# Patient Record
Sex: Female | Born: 1960
Health system: Southern US, Community
[De-identification: ages and names within clinical notes are randomized; demographics above are authoritative.]

## PROBLEM LIST (undated history)

## (undated) DIAGNOSIS — K7460 Unspecified cirrhosis of liver: Secondary | ICD-10-CM

## (undated) DIAGNOSIS — IMO0002 Reserved for concepts with insufficient information to code with codable children: Secondary | ICD-10-CM

## (undated) DIAGNOSIS — I639 Cerebral infarction, unspecified: Secondary | ICD-10-CM

## (undated) DIAGNOSIS — G8929 Other chronic pain: Secondary | ICD-10-CM

## (undated) DIAGNOSIS — R52 Pain, unspecified: Secondary | ICD-10-CM

## (undated) DIAGNOSIS — G4730 Sleep apnea, unspecified: Secondary | ICD-10-CM

## (undated) DIAGNOSIS — R197 Diarrhea, unspecified: Secondary | ICD-10-CM

## (undated) DIAGNOSIS — J449 Chronic obstructive pulmonary disease, unspecified: Secondary | ICD-10-CM

## (undated) DIAGNOSIS — I10 Essential (primary) hypertension: Secondary | ICD-10-CM

## (undated) DIAGNOSIS — S32000A Wedge compression fracture of unspecified lumbar vertebra, initial encounter for closed fracture: Secondary | ICD-10-CM

## (undated) DIAGNOSIS — F32A Depression, unspecified: Secondary | ICD-10-CM

## (undated) DIAGNOSIS — E782 Mixed hyperlipidemia: Secondary | ICD-10-CM

## (undated) DIAGNOSIS — R112 Nausea with vomiting, unspecified: Secondary | ICD-10-CM

## (undated) DIAGNOSIS — E039 Hypothyroidism, unspecified: Secondary | ICD-10-CM

## (undated) DIAGNOSIS — F419 Anxiety disorder, unspecified: Secondary | ICD-10-CM

## (undated) DIAGNOSIS — R109 Unspecified abdominal pain: Secondary | ICD-10-CM

## (undated) DIAGNOSIS — I700 Atherosclerosis of aorta: Secondary | ICD-10-CM

## (undated) DIAGNOSIS — E261 Secondary hyperaldosteronism: Secondary | ICD-10-CM

## (undated) DIAGNOSIS — G43709 Chronic migraine without aura, not intractable, without status migrainosus: Secondary | ICD-10-CM

## (undated) HISTORY — PX: BREAST SURGERY: SHX581

## (undated) HISTORY — DX: Secondary hyperaldosteronism: E26.1

## (undated) HISTORY — DX: Chronic obstructive pulmonary disease, unspecified: J44.9

## (undated) HISTORY — DX: Anxiety disorder, unspecified: F41.9

## (undated) HISTORY — DX: Unspecified cirrhosis of liver: K74.60

## (undated) HISTORY — DX: Mixed hyperlipidemia: E78.2

## (undated) HISTORY — DX: Essential (primary) hypertension: I10

## (undated) HISTORY — DX: Depression, unspecified: F32.A

## (undated) HISTORY — PX: CHOLECYSTECTOMY: SHX55

## (undated) HISTORY — PX: ABDOMINAL HYSTERECTOMY: SHX81

## (undated) HISTORY — DX: Atherosclerosis of aorta: I70.0

## (undated) HISTORY — DX: Wedge compression fracture of unspecified lumbar vertebra, initial encounter for closed fracture: S32.000A

## (undated) HISTORY — PX: ABDOMINAL SURGERY: SHX537

---

## 1998-04-09 ENCOUNTER — Encounter: Admission: RE | Admit: 1998-04-09 | Discharge: 1998-07-08 | Payer: Self-pay

## 1999-03-18 ENCOUNTER — Other Ambulatory Visit: Admission: RE | Admit: 1999-03-18 | Discharge: 1999-03-18 | Payer: Self-pay | Admitting: Obstetrics and Gynecology

## 1999-04-03 ENCOUNTER — Encounter: Payer: Self-pay | Admitting: Obstetrics and Gynecology

## 1999-04-08 ENCOUNTER — Inpatient Hospital Stay (HOSPITAL_COMMUNITY): Admission: RE | Admit: 1999-04-08 | Discharge: 1999-04-10 | Payer: Self-pay | Admitting: Obstetrics and Gynecology

## 1999-09-11 ENCOUNTER — Encounter: Admission: RE | Admit: 1999-09-11 | Discharge: 1999-10-13 | Payer: Self-pay | Admitting: Unknown Physician Specialty

## 2000-12-02 ENCOUNTER — Encounter: Payer: Self-pay | Admitting: Family Medicine

## 2000-12-02 ENCOUNTER — Encounter: Admission: RE | Admit: 2000-12-02 | Discharge: 2000-12-02 | Payer: Self-pay | Admitting: Family Medicine

## 2002-01-16 ENCOUNTER — Inpatient Hospital Stay (HOSPITAL_COMMUNITY): Admission: AD | Admit: 2002-01-16 | Discharge: 2002-01-18 | Payer: Self-pay | Admitting: Internal Medicine

## 2002-01-17 ENCOUNTER — Encounter: Payer: Self-pay | Admitting: Internal Medicine

## 2003-09-22 ENCOUNTER — Encounter: Payer: Self-pay | Admitting: *Deleted

## 2003-09-22 ENCOUNTER — Emergency Department (HOSPITAL_COMMUNITY): Admission: EM | Admit: 2003-09-22 | Discharge: 2003-09-22 | Payer: Self-pay | Admitting: Emergency Medicine

## 2006-07-13 ENCOUNTER — Ambulatory Visit (HOSPITAL_BASED_OUTPATIENT_CLINIC_OR_DEPARTMENT_OTHER): Admission: RE | Admit: 2006-07-13 | Discharge: 2006-07-13 | Payer: Self-pay | Admitting: General Surgery

## 2006-07-13 ENCOUNTER — Encounter (INDEPENDENT_AMBULATORY_CARE_PROVIDER_SITE_OTHER): Payer: Self-pay | Admitting: *Deleted

## 2007-01-23 ENCOUNTER — Ambulatory Visit: Payer: Self-pay | Admitting: Cardiovascular Disease

## 2007-02-07 ENCOUNTER — Ambulatory Visit: Payer: Self-pay

## 2007-02-07 ENCOUNTER — Encounter: Payer: Self-pay | Admitting: Cardiology

## 2009-07-03 ENCOUNTER — Emergency Department (HOSPITAL_COMMUNITY): Admission: EM | Admit: 2009-07-03 | Discharge: 2009-07-03 | Payer: Self-pay | Admitting: Emergency Medicine

## 2011-05-07 NOTE — Discharge Summary (Signed)
[place]. [facility]  Patient:    Deborah Russo, Deborah Russo Visit Number: [id number] MRN: [id number]          Service Type: MED Location: [phone] Attending Physician:  Vincent Peyer Dictated by:   Vena Rua, P.A. Admit Date:  01/16/2002 Discharge Date: 01/18/2002   CC:         Dr. Vicie Mutters, [place], [place], [place]. Olevia Perches, M.D. [facility]   Discharge Summary  ADMISSION DIAGNOSES: 1. Acute abdominal pain with diarrhea, nausea and vomiting and CT findings    about one week prior to admission concerning for ischemic bowel.  Rule out    infectious enteritis, rule out vasculitis.  Rule out irritable bowel    syndrome flare. 2. Chronic abdominal pain with narcotic dependence.  Because of nausea and    vomiting, having difficulty maintaining her oral narcotic regimen and    may be suffering from withdrawal symptoms. 3. Dehydration secondary to acute gastrointestinal illness. 4. Status post cesarean section, cholecystectomy, hysterectomy, and    exploratory laparotomy. 5. Nicotine, smoking tobacco dependence. 6. Hypothyroidism.  DISCHARGE DIAGNOSES: 1. Acute abdominal pain, resolved with pain level at baseline upon discharge. 2. Acute gastrointestinal illness with diarrhea, nausea and vomiting.    Nausea and vomiting resolved.  Diarrhea continues albeit at a reduced    level. 3. Leukocytosis, resolved.  The patient treated with IV Cipro during    hospitalization and to continue on a brief course of this upon discharge. 4. Chronic, narcotic requiring abdominal pain.  Followed at the pain clinic    at [facility]. 5. Toxin screen positive for [facility] along with narcotics and benzodiazepines.    The latter two are explained by her normal medical regimen.  PROCEDURES:  None.  CONSULTATIONS:  None.  BRIEF HISTORY:  Deborah Russo is a 50 year old white female who is followed by Dr. Vicie Mutters in  [place], [employer] [employer].  She was referred on January 16, 2002, for evaluation by Lowella Bandy. Olevia Perches, M.D., because of about a 10 to 12-day history of increasing abdominal pain with nausea, vomiting and nonbloody diarrheal stools. She had chills but no fever.  Over the course of the illness, her ability to keep p.o.s in her stomach had progressively decreased to the point where every time she ate, she was getting vomiting and thus she became afraid to eat.  She became progressively weaker.  Left-sided abdominal discomfort is present and she was having general malaise. Diarrheal stools were about two to three times a day.  She had become unable to work because of her symptoms.  On January 09, 2002, she was evaluated at [facility].  She had a CAT scan which showed some subcapsular fluid around the liver and edema in the proximal small-bowel with thickening of the mesentery in the mid abdomen of unclear etiology.  A pelvic CT scan had not been performed and there was no mention as to appearance of the colon in this report. She was referred for further evaluation by Dr. Delfin Edis.  Although she had had extensive evaluation by Jeneen Rinks L. Edwards, M.D., about 10 to 11 years ago for work-up of her chronic abdominal pain. At that time, she had ultimately been referred to Dr. Kristie Cowman at [facility], [facility], where she underwent further GI studies and was then referred to the pain clinic where she was followed by Dr. Winfred Burn.  In any event, Dr. Olevia Perches evaluated her and  was concerned that something new was going on, on top of her chronic GI complaints.  Therefore, she admitted her to the hospital for hydration as the prolonged nausea and vomiting had lead to dehydration. She was also set up for repeat CT scan of the abdomen with pelvic imaging as well.  Of note, her white count in [place], [place], on January 15, 2002, was 20.9 in the office and 30.9 at the hospital.  Some of this  may have been owing to some dehydration because her hemoglobin measured 19.7 that same day.  In any event, the patient was admitted for repeat CT scan as well as IV antibiotics and IV antiemetics and analgesics.  Some of the worsening of her nausea and vomiting as well as her chills may have been secondary to the fact that she had not been able to keep down her usual narcotic regimen of twice daily morphine. She may have been suffering withdrawal symptoms as a result of this.  As noted above, the patients urine tox screen was positive for opiates, benzodiazepines, and THC.  The first two are explained by her chronic medical regimen.  However, she tested positive for marijuana.  This was brought to her attention and she did admit to having smoked marijuana a few days prior to admission.  She explains that a friend who smokes pot offered it to her in attempt to relieve her nausea.  Of note, she did say that it was helpful in alleviating the nausea for a short period of time.  She denies chronic use of marijuana, however.  LABORATORY:  Urinalysis was negative for leukocyte esterase, urine nitrites, protein, and ketones; specific gravity was within normal limits.  Erythrocyte sedimentation rate was 9 which is normal.  ANA qualitative was negative. Fecal occult blood testing was negative.  White blood cell count was initially 12.1 and the following day it went to 10.6; hemoglobin was 13.1, hematocrit 37.1, MCV 89.9, platelets 229.  Differential showed there was slight increase in absolute lymphocyte count at 3.6 with normal range of 0.7 to 3.3000.  PT was 13.8, INR 1.1 and PTT 28.  Sodium 136, potassium 2.9 and after correction it measured 3.5.  Chloride 102, CO2 28. Glucose 94.  BUN 5, creatinine 0.6. Albumin was 2.7.  Total bilirubin 0.5, alkaline phosphatase 72, AST 17, and ALT 25.  Toxicology screening was positive for benzodiazepines and opiates. It was also positive for THC or  cannabinoids.  The cannabinoid testing is  positive for use of marijuana in the last five days.  CT SCAN:  Abdominal film showed a small amount of free fluid with nonspecific appearance. The sigmoid colon and rectum were normal in appearance.  No free fluid or mesenteric inflammatory change noted and urinary bladder was unremarkable.  HOSPITAL COURSE:  The patient was admitted to [facility]. [place] were for her to go to [facility] but there were no beds available. She was started on IV fluids of D5 half normal saline.  She received a bolus of 1000 cc prior to the CAT scan in order to assure that she was well hydrated and to reduce risk of acute renal problems owing to the IV dye.  She was able to keep the oral contrast down.  CT scan was performed and was unrevealing as to any specific gastrointestinal problems.  Labs were obtained and the white blood cell count was not nearly as elevated as it had been the prior week in  [place], [place].  She was started on IV Cipro. The following day, her white blood cell count went from 12.1 down to 10.6. Complete chemistries, urinalysis, and CBC were unrevealing.  There was negative ANA, which ruled out the possibility of vasculitis.  The patients morphine was continued at a slightly larger dose of 30 mg twice daily.  At home she takes 25 mg twice daily.  Her Valium was also continued. With the hydration, the patients nausea eased and resolved.  She was able to keep down both clear liquids, oral contrast as well as her medications.  Over the course of the next couple of days, the left-sided abdominal pain subsided to the point where she was nontender on exam.  She was no longer having any nausea and diet was advanced to low fat and low residue type.  She was also given Protonix.  Over the course of her hospitalizations, all of her symptoms resolved except for the diarrhea.  This improved significantly in  terms of its volume.  However, her stools were still loose at the time of discharge.  Stool screening for fecal occult blood was negative.  Stool screening for ova and parasites as well as fecal leukocytes was still pending at the time of this dictation.  The patient was Laurie Panda for return home on hospital day #3.  She was advised that she could return to work the following Monday which would give her another three to four days off from work. She was to continue a course of oral Cipro for five more days.  As to medical follow-up, she had an appointment arranged to see Dr. Olevia Perches on February 13, 2002, in the afternoon.  She already had an appointment to be seen for her bimonthly visits to the pain clinic at [facility] scheduled for March. Besides the addition of Cipro, her incoming medications were unchanged.  They are as follows:  MEDICATIONS AT DISCHARGE: 1. Percocet one to two tablets every six hours for a total of three to four    tablets daily p.r.n. 2. Morphine 25 mg p.o. b.i.d. 3. Unithroid 15 mcg one p.o. q.d. 4. Valium 5 mg p.o. t.i.d. 5. Lomotil as needed. 6. Phenergan 25 mg p.o. q.6h. p.r.n. 7. Imitrex orally as needed for migraines. 8. Cipro 500 mg p.o. b.i.d. for the next five days.  DIET AT DISCHARGE:  Low fiber and bland diet until diarrhea had improved.  DISCHARGE INSTRUCTIONS:  Return to work was [employer] for January 22, 2002.  If she felt she was still unable to return to work, then she could get further work excuses from her primary care physician, Dr. Vicie Mutters. Dictated by: Vena Rua, P.A. Attending Physician:  Vincent Peyer DD:  01/18/02 TD:  01/18/02 Job: [id number]

## 2011-05-07 NOTE — H&P (Signed)
[place]  Patient:    Deborah Russo, Deborah Russo Visit Number: [id number] MRN: [id number]          Service Type: MED Location: [phone] Attending Physician:  Vincent Peyer Dictated by:   Alfredia Ferguson, P.A. Admit Date:  01/16/2002   CC:         Vicie Mutters, M.D., [place], [place]                         History and Physical  DATE OF BIRTH:  1961/11/26  CHIEF COMPLAINT:  Abdominal pain, intractable nausea, vomiting and diarrhea.  HISTORY:  Tayva is a pleasant 50 year old white female who is referred today for initial evaluation to GI by Dr. Vicie Mutters in [place].  The patient has a history of C-section approximately 12 years ago and has had difficulty with chronic abdominal pain ever since that time.  She underwent cholecystectomy 11 years ago and then an exploratory laparotomy with clipping of her cystic duct 10 years ago due to complaints of persistent pain.  She has also had hysterectomy two years ago and is being followed for migraines and hypothyroidism.  Patient relates that she had undergone extensive GI evaluation with Dr. Jeneen Rinks L. Edwards here in [place] 10 or 11 years ago and was eventually referred to Dr. Yvonne Kendall at [facility] for further GI studies.  No definite etiology of her pain was found and she was then referred to the pain clinic at [facility] and has been followed by Dr. Winfred Burn, phone number [phone], over the past several years for a chronic epigastric pain with radiation into her back.  This had initially been managed with Percocet and is now being managed with Percocet and morphine over the past six months.  Patient presents now with an acute illness which started about 11 days ago with diarrhea followed by rather diffuse abdominal discomfort, nausea and vomiting.  Patient had associated chills but no fever and had several nonbloody diarrheal stools.  She says she felt sick for three  or four days and was unable to eat and then her symptoms gradually improved to the point where she was able to eat very light.  The following weekend, which was four days ago, the patient developed recurrence of her symptoms with recurrent abdominal pain which is now more localized to the left abdomen, intractable nausea and vomiting and loose bowel movements.  She has been keeping down very small amounts of liquids and says she is afraid to eat because she has had so much vomiting.  She continues to complain of left-sided abdominal discomfort and in general feels poorly.  She is currently having two to three loose bowel movements per day.  She has been unable to work since onset of her symptoms. She had been seen and evaluated in the emergency room at [facility] and had actually undergone CT scan of the abdomen on January 21st.  We do have a copy of this report and shows a subcapsular fluid collection around the liver, etiology of which was unclear.  There was also edema of the proximal small bowel and some thickening of the mesentery in the mid-abdomen of unclear etiology.  There is no mention on this report of her colon and CT of the pelvis was not done.  Patient has had a couple of different sets of labs done, most recently on January 27th, had CBC done with white count  of 20.9, hemoglobin 16.9, hematocrit of 48.8, platelets of 371,000 and electrolytes yesterday, January 15, 2002, showed a glucose of 123, BUN 8, creatinine 0.9, albumin 4.1, liver function studies normal, amylase of 31.  Patient had a CBC done through the [facility] on January 27th that also showed a WBC of 30.9, hemoglobin of 19.7 and hematocrit of 58.8.  She is seen and evaluated in our office today, felt to be acutely ill and dehydrated and admitted to the hospital for rehydration and further diagnostic evaluation.  The patient has not been on any recent new medications, no recent antibiotics, no travel, no  family members have been ill.  She does report having a similar but less severe episode of abdominal pain, nausea and vomiting about six months ago which resolved on its own.  CURRENT MEDICATIONS: 1. Percocet three to four tablets p.o. q.d. 2. Morphine 25 mg p.o. b.i.d. over the past six months, managed by the pain    clinic at [facility]. 3. Unithroid 15 mcg q.d. 4. Valium 5 mg t.i.d. 5. Lomotil q.4h. p.r.n. over the past week. 6. Phenergan 25 mg q.6h. p.r.n. 7. Imitrex p.r.n. migraine.  ALLERGIES:  PENICILLIN and SULFA which cause rash and hives.  PAST MEDICAL HISTORY:  As outlined above.  SOCIAL HISTORY:  The patient is divorced, has one child, age 14.  She works full-time in a [employer], materials.  She is a smoker of one pack per day.  Denies any ETOH.  FAMILY HISTORY:  Family history is negative for colon cancer, polyps or inflammatory bowel disease.  Father is deceased from a cancer, primary was unclear.  Mother deceased secondary to complications of asthma and diabetes. There is family history of alcohol abuse.  REVIEW OF SYSTEMS:  CARDIOVASCULAR:  Denies any chest pain or anginal symptoms.  PULMONARY:  Negative for cough, shortness of breath or sputum production.  GENITOURINARY:  Negative for dysuria, urgency or frequency.  GI: As outlined above.  PHYSICAL EXAMINATION:  GENERAL:  Patient is a well-developed white female in no acute distress.  She is ill-appearing.  Height is 5 foot 2 inches.  Weight is 198 pounds.  She is alert and oriented x3.  VITAL SIGNS:  Blood pressure 110/70.  Pulse is 96.  HEENT:  Nontraumatic, normocephalic.  EOMI.  PERLA.  Sclerae anicteric.  NECK:  Supple.  CARDIOVASCULAR:  Slightly tachycardic, regular rhythm with S1 and S2.  No murmur, rub or gallop.  PULMONARY:  Clear to P&A.  ABDOMEN:  Soft.  Bowel sounds are hypoactive.  She is very tender, primarily in the left upper quadrant, left mid-quadrant and left lower quadrant.  There is  no guarding or rebound.  No mass or hepatosplenomegaly.  RECTAL:  Heme-negative.   EXTREMITIES:  Without clubbing, cyanosis, or edema.  LABORATORY AND ACCESSORY DATA:  Labs were reviewed.  IMPRESSION: 1. 39 white female with acute abdominal pain, diarrhea, nausea    and vomiting, with CT scan findings one week ago concerning for ischemic    bowel, rule out infectious etiology, rule out vasculitis. 2. Narcotic dependence secondary to chronic abdominal pain syndrome with    recent difficulty maintaining medication regimen. 3. Dehydration secondary to above. 4. Status post cesarean section, cholecystectomy, hysterectomy and exploratory    laparotomy. 5. Smoker.  PLAN:  Patient is admitted to the service of Dr. Lowella Bandy. [place] for IV fluid hydration.  We will continue her usual pain medication regimen with Percocet and morphine.  She will be placed on a  liquid diet.  We will schedule CT scan of the abdomen and pelvis.  She will be covered with IV Cipro, IV Protonix and will probably need further GI workup endoscopically, depending on CT findings. Dictated by:   Alfredia Ferguson, P.A. Attending Physician:  Vincent Peyer DD:  01/16/02 TD:  01/16/02 Job: [id number]

## 2011-05-07 NOTE — Op Note (Signed)
NAMEMECHEL, HAGGARD NO.:  [id number]   MEDICAL RECORD NO.:  [id number]          PATIENT TYPE:  AMB   LOCATION:  [facility]                         FACILITY:  [facility]   PHYSICIAN:  Orson Ape. Weatherly, M.D.DATE OF BIRTH:  Mar 03, 1961   DATE OF PROCEDURE:  07/13/2006  DATE OF DISCHARGE:                                 OPERATIVE REPORT   PREOPERATIVE DIAGNOSIS:  Abscess and probably chronic hidradenitis, left  axilla.   OPERATION:  Drainage of abscess and excision of fat necrosis and lymph  nodes, left axilla.   ANESTHESIA:  General anesthesia.   SURGEON:  Orson Ape. Rise Patience, M.D.   HISTORY:  Deborah Russo is a 50 year old female who is disabled because  of chronic abdominal pain who was seen in the office yesterday after being  referred urgently from Dr. Zachery Dauer office in [facility] where the  P.A. had attempted to drain an abscess in the left axilla. They had  anesthetized the area with about 10 cc of Xylocaine and made a stab wound,  had significant problems with bleeding, no frank pus, and were unable to do  anything else. I saw her in the office probably about 2 hours later. There  was a little 1/4-inch Penrose drain in the area. The area was tender. I  could feel several firm areas that seemed to me more like lymphadenopathy  and not a frank abscess. I talked to Dr. Truitt Leep who was on call, and she  was extremely busy and could not get to her that evening, and since we did  not have any significant active bleeding at that time, we placed on her  doxycycline, pain medication and added her to the OR scheduled for today.  She comes in this morning and says she did not get the doxycycline filled,  and her past history is that she has had a previous drainage of the area  that was described as a cyst or hidradenitis in the left axilla several  years earlier. I gave her a gram of Kefzol preoperatively and then took her  to the operative suite where  under general anesthesia we will excise this  area.   With the patient anesthetized, you could feel a fluctuant area that was  superior to where they had made the little incision. You could see a little  area that obviously had been operated on previously at about the same area  that this was lanced, and I opened the incision probably about a 1-1/2  inches and then tunneled up, opening into a frank abscess that was probably  about 3 cm in size that was kind of foul smelling, and this was cultured  aerobically and anaerobically. There were also numerous areas of fat  necrosis and lymph nodes in the area that was chronically firm and inflamed,  and I elected to go ahead and just surgically debride all of those. Several  little areas required suturing of 4-0 Vicryl for hemostasis, and all of the  inflammatory firm lymph nodes, etc., were all removed. This was all fairly  superficial. It is not deep in the axilla, and after  the area had been  debrided, this will be sent for permanent pathology exam. Thought about  putting maybe a Jackson-Pratt drain, because of the frank purulence, I think  it would be better to put a Penrose and pack the area with Betadine-soaked  packing gauze. I partially closed the incision, put a 3/4-inch Penrose drain  laterally, stitched that to the skin up in the deep area, and then a couple  of stitches on the superior side to kind of bring the skin edges together  and then use the Iodoform gauze, probably about a 20-inch segment packed up  in the actual cavity itself. This was also sutured in the skin so it will  not retract, and I will see her in the office in 48 hours. This will need  warm soaks and kind of gradually allowing it to heal from the inside. We  will what the cultures are growing, but I think the doxycycline will be the  best choice at this time. I think this is kind of a chronic hidradenitis-  type infection that has been smoldering for some time, but  we will await  the final pathology report. The patient tolerated the procedure nicely and  was extubated and sent to the recovery room in stable postoperative  condition. She is chronic Valium and Percocet for chronic abdominal pain,  and we will discontinue those medications in addition to the doxycycline.           ______________________________  Orson Ape. Rise Patience, M.D.     WJW/[id number]  D:  07/13/2006  T:  07/13/2006  Job:  [id number]   cc:   Chipper Herb, M.D.  Fax: [phone]

## 2011-05-07 NOTE — Assessment & Plan Note (Signed)
[place]                            CARDIOLOGY OFFICE NOTE   ATHENIA, RYS                    MRN:          [id number]  DATE:01/23/2007                            DOB:          1961/04/27    DATE OF VISIT:  January 23, 2007.   REASON FOR VISIT:  Deborah Russo is a delightful 50 year old patient  referred for chest pain and shortness of breath.   HISTORY OF PRESENT ILLNESS:  Her chest pain is atypical, it is right  sided, it is not necessarily exertional, it does radiate to her jaw.   She has had this over the last few months. It has not changed in  intensity.  She has multiple coronary risk factors including  hyperlipidemia, hypertension, positive family history.   The patient has not been treated for hyperlipidemia yet.  Her LDL  cholesterol was elevated at 189.   In talking to her she does have somewhat of a poor diet.  She does not  watch her salt intake.  She has been compliant with her medications.  She does get occasional exertional dyspnea as well.  She is a long-time  smoker. She has never had formal PFT's.  She has not had any significant  weight loss, cough or sputum production.   REVIEW OF SYSTEMS:  Is remarkable for some chronic fatigue.  She has  thyroid disease that is monitored regularly and she is on replacement.  She is not on any medicine for her blood sugar, but appears to be a  borderline diabetic who may need more diet therapy.   ALLERGIES:  She is allergic to SULFA and PENICILLIN>   FAMILY HISTORY:  Remarkable for asthma in her mother and cancer in her  Dad.   SOCIAL HISTORY:  The patient is divorced. She has one 24 year old  daughter who is going to school at [employer].   PAST SURGICAL HISTORY:  She has had multiple previous surgeries  including a Cesarean section which was followed up by multiple abdominal  exploration's and cholecystectomy.  She has chronic pain syndrome from  this and has seen people up at  [facility]   She is on disability for migraines and her chronic pain in her abdomen.   MEDICATIONS:  1. Benicar 25 daily.  2. Synthroid 175 mcg daily.  3. Zegrid 40 mg daily.  4. Valium.  5. OxyContin.  6. Percocet.  7. Imitrex 100 mg daily.   PHYSICAL EXAMINATION:  GENERAL APPEARANCE:  She is overweight.  She has  nicotine on the breath.  VITAL SIGNS:  Blood pressure is 140/80, pulse is 80 and regular.  HEENT: Normal.  NECK: Carotids have no bruits.  LUNGS:  Clear.  HEART:  S1, S2, normal heart sounds.  ABDOMEN:  Benign.  She has a large midline scar.  EXTREMITIES:  Distal pulses are intact with no edema.   CLINICAL DATA:  Electrocardiogram is normal, performed at [facility].   IMPRESSION:  Atypical chest pain and exertional dyspnea, likely related  to deconditioning and smoking.   PLAN:  1. Given her multiple risk factors, she  should have a stress Myoview      study.  We will arrange this in the next week or so.  She will also      have a 2-dimensional echocardiogram to assess LV and RV function      given her dyspnea.  2. The patient will continue her current medications for blood      pressure.  It seems well controlled.  3. I gave her a prescription for Lipitor 40 mg a day.  She will      followup with Dr. Elana Alm in regards to this.  She has had some chronic      abdominal problems and takes a lot of Percocet.  I noted that her      alkaline phosphatase was mildly elevated at 158 as was her GGT at      93.  She will need close followup of her liver function tests up in      [place], after starting her Statin drug.  I would check her LFT's      again in six to eight weeks and then in three months and then six      months.  4. We will see her on a prn basis, unless her stress test or echo are      abnormal.     Deborah Salina C. Johnsie Cancel, MD, [facility]  Electronically Signed    PCN/MedQ  DD: 01/23/2007  DT: 01/23/2007  Job #: [id number]   cc:   Genevie Ann, MD

## 2012-02-11 ENCOUNTER — Encounter: Payer: Self-pay | Admitting: Internal Medicine

## 2012-04-04 DIAGNOSIS — E039 Hypothyroidism, unspecified: Secondary | ICD-10-CM | POA: Diagnosis not present

## 2012-09-23 ENCOUNTER — Emergency Department (HOSPITAL_COMMUNITY)
Admission: EM | Admit: 2012-09-23 | Discharge: 2012-09-23 | Disposition: A | Discharge status: Home / Self Care | Payer: Medicare Other | Attending: Emergency Medicine | Admitting: Emergency Medicine

## 2012-09-23 ENCOUNTER — Encounter (HOSPITAL_COMMUNITY): Payer: Self-pay

## 2012-09-23 DIAGNOSIS — Z9089 Acquired absence of other organs: Secondary | ICD-10-CM | POA: Diagnosis not present

## 2012-09-23 DIAGNOSIS — F17200 Nicotine dependence, unspecified, uncomplicated: Secondary | ICD-10-CM | POA: Insufficient documentation

## 2012-09-23 DIAGNOSIS — R21 Rash and other nonspecific skin eruption: Secondary | ICD-10-CM | POA: Diagnosis not present

## 2012-09-23 DIAGNOSIS — G8929 Other chronic pain: Secondary | ICD-10-CM | POA: Insufficient documentation

## 2012-09-23 DIAGNOSIS — Z90710 Acquired absence of both cervix and uterus: Secondary | ICD-10-CM | POA: Diagnosis not present

## 2012-09-23 DIAGNOSIS — L259 Unspecified contact dermatitis, unspecified cause: Secondary | ICD-10-CM | POA: Diagnosis not present

## 2012-09-23 DIAGNOSIS — Z882 Allergy status to sulfonamides status: Secondary | ICD-10-CM | POA: Diagnosis not present

## 2012-09-23 DIAGNOSIS — Z880 Allergy status to penicillin: Secondary | ICD-10-CM | POA: Insufficient documentation

## 2012-09-23 DIAGNOSIS — L309 Dermatitis, unspecified: Secondary | ICD-10-CM

## 2012-09-23 HISTORY — DX: Chronic migraine without aura, not intractable, without status migrainosus: G43.709

## 2012-09-23 HISTORY — DX: Reserved for concepts with insufficient information to code with codable children: IMO0002

## 2012-09-23 HISTORY — DX: Other chronic pain: G89.29

## 2012-09-23 MED ORDER — HYDROXYZINE HCL 25 MG PO TABS
ORAL_TABLET | ORAL | Status: AC
  Filled 2012-09-23: qty 1

## 2012-09-23 MED ORDER — HYDROXYZINE HCL 50 MG/ML IM SOLN: 50.0000 mg | Freq: Once | INTRAMUSCULAR | Status: DC

## 2012-09-23 MED ORDER — HYDROXYZINE PAMOATE 25 MG PO CAPS: ORAL_CAPSULE | ORAL | Status: DC

## 2012-09-23 MED ORDER — TRIAMCINOLONE ACETONIDE 0.5 % EX OINT: TOPICAL_OINTMENT | Freq: Two times a day (BID) | CUTANEOUS | Status: DC

## 2012-09-23 MED ORDER — PREDNISONE 10 MG PO TABS: ORAL_TABLET | ORAL | Status: DC

## 2012-09-23 MED ORDER — HYDROXYZINE HCL 25 MG PO TABS
25.0000 mg | ORAL_TABLET | Freq: Once | ORAL | Status: AC
  Administered 2012-09-23: 25 mg via ORAL

## 2012-09-23 MED ORDER — METHYLPREDNISOLONE SODIUM SUCC 125 MG IJ SOLR
125.0000 mg | Freq: Once | INTRAMUSCULAR | Status: AC
  Administered 2012-09-23: 125 mg via INTRAMUSCULAR
  Filled 2012-09-23: qty 2

## 2012-09-23 NOTE — ED Provider Notes (Signed)
History     CSN: [id number]  Arrival date & time 09/23/12  1311   First MD Initiated Contact with Patient 09/23/12 1427      Chief Complaint  Patient presents with  . Rash    (Consider location/radiation/quality/duration/timing/severity/associated sxs/prior treatment) HPI Comments: Pt of an itching rash that comes and goes. This episode started after a cold.  The rash is spread to the lower ext. , the back of the knees, and around the neck.  Patient is a 51 y.o. female presenting with rash. The history is provided by the patient.  Rash  This is a recurrent problem. The current episode started more than 1 week ago. The problem has been gradually worsening. Associated with: recent URI. There has been no fever. The rash is present on the right lower leg and left lower leg. The pain is moderate. The pain has been fluctuating since onset. Associated symptoms include itching. She has tried antihistamines for the symptoms. The treatment provided no relief.    Past Medical History  Diagnosis Date  . Chronic pain   . Chronic migraine     Past Surgical History  Procedure Date  . Cesarean section   . Cholecystectomy   . Abdominal surgery   . Abdominal hysterectomy     No family history on file.  History  Substance Use Topics  . Smoking status: Current Every Day Smoker    Types: Cigarettes  . Smokeless tobacco: Not on file  . Alcohol Use: No    OB History    Grav Para Term Preterm Abortions TAB SAB Ect Mult Living                  Review of Systems  Constitutional: Negative for activity change.       All ROS Neg except as noted in HPI  HENT: Positive for congestion. Negative for nosebleeds and neck pain.   Eyes: Negative for photophobia and discharge.  Respiratory: Negative for cough, shortness of breath and wheezing.   Cardiovascular: Negative for chest pain and palpitations.  Gastrointestinal: Negative for abdominal pain and blood in stool.  Genitourinary: Negative  for dysuria, frequency and hematuria.  Musculoskeletal: Negative for back pain and arthralgias.  Skin: Positive for itching and rash.  Neurological: Negative for dizziness, seizures and speech difficulty.  Psychiatric/Behavioral: Negative for hallucinations and confusion.    Allergies  Penicillins and Sulfa antibiotics  Home Medications   Current Outpatient Rx  Name Route Sig Dispense Refill  . DIAZEPAM 5 MG PO TABS Oral Take 5 mg by mouth 3 (three) times daily.    Marland Kitchen DIPHENHYDRAMINE HCL 25 MG PO CAPS Oral Take 25 mg by mouth every 6 (six) hours as needed. For itching    . LEVOTHYROXINE SODIUM 175 MCG PO TABS Oral Take 175 mcg by mouth daily.    . OXYCODONE HCL ER 80 MG PO TB12 Oral Take 80 mg by mouth every 12 (twelve) hours.      BP 186/85  Pulse 104  Temp 98.2 F (36.8 C) (Oral)  Resp 20  Ht 5' 2.5" (1.588 m)  Wt 195 lb (88.451 kg)  BMI 35.10 kg/m2  SpO2 100%  Physical Exam  Nursing note and vitals reviewed. Constitutional: She is oriented to person, place, and time. She appears well-developed and well-nourished.  Non-toxic appearance.  HENT:  Head: Normocephalic.  Right Ear: Tympanic membrane and external ear normal.  Left Ear: Tympanic membrane and external ear normal.  Eyes: EOM and lids are normal.  Pupils are equal, round, and reactive to light.  Neck: Normal range of motion. Neck supple. Carotid bruit is not present.       Red streaking scaling  rash of the neck.  Cardiovascular: Normal rate, regular rhythm, normal heart sounds, intact distal pulses and normal pulses.   Pulmonary/Chest: Breath sounds normal. No respiratory distress.  Abdominal: Soft. Bowel sounds are normal. There is no tenderness. There is no guarding.  Musculoskeletal: Normal range of motion.       Dry scaling rash behind the knee and left anticubital.  Lymphadenopathy:       Head (right side): No submandibular adenopathy present.       Head (left side): No submandibular adenopathy present.     She has no cervical adenopathy.  Neurological: She is alert and oriented to person, place, and time. She has normal strength. No cranial nerve deficit or sensory deficit.  Skin: Skin is warm and dry.  Psychiatric: She has a normal mood and affect. Her speech is normal.    ED Course  Procedures (including critical care time)  Labs Reviewed - No data to display No results found.  Pulse ox 100% on room air. WNL by my interpretation. No diagnosis found.    MDM  I have reviewed nursing notes, vital signs, and all appropriate lab and imaging results for this patient. Pt has a rash that comes and goes. This episode followed an uri. Itching increasing. No rash or itching between the web spaces, around the elastic areas of underwear. No fever. Suspect eczema. Rx for vistaril, deltasone, and triamcinolone given to the patient.       Lenox Ahr, [place] 10/01/12 [phone]

## 2012-09-23 NOTE — ED Notes (Signed)
Rash that started about 1 month ago to right lower leg and now has spread. Itching at times.

## 2012-09-23 NOTE — ED Notes (Signed)
Pt presents with small fine body rash, x 1 week. Pt states head and body itches. Pt educated on the cleaning house secondary to body lice. Pt also c/o sore throat. NAD noted.

## 2012-10-02 NOTE — ED Provider Notes (Signed)
Medical screening examination/treatment/procedure(s) were performed by non-physician practitioner and as supervising physician I was immediately available for consultation/collaboration.   Maudry Diego, MD 10/02/12 [phone]

## 2012-10-10 ENCOUNTER — Encounter: Payer: Self-pay | Admitting: Internal Medicine

## 2013-03-28 ENCOUNTER — Encounter (INDEPENDENT_AMBULATORY_CARE_PROVIDER_SITE_OTHER): Payer: Self-pay | Admitting: General Surgery

## 2013-03-28 ENCOUNTER — Ambulatory Visit (INDEPENDENT_AMBULATORY_CARE_PROVIDER_SITE_OTHER): Payer: Medicare Other | Admitting: General Surgery

## 2013-03-28 VITALS — BP 140/88 | HR 102 | Temp 98.7°F | Resp 18 | Ht 62.0 in | Wt 198.0 lb

## 2013-03-28 DIAGNOSIS — IMO0002 Reserved for concepts with insufficient information to code with codable children: Secondary | ICD-10-CM | POA: Diagnosis not present

## 2013-03-28 DIAGNOSIS — L02412 Cutaneous abscess of left axilla: Secondary | ICD-10-CM | POA: Insufficient documentation

## 2013-03-28 NOTE — Patient Instructions (Signed)
We opened up the left axillary abscess in the office today. It will drain some for about a week.  Change the bandage 3 or 4 times a day to collect the drainage.Do not to put any ointment or cream on the open wound.  You may take a shower once or twice a day.  Take the antibiotics until they are all gone.  Use the pain medicine, if necessary  Return to see Dr. Dalbert Batman in 2-3 weeks, sooner if you get worse.

## 2013-03-28 NOTE — Progress Notes (Signed)
Patient ID: Deborah Russo, female   DOB: 11/10/1961, 52 y.o.   MRN: [id number]  Chief Complaint  Patient presents with  . Follow-up    infected Lt br axilla    HPI Deborah Russo is a 52 y.o. female.  She is self-referred for a recurrent left axillary abscess.  This patient is disabled because of migraine headaches. She is followed at [facility] for chronic pain in her sternum and rib cage. She does not have a primary care physician. She states that Dr. Rise Patience performed incision and drainage of an abscess in her left axilla 5 years ago. She has not seen [place] in 5 years. She says that this will intermittently swell, draine, and then heal.. It does not seem to be healing this time and has been bothering her for 5 days. She continues to smoke.  HPI  Past Medical History  Diagnosis Date  . Chronic pain   . Chronic migraine     Past Surgical History  Procedure Laterality Date  . Cesarean section    . Cholecystectomy    . Abdominal surgery    . Abdominal hysterectomy      Family History  Problem Relation Age of Onset  . Asthma Mother   . Cancer Father     Social History History  Substance Use Topics  . Smoking status: Current Every Day Smoker -- 1.00 packs/day    Types: Cigarettes  . Smokeless tobacco: Never Used  . Alcohol Use: No    Allergies  Allergen Reactions  . Penicillins Hives  . Sulfa Antibiotics Hives    Current Outpatient Prescriptions  Medication Sig Dispense Refill  . diazepam (VALIUM) 5 MG tablet Take 5 mg by mouth 3 (three) times daily.      . diphenhydrAMINE (BENADRYL) 25 mg capsule Take 25 mg by mouth every 6 (six) hours as needed. For itching      . levothyroxine (SYNTHROID, LEVOTHROID) 175 MCG tablet Take 175 mcg by mouth daily.      Marland Kitchen oxyCODONE (OXYCONTIN) 80 MG 12 hr tablet Take 80 mg by mouth every 12 (twelve) hours.      . SUMAtriptan (IMITREX) 100 MG tablet Take 100 mg by mouth every 2 (two) hours as needed for migraine.        No current facility-administered medications for this visit.    Review of Systems Review of Systems  Constitutional: Negative for fever, chills and unexpected weight change.  HENT: Negative for hearing loss, congestion, sore throat, trouble swallowing and voice change.   Eyes: Negative for visual disturbance.  Respiratory: Negative for cough and wheezing.   Cardiovascular: Positive for chest pain. Negative for palpitations and leg swelling.  Gastrointestinal: Negative for nausea, vomiting, abdominal pain, diarrhea, constipation, blood in stool, abdominal distention and anal bleeding.  Genitourinary: Negative for hematuria, vaginal bleeding and difficulty urinating.  Musculoskeletal: Negative for arthralgias.  Skin: Positive for wound. Negative for rash.  Neurological: Positive for headaches. Negative for seizures and syncope.  Hematological: Negative for adenopathy. Does not bruise/bleed easily.  Psychiatric/Behavioral: Negative for confusion.    Blood pressure 140/88, pulse 102, temperature 98.7 F (37.1 C), temperature source Temporal, resp. rate 18, height 5' 2"  (1.575 m), weight 198 lb (89.812 kg).  Physical Exam Physical Exam  Constitutional: She is oriented to person, place, and time. She appears well-developed and well-nourished. No distress.  HENT:  Head: Normocephalic and atraumatic.  Neck: Neck supple.  Cardiovascular: Normal rate, regular rhythm, normal heart sounds and intact  distal pulses.   No murmur heard. Pulmonary/Chest: Effort normal and breath sounds normal. No respiratory distress. She has no wheezes. She has no rales. She exhibits no tenderness.  Musculoskeletal: She exhibits no edema and no tenderness.  Neurological: She is alert and oriented to person, place, and time. She exhibits normal muscle tone. Coordination normal.  Skin: Skin is warm. No rash noted. She is not diaphoretic. No erythema. No pallor.  There is a tender draining wound in the left  axilla. Not much of a mass. Hyperpigmentation. Old scar present. This was anesthetized with 1% Xylocaine, the wound was opened up and incised with a knife. There was a little bit of purulence.  I opened this up completely. Dry gauze bandage placed.  Psychiatric: She has a normal mood and affect. Her behavior is normal. Judgment and thought content normal.    Data Reviewed None available. Self-referred. Sees no other physicians.  Assessment    Recurrent left axillary abscess. Not sure whether this is a hydradenitis or just chronic soft tissue infection.     Plan    Incision and drainage performed today.  Clindamycin 600 mg 4 times a day for 8 days  Vicodin, 30 tablets given  Wound care instructions detailed and written  Return to see me in 2-3 weeks.       Edsel Petrin. Dalbert Batman, M.D., [facility] General and Minimally invasive Surgery Breast and Colorectal Surgery Office:   [phone] Pager:   [phone]  03/28/2013, 5:00 PM

## 2013-03-29 ENCOUNTER — Telehealth (INDEPENDENT_AMBULATORY_CARE_PROVIDER_SITE_OTHER): Payer: Self-pay

## 2013-03-29 NOTE — Telephone Encounter (Signed)
Kathy at [facility] called to verify dosage on antibiotic written yesterday. Clindamycin doseage in dictated not from Dr Dalbert Batman verified with pharmacist.

## 2013-04-16 ENCOUNTER — Encounter (INDEPENDENT_AMBULATORY_CARE_PROVIDER_SITE_OTHER): Payer: Medicare Other | Admitting: General Surgery

## 2013-05-03 ENCOUNTER — Ambulatory Visit (INDEPENDENT_AMBULATORY_CARE_PROVIDER_SITE_OTHER): Payer: Medicare Other | Admitting: General Surgery

## 2013-05-03 ENCOUNTER — Encounter (INDEPENDENT_AMBULATORY_CARE_PROVIDER_SITE_OTHER): Payer: Self-pay | Admitting: General Surgery

## 2013-05-03 VITALS — BP 118/78 | HR 78 | Temp 99.0°F | Resp 20 | Ht 63.0 in | Wt 207.0 lb

## 2013-05-03 DIAGNOSIS — IMO0002 Reserved for concepts with insufficient information to code with codable children: Secondary | ICD-10-CM

## 2013-05-03 DIAGNOSIS — L02412 Cutaneous abscess of left axilla: Secondary | ICD-10-CM

## 2013-05-03 NOTE — Progress Notes (Signed)
Patient ID: Deborah Russo, female   DOB: [birth year], 52 y.o.   MRN: [id number] History: This patient underwent incision and drainage of a left axillary abscess on 03/28/2013. She was placed on clindamycin. The wound healed in quickly and then opened up a little bit and drained some more pus and now has almost completely healed. No pain.  Exam: Patient is in no distress. Strong odor  of tobacco. Theincision has essentially healed. There is 1 pinhole opening area but there is no purulence, no cellulitis, no tenderness  Assessment: Recurrent axillary abscess, recovering uneventfully following incision and drainage  Plan: Return to see [place] if further problems arise. Wound care discussed.   Edsel Petrin. Dalbert Batman, M.D., [facility] General and Minimally invasive Surgery Breast and Colorectal Surgery Office:   [phone] Pager:   [phone]

## 2013-05-03 NOTE — Patient Instructions (Signed)
The wound in your left axilla has almost completely healed. There is no infection or drainage today. There is no indication for further surgery.  Keep the wound clean. You may shower normally.  You are  strongly encouraged to stop smoking.  Return to see [place] if further problems arise.

## 2013-08-29 DIAGNOSIS — E039 Hypothyroidism, unspecified: Secondary | ICD-10-CM | POA: Diagnosis not present

## 2013-08-29 DIAGNOSIS — J328 Other chronic sinusitis: Secondary | ICD-10-CM | POA: Diagnosis not present

## 2013-09-18 ENCOUNTER — Encounter (HOSPITAL_COMMUNITY): Payer: Self-pay | Admitting: *Deleted

## 2013-09-18 ENCOUNTER — Emergency Department (HOSPITAL_COMMUNITY)
Admission: EM | Admit: 2013-09-18 | Discharge: 2013-09-18 | Disposition: A | Discharge status: Home / Self Care | Payer: Medicare Other | Attending: Emergency Medicine | Admitting: Emergency Medicine

## 2013-09-18 DIAGNOSIS — K2970 Gastritis, unspecified, without bleeding: Secondary | ICD-10-CM

## 2013-09-18 DIAGNOSIS — R1013 Epigastric pain: Secondary | ICD-10-CM | POA: Diagnosis not present

## 2013-09-18 DIAGNOSIS — G43709 Chronic migraine without aura, not intractable, without status migrainosus: Secondary | ICD-10-CM | POA: Diagnosis not present

## 2013-09-18 DIAGNOSIS — K279 Peptic ulcer, site unspecified, unspecified as acute or chronic, without hemorrhage or perforation: Secondary | ICD-10-CM

## 2013-09-18 DIAGNOSIS — Z79899 Other long term (current) drug therapy: Secondary | ICD-10-CM | POA: Insufficient documentation

## 2013-09-18 DIAGNOSIS — Z880 Allergy status to penicillin: Secondary | ICD-10-CM | POA: Diagnosis not present

## 2013-09-18 DIAGNOSIS — K7689 Other specified diseases of liver: Secondary | ICD-10-CM | POA: Diagnosis not present

## 2013-09-18 DIAGNOSIS — F17200 Nicotine dependence, unspecified, uncomplicated: Secondary | ICD-10-CM | POA: Diagnosis not present

## 2013-09-18 DIAGNOSIS — G8929 Other chronic pain: Secondary | ICD-10-CM | POA: Diagnosis not present

## 2013-09-18 DIAGNOSIS — R110 Nausea: Secondary | ICD-10-CM | POA: Diagnosis not present

## 2013-09-18 DIAGNOSIS — K2900 Acute gastritis without bleeding: Secondary | ICD-10-CM | POA: Diagnosis not present

## 2013-09-18 LAB — CBC WITH DIFFERENTIAL/PLATELET
Basophils Absolute: 0 10*3/uL (ref 0.0–0.1)
Eosinophils Absolute: 0.1 10*3/uL (ref 0.0–0.7)
HCT: 36.9 % (ref 36.0–46.0)
Hemoglobin: 12.8 g/dL (ref 12.0–15.0)
Lymphocytes Relative: 35 % (ref 12–46)
MCHC: 34.7 g/dL (ref 30.0–36.0)
Monocytes Relative: 5 % (ref 3–12)
Neutro Abs: 5.3 10*3/uL (ref 1.7–7.7)
Neutrophils Relative %: 59 % (ref 43–77)
RDW: 15.5 % (ref 11.5–15.5)
WBC: 8.9 10*3/uL (ref 4.0–10.5)

## 2013-09-18 LAB — COMPREHENSIVE METABOLIC PANEL
ALT: 17 U/L (ref 0–35)
AST: 17 U/L (ref 0–37)
Albumin: 4.7 g/dL (ref 3.5–5.2)
Alkaline Phosphatase: 94 U/L (ref 39–117)
BUN: 11 mg/dL (ref 6–23)
Chloride: 99 mEq/L (ref 96–112)
Potassium: 3.7 mEq/L (ref 3.5–5.1)
Sodium: 138 mEq/L (ref 135–145)
Total Bilirubin: 0.5 mg/dL (ref 0.3–1.2)
Total Protein: 8.7 g/dL — ABNORMAL HIGH (ref 6.0–8.3)

## 2013-09-18 LAB — LIPASE, BLOOD: Lipase: 24 U/L (ref 11–59)

## 2013-09-18 MED ORDER — MORPHINE SULFATE 4 MG/ML IJ SOLN
4.0000 mg | INTRAMUSCULAR | Status: DC | PRN
  Administered 2013-09-18: 4 mg via INTRAVENOUS
  Filled 2013-09-18: qty 1

## 2013-09-18 MED ORDER — ONDANSETRON HCL 4 MG/2ML IJ SOLN
4.0000 mg | Freq: Once | INTRAMUSCULAR | Status: AC
  Administered 2013-09-18: 4 mg via INTRAVENOUS
  Filled 2013-09-18: qty 2

## 2013-09-18 MED ORDER — OMEPRAZOLE 20 MG PO CPDR: 20.0000 mg | DELAYED_RELEASE_CAPSULE | Freq: Two times a day (BID) | ORAL | Status: DC

## 2013-09-18 MED ORDER — SUCRALFATE 1 G PO TABS: 1.0000 g | ORAL_TABLET | Freq: Four times a day (QID) | ORAL | Status: DC

## 2013-09-18 MED ORDER — HYDROCODONE-ACETAMINOPHEN 5-325 MG PO TABS: 1.0000 | ORAL_TABLET | ORAL | Status: DC | PRN

## 2013-09-18 MED ORDER — OXYCODONE HCL ER 20 MG PO T12A: 20.0000 mg | EXTENDED_RELEASE_TABLET | Freq: Two times a day (BID) | ORAL | Status: DC

## 2013-09-18 MED ORDER — GI COCKTAIL ~~LOC~~
30.0000 mL | Freq: Once | ORAL | Status: AC
  Administered 2013-09-18: 30 mL via ORAL
  Filled 2013-09-18: qty 30

## 2013-09-18 MED ORDER — ONDANSETRON HCL 4 MG PO TABS: 4.0000 mg | ORAL_TABLET | Freq: Four times a day (QID) | ORAL | Status: DC

## 2013-09-18 MED ORDER — PANTOPRAZOLE SODIUM 40 MG IV SOLR
40.0000 mg | Freq: Once | INTRAVENOUS | Status: AC
  Administered 2013-09-18: 40 mg via INTRAVENOUS
  Filled 2013-09-18: qty 40

## 2013-09-18 NOTE — ED Provider Notes (Signed)
CSN: [id number]     Arrival date & time 09/18/13  1525 History  This chart was scribed for No att. providers found by Maree Erie, ED Scribe. The patient was seen in room APA05/APA05. Patient's care was started at 11:21 PM.    Chief Complaint  Patient presents with  . Abdominal Pain    Patient is a 52 y.o. female presenting with abdominal pain. The history is provided by the patient. No language interpreter was used.  Abdominal Pain Associated symptoms: diarrhea, nausea and vomiting   Associated symptoms: no chest pain, no chills, no cough, no dysuria, no fatigue, no fever, no hematuria, no shortness of breath and no sore throat     HPI Comments: Deborah Russo is a 52 y.o. female who presents to the Emergency Department complaining of constant epigastric abdominal pain that worsened two weeks ago. She has chronic epigastric pain and has been seeing a pain manager for 24 years, but states this episode of pain is different. She is prescribed Oxycontin for the pain that she takes everyday. She describes the pain as sharp and stabbing. The pain radiates straight through to her back. It is worsened with eating. She reports taking antiacids for the pain with mild relief. She complains of nausea, emesis, diarrhea and lack of appetite. She denies melena, hematemesis, headache, fever, chest pain, shortness of breath, dysuria, hematuria, frequency. She is a current smoker. She denies taking any anti-inflammatory medications on a regular basis. She drinks coffee occasionally. She has a past history of an ulcer.  Past Medical History  Diagnosis Date  . Chronic pain   . Chronic migraine    Past Surgical History  Procedure Laterality Date  . Cesarean section    . Cholecystectomy    . Abdominal hysterectomy    . Abdominal surgery     Family History  Problem Relation Age of Onset  . Asthma Mother   . Cancer Father    History  Substance Use Topics  . Smoking status: Current Every Day  Smoker -- 1.00 packs/day    Types: Cigarettes  . Smokeless tobacco: Never Used  . Alcohol Use: No   OB History   Grav Para Term Preterm Abortions TAB SAB Ect Mult Living                 Review of Systems  Constitutional: Positive for appetite change. Negative for fever, chills, diaphoresis and fatigue.  HENT: Negative for sore throat, mouth sores and trouble swallowing.   Eyes: Negative for visual disturbance.  Respiratory: Negative for cough, chest tightness, shortness of breath and wheezing.   Cardiovascular: Negative for chest pain.  Gastrointestinal: Positive for nausea, vomiting, abdominal pain and diarrhea. Negative for abdominal distention.  Endocrine: Negative for polydipsia, polyphagia and polyuria.  Genitourinary: Negative for dysuria, frequency and hematuria.  Musculoskeletal: Negative for gait problem.  Skin: Negative for color change, pallor and rash.  Neurological: Negative for dizziness, syncope, light-headedness and headaches.  Hematological: Does not bruise/bleed easily.  Psychiatric/Behavioral: Negative for behavioral problems and confusion.    Allergies  Penicillins and Sulfa antibiotics  Home Medications   Current Outpatient Rx  Name  Route  Sig  Dispense  Refill  . diazepam (VALIUM) 5 MG tablet   Oral   Take 5 mg by mouth 3 (three) times daily.         . diphenhydrAMINE (BENADRYL) 25 mg capsule   Oral   Take 25 mg by mouth every 6 (six) hours as needed. For  itching         . levothyroxine (SYNTHROID, LEVOTHROID) 125 MCG tablet   Oral   Take 125 mcg by mouth daily before breakfast.         . oxyCODONE (OXYCONTIN) 80 MG 12 hr tablet   Oral   Take 80 mg by mouth every 12 (twelve) hours.         . SUMAtriptan (IMITREX) 100 MG tablet   Oral   Take 100 mg by mouth every 2 (two) hours as needed for migraine.         Marland Kitchen omeprazole (PRILOSEC) 20 MG capsule   Oral   Take 1 capsule (20 mg total) by mouth 2 (two) times daily.   60 capsule    0   . ondansetron (ZOFRAN) 4 MG tablet   Oral   Take 1 tablet (4 mg total) by mouth every 6 (six) hours.   12 tablet   0   . OxyCODONE (OXYCONTIN) 20 mg T12A 12 hr tablet   Oral   Take 1 tablet (20 mg total) by mouth every 12 (twelve) hours.   4 tablet   0   . OxyCODONE (OXYCONTIN) 20 mg T12A 12 hr tablet   Oral   Take 1 tablet (20 mg total) by mouth every 12 (twelve) hours.   4 tablet   0   . sucralfate (CARAFATE) 1 G tablet   Oral   Take 1 tablet (1 g total) by mouth 4 (four) times daily.   60 tablet   0    Triage Vitals: BP 166/86  Pulse 94  Temp(Src) 98.2 F (36.8 C) (Oral)  Resp 20  Ht 5' 3"  (1.6 m)  Wt 200 lb (90.719 kg)  BMI 35.44 kg/m2  SpO2 97%  Physical Exam  Constitutional: She is oriented to person, place, and time. She appears well-developed and well-nourished. No distress.  HENT:  Head: Normocephalic.  Eyes: Conjunctivae are normal. Pupils are equal, round, and reactive to light. No scleral icterus.  Small cholesterol deposit present near left eye.  Neck: Normal range of motion. Neck supple. No thyromegaly present.  Cardiovascular: Normal rate, regular rhythm and intact distal pulses.  Exam reveals no gallop and no friction rub.   No murmur heard. Pulmonary/Chest: Effort normal and breath sounds normal. No respiratory distress. She has no wheezes. She has no rales.  Abdominal: Soft. Bowel sounds are normal. She exhibits no distension. There is tenderness. There is no rebound.  Tenderness to palpation in epigastric region, slightly less in right upper quadrant.  Musculoskeletal: Normal range of motion.  Neurological: She is alert and oriented to person, place, and time.  Skin: Skin is warm and dry. No rash noted. Nails show clubbing.  Nailbeds not pale.  Psychiatric: She has a normal mood and affect. Her behavior is normal.    ED Course  Procedures (including critical care time)  DIAGNOSTIC STUDIES: Oxygen Saturation is 97% on room air,  adequate by my interpretation.    COORDINATION OF CARE: 4:19 PM -Clinical suspicion of ulcer. Will order Protonix, morphine and Zofran injections, GI cocktail, and saline lock IV. Patient verbalizes understanding and agrees with treatment plan.  EKG: Indication epigastric pain. Interpretation sinus rhythm no acute or ischemic changes no injury or ectopy.    Labs Review Labs Reviewed  CBC WITH DIFFERENTIAL - Abnormal; Notable for the following:    RBC 3.12 (*)    MCV 118.3 (*)    MCH 41.0 (*)    All other  components within normal limits  COMPREHENSIVE METABOLIC PANEL - Abnormal; Notable for the following:    Glucose, Bld 142 (*)    Total Protein 8.7 (*)    GFR calc non Af Amer 72 (*)    GFR calc Af Amer 84 (*)    All other components within normal limits  LIPASE, BLOOD   Imaging Review No results found.  MDM   1. Gastritis   2. Peptic ulcer      I personally performed the services described in this documentation, which was scribed in my presence. The recorded information has been reviewed and is accurate.     Lebron Quam, MD 09/18/13 [phone]

## 2013-09-18 NOTE — ED Notes (Addendum)
Abd pain Has appt [facility] tomorrow.  Pain is worse than usual pain.  Vomiting,diarrhea, no fever.  Has been treated for chronic abd pain for 24 years.

## 2013-09-19 ENCOUNTER — Emergency Department (HOSPITAL_COMMUNITY): Payer: Medicare Other

## 2013-09-19 ENCOUNTER — Encounter (HOSPITAL_COMMUNITY): Payer: Self-pay

## 2013-09-19 ENCOUNTER — Emergency Department (HOSPITAL_COMMUNITY)
Admission: EM | Admit: 2013-09-19 | Discharge: 2013-09-19 | Disposition: A | Discharge status: Home / Self Care | Payer: Medicare Other | Attending: Emergency Medicine | Admitting: Emergency Medicine

## 2013-09-19 DIAGNOSIS — R110 Nausea: Secondary | ICD-10-CM | POA: Diagnosis not present

## 2013-09-19 DIAGNOSIS — G43909 Migraine, unspecified, not intractable, without status migrainosus: Secondary | ICD-10-CM | POA: Diagnosis not present

## 2013-09-19 DIAGNOSIS — Z79899 Other long term (current) drug therapy: Secondary | ICD-10-CM | POA: Diagnosis not present

## 2013-09-19 DIAGNOSIS — F17200 Nicotine dependence, unspecified, uncomplicated: Secondary | ICD-10-CM | POA: Diagnosis not present

## 2013-09-19 DIAGNOSIS — G8929 Other chronic pain: Secondary | ICD-10-CM | POA: Insufficient documentation

## 2013-09-19 DIAGNOSIS — Z9889 Other specified postprocedural states: Secondary | ICD-10-CM | POA: Diagnosis not present

## 2013-09-19 DIAGNOSIS — R109 Unspecified abdominal pain: Secondary | ICD-10-CM

## 2013-09-19 DIAGNOSIS — Z90710 Acquired absence of both cervix and uterus: Secondary | ICD-10-CM | POA: Diagnosis not present

## 2013-09-19 DIAGNOSIS — R1013 Epigastric pain: Secondary | ICD-10-CM | POA: Diagnosis not present

## 2013-09-19 DIAGNOSIS — Z880 Allergy status to penicillin: Secondary | ICD-10-CM | POA: Insufficient documentation

## 2013-09-19 DIAGNOSIS — K7689 Other specified diseases of liver: Secondary | ICD-10-CM | POA: Diagnosis not present

## 2013-09-19 LAB — CBC WITH DIFFERENTIAL/PLATELET
Basophils Absolute: 0 10*3/uL (ref 0.0–0.1)
Basophils Relative: 0 % (ref 0–1)
Eosinophils Relative: 1 % (ref 0–5)
HCT: 38.6 % (ref 36.0–46.0)
Hemoglobin: 13.5 g/dL (ref 12.0–15.0)
MCH: 41.9 pg — ABNORMAL HIGH (ref 26.0–34.0)
MCHC: 35 g/dL (ref 30.0–36.0)
MCV: 119.9 fL — ABNORMAL HIGH (ref 78.0–100.0)
Monocytes Absolute: 0.7 10*3/uL (ref 0.1–1.0)
Monocytes Relative: 5 % (ref 3–12)
Neutro Abs: 12.9 10*3/uL — ABNORMAL HIGH (ref 1.7–7.7)
RDW: 15.6 % — ABNORMAL HIGH (ref 11.5–15.5)

## 2013-09-19 LAB — HEPATIC FUNCTION PANEL
ALT: 15 U/L (ref 0–35)
AST: 16 U/L (ref 0–37)
Bilirubin, Direct: 0.1 mg/dL (ref 0.0–0.3)
Total Bilirubin: 0.5 mg/dL (ref 0.3–1.2)
Total Protein: 8.3 g/dL (ref 6.0–8.3)

## 2013-09-19 LAB — BASIC METABOLIC PANEL
BUN: 15 mg/dL (ref 6–23)
Calcium: 9.7 mg/dL (ref 8.4–10.5)
Chloride: 101 mEq/L (ref 96–112)
Creatinine, Ser: 0.91 mg/dL (ref 0.50–1.10)
GFR calc Af Amer: 81 mL/min — ABNORMAL LOW (ref >90)

## 2013-09-19 LAB — LIPASE, BLOOD: Lipase: 30 U/L (ref 11–59)

## 2013-09-19 MED ORDER — OMEPRAZOLE 20 MG PO CPDR: 20.0000 mg | DELAYED_RELEASE_CAPSULE | Freq: Every day | ORAL | Status: DC

## 2013-09-19 MED ORDER — LORAZEPAM 2 MG/ML IJ SOLN
1.0000 mg | Freq: Once | INTRAMUSCULAR | Status: AC
  Administered 2013-09-19: 1 mg via INTRAVENOUS
  Filled 2013-09-19: qty 1

## 2013-09-19 MED ORDER — ONDANSETRON HCL 4 MG/2ML IJ SOLN
4.0000 mg | Freq: Once | INTRAMUSCULAR | Status: AC
  Administered 2013-09-19: 4 mg via INTRAVENOUS
  Filled 2013-09-19: qty 2

## 2013-09-19 MED ORDER — SODIUM CHLORIDE 0.9 % IJ SOLN
INTRAMUSCULAR | Status: AC
  Filled 2013-09-19: qty 250

## 2013-09-19 MED ORDER — IOHEXOL 300 MG/ML  SOLN
100.0000 mL | Freq: Once | INTRAMUSCULAR | Status: AC | PRN
  Administered 2013-09-19: 100 mL via INTRAVENOUS

## 2013-09-19 MED ORDER — PANTOPRAZOLE SODIUM 40 MG IV SOLR
40.0000 mg | Freq: Once | INTRAVENOUS | Status: AC
  Administered 2013-09-19: 40 mg via INTRAVENOUS
  Filled 2013-09-19: qty 40

## 2013-09-19 MED ORDER — IOHEXOL 300 MG/ML  SOLN
50.0000 mL | Freq: Once | INTRAMUSCULAR | Status: AC | PRN
  Administered 2013-09-19: 50 mL via INTRAVENOUS

## 2013-09-19 MED ORDER — MORPHINE SULFATE 4 MG/ML IJ SOLN
4.0000 mg | INTRAMUSCULAR | Status: DC | PRN
  Administered 2013-09-19: 4 mg via INTRAVENOUS
  Filled 2013-09-19: qty 1

## 2013-09-19 MED ORDER — METOCLOPRAMIDE HCL 5 MG/ML IJ SOLN
10.0000 mg | Freq: Once | INTRAMUSCULAR | Status: AC
  Administered 2013-09-19: 10 mg via INTRAVENOUS
  Filled 2013-09-19: qty 2

## 2013-09-19 MED ORDER — HYDROMORPHONE HCL PF 1 MG/ML IJ SOLN
1.0000 mg | Freq: Once | INTRAMUSCULAR | Status: AC
  Administered 2013-09-19: 1 mg via INTRAVENOUS
  Filled 2013-09-19: qty 1

## 2013-09-19 NOTE — ED Notes (Signed)
No new orders to medicate pt. Pt aware and is not upset. Pt to waiting area.

## 2013-09-19 NOTE — ED Notes (Signed)
Pt seen here yesterday afternoon, told she had an ulcer and gastritis.  Pt states she received prescriptions but has not been able to get them filled yet due to nighttime.  Pt awoke after midnight and states pain has worsened

## 2013-09-19 NOTE — ED Notes (Signed)
Pt requesting additional pain medication prior to going to waiting room to wait for ride home. Primary nurse aware and will speak with physician. Pt is currently sitting up in bed drinking soda. NAD.

## 2013-09-19 NOTE — ED Provider Notes (Signed)
CSN: [id number]     Arrival date & time 09/19/13  0445 History   First MD Initiated Contact with Patient 09/19/13 0450     Chief Complaint  Patient presents with  . Abdominal Pain  . Nausea   (Consider location/radiation/quality/duration/timing/severity/associated sxs/prior Treatment) Patient is a 52 y.o. female presenting with abdominal pain. The history is provided by the patient (the pt states she was seen today and dx with gastritis.  she has not got her meds filled and awoke tonight with pain).  Abdominal Pain Pain location:  Epigastric Pain quality: aching and burning   Pain radiates to:  Back Pain severity:  Severe Onset quality:  Sudden Timing:  Constant Progression:  Unchanged Chronicity:  New Context: awakening from sleep   Associated symptoms: no chest pain, no cough, no diarrhea, no fatigue and no hematuria     Past Medical History  Diagnosis Date  . Chronic pain   . Chronic migraine    Past Surgical History  Procedure Laterality Date  . Cesarean section    . Cholecystectomy    . Abdominal hysterectomy    . Abdominal surgery     Family History  Problem Relation Age of Onset  . Asthma Mother   . Cancer Father    History  Substance Use Topics  . Smoking status: Current Every Day Smoker -- 1.00 packs/day    Types: Cigarettes  . Smokeless tobacco: Never Used  . Alcohol Use: No   OB History   Grav Para Term Preterm Abortions TAB SAB Ect Mult Living                 Review of Systems  Constitutional: Negative for appetite change and fatigue.  HENT: Negative for congestion, sinus pressure and ear discharge.   Eyes: Negative for discharge.  Respiratory: Negative for cough.   Cardiovascular: Negative for chest pain.  Gastrointestinal: Positive for abdominal pain. Negative for diarrhea.  Genitourinary: Negative for frequency and hematuria.  Musculoskeletal: Negative for back pain.  Skin: Negative for rash.  Neurological: Negative for seizures and  headaches.  Psychiatric/Behavioral: Negative for hallucinations.    Allergies  Penicillins and Sulfa antibiotics  Home Medications   Current Outpatient Rx  Name  Route  Sig  Dispense  Refill  . diazepam (VALIUM) 5 MG tablet   Oral   Take 5 mg by mouth 3 (three) times daily.         . diphenhydrAMINE (BENADRYL) 25 mg capsule   Oral   Take 25 mg by mouth every 6 (six) hours as needed. For itching         . levothyroxine (SYNTHROID, LEVOTHROID) 125 MCG tablet   Oral   Take 125 mcg by mouth daily before breakfast.         . omeprazole (PRILOSEC) 20 MG capsule   Oral   Take 1 capsule (20 mg total) by mouth 2 (two) times daily.   60 capsule   0   . ondansetron (ZOFRAN) 4 MG tablet   Oral   Take 1 tablet (4 mg total) by mouth every 6 (six) hours.   12 tablet   0   . OxyCODONE (OXYCONTIN) 20 mg T12A 12 hr tablet   Oral   Take 1 tablet (20 mg total) by mouth every 12 (twelve) hours.   4 tablet   0   . OxyCODONE (OXYCONTIN) 20 mg T12A 12 hr tablet   Oral   Take 1 tablet (20 mg total) by mouth every 12 (  twelve) hours.   4 tablet   0   . sucralfate (CARAFATE) 1 G tablet   Oral   Take 1 tablet (1 g total) by mouth 4 (four) times daily.   60 tablet   0   . SUMAtriptan (IMITREX) 100 MG tablet   Oral   Take 100 mg by mouth every 2 (two) hours as needed for migraine.         Marland Kitchen oxyCODONE (OXYCONTIN) 80 MG 12 hr tablet   Oral   Take 80 mg by mouth every 12 (twelve) hours.          BP 92/64  Pulse 89  Temp(Src) 97.9 F (36.6 C) (Oral)  Resp 20  Ht 5' 3"  (1.6 m)  Wt 200 lb (90.719 kg)  BMI 35.44 kg/m2  SpO2 97% Physical Exam  Constitutional: She is oriented to person, place, and time. She appears well-developed.  HENT:  Head: Normocephalic.  Eyes: Conjunctivae and EOM are normal. No scleral icterus.  Neck: Neck supple. No thyromegaly present.  Cardiovascular: Normal rate and regular rhythm.  Exam reveals no gallop and no friction rub.   No murmur  heard. Pulmonary/Chest: No stridor. She has no wheezes. She has no rales. She exhibits no tenderness.  Abdominal: She exhibits no distension. There is tenderness. There is no rebound.  Moderate epigastric tenderness  Musculoskeletal: Normal range of motion. She exhibits no edema.  Lymphadenopathy:    She has no cervical adenopathy.  Neurological: She is oriented to person, place, and time. She exhibits normal muscle tone. Coordination normal.  Skin: No rash noted. No erythema.  Psychiatric: She has a normal mood and affect. Her behavior is normal.    ED Course  Procedures (including critical care time) Labs Review Labs Reviewed  CBC WITH DIFFERENTIAL - Abnormal; Notable for the following:    WBC 16.1 (*)    RBC 3.22 (*)    MCV 119.9 (*)    MCH 41.9 (*)    RDW 15.6 (*)    Neutrophils Relative % 80 (*)    Neutro Abs 12.9 (*)    All other components within normal limits  BASIC METABOLIC PANEL - Abnormal; Notable for the following:    Glucose, Bld 169 (*)    GFR calc non Af Amer 70 (*)    GFR calc Af Amer 81 (*)    All other components within normal limits   Imaging Review Dg Abd Acute W/chest  09/19/2013   CLINICAL DATA:  Severe epigastric pain, nausea.  EXAM: ACUTE ABDOMEN SERIES (ABDOMEN 2 VIEW & CHEST 1 VIEW)  COMPARISON:  07/13/2006  FINDINGS: Prior cholecystectomy. Mild gaseous distention of the colon. No small bowel distention. No free air organomegaly.  Heart and mediastinal contours are within normal limits. No focal opacities or effusions. No acute bony abnormality.  IMPRESSION: Mild gaseous distention of the colon.  Prior cholecystectomy.  No active cardiopulmonary disease.   Electronically Signed   By: Rolm Baptise M.D.   On: 09/19/2013 06:06    MDM  No diagnosis found.     Maudry Diego, MD 09/19/13 [phone]

## 2013-09-19 NOTE — ED Provider Notes (Signed)
9:28 AM Ct Abdomen Pelvis W Contrast  09/19/2013   CLINICAL DATA:  Epigastric abdominal pain  EXAM: CT ABDOMEN AND PELVIS WITH CONTRAST  TECHNIQUE: Multidetector CT imaging of the abdomen and pelvis was performed using the standard protocol following bolus administration of intravenous contrast.  CONTRAST:  72m OMNIPAQUE IOHEXOL 300 MG/ML SOLN, 1052mOMNIPAQUE IOHEXOL 300 MG/ML SOLN  COMPARISON:  Report of remote non digitized CT abdomen/ pelvis 01/17/2002 is reviewed.  FINDINGS: Lung bases are clear. Diffuse hepatic hypodensity compatible steatosis noted. No focal hepatic abnormality. Cholecystectomy clips are noted. Adrenal glands, kidneys, spleen/splenule, and pancreas are unremarkable. There is mild diffuse common duct prominence measuring 1.1 cm image 20, with a few foci of gas within the ducts. Apparent contrast reflux into the pancreatic portion of the distal common duct and findings described above are most compatible with prior sphincterotomy or ampullary patency. No pancreatic ductal dilatation. No evidence for choledocholithiasis by CT.  Moderate atheromatous aortic calcification without aneurysm. No bowel wall thickening or focal segmental dilatation is identified. The appendix is not identified but there is no secondary evidence for acute appendicitis. Ovaries are normal. Uterus surgically absent. Trace fluid is noted at the region of the vaginal cuff. Bladder is unremarkable. No radiopaque renal or ureteral calculus. No free air, lymphadenopathy, or abdominal ascites.  No acute osseous finding.  IMPRESSION: No acute intra-abdominal or pelvic pathology identified. Probable reflux of trace gas and contrast within the distal common duct likely reflecting ampullary patency.  Hepatic steatosis.   Electronically Signed   By: GrConchita Paris.D.   On: 09/19/2013 09:06   Dg Abd Acute W/chest  09/19/2013   CLINICAL DATA:  Severe epigastric pain, nausea.  EXAM: ACUTE ABDOMEN SERIES (ABDOMEN 2 VIEW & CHEST  1 VIEW)  COMPARISON:  07/13/2006  FINDINGS: Prior cholecystectomy. Mild gaseous distention of the colon. No small bowel distention. No free air organomegaly.  Heart and mediastinal contours are within normal limits. No focal opacities or effusions. No acute bony abnormality.  IMPRESSION: Mild gaseous distention of the colon.  Prior cholecystectomy.  No active cardiopulmonary disease.   Electronically Signed   By: KeRolm Baptise.D.   On: 09/19/2013 06:06  I personally reviewed the imaging tests through PACS system I reviewed available ER/hospitalization records through the EMR  Patient feels better at this time.  Discharge home in good condition.  CT scan labs without significant abnormality.  The patient will need to followup with gastroenterology.  She may benefit from endoscopy.  Home on Prilosec.  KeHoy MornMD 09/19/13 [phone]

## 2014-04-08 ENCOUNTER — Encounter (HOSPITAL_COMMUNITY): Payer: Self-pay | Admitting: Emergency Medicine

## 2014-04-08 ENCOUNTER — Emergency Department (HOSPITAL_COMMUNITY)
Admission: EM | Admit: 2014-04-08 | Discharge: 2014-04-08 | Disposition: A | Discharge status: Home / Self Care | Payer: Medicare Other | Attending: Emergency Medicine | Admitting: Emergency Medicine

## 2014-04-08 ENCOUNTER — Emergency Department (HOSPITAL_COMMUNITY): Payer: Medicare Other

## 2014-04-08 DIAGNOSIS — I10 Essential (primary) hypertension: Secondary | ICD-10-CM | POA: Diagnosis not present

## 2014-04-08 DIAGNOSIS — J329 Chronic sinusitis, unspecified: Secondary | ICD-10-CM | POA: Diagnosis not present

## 2014-04-08 DIAGNOSIS — G43909 Migraine, unspecified, not intractable, without status migrainosus: Secondary | ICD-10-CM | POA: Insufficient documentation

## 2014-04-08 DIAGNOSIS — G8929 Other chronic pain: Secondary | ICD-10-CM | POA: Insufficient documentation

## 2014-04-08 DIAGNOSIS — F17200 Nicotine dependence, unspecified, uncomplicated: Secondary | ICD-10-CM | POA: Insufficient documentation

## 2014-04-08 DIAGNOSIS — J019 Acute sinusitis, unspecified: Secondary | ICD-10-CM | POA: Diagnosis not present

## 2014-04-08 DIAGNOSIS — R0602 Shortness of breath: Secondary | ICD-10-CM | POA: Insufficient documentation

## 2014-04-08 DIAGNOSIS — R209 Unspecified disturbances of skin sensation: Secondary | ICD-10-CM | POA: Diagnosis not present

## 2014-04-08 DIAGNOSIS — E039 Hypothyroidism, unspecified: Secondary | ICD-10-CM | POA: Diagnosis not present

## 2014-04-08 DIAGNOSIS — Z79899 Other long term (current) drug therapy: Secondary | ICD-10-CM | POA: Diagnosis not present

## 2014-04-08 DIAGNOSIS — Z880 Allergy status to penicillin: Secondary | ICD-10-CM | POA: Diagnosis not present

## 2014-04-08 DIAGNOSIS — J9819 Other pulmonary collapse: Secondary | ICD-10-CM | POA: Diagnosis not present

## 2014-04-08 LAB — COMPREHENSIVE METABOLIC PANEL
ALT: 11 U/L (ref 0–35)
AST: 24 U/L (ref 0–37)
Albumin: 4.5 g/dL (ref 3.5–5.2)
Alkaline Phosphatase: 73 U/L (ref 39–117)
BUN: 12 mg/dL (ref 6–23)
CALCIUM: 9.6 mg/dL (ref 8.4–10.5)
CO2: 29 meq/L (ref 19–32)
Chloride: 93 mEq/L — ABNORMAL LOW (ref 96–112)
Creatinine, Ser: 1.33 mg/dL — ABNORMAL HIGH (ref 0.50–1.10)
GFR calc Af Amer: 51 mL/min — ABNORMAL LOW (ref >90)
GFR, EST NON AFRICAN AMERICAN: 44 mL/min — AB (ref >90)
Glucose, Bld: 147 mg/dL — ABNORMAL HIGH (ref 70–99)
Potassium: 3.8 mEq/L (ref 3.7–5.3)
SODIUM: 136 meq/L — AB (ref 137–147)
TOTAL PROTEIN: 8.7 g/dL — AB (ref 6.0–8.3)
Total Bilirubin: 0.5 mg/dL (ref 0.3–1.2)

## 2014-04-08 LAB — CBC WITH DIFFERENTIAL/PLATELET
BASOS ABS: 0 10*3/uL (ref 0.0–0.1)
Basophils Relative: 1 % (ref 0–1)
EOS PCT: 1 % (ref 0–5)
Eosinophils Absolute: 0 10*3/uL (ref 0.0–0.7)
HCT: 35.2 % — ABNORMAL LOW (ref 36.0–46.0)
Hemoglobin: 12.7 g/dL (ref 12.0–15.0)
LYMPHS PCT: 39 % (ref 12–46)
Lymphs Abs: 2.5 10*3/uL (ref 0.7–4.0)
MCH: 42.9 pg — ABNORMAL HIGH (ref 26.0–34.0)
MCHC: 36.1 g/dL — ABNORMAL HIGH (ref 30.0–36.0)
MCV: 118.9 fL — AB (ref 78.0–100.0)
Monocytes Absolute: 0.2 10*3/uL (ref 0.1–1.0)
Monocytes Relative: 3 % (ref 3–12)
Neutro Abs: 3.6 10*3/uL (ref 1.7–7.7)
Neutrophils Relative %: 57 % (ref 43–77)
Platelets: 89 10*3/uL — ABNORMAL LOW (ref 150–400)
RBC: 2.96 MIL/uL — ABNORMAL LOW (ref 3.87–5.11)
RDW: 16 % — AB (ref 11.5–15.5)
WBC: 6.4 10*3/uL (ref 4.0–10.5)

## 2014-04-08 LAB — T4: T4, Total: 1 ug/dL — ABNORMAL LOW (ref 5.0–12.5)

## 2014-04-08 LAB — PRO B NATRIURETIC PEPTIDE: PRO B NATRI PEPTIDE: 106.1 pg/mL (ref 0–125)

## 2014-04-08 LAB — TSH: TSH: >100 u[IU]/mL — ABNORMAL HIGH (ref 0.350–4.500)

## 2014-04-08 MED ORDER — OXYCODONE-ACETAMINOPHEN 5-325 MG PO TABS
1.0000 | ORAL_TABLET | Freq: Once | ORAL | Status: AC
  Administered 2014-04-08: 1 via ORAL
  Filled 2014-04-08: qty 1

## 2014-04-08 MED ORDER — LEVOTHYROXINE SODIUM 175 MCG PO TABS: 175.0000 ug | ORAL_TABLET | Freq: Every day | ORAL | Status: DC

## 2014-04-08 MED ORDER — LEVOFLOXACIN 500 MG PO TABS: 500.0000 mg | ORAL_TABLET | Freq: Every day | ORAL | Status: DC

## 2014-04-08 MED ORDER — OXYCODONE-ACETAMINOPHEN 5-325 MG PO TABS
1.0000 | ORAL_TABLET | Freq: Four times a day (QID) | ORAL | Status: DC | PRN
Start: 2014-04-08 — End: 2014-05-01

## 2014-04-08 NOTE — ED Notes (Signed)
Multiple complaints. Fatigue x 1 week. Pt states pain numbness to hands x 2 mo. Also states she feels a little sob x 2 days. PT states pain all over, mostly to joints. Also states sinus pressure and drainage. Pt states she has not had thyroid medication x 3 mo.

## 2014-04-08 NOTE — Care Management Note (Signed)
ED/CM noted patient did not have health insurance and/or PCP listed in the computer.  Patient was given the [facility] with information on the clinics, food pantries, and the handout for new health insurance sign-up.  Patient expressed appreciation for information received.

## 2014-04-08 NOTE — ED Provider Notes (Signed)
CSN: [id number]     Arrival date & time 04/08/14  1540 History  This chart was scribed for Maudry Diego, MD by Marcha Dutton, ED Scribe. This patient was seen in room APA11/APA11 and the patient's care was started at 9:00 AM.    Chief Complaint  Patient presents with  . Fatigue      Patient is a 53 y.o. female presenting with extremity weakness. The history is provided by the patient. No language interpreter was used.  Extremity Weakness This is a new problem. The current episode started more than 2 days ago. The problem occurs constantly. The problem has been gradually worsening. Associated symptoms include shortness of breath (for 2 days). Pertinent negatives include no chest pain, no abdominal pain and no headaches. Associated symptoms comments: Pt reports associated sinus pressure, drainage, and congestion.. The symptoms are aggravated by bending and exertion. Nothing relieves the symptoms. She has tried rest for the symptoms. The treatment provided no relief.  Pt denies regular pcp. She states her right hand began to go numb about two weeks ago. She states it began radiate from her fingers to her forearms. She states she hasn't been taking her thyroid medication 3 months ago. She states she hasn't been able to see a doctor because of insurance difficulties, affordability, and difficulties getting in to see a doctor accepting patients.  Past Medical History  Diagnosis Date  . Chronic pain   . Chronic migraine    Past Surgical History  Procedure Laterality Date  . Cesarean section    . Cholecystectomy    . Abdominal hysterectomy    . Abdominal surgery     Family History  Problem Relation Age of Onset  . Asthma Mother   . Cancer Father    History  Substance Use Topics  . Smoking status: Current Every Day Smoker -- 1.00 packs/day    Types: Cigarettes  . Smokeless tobacco: Never Used  . Alcohol Use: No   OB History   Grav Para Term Preterm Abortions TAB SAB Ect Mult  Living                 Review of Systems  Constitutional: Negative for appetite change and fatigue.  HENT: Negative for congestion, ear discharge and sinus pressure.   Eyes: Negative for discharge.  Respiratory: Positive for shortness of breath (for 2 days). Negative for cough.   Cardiovascular: Negative for chest pain.  Gastrointestinal: Negative for abdominal pain and diarrhea.  Genitourinary: Negative for frequency and hematuria.  Musculoskeletal: Positive for arthralgias (generalized and hands), extremity weakness and myalgias (generalized). Negative for back pain.  Skin: Negative for rash.  Neurological: Positive for numbness (in both hands). Negative for seizures and headaches.  Psychiatric/Behavioral: Negative for hallucinations.      Allergies  Penicillins and Sulfa antibiotics  Home Medications   Prior to Admission medications   Medication Sig Start Date End Date Taking? Authorizing Provider  diazepam (VALIUM) 5 MG tablet Take 5 mg by mouth 3 (three) times daily.    Historical Provider, MD  diphenhydrAMINE (BENADRYL) 25 mg capsule Take 25 mg by mouth every 6 (six) hours as needed. For itching    Historical Provider, MD  levothyroxine (SYNTHROID, LEVOTHROID) 125 MCG tablet Take 125 mcg by mouth daily before breakfast.    Historical Provider, MD  omeprazole (PRILOSEC) 20 MG capsule Take 1 capsule (20 mg total) by mouth daily. 09/19/13   Hoy Morn, MD  OxyCODONE (OXYCONTIN) 20 mg T12A 12 hr  tablet Take 20 mg by mouth every 12 (twelve) hours. 09/18/13   Tanna Furry, MD  SUMAtriptan (IMITREX) 100 MG tablet Take 100 mg by mouth every 2 (two) hours as needed for migraine.    Historical Provider, MD   Triage Vitals: BP 151/100  Pulse 91  Temp(Src) 97.7 F (36.5 C) (Oral)  Resp 18  Ht 5' 2.5" (1.588 m)  Wt 200 lb (90.719 kg)  BMI 35.97 kg/m2  SpO2 99%  Physical Exam  Nursing note and vitals reviewed. Constitutional: She is oriented to person, place, and time. She  appears well-developed.  HENT:  Head: Normocephalic.  Mouth/Throat: Mucous membranes are dry.  Swelling to the face  Eyes: Conjunctivae and EOM are normal. No scleral icterus.  Neck: Neck supple. No thyromegaly present.  Cardiovascular: Normal rate and regular rhythm.  Exam reveals no gallop and no friction rub.   No murmur heard. Pulmonary/Chest: No stridor. She has no wheezes. She has no rales. She exhibits no tenderness.  Abdominal: She exhibits no distension. There is no tenderness. There is no rebound.  Musculoskeletal: Normal range of motion. She exhibits no edema.  Lymphadenopathy:    She has no cervical adenopathy.  Neurological: She is oriented to person, place, and time. She exhibits normal muscle tone. Coordination normal.  Skin: Skin is warm and dry. No rash noted. No erythema.  Dry scaly skin  Psychiatric: She has a normal mood and affect. Her behavior is normal.    ED Course  Procedures (including critical care time)  DIAGNOSTIC STUDIES: Oxygen Saturation is 99% on RA, normal by my interpretation.    COORDINATION OF CARE: 9:03 AM- Pt advised of plan for treatment and pt agrees.    Labs Review Labs Reviewed  CBC WITH DIFFERENTIAL - Abnormal; Notable for the following:    RBC 2.96 (*)    HCT 35.2 (*)    MCV 118.9 (*)    MCH 42.9 (*)    MCHC 36.1 (*)    RDW 16.0 (*)    Platelets 89 (*)    All other components within normal limits  COMPREHENSIVE METABOLIC PANEL - Abnormal; Notable for the following:    Sodium 136 (*)    Chloride 93 (*)    Glucose, Bld 147 (*)    Creatinine, Ser 1.33 (*)    Total Protein 8.7 (*)    GFR calc non Af Amer 44 (*)    GFR calc Af Amer 51 (*)    All other components within normal limits  PRO B NATRIURETIC PEPTIDE  T4  TSH    Imaging Review Dg Chest 2 View  04/08/2014   CLINICAL DATA:  Fatigue, weakness, smoker, hypertension, sleep apnea  EXAM: CHEST  2 VIEW  COMPARISON:  09/19/2013  FINDINGS: Enlargement of cardiac  silhouette.  Mediastinal contours and pulmonary vascularity normal.  Minimal atelectasis at right costophrenic angle.  Lungs otherwise clear.  No pleural effusion or pneumothorax.  No acute osseous findings.  IMPRESSION: Minimal right basilar atelectasis.  Enlargement of cardiac silhouette.   Electronically Signed   By: Lavonia Dana M.D.   On: 04/08/2014 09:41     EKG Interpretation None      MDM   Final diagnoses:  None  The chart was scribed for me under my direct supervision.  I personally performed the history, physical, and medical decision making and all procedures in the evaluation of this patient.Maudry Diego, MD 04/08/14 [phone]

## 2014-04-08 NOTE — Discharge Instructions (Signed)
Follow up with a family md in 1 week

## 2014-05-01 ENCOUNTER — Encounter (HOSPITAL_COMMUNITY): Payer: Self-pay | Admitting: Emergency Medicine

## 2014-05-01 ENCOUNTER — Emergency Department (HOSPITAL_COMMUNITY)
Admission: EM | Admit: 2014-05-01 | Discharge: 2014-05-01 | Disposition: A | Discharge status: Home / Self Care | Payer: Medicare Other | Attending: Emergency Medicine | Admitting: Emergency Medicine

## 2014-05-01 DIAGNOSIS — R109 Unspecified abdominal pain: Secondary | ICD-10-CM | POA: Diagnosis not present

## 2014-05-01 DIAGNOSIS — Z90710 Acquired absence of both cervix and uterus: Secondary | ICD-10-CM | POA: Diagnosis not present

## 2014-05-01 DIAGNOSIS — R197 Diarrhea, unspecified: Secondary | ICD-10-CM | POA: Diagnosis not present

## 2014-05-01 DIAGNOSIS — F17200 Nicotine dependence, unspecified, uncomplicated: Secondary | ICD-10-CM | POA: Diagnosis not present

## 2014-05-01 DIAGNOSIS — G43709 Chronic migraine without aura, not intractable, without status migrainosus: Secondary | ICD-10-CM | POA: Diagnosis not present

## 2014-05-01 DIAGNOSIS — R112 Nausea with vomiting, unspecified: Secondary | ICD-10-CM | POA: Insufficient documentation

## 2014-05-01 DIAGNOSIS — Z880 Allergy status to penicillin: Secondary | ICD-10-CM | POA: Diagnosis not present

## 2014-05-01 DIAGNOSIS — G8929 Other chronic pain: Secondary | ICD-10-CM | POA: Insufficient documentation

## 2014-05-01 DIAGNOSIS — Z9889 Other specified postprocedural states: Secondary | ICD-10-CM | POA: Diagnosis not present

## 2014-05-01 DIAGNOSIS — Z3202 Encounter for pregnancy test, result negative: Secondary | ICD-10-CM | POA: Insufficient documentation

## 2014-05-01 DIAGNOSIS — Z79899 Other long term (current) drug therapy: Secondary | ICD-10-CM | POA: Insufficient documentation

## 2014-05-01 DIAGNOSIS — Z792 Long term (current) use of antibiotics: Secondary | ICD-10-CM | POA: Diagnosis not present

## 2014-05-01 HISTORY — DX: Unspecified abdominal pain: R10.9

## 2014-05-01 HISTORY — DX: Other chronic pain: G89.29

## 2014-05-01 LAB — COMPREHENSIVE METABOLIC PANEL
ALK PHOS: 120 U/L — AB (ref 39–117)
ALT: 11 U/L (ref 0–35)
AST: 14 U/L (ref 0–37)
Albumin: 4.1 g/dL (ref 3.5–5.2)
BUN: 10 mg/dL (ref 6–23)
CO2: 30 mEq/L (ref 19–32)
Calcium: 9.8 mg/dL (ref 8.4–10.5)
Chloride: 97 mEq/L (ref 96–112)
Creatinine, Ser: 0.87 mg/dL (ref 0.50–1.10)
GFR calc Af Amer: 86 mL/min — ABNORMAL LOW (ref >90)
GFR calc non Af Amer: 74 mL/min — ABNORMAL LOW (ref >90)
Glucose, Bld: 190 mg/dL — ABNORMAL HIGH (ref 70–99)
Potassium: 3.6 mEq/L — ABNORMAL LOW (ref 3.7–5.3)
Sodium: 139 mEq/L (ref 137–147)
Total Bilirubin: 0.9 mg/dL (ref 0.3–1.2)
Total Protein: 8 g/dL (ref 6.0–8.3)

## 2014-05-01 LAB — URINE MICROSCOPIC-ADD ON

## 2014-05-01 LAB — URINALYSIS, ROUTINE W REFLEX MICROSCOPIC
BILIRUBIN URINE: NEGATIVE
Glucose, UA: NEGATIVE mg/dL
Ketones, ur: NEGATIVE mg/dL
Leukocytes, UA: NEGATIVE
Nitrite: NEGATIVE
PH: 6.5 (ref 5.0–8.0)
Protein, ur: NEGATIVE mg/dL
Specific Gravity, Urine: 1.01 (ref 1.005–1.030)
Urobilinogen, UA: 1 mg/dL (ref 0.0–1.0)

## 2014-05-01 LAB — CBC WITH DIFFERENTIAL/PLATELET
BASOS PCT: 0 % (ref 0–1)
Basophils Absolute: 0 10*3/uL (ref 0.0–0.1)
EOS PCT: 1 % (ref 0–5)
Eosinophils Absolute: 0.1 10*3/uL (ref 0.0–0.7)
HCT: 29.8 % — ABNORMAL LOW (ref 36.0–46.0)
HEMOGLOBIN: 10.6 g/dL — AB (ref 12.0–15.0)
LYMPHS PCT: 32 % (ref 12–46)
Lymphs Abs: 1.7 10*3/uL (ref 0.7–4.0)
MCH: 41.7 pg — ABNORMAL HIGH (ref 26.0–34.0)
MCHC: 35.6 g/dL (ref 30.0–36.0)
MCV: 117.3 fL — ABNORMAL HIGH (ref 78.0–100.0)
Monocytes Absolute: 0.2 10*3/uL (ref 0.1–1.0)
Monocytes Relative: 4 % (ref 3–12)
NEUTROS ABS: 3.4 10*3/uL (ref 1.7–7.7)
Neutrophils Relative %: 63 % (ref 43–77)
Platelets: 134 10*3/uL — ABNORMAL LOW (ref 150–400)
RBC: 2.54 MIL/uL — ABNORMAL LOW (ref 3.87–5.11)
RDW: 13.6 % (ref 11.5–15.5)
WBC: 5.4 10*3/uL (ref 4.0–10.5)

## 2014-05-01 LAB — RAPID URINE DRUG SCREEN, HOSP PERFORMED
AMPHETAMINES: NOT DETECTED
BARBITURATES: NOT DETECTED
Benzodiazepines: POSITIVE — AB
COCAINE: NOT DETECTED
Opiates: NOT DETECTED
TETRAHYDROCANNABINOL: POSITIVE — AB

## 2014-05-01 LAB — LIPASE, BLOOD: LIPASE: 29 U/L (ref 11–59)

## 2014-05-01 LAB — PREGNANCY, URINE: Preg Test, Ur: NEGATIVE

## 2014-05-01 MED ORDER — METOCLOPRAMIDE HCL 5 MG/ML IJ SOLN
10.0000 mg | Freq: Once | INTRAMUSCULAR | Status: AC
  Administered 2014-05-01: 10 mg via INTRAMUSCULAR
  Filled 2014-05-01: qty 2

## 2014-05-01 MED ORDER — DIPHENHYDRAMINE HCL 50 MG/ML IJ SOLN: 50.0000 mg | Freq: Once | INTRAMUSCULAR | Status: DC

## 2014-05-01 MED ORDER — DIPHENHYDRAMINE HCL 50 MG/ML IJ SOLN
50.0000 mg | Freq: Once | INTRAMUSCULAR | Status: AC
  Administered 2014-05-01: 50 mg via INTRAMUSCULAR
  Filled 2014-05-01: qty 1

## 2014-05-01 MED ORDER — GI COCKTAIL ~~LOC~~
30.0000 mL | Freq: Once | ORAL | Status: AC
  Administered 2014-05-01: 30 mL via ORAL
  Filled 2014-05-01: qty 30

## 2014-05-01 MED ORDER — PROMETHAZINE HCL 25 MG PO TABS: 25.0000 mg | ORAL_TABLET | Freq: Three times a day (TID) | ORAL | Status: DC | PRN

## 2014-05-01 NOTE — ED Notes (Signed)
Pt states abdominal and leg pain x 3-4 months. States she has been seen for the same here four times.

## 2014-05-01 NOTE — ED Provider Notes (Signed)
CSN: [id number]     Arrival date & time 05/01/14  1426 History  This chart was scribed for No att. providers found by Ladene Artist, ED Scribe. The patient was seen in room APOTF/OTF. Patient's care was started at 12:47 AM.    Chief Complaint  Patient presents with  . Abdominal Pain     The history is provided by the patient. No language interpreter was used.   HPI Comments: Deborah Russo is a 53 y.o. female who presents to the Emergency Department complaining of abdominal pain. Patient reports she has had severe abdominal pain that has been constant for the past 4-5 months. She initially told me eating certain types of foods when it first started made it worse however she cannot give me one example of a food that made it worse when it started. She states nothing she does makes it feel better. She states she has nausea. She vomits about once a week. She has diarrhea one time a day every other day. She denies constipation. She denies fever, or dysuria. She states she has frequency and states she urinates once at night and 2-3 times during the day. She had been on thyroid medication however she ran out for 3 months. She's been back on it for the past month. She reports when she was off the thyroid medicine she had a lot of swelling in her hands and feet. However that swelling is now gone and she now has extreme dryness of her skin. She states her skin itches and hurts but it doesn't drain. She has had chronic upper abdominal pain for 25 years. She states she ran out of her chronic pain medicine yesterday. She's unable to get her new pain prescription for another 4 days. She states her pain management doctor will not give her prescriptions early.  PCP none Pain Management Dr Beatris Si in [facility]  Past Medical History  Diagnosis Date  . Chronic pain   . Chronic migraine   . Chronic abdominal pain     since approximately 1991   Past Surgical History  Procedure Laterality Date  . Cesarean  section    . Cholecystectomy    . Abdominal hysterectomy    . Abdominal surgery     Family History  Problem Relation Age of Onset  . Asthma Mother   . Cancer Father    History  Substance Use Topics  . Smoking status: Current Every Day Smoker -- 1.00 packs/day    Types: Cigarettes  . Smokeless tobacco: Never Used  . Alcohol Use: No   On disability for chronic abdominal pain for 25 years (epigastric/xyphoid)  OB History   Grav Para Term Preterm Abortions TAB SAB Ect Mult Living                 Review of Systems  All other systems reviewed and are negative.   Allergies  Penicillins and Sulfa antibiotics  Home Medications   Prior to Admission medications   Medication Sig Start Date End Date Taking? Authorizing Provider  diazepam (VALIUM) 5 MG tablet Take 5 mg by mouth 3 (three) times daily.    Historical Provider, MD  levofloxacin (LEVAQUIN) 500 MG tablet Take 1 tablet (500 mg total) by mouth daily. 04/08/14   Maudry Diego, MD  levothyroxine (SYNTHROID, LEVOTHROID) 175 MCG tablet Take 1 tablet (175 mcg total) by mouth daily before breakfast. 04/08/14   Maudry Diego, MD  OxyCODONE (OXYCONTIN) 20 mg T12A 12 hr tablet Take 20  mg by mouth every 12 (twelve) hours. 09/18/13   Tanna Furry, MD  oxyCODONE-acetaminophen (PERCOCET/ROXICET) 5-325 MG per tablet Take 1 tablet by mouth every 6 (six) hours as needed for severe pain. 04/08/14   Maudry Diego, MD  SUMAtriptan (IMITREX) 100 MG tablet Take 100 mg by mouth every 2 (two) hours as needed for migraine.    Historical Provider, MD   Triage Vitals: BP 152/91  Pulse 96  Temp(Src) 98.4 F (36.9 C) (Oral)  Resp 16  Ht 5' 2"  (1.575 m)  Wt 200 lb (90.719 kg)  BMI 36.57 kg/m2  SpO2 97%  Vital signs normal   Physical Exam  Nursing note and vitals reviewed. Constitutional: She is oriented to person, place, and time. She appears well-developed and well-nourished.  Non-toxic appearance. She does not appear ill. No distress.  HENT:   Head: Normocephalic and atraumatic.  Right Ear: External ear normal.  Left Ear: External ear normal.  Nose: Nose normal. No mucosal edema or rhinorrhea.  Mouth/Throat: Oropharynx is clear and moist and mucous membranes are normal. No dental abscesses or uvula swelling.  Eyes: Conjunctivae and EOM are normal. Pupils are equal, round, and reactive to light.  Neck: Normal range of motion and full passive range of motion without pain. Neck supple.  Cardiovascular: Normal rate, regular rhythm and normal heart sounds.  Exam reveals no gallop and no friction rub.   No murmur heard. Pulmonary/Chest: Effort normal and breath sounds normal. No respiratory distress. She has no wheezes. She has no rhonchi. She has no rales. She exhibits no tenderness and no crepitus.  Abdominal: Soft. Normal appearance and bowel sounds are normal. She exhibits no distension. There is no tenderness. There is no rebound and no guarding.    Musculoskeletal: Normal range of motion. She exhibits no edema and no tenderness.  Moves all extremities well.   Neurological: She is alert and oriented to person, place, and time. She has normal strength. No cranial nerve deficit.  Skin: Skin is warm, dry and intact. No rash noted. No erythema. No pallor.  Very dry peeling skin especially on extremities, nose  Psychiatric: She has a normal mood and affect. Her speech is normal and behavior is normal. Her mood appears not anxious.    ED Course  Procedures (including critical care time)  Medications  metoCLOPramide (REGLAN) injection 10 mg (10 mg Intramuscular Given 05/01/14 1609)  gi cocktail (Maalox,Lidocaine,Donnatal) (30 mLs Oral Given 05/01/14 1609)  diphenhydrAMINE (BENADRYL) injection 50 mg (50 mg Intramuscular Given 05/01/14 1608)    DIAGNOSTIC STUDIES: Oxygen Saturation is 97% on RA, normal by my interpretation.    COORDINATION OF CARE: 12:47 AM Discussed treatment plan with pt at bedside and pt agreed to plan. Pt chose  to get IM medications instead of IV. I have explained to her that I cannot write for more narcotic pain medication because she has a pain contract.   Review of the [place] shows patient gets #60 OxyContin 80 mg tablets monthly, the last was April 24 which was a month supply prescribed from Dr. Humphrey Rolls and Dr. Winfred Burn at [facility] in [facility]. She also gets #90 diazepam 5 mg tablets about the same time of the month from the same positions.  Pt most recent AP CT scan was in Oct 2014 and was normal.   Labs Review Results for orders placed during the hospital encounter of 05/01/14  CBC WITH DIFFERENTIAL      Result Value Ref Range  WBC 5.4  4.0 - 10.5 K/uL   RBC 2.54 (*) 3.87 - 5.11 MIL/uL   Hemoglobin 10.6 (*) 12.0 - 15.0 g/dL   HCT 29.8 (*) 36.0 - 46.0 %   MCV 117.3 (*) 78.0 - 100.0 fL   MCH 41.7 (*) 26.0 - 34.0 pg   MCHC 35.6  30.0 - 36.0 g/dL   RDW 13.6  11.5 - 15.5 %   Platelets 134 (*) 150 - 400 K/uL   Neutrophils Relative % 63  43 - 77 %   Lymphocytes Relative 32  12 - 46 %   Monocytes Relative 4  3 - 12 %   Eosinophils Relative 1  0 - 5 %   Basophils Relative 0  0 - 1 %   Neutro Abs 3.4  1.7 - 7.7 K/uL   Lymphs Abs 1.7  0.7 - 4.0 K/uL   Monocytes Absolute 0.2  0.1 - 1.0 K/uL   Eosinophils Absolute 0.1  0.0 - 0.7 K/uL   Basophils Absolute 0.0  0.0 - 0.1 K/uL   WBC Morphology ATYPICAL LYMPHOCYTES    COMPREHENSIVE METABOLIC PANEL      Result Value Ref Range   Sodium 139  137 - 147 mEq/L   Potassium 3.6 (*) 3.7 - 5.3 mEq/L   Chloride 97  96 - 112 mEq/L   CO2 30  19 - 32 mEq/L   Glucose, Bld 190 (*) 70 - 99 mg/dL   BUN 10  6 - 23 mg/dL   Creatinine, Ser 0.87  0.50 - 1.10 mg/dL   Calcium 9.8  8.4 - 10.5 mg/dL   Total Protein 8.0  6.0 - 8.3 g/dL   Albumin 4.1  3.5 - 5.2 g/dL   AST 14  0 - 37 U/L   ALT 11  0 - 35 U/L   Alkaline Phosphatase 120 (*) 39 - 117 U/L   Total Bilirubin 0.9  0.3 - 1.2 mg/dL   GFR calc non Af Amer 74 (*) >90  mL/min   GFR calc Af Amer 86 (*) >90 mL/min  LIPASE, BLOOD      Result Value Ref Range   Lipase 29  11 - 59 U/L  URINALYSIS, ROUTINE W REFLEX MICROSCOPIC      Result Value Ref Range   Color, Urine YELLOW  YELLOW   APPearance CLEAR  CLEAR   Specific Gravity, Urine 1.010  1.005 - 1.030   pH 6.5  5.0 - 8.0   Glucose, UA NEGATIVE  NEGATIVE mg/dL   Hgb urine dipstick TRACE (*) NEGATIVE   Bilirubin Urine NEGATIVE  NEGATIVE   Ketones, ur NEGATIVE  NEGATIVE mg/dL   Protein, ur NEGATIVE  NEGATIVE mg/dL   Urobilinogen, UA 1.0  0.0 - 1.0 mg/dL   Nitrite NEGATIVE  NEGATIVE   Leukocytes, UA NEGATIVE  NEGATIVE  PREGNANCY, URINE      Result Value Ref Range   Preg Test, Ur NEGATIVE  NEGATIVE  URINE RAPID DRUG SCREEN (HOSP PERFORMED)      Result Value Ref Range   Opiates NONE DETECTED  NONE DETECTED   Cocaine NONE DETECTED  NONE DETECTED   Benzodiazepines POSITIVE (*) NONE DETECTED   Amphetamines NONE DETECTED  NONE DETECTED   Tetrahydrocannabinol POSITIVE (*) NONE DETECTED   Barbiturates NONE DETECTED  NONE DETECTED  URINE MICROSCOPIC-ADD ON      Result Value Ref Range   Squamous Epithelial / LPF RARE  RARE   WBC, UA 0-2  <3 WBC/hpf   RBC / HPF  0-2  <3 RBC/hpf   Bacteria, UA RARE  RARE   Laboratory interpretation all normal except stable anemia, mild hypokalemia, positive UDS      Imaging Review No results found.   EKG Interpretation None      MDM   Final diagnoses:  Chronic abdominal pain    Discharge Medication List as of 05/01/2014  6:28 PM    START taking these medications   Details  promethazine (PHENERGAN) 25 MG tablet Take 1 tablet (25 mg total) by mouth every 8 (eight) hours as needed for nausea or vomiting., Starting 05/01/2014, Until Discontinued, Print        Plan discharge  Rolland Porter, MD, Alanson Aly, MD 05/02/14 [id number]

## 2014-05-01 NOTE — ED Notes (Signed)
Per lab, patient's urine specimen has to be re-collected due to a "spill". Will re-order.

## 2014-05-01 NOTE — Discharge Instructions (Signed)
Use the phenergan for nausea or vomiting. You will need to contact your pain management doctor, Dr Beatris Si to get more pain medications.    Chronic Pain Discharge Instructions  Emergency care providers appreciate that many patients coming to [place] are in severe pain and we wish to address their pain in the safest, most responsible manner.  It is important to recognize however, that the proper treatment of chronic pain differs from that of the pain of injuries and acute illnesses.  Our goal is to provide quality, safe, personalized care and we thank you for giving [place] the opportunity to serve you. The use of narcotics and related agents for chronic pain syndromes may lead to additional physical and psychological problems.  Nearly as many people die from prescription narcotics each year as die from car crashes.  Additionally, this risk is increased if such prescriptions are obtained from a variety of sources.  Therefore, only your primary care physician or a pain management specialist is able to safely treat such syndromes with narcotic medications long-term.    Documentation revealing such prescriptions have been sought from multiple sources may prohibit [place] from providing a refill or different narcotic medication.  Your name may be checked first through the [place].  This database is a record of controlled substance medication prescriptions that the patient has received.  This has been established by [facility] in an effort to eliminate the dangerous, and often life threatening, practice of obtaining multiple prescriptions from different medical providers.   If you have a chronic pain syndrome (i.e. chronic headaches, recurrent back or neck pain, dental pain, abdominal or pelvis pain without a specific diagnosis, or neuropathic pain such as fibromyalgia) or recurrent visits for the same condition without an acute diagnosis, you may be treated with non-narcotics  and other non-addictive medicines.  Allergic reactions or negative side effects that may be reported by a patient to such medications will not typically lead to the use of a narcotic analgesic or other controlled substance as an alternative.   Patients managing chronic pain with a personal physician should have provisions in place for breakthrough pain.  If you are in crisis, you should call your physician.  If your physician directs you to the emergency department, please have the doctor call and speak to our attending physician concerning your care.   When patients come to the Emergency Department (ED) with acute medical conditions in which the Emergency Department physician feels appropriate to prescribe narcotic or sedating pain medication, the physician will prescribe these in very limited quantities.  The amount of these medications will last only until you can see your primary care physician in his/her office.  Any patient who returns to the ED seeking refills should expect only non-narcotic pain medications.   In the event of an acute medical condition exists and the emergency physician feels it is necessary that the patient be given a narcotic or sedating medication -  a responsible adult driver should be present in the room prior to the medication being given by the nurse.   Prescriptions for narcotic or sedating medications that have been lost, stolen or expired will not be refilled in the Emergency Department.    Patients who have chronic pain may receive non-narcotic prescriptions until seen by their primary care physician.  It is every patients personal responsibility to maintain active prescriptions with his or her primary care physician or specialist.

## 2014-05-01 NOTE — ED Notes (Signed)
Pt not in room for discharge paperwork to be given. Unable to locate pt and gown is on the bed.

## 2014-06-19 ENCOUNTER — Encounter (HOSPITAL_COMMUNITY): Payer: Self-pay | Admitting: Emergency Medicine

## 2014-06-19 ENCOUNTER — Emergency Department (HOSPITAL_COMMUNITY): Payer: Medicare Other

## 2014-06-19 ENCOUNTER — Inpatient Hospital Stay (HOSPITAL_COMMUNITY)
Admission: EM | Admit: 2014-06-19 | Discharge: 2014-06-22 | DRG: 373 | Disposition: A | Discharge status: Home / Self Care | Payer: Medicare Other | Attending: Internal Medicine | Admitting: Internal Medicine

## 2014-06-19 DIAGNOSIS — G8929 Other chronic pain: Secondary | ICD-10-CM

## 2014-06-19 DIAGNOSIS — Z825 Family history of asthma and other chronic lower respiratory diseases: Secondary | ICD-10-CM

## 2014-06-19 DIAGNOSIS — R1013 Epigastric pain: Secondary | ICD-10-CM | POA: Diagnosis not present

## 2014-06-19 DIAGNOSIS — G894 Chronic pain syndrome: Secondary | ICD-10-CM | POA: Diagnosis present

## 2014-06-19 DIAGNOSIS — D696 Thrombocytopenia, unspecified: Secondary | ICD-10-CM | POA: Diagnosis not present

## 2014-06-19 DIAGNOSIS — A0472 Enterocolitis due to Clostridium difficile, not specified as recurrent: Principal | ICD-10-CM | POA: Diagnosis present

## 2014-06-19 DIAGNOSIS — F17200 Nicotine dependence, unspecified, uncomplicated: Secondary | ICD-10-CM | POA: Diagnosis present

## 2014-06-19 DIAGNOSIS — E876 Hypokalemia: Secondary | ICD-10-CM | POA: Diagnosis not present

## 2014-06-19 DIAGNOSIS — R6889 Other general symptoms and signs: Secondary | ICD-10-CM | POA: Diagnosis not present

## 2014-06-19 DIAGNOSIS — R197 Diarrhea, unspecified: Secondary | ICD-10-CM

## 2014-06-19 DIAGNOSIS — R7309 Other abnormal glucose: Secondary | ICD-10-CM | POA: Diagnosis present

## 2014-06-19 DIAGNOSIS — I951 Orthostatic hypotension: Secondary | ICD-10-CM | POA: Diagnosis not present

## 2014-06-19 DIAGNOSIS — D649 Anemia, unspecified: Secondary | ICD-10-CM

## 2014-06-19 DIAGNOSIS — E039 Hypothyroidism, unspecified: Secondary | ICD-10-CM

## 2014-06-19 DIAGNOSIS — R109 Unspecified abdominal pain: Secondary | ICD-10-CM

## 2014-06-19 DIAGNOSIS — L02412 Cutaneous abscess of left axilla: Secondary | ICD-10-CM

## 2014-06-19 DIAGNOSIS — R112 Nausea with vomiting, unspecified: Secondary | ICD-10-CM

## 2014-06-19 DIAGNOSIS — K7689 Other specified diseases of liver: Secondary | ICD-10-CM | POA: Diagnosis present

## 2014-06-19 DIAGNOSIS — R5381 Other malaise: Secondary | ICD-10-CM | POA: Diagnosis not present

## 2014-06-19 DIAGNOSIS — D539 Nutritional anemia, unspecified: Secondary | ICD-10-CM | POA: Diagnosis present

## 2014-06-19 DIAGNOSIS — R5383 Other fatigue: Secondary | ICD-10-CM | POA: Diagnosis not present

## 2014-06-19 DIAGNOSIS — R110 Nausea: Secondary | ICD-10-CM | POA: Diagnosis not present

## 2014-06-19 DIAGNOSIS — D518 Other vitamin B12 deficiency anemias: Secondary | ICD-10-CM | POA: Diagnosis not present

## 2014-06-19 HISTORY — DX: Pain, unspecified: R52

## 2014-06-19 HISTORY — DX: Nausea with vomiting, unspecified: R11.2

## 2014-06-19 HISTORY — DX: Diarrhea, unspecified: R19.7

## 2014-06-19 LAB — COMPREHENSIVE METABOLIC PANEL
ALBUMIN: 3 g/dL — AB (ref 3.5–5.2)
ALK PHOS: 159 U/L — AB (ref 39–117)
ALT: 9 U/L (ref 0–35)
AST: 13 U/L (ref 0–37)
Anion gap: 13 (ref 5–15)
BUN: 15 mg/dL (ref 6–23)
CO2: 30 mEq/L (ref 19–32)
Calcium: 8.5 mg/dL (ref 8.4–10.5)
Chloride: 91 mEq/L — ABNORMAL LOW (ref 96–112)
Creatinine, Ser: 0.66 mg/dL (ref 0.50–1.10)
GFR calc Af Amer: >90 mL/min (ref >90)
GFR calc non Af Amer: >90 mL/min (ref >90)
Glucose, Bld: 182 mg/dL — ABNORMAL HIGH (ref 70–99)
POTASSIUM: 3.4 meq/L — AB (ref 3.7–5.3)
SODIUM: 134 meq/L — AB (ref 137–147)
Total Bilirubin: 1.3 mg/dL — ABNORMAL HIGH (ref 0.3–1.2)
Total Protein: 6.7 g/dL (ref 6.0–8.3)

## 2014-06-19 LAB — CBC WITH DIFFERENTIAL/PLATELET
Basophils Absolute: 0 10*3/uL (ref 0.0–0.1)
Basophils Relative: 0 % (ref 0–1)
EOS PCT: 0 % (ref 0–5)
Eosinophils Absolute: 0 10*3/uL (ref 0.0–0.7)
HEMATOCRIT: 21.8 % — AB (ref 36.0–46.0)
Hemoglobin: 7.6 g/dL — ABNORMAL LOW (ref 12.0–15.0)
Lymphocytes Relative: 27 % (ref 12–46)
Lymphs Abs: 1.9 10*3/uL (ref 0.7–4.0)
MCH: 39.4 pg — ABNORMAL HIGH (ref 26.0–34.0)
MCHC: 34.9 g/dL (ref 30.0–36.0)
MCV: 113 fL — ABNORMAL HIGH (ref 78.0–100.0)
MONOS PCT: 3 % (ref 3–12)
Monocytes Absolute: 0.2 10*3/uL (ref 0.1–1.0)
NEUTROS ABS: 4.9 10*3/uL (ref 1.7–7.7)
Neutrophils Relative %: 70 % (ref 43–77)
PLATELETS: 105 10*3/uL — AB (ref 150–400)
RBC: 1.93 MIL/uL — AB (ref 3.87–5.11)
RDW: 14 % (ref 11.5–15.5)
WBC: 7 10*3/uL (ref 4.0–10.5)

## 2014-06-19 LAB — URINE MICROSCOPIC-ADD ON

## 2014-06-19 LAB — URINALYSIS, ROUTINE W REFLEX MICROSCOPIC
Glucose, UA: NEGATIVE mg/dL
Ketones, ur: NEGATIVE mg/dL
Leukocytes, UA: NEGATIVE
Nitrite: NEGATIVE
SPECIFIC GRAVITY, URINE: 1.01 (ref 1.005–1.030)
Urobilinogen, UA: >8 mg/dL — ABNORMAL HIGH (ref 0.0–1.0)
pH: 6.5 (ref 5.0–8.0)

## 2014-06-19 LAB — LIPASE, BLOOD: Lipase: 50 U/L (ref 11–59)

## 2014-06-19 LAB — TROPONIN I: Troponin I: <0.3 ng/mL (ref <0.30)

## 2014-06-19 MED ORDER — VITAMIN B-1 100 MG PO TABS
100.0000 mg | ORAL_TABLET | Freq: Every day | ORAL | Status: DC
  Administered 2014-06-19 – 2014-06-22 (×4): 100 mg via ORAL
  Filled 2014-06-19 (×4): qty 1

## 2014-06-19 MED ORDER — HEPARIN SODIUM (PORCINE) 5000 UNIT/ML IJ SOLN
5000.0000 [IU] | Freq: Three times a day (TID) | INTRAMUSCULAR | Status: DC
  Administered 2014-06-20 (×2): 5000 [IU] via SUBCUTANEOUS
  Filled 2014-06-19 (×3): qty 1

## 2014-06-19 MED ORDER — ALUM & MAG HYDROXIDE-SIMETH 200-200-20 MG/5ML PO SUSP: 30.0000 mL | Freq: Four times a day (QID) | ORAL | Status: DC | PRN

## 2014-06-19 MED ORDER — IOHEXOL 300 MG/ML  SOLN
100.0000 mL | Freq: Once | INTRAMUSCULAR | Status: AC | PRN
  Administered 2014-06-19: 100 mL via INTRAVENOUS

## 2014-06-19 MED ORDER — OXYCODONE HCL ER 80 MG PO T12A
80.0000 mg | EXTENDED_RELEASE_TABLET | Freq: Two times a day (BID) | ORAL | Status: DC
  Administered 2014-06-20 – 2014-06-22 (×6): 80 mg via ORAL
  Filled 2014-06-19 (×6): qty 1

## 2014-06-19 MED ORDER — POTASSIUM CHLORIDE 20 MEQ PO PACK
PACK | ORAL | Status: AC
  Filled 2014-06-19: qty 2

## 2014-06-19 MED ORDER — DIPHENHYDRAMINE HCL 25 MG PO CAPS: 25.0000 mg | ORAL_CAPSULE | Freq: Every evening | ORAL | Status: DC | PRN

## 2014-06-19 MED ORDER — ZOLPIDEM TARTRATE 5 MG PO TABS: 5.0000 mg | ORAL_TABLET | Freq: Every evening | ORAL | Status: DC | PRN

## 2014-06-19 MED ORDER — PROMETHAZINE HCL 25 MG/ML IJ SOLN
12.5000 mg | Freq: Once | INTRAMUSCULAR | Status: AC
  Administered 2014-06-19: 12.5 mg via INTRAVENOUS
  Filled 2014-06-19: qty 1

## 2014-06-19 MED ORDER — SENNOSIDES-DOCUSATE SODIUM 8.6-50 MG PO TABS
1.0000 | ORAL_TABLET | Freq: Every evening | ORAL | Status: DC | PRN
  Administered 2014-06-20: 1 via ORAL
  Filled 2014-06-19: qty 1

## 2014-06-19 MED ORDER — ONDANSETRON HCL 4 MG PO TABS
4.0000 mg | ORAL_TABLET | Freq: Four times a day (QID) | ORAL | Status: DC | PRN
  Administered 2014-06-20 (×2): 4 mg via ORAL
  Filled 2014-06-19 (×2): qty 1

## 2014-06-19 MED ORDER — SODIUM CHLORIDE 0.9 % IV SOLN
INTRAVENOUS | Status: DC
  Administered 2014-06-19 – 2014-06-22 (×4): via INTRAVENOUS

## 2014-06-19 MED ORDER — SORBITOL 70 % SOLN: 30.0000 mL | Freq: Every day | Status: DC | PRN

## 2014-06-19 MED ORDER — SODIUM CHLORIDE 0.9 % IV BOLUS (SEPSIS): 500.0000 mL | Freq: Once | INTRAVENOUS | Status: DC

## 2014-06-19 MED ORDER — ONDANSETRON HCL 4 MG/2ML IJ SOLN
4.0000 mg | Freq: Once | INTRAMUSCULAR | Status: AC
  Administered 2014-06-19: 4 mg via INTRAVENOUS
  Filled 2014-06-19: qty 2

## 2014-06-19 MED ORDER — POTASSIUM CHLORIDE 20 MEQ PO PACK
40.0000 meq | PACK | Freq: Once | ORAL | Status: AC
  Administered 2014-06-20: 40 meq via ORAL
  Filled 2014-06-19: qty 2

## 2014-06-19 MED ORDER — ADULT MULTIVITAMIN W/MINERALS CH
1.0000 | ORAL_TABLET | Freq: Every day | ORAL | Status: DC
  Administered 2014-06-19 – 2014-06-22 (×4): 1 via ORAL
  Filled 2014-06-19 (×4): qty 1

## 2014-06-19 MED ORDER — MORPHINE SULFATE 2 MG/ML IJ SOLN
1.0000 mg | INTRAMUSCULAR | Status: DC | PRN
  Administered 2014-06-19 – 2014-06-22 (×10): 1 mg via INTRAVENOUS
  Filled 2014-06-19 (×10): qty 1

## 2014-06-19 MED ORDER — ONDANSETRON HCL 4 MG/2ML IJ SOLN
4.0000 mg | Freq: Four times a day (QID) | INTRAMUSCULAR | Status: DC | PRN
  Filled 2014-06-19: qty 2

## 2014-06-19 MED ORDER — SODIUM CHLORIDE 0.9 % IV BOLUS (SEPSIS)
500.0000 mL | Freq: Once | INTRAVENOUS | Status: AC
  Administered 2014-06-19: 500 mL via INTRAVENOUS

## 2014-06-19 MED ORDER — BIOTENE DRY MOUTH MT LIQD
15.0000 mL | Freq: Two times a day (BID) | OROMUCOSAL | Status: DC
  Administered 2014-06-19 – 2014-06-22 (×6): 15 mL via OROMUCOSAL

## 2014-06-19 MED ORDER — IOHEXOL 300 MG/ML  SOLN
50.0000 mL | Freq: Once | INTRAMUSCULAR | Status: AC | PRN
  Administered 2014-06-19: 50 mL via ORAL

## 2014-06-19 MED ORDER — GI COCKTAIL ~~LOC~~
30.0000 mL | Freq: Once | ORAL | Status: AC
  Administered 2014-06-19: 30 mL via ORAL
  Filled 2014-06-19: qty 30

## 2014-06-19 MED ORDER — FENTANYL CITRATE 0.05 MG/ML IJ SOLN
50.0000 ug | INTRAMUSCULAR | Status: DC | PRN
  Administered 2014-06-19: 50 ug via INTRAVENOUS
  Filled 2014-06-19: qty 2

## 2014-06-19 MED ORDER — ACETAMINOPHEN 325 MG PO TABS: 650.0000 mg | ORAL_TABLET | Freq: Four times a day (QID) | ORAL | Status: DC | PRN

## 2014-06-19 MED ORDER — DOCUSATE SODIUM 100 MG PO CAPS
100.0000 mg | ORAL_CAPSULE | Freq: Two times a day (BID) | ORAL | Status: DC
Start: 2014-06-19 — End: 2014-06-22
  Administered 2014-06-20 – 2014-06-21 (×4): 100 mg via ORAL
  Filled 2014-06-19 (×4): qty 1

## 2014-06-19 MED ORDER — MAGNESIUM CITRATE PO SOLN: 1.0000 | Freq: Once | ORAL | Status: AC | PRN

## 2014-06-19 MED ORDER — ACETAMINOPHEN 650 MG RE SUPP: 650.0000 mg | Freq: Four times a day (QID) | RECTAL | Status: DC | PRN

## 2014-06-19 MED ORDER — FOLIC ACID 1 MG PO TABS
1.0000 mg | ORAL_TABLET | Freq: Every day | ORAL | Status: DC
  Administered 2014-06-20 – 2014-06-22 (×4): 1 mg via ORAL
  Filled 2014-06-19 (×4): qty 1

## 2014-06-19 MED ORDER — SODIUM CHLORIDE 0.9 % IV SOLN
INTRAVENOUS | Status: DC
  Administered 2014-06-19: 16:00:00 via INTRAVENOUS

## 2014-06-19 MED ORDER — CIPROFLOXACIN HCL 250 MG PO TABS
500.0000 mg | ORAL_TABLET | Freq: Two times a day (BID) | ORAL | Status: DC
  Administered 2014-06-20 – 2014-06-21 (×5): 500 mg via ORAL
  Filled 2014-06-19 (×5): qty 2

## 2014-06-19 MED ORDER — LEVOTHYROXINE SODIUM 75 MCG PO TABS
175.0000 ug | ORAL_TABLET | Freq: Every day | ORAL | Status: DC
  Administered 2014-06-20 – 2014-06-22 (×3): 175 ug via ORAL
  Filled 2014-06-19 (×6): qty 1

## 2014-06-19 MED ORDER — SUMATRIPTAN SUCCINATE 50 MG PO TABS
100.0000 mg | ORAL_TABLET | ORAL | Status: DC | PRN
  Administered 2014-06-21: 100 mg via ORAL
  Filled 2014-06-19: qty 1

## 2014-06-19 MED ORDER — DIAZEPAM 5 MG PO TABS
5.0000 mg | ORAL_TABLET | Freq: Three times a day (TID) | ORAL | Status: DC
  Administered 2014-06-20 – 2014-06-22 (×8): 5 mg via ORAL
  Filled 2014-06-19 (×8): qty 1

## 2014-06-19 MED ORDER — FAMOTIDINE IN NACL 20-0.9 MG/50ML-% IV SOLN
20.0000 mg | Freq: Once | INTRAVENOUS | Status: AC
Start: 2014-06-19 — End: 2014-06-19
  Administered 2014-06-19: 20 mg via INTRAVENOUS
  Filled 2014-06-19: qty 50

## 2014-06-19 NOTE — Progress Notes (Signed)
Spoke with MD K.Schorr imaging not indicated at this time, Pt neurologically intact with no pain or visible signs of trauma. Will continue to monitor.

## 2014-06-19 NOTE — ED Provider Notes (Signed)
CSN: [id number]     Arrival date & time 06/19/14  1350 History   First MD Initiated Contact with Patient 06/19/14 1422     Chief Complaint  Patient presents with  . Weakness  . Abdominal Pain  . Nausea      HPI Pt was seen at 1425.  Per pt, c/o gradual onset and persistence of constant acute flair of her chronic upper abd "pain" for the past 7+ months, worse over the past 4 to 5 days.  Has been associated with multiple intermittent episodes of N/V/D. States she has been unable to take PO due to N/V. Has been associated with generalized weakness/fatigue. Describes the abd pain as "aching" and "burning," and similar to her usual chronic pain pattern. Pt states she has been in Pain Management "for the past 25 years for this" and "no one can tell me what it is."   Denies fevers, no back pain, no rash, no CP/SOB, no black or blood in stools or emesis. The symptoms have been associated with no other complaints. The patient has a significant history of similar symptoms previously, recently being evaluated for this complaint and multiple prior evals for same.       Past Medical History  Diagnosis Date  . Chronic pain   . Chronic migraine   . Chronic abdominal pain     since approximately 1991  . Nausea, vomiting, and diarrhea     recurrent, chronic  . Pain management    Past Surgical History  Procedure Laterality Date  . Cesarean section    . Cholecystectomy    . Abdominal hysterectomy    . Abdominal surgery     Family History  Problem Relation Age of Onset  . Asthma Mother   . Cancer Father    History  Substance Use Topics  . Smoking status: Current Every Day Smoker -- 1.00 packs/day    Types: Cigarettes  . Smokeless tobacco: Never Used  . Alcohol Use: No    Review of Systems ROS: Statement: All systems negative except as marked or noted in the HPI; Constitutional: Negative for fever and chills. +generalized weakness/fatigue, lightheadedness.; ; Eyes: Negative for eye pain,  redness and discharge. ; ; ENMT: Negative for ear pain, hoarseness, nasal congestion, sinus pressure and sore throat. ; ; Cardiovascular: Negative for chest pain, palpitations, diaphoresis, dyspnea and peripheral edema. ; ; Respiratory: Negative for cough, wheezing and stridor. ; ; Gastrointestinal: +N/V/D, abd pain. Negative for blood in stool, hematemesis, jaundice and rectal bleeding. . ; ; Genitourinary: Negative for dysuria, flank pain and hematuria. ; ; Musculoskeletal: Negative for back pain and neck pain. Negative for swelling and trauma.; ; Skin: Negative for pruritus, rash, abrasions, blisters, bruising and skin lesion.; ; Neuro: Negative for headache and neck stiffness. Negative for altered level of consciousness , altered mental status, extremity weakness, paresthesias, involuntary movement, seizure and syncope.     Allergies  Penicillins and Sulfa antibiotics  Home Medications   Prior to Admission medications   Medication Sig Start Date End Date Taking? Authorizing Provider  diazepam (VALIUM) 5 MG tablet Take 5 mg by mouth 3 (three) times daily.   Yes Historical Provider, MD  levothyroxine (SYNTHROID, LEVOTHROID) 175 MCG tablet Take 1 tablet (175 mcg total) by mouth daily before breakfast. 04/08/14  Yes Maudry Diego, MD  OxyCODONE (OXYCONTIN) 80 mg T12A 12 hr tablet Take 80 mg by mouth every 12 (twelve) hours.   Yes Historical Provider, MD  SUMAtriptan (IMITREX) 100 MG  tablet Take 100 mg by mouth every 2 (two) hours as needed for migraine.   Yes Historical Provider, MD  diphenhydrAMINE (BENADRYL) 25 mg capsule Take 25 mg by mouth at bedtime as needed for allergies.    Historical Provider, MD   BP 122/57  Pulse 92  Temp(Src) 98.1 F (36.7 C) (Oral)  Resp 18  SpO2 100% Physical Exam 1430: Physical examination:  Nursing notes reviewed; Vital signs and O2 SAT reviewed;  Constitutional: Well developed, Well nourished, Well hydrated, In no acute distress; Head:  Normocephalic,  atraumatic; Eyes: EOMI, PERRL, No scleral icterus; ENMT: Mouth and pharynx normal, Mucous membranes moist; Neck: Supple, Full range of motion, No lymphadenopathy; Cardiovascular: Regular rate and rhythm, No murmur, rub, or gallop; Respiratory: Breath sounds clear & equal bilaterally, No rales, rhonchi, wheezes.  Speaking full sentences with ease, Normal respiratory effort/excursion; Chest: Nontender, Movement normal; Abdomen: Soft, +mid-epigastric tenderness to palp. No rebound or guarding. Nondistended, Normal bowel sounds. Rectal exam performed w/permission of pt and ED RN chaperone present.  Anal tone normal.  Non-tender, soft brown stool in rectal vault, heme neg.  No fissures, no external hemorrhoids, no palp masses.; Genitourinary: No CVA tenderness; Extremities: Pulses normal, No tenderness, No edema, No calf edema or asymmetry.; Neuro: AA&Ox3, Major CN grossly intact.  Speech clear. No gross focal motor or sensory deficits in extremities.; Skin: Color normal, Warm, Dry.   ED Course  Procedures     EKG Interpretation   Date/Time:  Wednesday June 19 2014 13:56:44 EDT Ventricular Rate:  92 PR Interval:  158 QRS Duration: 96 QT Interval:  415 QTC Calculation: 513 R Axis:   70 Text Interpretation:  Sinus rhythm Abnormal R-wave progression, early  transition Borderline repolarization abnormality Prolonged QT interval  Baseline wander When compared with ECG of 09/18/2013 QT has lengthened  Confirmed by [facility]  MD, Nunzio Cory [phone]) on 06/19/2014 3:05:08 PM      MDM  MDM Reviewed: previous chart, nursing note and vitals Reviewed previous: labs and ECG Interpretation: labs, ECG, x-ray and CT scan Total time providing critical care: 30-74 minutes. This excludes time spent performing separately reportable procedures and services. Consults: admitting MD   CRITICAL CARE Performed by: Alfonzo Feller Total critical care time: 35 Critical care time was exclusive of separately billable  procedures and treating other patients. Critical care was necessary to treat or prevent imminent or life-threatening deterioration. Critical care was time spent personally by me on the following activities: development of treatment plan with patient and/or surrogate as well as nursing, discussions with consultants, evaluation of patient's response to treatment, examination of patient, obtaining history from patient or surrogate, ordering and performing treatments and interventions, ordering and review of laboratory studies, ordering and review of radiographic studies, pulse oximetry and re-evaluation of patient's condition.    Results for orders placed during the hospital encounter of 06/19/14  URINALYSIS, ROUTINE W REFLEX MICROSCOPIC      Result Value Ref Range   Color, Urine AMBER (*) YELLOW   APPearance CLEAR  CLEAR   Specific Gravity, Urine 1.010  1.005 - 1.030   pH 6.5  5.0 - 8.0   Glucose, UA NEGATIVE  NEGATIVE mg/dL   Hgb urine dipstick TRACE (*) NEGATIVE   Bilirubin Urine MODERATE (*) NEGATIVE   Ketones, ur NEGATIVE  NEGATIVE mg/dL   Protein, ur TRACE (*) NEGATIVE mg/dL   Urobilinogen, UA >8.0 (*) 0.0 - 1.0 mg/dL   Nitrite NEGATIVE  NEGATIVE   Leukocytes, UA NEGATIVE  NEGATIVE  CBC  WITH DIFFERENTIAL      Result Value Ref Range   WBC 7.0  4.0 - 10.5 K/uL   RBC 1.93 (*) 3.87 - 5.11 MIL/uL   Hemoglobin 7.6 (*) 12.0 - 15.0 g/dL   HCT 21.8 (*) 36.0 - 46.0 %   MCV 113.0 (*) 78.0 - 100.0 fL   MCH 39.4 (*) 26.0 - 34.0 pg   MCHC 34.9  30.0 - 36.0 g/dL   RDW 14.0  11.5 - 15.5 %   Platelets 105 (*) 150 - 400 K/uL   Neutrophils Relative % 70  43 - 77 %   Lymphocytes Relative 27  12 - 46 %   Monocytes Relative 3  3 - 12 %   Eosinophils Relative 0  0 - 5 %   Basophils Relative 0  0 - 1 %   Neutro Abs 4.9  1.7 - 7.7 K/uL   Lymphs Abs 1.9  0.7 - 4.0 K/uL   Monocytes Absolute 0.2  0.1 - 1.0 K/uL   Eosinophils Absolute 0.0  0.0 - 0.7 K/uL   Basophils Absolute 0.0  0.0 - 0.1 K/uL   RBC  Morphology RARE NRBCs     Smear Review PLATELET COUNT CONFIRMED BY SMEAR    COMPREHENSIVE METABOLIC PANEL      Result Value Ref Range   Sodium 134 (*) 137 - 147 mEq/L   Potassium 3.4 (*) 3.7 - 5.3 mEq/L   Chloride 91 (*) 96 - 112 mEq/L   CO2 30  19 - 32 mEq/L   Glucose, Bld 182 (*) 70 - 99 mg/dL   BUN 15  6 - 23 mg/dL   Creatinine, Ser 0.66  0.50 - 1.10 mg/dL   Calcium 8.5  8.4 - 10.5 mg/dL   Total Protein 6.7  6.0 - 8.3 g/dL   Albumin 3.0 (*) 3.5 - 5.2 g/dL   AST 13  0 - 37 U/L   ALT 9  0 - 35 U/L   Alkaline Phosphatase 159 (*) 39 - 117 U/L   Total Bilirubin 1.3 (*) 0.3 - 1.2 mg/dL   GFR calc non Af Amer >90  >90 mL/min   GFR calc Af Amer >90  >90 mL/min   Anion gap 13  5 - 15  LIPASE, BLOOD      Result Value Ref Range   Lipase 50  11 - 59 U/L  TROPONIN I      Result Value Ref Range   Troponin I <0.30  <0.30 ng/mL  URINE MICROSCOPIC-ADD ON      Result Value Ref Range   Squamous Epithelial / LPF FEW (*) RARE   WBC, UA 0-2  <3 WBC/hpf   RBC / HPF 0-2  <3 RBC/hpf   Bacteria, UA RARE  RARE   Dg Chest 2 View 06/19/2014   CLINICAL DATA:  Weakness with nausea and vomiting.  EXAM: CHEST  2 VIEW  COMPARISON:  04/08/2014.  FINDINGS: Lungs are hyperexpanded. No edema or focal airspace consolidation. The cardio pericardial silhouette is enlarged. Telemetry leads overlie the chest. Imaged bony structures of the thorax are intact.  IMPRESSION: Stable.  No acute cardiopulmonary findings.   Electronically Signed   By: Misty Stanley M.D.   On: 06/19/2014 17:14   Ct Abdomen Pelvis W Contrast 06/19/2014   CLINICAL DATA:  Weakness, abdominal pain, vomiting and diarrhea for 7 months, status post hysterectomy and cholecystectomy.  EXAM: CT ABDOMEN AND PELVIS WITH CONTRAST  TECHNIQUE: Multidetector CT imaging of the abdomen and pelvis  was performed using the standard protocol following bolus administration of intravenous contrast.  CONTRAST:  150m OMNIPAQUE IOHEXOL 300 MG/ML SOLN, 557mOMNIPAQUE IOHEXOL  300 MG/ML SOLN  COMPARISON:  09/19/2013  FINDINGS: Sagittal images of the spine are unremarkable. Atherosclerotic calcification and plaques are noted abdominal aorta and iliac arteries. No aortic aneurysm. There is hepatic fatty infiltration. The patient is status postcholecystectomy. Small pneumobilia probable postcholecystectomy in left hepatic lobe. The pancreas, spleen and adrenal glands are unremarkable. Kidneys are symmetrical in size and enhancement. No hydronephrosis or hydroureter. There is nonobstructive calcified calculus in midpole of the right kidney measures 3 mm. No calcified ureteral calculi are noted.  There is no pericecal inflammation. There is nonspecific mild thickening of the right colonic wall with fatty appearance. This is best visualized in coronal image 44. Nonspecific mild colitis or inflammatory bowel disease cannot be excluded. Clinical correlation is necessary.  No small bowel or colonic obstruction  No distal colonic obstruction. The patient is status post hysterectomy. No pelvic ascites or adenopathy. No destructive bony lesions are noted within pelvis.  Delayed renal images shows bilateral renal symmetrical excretion.  IMPRESSION: 1. Fatty infiltration of the liver. Status post cholecystectomy. Mild pneumobilia in left hepatic lobe. 2. No hydronephrosis or hydroureter. 3. Nonspecific mild fatty thickening of right colonic wall. This is best visualized in coronal image 44. Nonspecific mild colitis cannot be excluded. Clinical correlation is necessary. 4. No pericecal inflammation. No small bowel or colonic obstruction. 5. Status post hysterectomy.   Electronically Signed   By: LiLahoma Crocker.D.   On: 06/19/2014 17:08   Results for COLAGENA, STRANDMRN [id number] of 06/19/2014 17:37  Ref. Range 09/18/2013 16:42 09/19/2013 05:13 04/08/2014 09:55 05/01/2014 15:42 06/19/2014 15:24  Hemoglobin Latest Range: 12.0-15.0 g/dL 12.8 13.5 12.7 10.6 (L) 7.6 (L)  HCT Latest Range: 36.0-46.0 %  36.9 38.6 35.2 (L) 29.8 (L) 21.8 (L)  Platelets Latest Range: 150-400 K/uL 152 173 89 (L) 134 (L) 105 (L)    1730:  Initial BP lower than pt's usual baseline; IVF bolus given with improvement. H/H lower than previous and orthostatic on VS; will type/screen/transfuse PRBC's. Stool is heme negative. Dx and testing d/w pt and family.  Questions answered.  Verb understanding, agreeable to admit.  T/C to [facility] Dr. KhHumphrey Rollscase discussed, including:  HPI, pertinent PM/SHx, VS/PE, dx testing, ED course and treatment:  Agreeable to admit, requests he will come to the ED for evaluation.     KaAlfonzo FellerDO 06/20/14 [phone]

## 2014-06-19 NOTE — ED Notes (Signed)
Nurse made aware of low BP and Pt felt dizzy.  Dr. Thurnell Garbe informed.  Pressure rechecked by Bethena Roys and it was 61/51.  After bolus  Started  It went up to 66/55.

## 2014-06-19 NOTE — ED Notes (Signed)
Stool hemo occult negative

## 2014-06-19 NOTE — ED Notes (Signed)
Pt comes from home via EMS with c/o generalized weakness and decreased appetite x4-5 days. Pt states "I've barely eaten anything in 4 or 5 days". Pt also reports intermittent generalized abdominal pain as well as intermittent n/v/d.

## 2014-06-19 NOTE — H&P (Addendum)
[facility] History and Physical  Deborah Russo ZOX:[id number] DOB: 09-May-1961 DOA: 06/19/2014  Referring physician: Francine Graven, DO PCP: No PCP Per Patient   Chief Complaint: Abdominal Pain  HPI: Deborah Russo is a 53 y.o. female with history of chronic pain syndrome presents to the ED with abdominal pain. She states that pain is a little worse than it has been. She states it is rather located diffusely. She has not had any diarrhea but she admits to having vomiting and nausea. Patient states that she has noted the pain to be more in the upper belly and is a constant discomfort. She denies having any chest pain noted. In the ED she was noted also to have a low hemoglobin when compared to her results from May. On questioning she denies taking any NSAIDs for her pain. In addition a CT scan shows presence of nonspecific colitis and a fatty liver., She does smoke and denies having any ETOH use.   Review of Systems:  Constitutional:  No weight loss, night sweats, Fevers, chills,++ fatigue.  HEENT:  No headaches, ear ache, nasal congestion, post nasal drip,  Cardio-vascular:  No chest pain, Orthopnea, PND, swelling in lower extremities  GI:  ++abdominal pain, ++nausea, ++vomiting, no diarrhea ++loss of appetite  Resp:  No shortness of breath with exertion or at rest. No wheezing  Skin:  no rash or lesions.  GU:  no dysuria, change in color of urine.  Musculoskeletal:  No joint pain or swelling. No back pain.  Psych:  ++ depression or anxiety. No memory loss.   Past Medical History  Diagnosis Date  . Chronic pain   . Chronic migraine   . Chronic abdominal pain     since approximately 1991  . Nausea, vomiting, and diarrhea     recurrent, chronic  . Pain management    Past Surgical History  Procedure Laterality Date  . Cesarean section    . Cholecystectomy    . Abdominal hysterectomy    . Abdominal surgery     Social History:  reports that she has been  smoking Cigarettes.  She has been smoking about 1.00 pack per day. She has never used smokeless tobacco. She reports that she does not drink alcohol or use illicit drugs.  Allergies  Allergen Reactions  . Penicillins Hives  . Sulfa Antibiotics Hives    Family History  Problem Relation Age of Onset  . Asthma Mother   . Cancer Father      Prior to Admission medications   Medication Sig Start Date End Date Taking? Authorizing Provider  diazepam (VALIUM) 5 MG tablet Take 5 mg by mouth 3 (three) times daily.   Yes Historical Provider, MD  levothyroxine (SYNTHROID, LEVOTHROID) 175 MCG tablet Take 1 tablet (175 mcg total) by mouth daily before breakfast. 04/08/14  Yes Maudry Diego, MD  OxyCODONE (OXYCONTIN) 80 mg T12A 12 hr tablet Take 80 mg by mouth every 12 (twelve) hours.   Yes Historical Provider, MD  SUMAtriptan (IMITREX) 100 MG tablet Take 100 mg by mouth every 2 (two) hours as needed for migraine.   Yes Historical Provider, MD  diphenhydrAMINE (BENADRYL) 25 mg capsule Take 25 mg by mouth at bedtime as needed for allergies.    Historical Provider, MD   Physical Exam: Filed Vitals:   06/19/14 1555  BP: 122/57  Pulse: 92  Temp:   Resp: 18    BP 122/57  Pulse 92  Temp(Src) 98.1 F (36.7 C) (Oral)  Resp 18  SpO2 100%  General:  Appears calm Eyes: PERRL, normal lids, irises & conjunctiva ENT: grossly normal hearing, lips & tongue Neck: no LAD, masses or thyromegaly Cardiovascular: RRR, no m/r/g. No LE edema. Telemetry: SR, no arrhythmias  Respiratory: CTA bilaterally, no w/r/r. Normal respiratory effort. Abdomen: soft, c/o tenderness in the epigastric area Skin: no rash or induration seen on limited exam Musculoskeletal: grossly normal tone BUE/BLE Psychiatric: flat affect, speech fluent Neurologic: grossly non-focal.          Labs on Admission:  Basic Metabolic Panel:  Recent Labs Lab 06/19/14 1524  NA 134*  K 3.4*  CL 91*  CO2 30  GLUCOSE 182*  BUN 15    CREATININE 0.66  CALCIUM 8.5   Liver Function Tests:  Recent Labs Lab 06/19/14 1524  AST 13  ALT 9  ALKPHOS 159*  BILITOT 1.3*  PROT 6.7  ALBUMIN 3.0*    Recent Labs Lab 06/19/14 1524  LIPASE 50   No results found for this basename: AMMONIA,  in the last 168 hours CBC:  Recent Labs Lab 06/19/14 1524  WBC 7.0  NEUTROABS 4.9  HGB 7.6*  HCT 21.8*  MCV 113.0*  PLT 105*   Cardiac Enzymes:  Recent Labs Lab 06/19/14 1524  TROPONINI <0.30    BNP (last 3 results)  Recent Labs  04/08/14 0955  PROBNP 106.1   CBG: No results found for this basename: GLUCAP,  in the last 168 hours  Radiological Exams on Admission: Dg Chest 2 View  06/19/2014   CLINICAL DATA:  Weakness with nausea and vomiting.  EXAM: CHEST  2 VIEW  COMPARISON:  04/08/2014.  FINDINGS: Lungs are hyperexpanded. No edema or focal airspace consolidation. The cardio pericardial silhouette is enlarged. Telemetry leads overlie the chest. Imaged bony structures of the thorax are intact.  IMPRESSION: Stable.  No acute cardiopulmonary findings.   Electronically Signed   By: Misty Stanley M.D.   On: 06/19/2014 17:14   Ct Abdomen Pelvis W Contrast  06/19/2014   CLINICAL DATA:  Weakness, abdominal pain, vomiting and diarrhea for 7 months, status post hysterectomy and cholecystectomy.  EXAM: CT ABDOMEN AND PELVIS WITH CONTRAST  TECHNIQUE: Multidetector CT imaging of the abdomen and pelvis was performed using the standard protocol following bolus administration of intravenous contrast.  CONTRAST:  177m OMNIPAQUE IOHEXOL 300 MG/ML SOLN, 535mOMNIPAQUE IOHEXOL 300 MG/ML SOLN  COMPARISON:  09/19/2013  FINDINGS: Sagittal images of the spine are unremarkable. Atherosclerotic calcification and plaques are noted abdominal aorta and iliac arteries. No aortic aneurysm. There is hepatic fatty infiltration. The patient is status postcholecystectomy. Small pneumobilia probable postcholecystectomy in left hepatic lobe. The pancreas,  spleen and adrenal glands are unremarkable. Kidneys are symmetrical in size and enhancement. No hydronephrosis or hydroureter. There is nonobstructive calcified calculus in midpole of the right kidney measures 3 mm. No calcified ureteral calculi are noted.  There is no pericecal inflammation. There is nonspecific mild thickening of the right colonic wall with fatty appearance. This is best visualized in coronal image 44. Nonspecific mild colitis or inflammatory bowel disease cannot be excluded. Clinical correlation is necessary.  No small bowel or colonic obstruction  No distal colonic obstruction. The patient is status post hysterectomy. No pelvic ascites or adenopathy. No destructive bony lesions are noted within pelvis.  Delayed renal images shows bilateral renal symmetrical excretion.  IMPRESSION: 1. Fatty infiltration of the liver. Status post cholecystectomy. Mild pneumobilia in left hepatic lobe. 2. No hydronephrosis or hydroureter. 3.  Nonspecific mild fatty thickening of right colonic wall. This is best visualized in coronal image 44. Nonspecific mild colitis cannot be excluded. Clinical correlation is necessary. 4. No pericecal inflammation. No small bowel or colonic obstruction. 5. Status post hysterectomy.   Electronically Signed   By: Lahoma Crocker M.D.   On: 06/19/2014 17:08      Assessment/Plan Active Problems:   Abdominal pain   Hypothyroidism   Hypokalemia   Chronic pain   Anemia   1. Chronic pain -patient relates the pain dates back to childbirth.  -She has been chronically on oxycontin -will continue with pain medications  2. Abdominal Pain -associated with anemia -will check stool occult blood -location is more in the epigastric area -CT scan shows some nonspecific colitis -will get a GI consult  3. Anemia -she has a significant drop in her hemoglobin since May -will check iron studies -will get a GI consult  4. Hypothyroidism -continue with home medications -will  check TSH level  5. Hyperglycemia -will monitor CBG -will check A1C  6. Elevated Bilirubin -related to fatty liver -GI evaluation  7. Hypokalemia -will replete with one time dose now   Code Status: Full Code (must indicate code status--if unknown or must be presumed, indicate so) Family Communication: Sister in room (indicate person spoken with, if applicable, with phone number if by telephone) Disposition Plan: Home (indicate anticipated LOS)  Time spent: 70mn  KHAN,SAADAT A [facility] Pager [phone] **Disclaimer: This note may have been dictated with voice recognition software. Similar sounding words can inadvertently be transcribed and this note may contain transcription errors which may not have been corrected upon publication of note.**

## 2014-06-19 NOTE — Progress Notes (Signed)
Pt reports fall to secretary via call bell. Upon assessment, pt noted to be sitting on side of bed. Pt states "I was using bedside commode and I slipped getting up and hit my head on computer." Pt neuro status intact, denies dizziness or lightheadedness, vitals obtained, BP 115/76, HR 80, O2 sats 98% on room air. Continuous abdominal pain reported, and no head-related pain, no visible bumps or bruises noted upon skin assessment. MD K. Schorr text-paged to make aware. Will implement high-fall risk interventions in addition to previously enforced moderate risk interventions.  Will continue to monitor.

## 2014-06-20 ENCOUNTER — Encounter: Payer: Self-pay | Admitting: Internal Medicine

## 2014-06-20 DIAGNOSIS — D649 Anemia, unspecified: Secondary | ICD-10-CM | POA: Diagnosis not present

## 2014-06-20 DIAGNOSIS — R1013 Epigastric pain: Secondary | ICD-10-CM | POA: Diagnosis not present

## 2014-06-20 DIAGNOSIS — I951 Orthostatic hypotension: Secondary | ICD-10-CM | POA: Diagnosis not present

## 2014-06-20 DIAGNOSIS — R109 Unspecified abdominal pain: Secondary | ICD-10-CM | POA: Diagnosis not present

## 2014-06-20 DIAGNOSIS — R112 Nausea with vomiting, unspecified: Secondary | ICD-10-CM | POA: Diagnosis not present

## 2014-06-20 DIAGNOSIS — R197 Diarrhea, unspecified: Secondary | ICD-10-CM

## 2014-06-20 DIAGNOSIS — G8929 Other chronic pain: Secondary | ICD-10-CM | POA: Diagnosis not present

## 2014-06-20 LAB — CBC
HEMATOCRIT: 18.5 % — AB (ref 36.0–46.0)
HEMOGLOBIN: 6.2 g/dL — AB (ref 12.0–15.0)
MCH: 38 pg — AB (ref 26.0–34.0)
MCHC: 33.5 g/dL (ref 30.0–36.0)
MCV: 113.5 fL — AB (ref 78.0–100.0)
Platelets: 81 10*3/uL — ABNORMAL LOW (ref 150–400)
RBC: 1.63 MIL/uL — AB (ref 3.87–5.11)
RDW: 14.1 % (ref 11.5–15.5)
WBC: 5.3 10*3/uL (ref 4.0–10.5)

## 2014-06-20 LAB — HEMOGLOBIN A1C
HEMOGLOBIN A1C: 5.9 % — AB (ref <5.7)
Mean Plasma Glucose: 123 mg/dL — ABNORMAL HIGH (ref <117)

## 2014-06-20 LAB — PREPARE RBC (CROSSMATCH)

## 2014-06-20 LAB — COMPREHENSIVE METABOLIC PANEL
ALT: 7 U/L (ref 0–35)
ANION GAP: 9 (ref 5–15)
AST: 9 U/L (ref 0–37)
Albumin: 2.7 g/dL — ABNORMAL LOW (ref 3.5–5.2)
Alkaline Phosphatase: 128 U/L — ABNORMAL HIGH (ref 39–117)
BUN: 13 mg/dL (ref 6–23)
CALCIUM: 8 mg/dL — AB (ref 8.4–10.5)
CO2: 29 meq/L (ref 19–32)
CREATININE: 0.67 mg/dL (ref 0.50–1.10)
Chloride: 100 mEq/L (ref 96–112)
GLUCOSE: 137 mg/dL — AB (ref 70–99)
Potassium: 3.4 mEq/L — ABNORMAL LOW (ref 3.7–5.3)
SODIUM: 138 meq/L (ref 137–147)
TOTAL PROTEIN: 5.9 g/dL — AB (ref 6.0–8.3)
Total Bilirubin: 1.1 mg/dL (ref 0.3–1.2)

## 2014-06-20 LAB — URINE CULTURE: Colony Count: 50000

## 2014-06-20 LAB — IRON AND TIBC
IRON: 247 ug/dL — AB (ref 42–135)
UIBC: <15 ug/dL — ABNORMAL LOW (ref 125–400)

## 2014-06-20 LAB — VITAMIN B12: Vitamin B-12: 147 pg/mL — ABNORMAL LOW (ref 211–911)

## 2014-06-20 LAB — ABO/RH: ABO/RH(D): B POS

## 2014-06-20 LAB — TSH: TSH: 0.608 u[IU]/mL (ref 0.350–4.500)

## 2014-06-20 MED ORDER — CYANOCOBALAMIN 1000 MCG/ML IJ SOLN
1000.0000 ug | Freq: Once | INTRAMUSCULAR | Status: AC
  Administered 2014-06-20: 1000 ug via INTRAMUSCULAR
  Filled 2014-06-20: qty 1

## 2014-06-20 MED ORDER — NICOTINE 21 MG/24HR TD PT24
21.0000 mg | MEDICATED_PATCH | Freq: Every day | TRANSDERMAL | Status: DC
  Administered 2014-06-20 – 2014-06-22 (×3): 21 mg via TRANSDERMAL
  Filled 2014-06-20 (×3): qty 1

## 2014-06-20 NOTE — Progress Notes (Signed)
UR chart review completed.  

## 2014-06-20 NOTE — Progress Notes (Signed)
CRITICAL VALUE ALERT  Critical value received:  Hgb 6.2  Date of notification:  06/20/2014    Time of notification:  0623  Critical value read back:Yes.    Nurse who received alert:  Madhav Mohon  MD notified (1st page):  Georgiann Mohs  Time of first page:  [phone]  Responding MD:  Georgiann Mohs  Time MD responded:  [id number]  Telephone ordered received to transfuse 2 units PRBS, orders verified with readback.

## 2014-06-20 NOTE — Care Management Note (Signed)
    Page 1 of 1   06/20/2014     3:21:36 PM CARE MANAGEMENT NOTE 06/20/2014  Patient:  Deborah Russo, Deborah Russo   Account Number:  [id number]  Date Initiated:  06/20/2014  Documentation initiated by:  Theophilus Kinds  Subjective/Objective Assessment:   Pt admitted from home with anemia and abd pain. Pt lives with her sister and will return home at discharge. Pt has been fairly independent with ADL's until recently. Pt has no PCP.     Action/Plan:   CM arranged PCP with Dr. Legrand Rams and appt documented on AVS. Will continue to follow for discharge planning needs.   Anticipated DC Date:  06/22/2014   Anticipated DC Plan:  [place]  CM consult      Choice offered to / List presented to:             Status of service:  Completed, signed off Medicare Important Message given?   (If response is "NO", the following Medicare IM given date fields will be blank) Date Medicare IM given:   Medicare IM given by:   Date Additional Medicare IM given:   Additional Medicare IM given by:    Discharge Disposition:  HOME/SELF CARE  Per UR Regulation:    If discussed at Long Length of Stay Meetings, dates discussed:    Comments:  06/20/14 [place], RN BSN CM

## 2014-06-20 NOTE — Consult Note (Addendum)
Reason for Consult:Abdominal pain Referring Physician: Hospitalist services  Deborah Russo is an 54 y.o. female.  HPI: Admitted thru the ED with epigastric pain. She tells me she has had the pain for 6 months to a year. The pain has progressively worsened. She tells me the pain is not constant. There has been no fever associated with her symptoms. She says she has not been able to eat. She has nausea on a daily basis. She cannot tell me if she has lost any weight.  She has had frequent vomiting.  She denies taking Aleve, or BC powders, or ASA. She has been going to a Pain Clinic for chronic abdominal pain. She has had chronic abdominal pain since 1991 after birth of her child. She says there was some type of nerve damage. She has been evaluated at [facility] for this pain.  Noted in the ED to have a Hemoglobin 7.1. This am her Hemoglobin 6.5. Hempglobin 2 months ago was normal. She denies seeing any blood in her stool. Stools are brown in color. No melena. She usually has a BM once a day. Stools have been more like diarrhea for the past few months.   Past Medical History  Diagnosis Date  . Chronic pain   . Chronic migraine   . Chronic abdominal pain     since approximately 1991  . Nausea, vomiting, and diarrhea     recurrent, chronic  . Pain management     Past Surgical History  Procedure Laterality Date  . Cesarean section    . Cholecystectomy    . Abdominal hysterectomy    . Abdominal surgery      Family History  Problem Relation Age of Onset  . Asthma Mother   . Cancer Father     Social History:  reports that she has been smoking Cigarettes.  She has a 7.5 pack-year smoking history. She has never used smokeless tobacco. She reports that she does not drink alcohol or use illicit drugs.  Allergies:  Allergies  Allergen Reactions  . Penicillins Hives  . Sulfa Antibiotics Hives    Medications: I have reviewed the patient's current medications.  Results for orders  placed during the hospital encounter of 06/19/14 (from the past 48 hour(s))  URINALYSIS, ROUTINE W REFLEX MICROSCOPIC     Status: Abnormal   Collection Time    06/19/14  3:10 PM      Result Value Ref Range   Color, Urine AMBER (*) YELLOW   Comment: BIOCHEMICALS MAY BE AFFECTED BY COLOR   APPearance CLEAR  CLEAR   Specific Gravity, Urine 1.010  1.005 - 1.030   pH 6.5  5.0 - 8.0   Glucose, UA NEGATIVE  NEGATIVE mg/dL   Hgb urine dipstick TRACE (*) NEGATIVE   Bilirubin Urine MODERATE (*) NEGATIVE   Ketones, ur NEGATIVE  NEGATIVE mg/dL   Protein, ur TRACE (*) NEGATIVE mg/dL   Urobilinogen, UA >8.0 (*) 0.0 - 1.0 mg/dL   Nitrite NEGATIVE  NEGATIVE   Leukocytes, UA NEGATIVE  NEGATIVE  URINE MICROSCOPIC-ADD ON     Status: Abnormal   Collection Time    06/19/14  3:10 PM      Result Value Ref Range   Squamous Epithelial / LPF FEW (*) RARE   WBC, UA 0-2  <3 WBC/hpf   RBC / HPF 0-2  <3 RBC/hpf   Bacteria, UA RARE  RARE  CBC WITH DIFFERENTIAL     Status: Abnormal   Collection Time  06/19/14  3:24 PM      Result Value Ref Range   WBC 7.0  4.0 - 10.5 K/uL   RBC 1.93 (*) 3.87 - 5.11 MIL/uL   Hemoglobin 7.6 (*) 12.0 - 15.0 g/dL   HCT 21.8 (*) 36.0 - 46.0 %   MCV 113.0 (*) 78.0 - 100.0 fL   MCH 39.4 (*) 26.0 - 34.0 pg   MCHC 34.9  30.0 - 36.0 g/dL   RDW 14.0  11.5 - 15.5 %   Platelets 105 (*) 150 - 400 K/uL   Neutrophils Relative % 70  43 - 77 %   Lymphocytes Relative 27  12 - 46 %   Monocytes Relative 3  3 - 12 %   Eosinophils Relative 0  0 - 5 %   Basophils Relative 0  0 - 1 %   Neutro Abs 4.9  1.7 - 7.7 K/uL   Lymphs Abs 1.9  0.7 - 4.0 K/uL   Monocytes Absolute 0.2  0.1 - 1.0 K/uL   Eosinophils Absolute 0.0  0.0 - 0.7 K/uL   Basophils Absolute 0.0  0.0 - 0.1 K/uL   RBC Morphology RARE NRBCs     Comment: POLYCHROMASIA PRESENT   Smear Review PLATELET COUNT CONFIRMED BY SMEAR     Comment: PLATELETS APPEAR DECREASED     LARGE PLATELETS PRESENT     GIANT PLATELETS SEEN   COMPREHENSIVE METABOLIC PANEL     Status: Abnormal   Collection Time    06/19/14  3:24 PM      Result Value Ref Range   Sodium 134 (*) 137 - 147 mEq/L   Potassium 3.4 (*) 3.7 - 5.3 mEq/L   Chloride 91 (*) 96 - 112 mEq/L   CO2 30  19 - 32 mEq/L   Glucose, Bld 182 (*) 70 - 99 mg/dL   BUN 15  6 - 23 mg/dL   Creatinine, Ser 0.66  0.50 - 1.10 mg/dL   Calcium 8.5  8.4 - 10.5 mg/dL   Total Protein 6.7  6.0 - 8.3 g/dL   Albumin 3.0 (*) 3.5 - 5.2 g/dL   AST 13  0 - 37 U/L   ALT 9  0 - 35 U/L   Alkaline Phosphatase 159 (*) 39 - 117 U/L   Total Bilirubin 1.3 (*) 0.3 - 1.2 mg/dL   GFR calc non Af Amer >90  >90 mL/min   GFR calc Af Amer >90  >90 mL/min   Comment: (NOTE)     The eGFR has been calculated using the CKD EPI equation.     This calculation has not been validated in all clinical situations.     eGFR's persistently <90 mL/min signify possible Chronic Kidney     Disease.   Anion gap 13  5 - 15  LIPASE, BLOOD     Status: None   Collection Time    06/19/14  3:24 PM      Result Value Ref Range   Lipase 50  11 - 59 U/L  TROPONIN I     Status: None   Collection Time    06/19/14  3:24 PM      Result Value Ref Range   Troponin I <0.30  <0.30 ng/mL   Comment:            Due to the release kinetics of cTnI,     a negative result within the first hours     of the onset of symptoms does not rule out  myocardial infarction with certainty.     If myocardial infarction is still suspected,     repeat the test at appropriate intervals.  TYPE AND SCREEN     Status: None   Collection Time    06/19/14  6:00 PM      Result Value Ref Range   ABO/RH(D) B POS     Antibody Screen NEG     Sample Expiration 06/22/2014     Unit Number [id number]     Blood Component Type RED CELLS,LR     Unit division 00     Status of Unit ALLOCATED     Transfusion Status OK TO TRANSFUSE     Crossmatch Result Compatible     Unit Number [id number]     Blood Component Type RBC LR PHER2     Unit  division 00     Status of Unit ALLOCATED     Transfusion Status OK TO TRANSFUSE     Crossmatch Result Compatible    ABO/RH     Status: None   Collection Time    06/19/14  6:00 PM      Result Value Ref Range   ABO/RH(D) B POS    CBC     Status: Abnormal   Collection Time    06/20/14  5:27 AM      Result Value Ref Range   WBC 5.3  4.0 - 10.5 K/uL   RBC 1.63 (*) 3.87 - 5.11 MIL/uL   Hemoglobin 6.2 (*) 12.0 - 15.0 g/dL   Comment: RESULT REPEATED AND VERIFIED     CRITICAL RESULT CALLED TO, READ BACK BY AND VERIFIED WITH:     DARLENE BRAGGS RN ON [id number] AT 0625 BY RESSEGGER R   HCT 18.5 (*) 36.0 - 46.0 %   MCV 113.5 (*) 78.0 - 100.0 fL   MCH 38.0 (*) 26.0 - 34.0 pg   MCHC 33.5  30.0 - 36.0 g/dL   RDW 14.1  11.5 - 15.5 %   Platelets 81 (*) 150 - 400 K/uL  COMPREHENSIVE METABOLIC PANEL     Status: Abnormal   Collection Time    06/20/14  5:27 AM      Result Value Ref Range   Sodium 138  137 - 147 mEq/L   Potassium 3.4 (*) 3.7 - 5.3 mEq/L   Chloride 100  96 - 112 mEq/L   Comment: DELTA CHECK NOTED   CO2 29  19 - 32 mEq/L   Glucose, Bld 137 (*) 70 - 99 mg/dL   BUN 13  6 - 23 mg/dL   Creatinine, Ser 0.67  0.50 - 1.10 mg/dL   Calcium 8.0 (*) 8.4 - 10.5 mg/dL   Total Protein 5.9 (*) 6.0 - 8.3 g/dL   Albumin 2.7 (*) 3.5 - 5.2 g/dL   AST 9  0 - 37 U/L   ALT 7  0 - 35 U/L   Alkaline Phosphatase 128 (*) 39 - 117 U/L   Total Bilirubin 1.1  0.3 - 1.2 mg/dL   GFR calc non Af Amer >90  >90 mL/min   GFR calc Af Amer >90  >90 mL/min   Comment: (NOTE)     The eGFR has been calculated using the CKD EPI equation.     This calculation has not been validated in all clinical situations.     eGFR's persistently <90 mL/min signify possible Chronic Kidney     Disease.   Anion gap 9  5 - 15  PREPARE RBC (CROSSMATCH)  Status: None   Collection Time    06/20/14  7:00 AM      Result Value Ref Range   Order Confirmation ORDER PROCESSED BY BLOOD BANK      Dg Chest 2 View  06/19/2014   CLINICAL  DATA:  Weakness with nausea and vomiting.  EXAM: CHEST  2 VIEW  COMPARISON:  04/08/2014.  FINDINGS: Lungs are hyperexpanded. No edema or focal airspace consolidation. The cardio pericardial silhouette is enlarged. Telemetry leads overlie the chest. Imaged bony structures of the thorax are intact.  IMPRESSION: Stable.  No acute cardiopulmonary findings.   Electronically Signed   By: Misty Stanley M.D.   On: 06/19/2014 17:14   Ct Abdomen Pelvis W Contrast  06/19/2014   CLINICAL DATA:  Weakness, abdominal pain, vomiting and diarrhea for 7 months, status post hysterectomy and cholecystectomy.  EXAM: CT ABDOMEN AND PELVIS WITH CONTRAST  TECHNIQUE: Multidetector CT imaging of the abdomen and pelvis was performed using the standard protocol following bolus administration of intravenous contrast.  CONTRAST:  175m OMNIPAQUE IOHEXOL 300 MG/ML SOLN, 566mOMNIPAQUE IOHEXOL 300 MG/ML SOLN  COMPARISON:  09/19/2013  FINDINGS: Sagittal images of the spine are unremarkable. Atherosclerotic calcification and plaques are noted abdominal aorta and iliac arteries. No aortic aneurysm. There is hepatic fatty infiltration. The patient is status postcholecystectomy. Small pneumobilia probable postcholecystectomy in left hepatic lobe. The pancreas, spleen and adrenal glands are unremarkable. Kidneys are symmetrical in size and enhancement. No hydronephrosis or hydroureter. There is nonobstructive calcified calculus in midpole of the right kidney measures 3 mm. No calcified ureteral calculi are noted.  There is no pericecal inflammation. There is nonspecific mild thickening of the right colonic wall with fatty appearance. This is best visualized in coronal image 44. Nonspecific mild colitis or inflammatory bowel disease cannot be excluded. Clinical correlation is necessary.  No small bowel or colonic obstruction  No distal colonic obstruction. The patient is status post hysterectomy. No pelvic ascites or adenopathy. No destructive bony  lesions are noted within pelvis.  Delayed renal images shows bilateral renal symmetrical excretion.  IMPRESSION: 1. Fatty infiltration of the liver. Status post cholecystectomy. Mild pneumobilia in left hepatic lobe. 2. No hydronephrosis or hydroureter. 3. Nonspecific mild fatty thickening of right colonic wall. This is best visualized in coronal image 44. Nonspecific mild colitis cannot be excluded. Clinical correlation is necessary. 4. No pericecal inflammation. No small bowel or colonic obstruction. 5. Status post hysterectomy.   Electronically Signed   By: LiLahoma Crocker.D.   On: 06/19/2014 17:08    ROS Blood pressure 110/68, pulse 91, temperature 98.2 F (36.8 C), temperature source Oral, resp. rate 20, height 5' 3"  (1.6 m), weight 192 lb 11.2 oz (87.408 kg), SpO2 91.00%. Physical Exam Alert. Skin is dry. She has noticeable skin peeling. Skin is very dry. This is new x 6 month. HR regular. Lungs clear. Abdomen soft, obese. BS+. Tenderness epigastric region.  Stool brown, loose and guaiac negative.  Assessment/Plan:Anemia ?Etiology.  Chronic abdominal pain. Last colonoscopy ?2003. Will get records and discuss with Dr. ReRosalin Hawking/01/2014, 8:20 AM     GI attending note; Patient interviewed and examined. Lab studies and abdominopelvic CT films reviewed. Patient denies melena rectal bleeding hematuria vaginal bleeding or hematemesis. While she is at chronic epigastric pain of 25 years anorexia nausea and vomiting have been going on for 6 months. Serum B12 level is low; iron studies and folate levels are pending. Family history is negative for  celiac disease. Abdominal exam reveals full abdomen with normal bowel sounds and no bruit. Abdomen is soft mild midepigastric tenderness but no organomegaly or masses.  Assessment; #1. Chronic epigastric pain requiring high dose narcotic therapy for control. She has had this pain for 25 years and has undergone extensive evaluation both  in [place] and at [facility]. Presently she is going to pain clinic at [facility]. If pain control is not satisfactory should consider celiac ganglion block. #2. Macrocytic anemia. Serum B12 level is low. Other studies are pending. Studies do not suggest hemolysis. #3. Nausea and vomiting. She possibly has gastroparesis but need to rule out peptic ulcer disease.  Recommendations; B12 therapy per Dr. Ree Kida. Will consider EGD at some point.

## 2014-06-20 NOTE — Progress Notes (Signed)
Nutrition Brief Note  Patient identified on the Malnutrition Screening Tool (MST) Report  Wt Readings from Last 15 Encounters:  06/19/14 192 lb 11.2 oz (87.408 kg)  05/01/14 200 lb (90.719 kg)  04/08/14 200 lb (90.719 kg)  09/19/13 200 lb (90.719 kg)  09/18/13 200 lb (90.719 kg)  05/03/13 207 lb (93.895 kg)  03/28/13 198 lb (89.812 kg)  09/23/12 195 lb (88.451 kg)    Body mass index is 34.14 kg/(m^2). Patient meets criteria for obesity class I based on current BMI. Weight loss of 7#, 3.5% in about 45 days is not significant.  Current diet order is clear liqids, patient is consuming approximately n/a% of meals at this time. Labs and medications reviewed.   No nutrition interventions warranted at this time. If nutrition issues arise, please consult RD.    Colman Cater MS,RD,CSG,LDN Office: [phone] Pager: [phone]

## 2014-06-20 NOTE — Progress Notes (Addendum)
[facility]                                                                              Patient Demographics  Deborah Russo, is a 53 y.o. female, DOB - 1961-11-21, LJQ:[id number]  Admit date - 06/19/2014   Admitting Physician Allyne Gee, MD  Outpatient Primary MD for the patient is No PCP Per Patient  LOS - 1   Chief Complaint  Patient presents with  . Weakness  . Abdominal Pain  . Nausea      HPI: Deborah Russo is a 53 y.o. female with history of chronic pain syndrome presented to the ED with abdominal pain. She stated that pain is a little worse than it has been. She states it is rather located diffusely. She has not had any diarrhea but she admited to having vomiting and nausea. Patient states that she has noted the pain to be more in the upper belly and was a constant discomfort. She denied having any chest pain noted. In the ED she was noted also to have a low hemoglobin when compared to her results from May. On questioning, she denied taking any NSAIDs for her pain. In addition, a CT scan showed presence of nonspecific colitis and a fatty liver., She does smoke and denies having any ETOH use.   Assessment & Plan   Abdominal pain with elevated bilirubin  -CT scan of the abdomen showed nonspecific colitis and fatty liver -Patient currently afebrile no leukocytosis -Continue clear liquid diet and advance as tolerated -Will continue ciprofloxacin -GI Consulted and appreciated -Elevated bilirubin likely related to fatty liver   Macrocytic Anemia -Hemoglobin 6.2 -Pending anemia panel and a FOBT -Patient denies any blood per rectum, any recent NSAID use -2 units packed red blood cells ordered to be transfused  Chronic pain syndrome -Continue oxycodone, home dose  Hypothyroidism -Continue Synthroid  Hyperglycemia  -no known history of diabetes -Pending HbA1c  Code Status: Full  Family Communication: None at bedside  Disposition Plan: Admitted.    Time Spent in minutes   30 minutes  Procedures  None  Consults   Gastroenterology  DVT Prophylaxis  Heparin  Lab Results  Component Value Date   PLT 81* 06/20/2014    Medications  Scheduled Meds: . antiseptic oral rinse  15 mL Mouth Rinse BID  . ciprofloxacin  500 mg Oral BID  . diazepam  5 mg Oral TID  . docusate sodium  100 mg Oral BID  . folic acid  1 mg Oral Daily  . heparin  5,000 Units Subcutaneous 3 times per day  . levothyroxine  175 mcg Oral QAC breakfast  . multivitamin with minerals  1 tablet Oral Daily  . OxyCODONE  80 mg Oral Q12H  . thiamine  100 mg Oral Daily   Continuous Infusions: . sodium chloride 75 mL/hr at 06/19/14 2102   PRN Meds:.acetaminophen, acetaminophen, alum & mag hydroxide-simeth, diphenhydrAMINE, morphine injection, ondansetron (ZOFRAN) IV, ondansetron, senna-docusate, sorbitol, SUMAtriptan, zolpidem  Antibiotics    Anti-infectives   Start     Dose/Rate Route Frequency Ordered Stop   06/19/14 2100  ciprofloxacin (CIPRO) tablet 500 mg     500 mg Oral  2 times daily 06/19/14 2007          Subjective:   [name] seen and examined today.  Patient continues to complain of diffuse abdominal pain.  She complains of nausea on a daily basis and is unable to tolerate food.  She cannot recall if she has lost any weight.  She denies dark or bloody stools.  She does state that she had a colonoscopy in 2003.  She denies chest pain or shortness of breath.   Objective:   Filed Vitals:   06/20/14 0915 06/20/14 0945 06/20/14 1000 06/20/14 1215  BP: 122/73 108/66 114/72 111/70  Pulse: 91 91 90 85  Temp: 97.9 F (36.6 C) 98.1 F (36.7 C) 98.5 F (36.9 C) 98.1 F (36.7 C)  TempSrc: Oral Oral Oral Oral  Resp: 18 18 20 18   Height:      Weight:      SpO2: 98%   96%    Wt Readings from Last 3 Encounters:  06/19/14 87.408 kg (192 lb 11.2 oz)  05/01/14 90.719 kg (200 lb)  04/08/14 90.719 kg (200 lb)     Intake/Output Summary  (Last 24 hours) at 06/20/14 1241 Last data filed at 06/20/14 1000  Gross per 24 hour  Intake 1347.5 ml  Output      0 ml  Net 1347.5 ml    Exam  General: Well developed, well nourished, NAD, appears stated age  HEENT: NCAT, PERRLA, EOMI, Anicteic Sclera, mucous membranes moist.   Neck: Supple, no JVD, no masses  Cardiovascular: S1 S2 auscultated, no rubs, murmurs or gallops. Regular rate and rhythm.  Respiratory: Clear to auscultation bilaterally with equal chest rise  Abdomen: Soft, diffusely tender, nondistended, + bowel sounds  Extremities: warm dry without cyanosis clubbing or edema  Neuro: AAOx3, cranial nerves grossly intact. No focal deficits  Skin: Rash on lower extremities.   Psych: Depressed mood and affect.   Data Review   Micro Results No results found for this or any previous visit (from the past 240 hour(s)).  Radiology Reports Dg Chest 2 View  06/19/2014   CLINICAL DATA:  Weakness with nausea and vomiting.  EXAM: CHEST  2 VIEW  COMPARISON:  04/08/2014.  FINDINGS: Lungs are hyperexpanded. No edema or focal airspace consolidation. The cardio pericardial silhouette is enlarged. Telemetry leads overlie the chest. Imaged bony structures of the thorax are intact.  IMPRESSION: Stable.  No acute cardiopulmonary findings.   Electronically Signed   By: Misty Stanley M.D.   On: 06/19/2014 17:14   Ct Abdomen Pelvis W Contrast  06/19/2014   CLINICAL DATA:  Weakness, abdominal pain, vomiting and diarrhea for 7 months, status post hysterectomy and cholecystectomy.  EXAM: CT ABDOMEN AND PELVIS WITH CONTRAST  TECHNIQUE: Multidetector CT imaging of the abdomen and pelvis was performed using the standard protocol following bolus administration of intravenous contrast.  CONTRAST:  184m OMNIPAQUE IOHEXOL 300 MG/ML SOLN, 529mOMNIPAQUE IOHEXOL 300 MG/ML SOLN  COMPARISON:  09/19/2013  FINDINGS: Sagittal images of the spine are unremarkable. Atherosclerotic calcification and plaques are  noted abdominal aorta and iliac arteries. No aortic aneurysm. There is hepatic fatty infiltration. The patient is status postcholecystectomy. Small pneumobilia probable postcholecystectomy in left hepatic lobe. The pancreas, spleen and adrenal glands are unremarkable. Kidneys are symmetrical in size and enhancement. No hydronephrosis or hydroureter. There is nonobstructive calcified calculus in midpole of the right kidney measures 3 mm. No calcified ureteral calculi are noted.  There is no pericecal inflammation. There is nonspecific  mild thickening of the right colonic wall with fatty appearance. This is best visualized in coronal image 44. Nonspecific mild colitis or inflammatory bowel disease cannot be excluded. Clinical correlation is necessary.  No small bowel or colonic obstruction  No distal colonic obstruction. The patient is status post hysterectomy. No pelvic ascites or adenopathy. No destructive bony lesions are noted within pelvis.  Delayed renal images shows bilateral renal symmetrical excretion.  IMPRESSION: 1. Fatty infiltration of the liver. Status post cholecystectomy. Mild pneumobilia in left hepatic lobe. 2. No hydronephrosis or hydroureter. 3. Nonspecific mild fatty thickening of right colonic wall. This is best visualized in coronal image 44. Nonspecific mild colitis cannot be excluded. Clinical correlation is necessary. 4. No pericecal inflammation. No small bowel or colonic obstruction. 5. Status post hysterectomy.   Electronically Signed   By: Lahoma Crocker M.D.   On: 06/19/2014 17:08    CBC  Recent Labs Lab 06/19/14 1524 06/20/14 0527  WBC 7.0 5.3  HGB 7.6* 6.2*  HCT 21.8* 18.5*  PLT 105* 81*  MCV 113.0* 113.5*  MCH 39.4* 38.0*  MCHC 34.9 33.5  RDW 14.0 14.1  LYMPHSABS 1.9  --   MONOABS 0.2  --   EOSABS 0.0  --   BASOSABS 0.0  --     Chemistries   Recent Labs Lab 06/19/14 1524 06/20/14 0527  NA 134* 138  K 3.4* 3.4*  CL 91* 100  CO2 30 29  GLUCOSE 182* 137*   BUN 15 13  CREATININE 0.66 0.67  CALCIUM 8.5 8.0*  AST 13 9  ALT 9 7  ALKPHOS 159* 128*  BILITOT 1.3* 1.1   ------------------------------------------------------------------------------------------------------------------ estimated creatinine clearance is 84.3 ml/min (by C-G formula based on Cr of 0.67). ------------------------------------------------------------------------------------------------------------------ No results found for this basename: HGBA1C,  in the last 72 hours ------------------------------------------------------------------------------------------------------------------ No results found for this basename: CHOL, HDL, LDLCALC, TRIG, CHOLHDL, LDLDIRECT,  in the last 72 hours ------------------------------------------------------------------------------------------------------------------ No results found for this basename: TSH, T4TOTAL, FREET3, T3FREE, THYROIDAB,  in the last 72 hours ------------------------------------------------------------------------------------------------------------------ No results found for this basename: VITAMINB12, FOLATE, FERRITIN, TIBC, IRON, RETICCTPCT,  in the last 72 hours  Coagulation profile No results found for this basename: INR, PROTIME,  in the last 168 hours  No results found for this basename: DDIMER,  in the last 72 hours  Cardiac Enzymes  Recent Labs Lab 06/19/14 1524  TROPONINI <0.30   ------------------------------------------------------------------------------------------------------------------ No components found with this basename: POCBNP,     Quaneisha Hanisch D.O. on 06/20/2014 at 12:41 PM  Between 7am to 7pm - Pager - [phone]  After 7pm go to [email or web address] - password [email or web address]  And look for the night coverage person covering for me after hours  [facility] Office  [phone]

## 2014-06-21 DIAGNOSIS — I951 Orthostatic hypotension: Secondary | ICD-10-CM | POA: Diagnosis not present

## 2014-06-21 DIAGNOSIS — D518 Other vitamin B12 deficiency anemias: Secondary | ICD-10-CM

## 2014-06-21 DIAGNOSIS — R112 Nausea with vomiting, unspecified: Secondary | ICD-10-CM | POA: Diagnosis not present

## 2014-06-21 DIAGNOSIS — R1013 Epigastric pain: Secondary | ICD-10-CM | POA: Diagnosis not present

## 2014-06-21 DIAGNOSIS — D649 Anemia, unspecified: Secondary | ICD-10-CM | POA: Diagnosis not present

## 2014-06-21 DIAGNOSIS — G8929 Other chronic pain: Secondary | ICD-10-CM | POA: Diagnosis not present

## 2014-06-21 DIAGNOSIS — R109 Unspecified abdominal pain: Secondary | ICD-10-CM | POA: Diagnosis not present

## 2014-06-21 LAB — BASIC METABOLIC PANEL
Anion gap: 8 (ref 5–15)
BUN: 11 mg/dL (ref 6–23)
CHLORIDE: 102 meq/L (ref 96–112)
CO2: 27 mEq/L (ref 19–32)
CREATININE: 0.7 mg/dL (ref 0.50–1.10)
Calcium: 7.9 mg/dL — ABNORMAL LOW (ref 8.4–10.5)
GFR calc Af Amer: >90 mL/min (ref >90)
GFR calc non Af Amer: >90 mL/min (ref >90)
GLUCOSE: 139 mg/dL — AB (ref 70–99)
Potassium: 3.4 mEq/L — ABNORMAL LOW (ref 3.7–5.3)
Sodium: 137 mEq/L (ref 137–147)

## 2014-06-21 LAB — TYPE AND SCREEN
ABO/RH(D): B POS
ANTIBODY SCREEN: NEGATIVE
UNIT DIVISION: 0
Unit division: 0

## 2014-06-21 LAB — CLOSTRIDIUM DIFFICILE BY PCR: Toxigenic C. Difficile by PCR: POSITIVE — AB

## 2014-06-21 LAB — CBC
HEMATOCRIT: 22.2 % — AB (ref 36.0–46.0)
HEMOGLOBIN: 7.7 g/dL — AB (ref 12.0–15.0)
MCH: 35.8 pg — ABNORMAL HIGH (ref 26.0–34.0)
MCHC: 34.7 g/dL (ref 30.0–36.0)
MCV: 103.3 fL — AB (ref 78.0–100.0)
Platelets: 58 10*3/uL — ABNORMAL LOW (ref 150–400)
RBC: 2.15 MIL/uL — AB (ref 3.87–5.11)
RDW: 21.5 % — ABNORMAL HIGH (ref 11.5–15.5)
WBC: 5.1 10*3/uL (ref 4.0–10.5)

## 2014-06-21 LAB — GLUCOSE, CAPILLARY: Glucose-Capillary: 107 mg/dL — ABNORMAL HIGH (ref 70–99)

## 2014-06-21 LAB — FOLATE RBC: RBC Folate: 758 ng/mL — ABNORMAL HIGH (ref >280)

## 2014-06-21 MED ORDER — METRONIDAZOLE 500 MG PO TABS
500.0000 mg | ORAL_TABLET | Freq: Three times a day (TID) | ORAL | Status: DC
  Administered 2014-06-21 – 2014-06-22 (×2): 500 mg via ORAL
  Filled 2014-06-21 (×2): qty 1

## 2014-06-21 MED ORDER — LOPERAMIDE HCL 2 MG PO CAPS
2.0000 mg | ORAL_CAPSULE | ORAL | Status: DC | PRN
  Administered 2014-06-21 (×2): 2 mg via ORAL
  Filled 2014-06-21 (×3): qty 1

## 2014-06-21 MED ORDER — POTASSIUM CHLORIDE CRYS ER 20 MEQ PO TBCR
40.0000 meq | EXTENDED_RELEASE_TABLET | Freq: Once | ORAL | Status: AC
  Administered 2014-06-21: 40 meq via ORAL
  Filled 2014-06-21: qty 2

## 2014-06-21 NOTE — Progress Notes (Signed)
06/21/14 1203 Patient's daughter and sisters arrived this morning, anxious regarding plan of care. Daughter requested update from MD. Pt stated okay for daughter to receive update. Text-paged Dr. Ree Kida to notify. Patient and daughter stated they spoke with MD and concerns regarding plan of care were addressed. Donavan Foil, RN

## 2014-06-21 NOTE — Progress Notes (Signed)
Subjective; Patient feels better. She states she woke up thinking about eating eggs this morning. She had no difficulty with full liquids. Diet has been advanced to soft diet by Dr. Ree Kida. She continues to complain of pain in epigastric region. She has not had a bowel movement in 2 days. She believes bowels will move after she eats real food. There is no history of diarrhea prior to this admission. Patient recalls that she had procedure on her bile ducts several years ago at [facility] by Dr. Kristie Cowman. There is no history of elevated transaminases or family history of liver disease.  Objective; BP 118/53  Pulse 85  Temp(Src) 97.4 F (36.3 C) (Oral)  Resp 20  Ht 5' 3"  (1.6 m)  Wt 192 lb 11.2 oz (87.408 kg)  BMI 34.14 kg/m2  SpO2 99% Patient is alert and in no acute distress. She appears pale. Abdomen is protuberant. Bowel sounds are normal. On palpation it is soft with mild to moderate midepigastric tenderness. No organomegaly or masses. No LE edema.  Lab data; WBC 5.1, H&H 7.7 and 22.2 and MCV 103.3. Platelet count 58K. Serum sodium 137, potassium 3.4, right 102, CO2 27, BUN 11, creatinine 0.70 Serum calcium 7.9. Serum iron 247 and UIBC less than 15. Red cell folate is pending.    Assessment; #1. Macrocytic anemia. Serum B12 level is low and she received first dose of B12 injection last evening. She has received 2 units of PRBCs and her hemoglobin is up from 6.2 to 7.7. #2. Epigastric pain. She has chronic pain and is maintained on pain medication for years. She goes to pain clinic at [facility]. However she's been having more pain and she also has nausea and vomiting. Therefore upper GI tract will be evaluated by endoscopy. #3. Pneumobilia. It appears she had ERCP sphincterotomy several years ago at [facility]. There is nothing to suggest that she has cholangitis. Transaminases on admission were normal. #4. Thrombocytopenia. Patient's platelet count was 152K on  09/18/2013. Thrombocytopenia may be secondary to B12 deficiency but also raises possibility of chronic liver disease. She also has low serum albumin which could be nutritional or clue to underlying liver disease. However she does not have obvious changes of cirrhosis on CT. #5. Focal abnormality involving ascending colon on CT raising possibility of colitis. Other than pain patient does not have any symptoms suggest colitis. She is on empiric therapy with Cipro. Patient's last colonoscopy was several years ago. She desires not to have colonoscopy at the time of EGD.  Recommendations; Will also check serum ferritin on admission blood. INR with a.m. Lab. Reticulocyte count in a.m. Will ask the pathologist to look at smear from prior to transfusion. EGD under propofol next week. My office will contact patient if she is discharged over the weekend.

## 2014-06-21 NOTE — Progress Notes (Addendum)
[facility]                                                                              Patient Demographics  Deborah Russo, is a 53 y.o. female, DOB - March 16, 1961, [id number]  Admit date - 06/19/2014   Admitting Physician Allyne Gee, MD  Outpatient Primary MD for the patient is No PCP Per Patient  LOS - 2   Chief Complaint  Patient presents with  . Weakness  . Abdominal Pain  . Nausea      HPI: Deborah Russo is a 53 y.o. female with history of chronic pain syndrome presented to the ED with abdominal pain. She stated that pain is a little worse than it has been. She states it is rather located diffusely. She has not had any diarrhea but she admited to having vomiting and nausea. Patient states that she has noted the pain to be more in the upper belly and was a constant discomfort. She denied having any chest pain noted. In the ED she was noted also to have a low hemoglobin when compared to her results from May. On questioning, she denied taking any NSAIDs for her pain. In addition, a CT scan showed presence of nonspecific colitis and a fatty liver., She does smoke and denies having any ETOH use.   Assessment & Plan   Abdominal pain with elevated bilirubin  -CT scan of the abdomen showed nonspecific colitis and fatty liver -Patient currently afebrile no leukocytosis -Continue clear liquid diet and advance as tolerated -Will continue ciprofloxacin -GI Consulted and appreciated -Elevated bilirubin likely related to fatty liver   Macrocytic Anemia -on 7/2, Hemoglobin 6.2 and patient was given 2uPRBCs -Hb today 7.7 -Anemia panel: iron 247, Vit B12 147 -Ferritin and folate pending -Will replace B12 -GI consulted and appreciated, and recommended outpatient EGD  Chronic pain syndrome -Continue oxycodone, home dose  Hypothyroidism -Continue Synthroid  Hyperglycemia  -no known history of diabetes -HbA1c 5.9  Hypokalemia -K 3.4, will replace and continue  to monitor -likely secondary to GI losses  Thrombocytopenia -Appears to be chronic, however worsening -will discontinue heparin and place patient on SCDs  Code Status: Full  Family Communication: None at bedside  Disposition Plan: Admitted.   Time Spent in minutes   25 minutes  Procedures  None  Consults   Gastroenterology  DVT Prophylaxis  SCDs  Lab Results  Component Value Date   PLT 58* 06/21/2014    Medications  Scheduled Meds: . antiseptic oral rinse  15 mL Mouth Rinse BID  . ciprofloxacin  500 mg Oral BID  . diazepam  5 mg Oral TID  . docusate sodium  100 mg Oral BID  . folic acid  1 mg Oral Daily  . heparin  5,000 Units Subcutaneous 3 times per day  . levothyroxine  175 mcg Oral QAC breakfast  . multivitamin with minerals  1 tablet Oral Daily  . nicotine  21 mg Transdermal Daily  . OxyCODONE  80 mg Oral Q12H  . thiamine  100 mg Oral Daily   Continuous Infusions: . sodium chloride 75 mL/hr at 06/20/14 1950   PRN Meds:.acetaminophen, acetaminophen, alum & mag hydroxide-simeth, diphenhydrAMINE,  morphine injection, ondansetron (ZOFRAN) IV, ondansetron, senna-docusate, sorbitol, SUMAtriptan, zolpidem  Antibiotics    Anti-infectives   Start     Dose/Rate Route Frequency Ordered Stop   06/19/14 2100  ciprofloxacin (CIPRO) tablet 500 mg     500 mg Oral 2 times daily 06/19/14 2007          Subjective:   [name] seen and examined today.  Patient states she feels better, but still weak.  She wishes to eat food because she is hungry and has not felt that way in weeks. Denies chest pain or shortness of breath.   Objective:   Filed Vitals:   06/20/14 1245 06/20/14 1458 06/20/14 2010 06/21/14 0454  BP: 125/76 128/80 129/82 118/53  Pulse: 87 80 85 85  Temp: 98 F (36.7 C) 97.8 F (36.6 C) 98.2 F (36.8 C) 97.4 F (36.3 C)  TempSrc: Oral Oral Oral Oral  Resp: 18 18 20 20   Height:      Weight:      SpO2: 97% 99% 99% 99%    Wt Readings from  Last 3 Encounters:  06/19/14 87.408 kg (192 lb 11.2 oz)  05/01/14 90.719 kg (200 lb)  04/08/14 90.719 kg (200 lb)     Intake/Output Summary (Last 24 hours) at 06/21/14 0850 Last data filed at 06/21/14 0737  Gross per 24 hour  Intake   3270 ml  Output   1750 ml  Net   1520 ml    Exam  General: Well developed, well nourished, NAD, appears stated age  HEENT: NCAT, mucous membranes moist.   Neck: Supple, no JVD, no masses  Cardiovascular: S1 S2 auscultated, no rubs, murmurs or gallops. Regular rate and rhythm.  Respiratory: Clear to auscultation bilaterally with equal chest rise  Abdomen: Soft, diffusely tender, nondistended, + bowel sounds  Extremities: warm dry without cyanosis clubbing or edema  Neuro: AAOx3, No focal deficits  Skin: Rash on lower extremities.   Psych: Depressed mood and affect.   Data Review   Micro Results Recent Results (from the past 240 hour(s))  URINE CULTURE     Status: None   Collection Time    06/19/14  3:10 PM      Result Value Ref Range Status   Specimen Description URINE, CLEAN CATCH   Final   Special Requests NONE   Final   Culture  Setup Time     Final   Value: 06/20/2014 00:37     Performed at [place]     Final   Value: 50,000 COLONIES/ML     Performed at [facility]   Culture     Final   Value: Multiple bacterial morphotypes present, none predominant. Suggest appropriate recollection if clinically indicated.     Performed at [facility]   Report Status 06/20/2014 FINAL   Final    Radiology Reports Dg Chest 2 View  06/19/2014   CLINICAL DATA:  Weakness with nausea and vomiting.  EXAM: CHEST  2 VIEW  COMPARISON:  04/08/2014.  FINDINGS: Lungs are hyperexpanded. No edema or focal airspace consolidation. The cardio pericardial silhouette is enlarged. Telemetry leads overlie the chest. Imaged bony structures of the thorax are intact.  IMPRESSION: Stable.  No acute cardiopulmonary  findings.   Electronically Signed   By: Misty Stanley M.D.   On: 06/19/2014 17:14   Ct Abdomen Pelvis W Contrast  06/19/2014   CLINICAL DATA:  Weakness, abdominal pain, vomiting and diarrhea for 7 months,  status post hysterectomy and cholecystectomy.  EXAM: CT ABDOMEN AND PELVIS WITH CONTRAST  TECHNIQUE: Multidetector CT imaging of the abdomen and pelvis was performed using the standard protocol following bolus administration of intravenous contrast.  CONTRAST:  124m OMNIPAQUE IOHEXOL 300 MG/ML SOLN, 583mOMNIPAQUE IOHEXOL 300 MG/ML SOLN  COMPARISON:  09/19/2013  FINDINGS: Sagittal images of the spine are unremarkable. Atherosclerotic calcification and plaques are noted abdominal aorta and iliac arteries. No aortic aneurysm. There is hepatic fatty infiltration. The patient is status postcholecystectomy. Small pneumobilia probable postcholecystectomy in left hepatic lobe. The pancreas, spleen and adrenal glands are unremarkable. Kidneys are symmetrical in size and enhancement. No hydronephrosis or hydroureter. There is nonobstructive calcified calculus in midpole of the right kidney measures 3 mm. No calcified ureteral calculi are noted.  There is no pericecal inflammation. There is nonspecific mild thickening of the right colonic wall with fatty appearance. This is best visualized in coronal image 44. Nonspecific mild colitis or inflammatory bowel disease cannot be excluded. Clinical correlation is necessary.  No small bowel or colonic obstruction  No distal colonic obstruction. The patient is status post hysterectomy. No pelvic ascites or adenopathy. No destructive bony lesions are noted within pelvis.  Delayed renal images shows bilateral renal symmetrical excretion.  IMPRESSION: 1. Fatty infiltration of the liver. Status post cholecystectomy. Mild pneumobilia in left hepatic lobe. 2. No hydronephrosis or hydroureter. 3. Nonspecific mild fatty thickening of right colonic wall. This is best visualized in coronal  image 44. Nonspecific mild colitis cannot be excluded. Clinical correlation is necessary. 4. No pericecal inflammation. No small bowel or colonic obstruction. 5. Status post hysterectomy.   Electronically Signed   By: LiLahoma Crocker.D.   On: 06/19/2014 17:08    CBC  Recent Labs Lab 06/19/14 1524 06/20/14 0527 06/21/14 0739  WBC 7.0 5.3 5.1  HGB 7.6* 6.2* 7.7*  HCT 21.8* 18.5* 22.2*  PLT 105* 81* 58*  MCV 113.0* 113.5* 103.3*  MCH 39.4* 38.0* 35.8*  MCHC 34.9 33.5 34.7  RDW 14.0 14.1 21.5*  LYMPHSABS 1.9  --   --   MONOABS 0.2  --   --   EOSABS 0.0  --   --   BASOSABS 0.0  --   --     Chemistries   Recent Labs Lab 06/19/14 1524 06/20/14 0527 06/21/14 0739  NA 134* 138 137  K 3.4* 3.4* 3.4*  CL 91* 100 102  CO2 30 29 27   GLUCOSE 182* 137* 139*  BUN 15 13 11   CREATININE 0.66 0.67 0.70  CALCIUM 8.5 8.0* 7.9*  AST 13 9  --   ALT 9 7  --   ALKPHOS 159* 128*  --   BILITOT 1.3* 1.1  --    ------------------------------------------------------------------------------------------------------------------ estimated creatinine clearance is 84.3 ml/min (by C-G formula based on Cr of 0.7). ------------------------------------------------------------------------------------------------------------------  Recent Labs  06/19/14 2007  HGBA1C 5.9*   ------------------------------------------------------------------------------------------------------------------ No results found for this basename: CHOL, HDL, LDLCALC, TRIG, CHOLHDL, LDLDIRECT,  in the last 72 hours ------------------------------------------------------------------------------------------------------------------  Recent Labs  06/20/14 0527  TSH 0.608   ------------------------------------------------------------------------------------------------------------------  Recent Labs  06/19/14 2007 06/20/14 0904  VITAMINB12  --  147*  TIBC NOT CALC  --   IRON 247*  --     Coagulation profile No results  found for this basename: INR, PROTIME,  in the last 168 hours  No results found for this basename: DDIMER,  in the last 72 hours  Cardiac Enzymes  Recent Labs Lab 06/19/14 1524  TROPONINI <0.30   ------------------------------------------------------------------------------------------------------------------ No components found with this basename: POCBNP,     Moo Gravley D.O. on 06/21/2014 at 8:50 AM  Between 7am to 7pm - Pager - [phone]  After 7pm go to [email or web address] - password [email or web address]  And look for the night coverage person covering for me after hours  [facility] Office  [phone]

## 2014-06-21 NOTE — Progress Notes (Signed)
06/21/14 1644 Fall prevention/safety plan discussed with patient frequently throughout day. bedalarm on for safety. Call light and phone kept within reach. Instructed to call for nursing staff assistance and wait for staff arrival, not attempt getting up on her own. Verbalized understanding. Stated "sometimes I just have to get up quick". Instructed to call for safety. Yellow nonslip socks in place. Donavan Foil, RN

## 2014-06-21 NOTE — Progress Notes (Signed)
06/21/14 1930 Patient c/o "diarrhea, need my Immodium" this evening. Received dose of Immodium this afternoon at 1547, see MAR, for one loose stool. Pt had two more small, loose stools this afternoon per nurse tech report. c-diff PCR obtained per nursing protocol. Enteric precautions ordered. Pt requested immodium this evening, stated "I have to take it to eat. my pain is the same whether I eat or not". Discussed to eat soft food slowly as tolerated, offered soup if more agreeable. Stated "no, I want food". immodium given at 1845 as ordered PRN loose stool. c-diff PCR positive per lab report this evening at 1915. Text-paged Dr. Darrick Meigs to notify.

## 2014-06-21 NOTE — Progress Notes (Signed)
06/21/14 1640 Patient reported having "diarrhea" after lunch. Stated "I knew this wound happen, haven't eaten much for two weeks". Stated one loose stool. States pain "about the same as before I ate". Pt reported no further stools this afternoon, but requested "Immodium for my diarrhea". Notified Dr. Ree Kida. Order placed for Immodium PRN loose stools. Given as ordered. Instructed patient to notify nursing staff of any further stools. Pt stated understood. Nursing to monitor. Donavan Foil, RN

## 2014-06-22 DIAGNOSIS — A0472 Enterocolitis due to Clostridium difficile, not specified as recurrent: Principal | ICD-10-CM

## 2014-06-22 DIAGNOSIS — D649 Anemia, unspecified: Secondary | ICD-10-CM | POA: Diagnosis not present

## 2014-06-22 DIAGNOSIS — D696 Thrombocytopenia, unspecified: Secondary | ICD-10-CM

## 2014-06-22 DIAGNOSIS — R109 Unspecified abdominal pain: Secondary | ICD-10-CM | POA: Diagnosis not present

## 2014-06-22 DIAGNOSIS — I951 Orthostatic hypotension: Secondary | ICD-10-CM | POA: Diagnosis not present

## 2014-06-22 DIAGNOSIS — G8929 Other chronic pain: Secondary | ICD-10-CM | POA: Diagnosis not present

## 2014-06-22 LAB — FERRITIN: FERRITIN: 127 ng/mL (ref 10–291)

## 2014-06-22 LAB — GLUCOSE, CAPILLARY: Glucose-Capillary: 108 mg/dL — ABNORMAL HIGH (ref 70–99)

## 2014-06-22 LAB — BASIC METABOLIC PANEL
ANION GAP: 10 (ref 5–15)
BUN: 8 mg/dL (ref 6–23)
CALCIUM: 8.4 mg/dL (ref 8.4–10.5)
CHLORIDE: 106 meq/L (ref 96–112)
CO2: 24 mEq/L (ref 19–32)
Creatinine, Ser: 0.68 mg/dL (ref 0.50–1.10)
GFR calc non Af Amer: >90 mL/min (ref >90)
Glucose, Bld: 118 mg/dL — ABNORMAL HIGH (ref 70–99)
Potassium: 3.3 mEq/L — ABNORMAL LOW (ref 3.7–5.3)
Sodium: 140 mEq/L (ref 137–147)

## 2014-06-22 LAB — PROTIME-INR
INR: 1.18 (ref 0.00–1.49)
Prothrombin Time: 15 seconds (ref 11.6–15.2)

## 2014-06-22 LAB — RETICULOCYTES
RBC.: 2.22 MIL/uL — ABNORMAL LOW (ref 3.87–5.11)
Retic Count, Absolute: 84.4 10*3/uL (ref 19.0–186.0)
Retic Ct Pct: 3.8 % — ABNORMAL HIGH (ref 0.4–3.1)

## 2014-06-22 LAB — CBC
HCT: 23.2 % — ABNORMAL LOW (ref 36.0–46.0)
Hemoglobin: 7.9 g/dL — ABNORMAL LOW (ref 12.0–15.0)
MCH: 35.6 pg — AB (ref 26.0–34.0)
MCHC: 34.1 g/dL (ref 30.0–36.0)
MCV: 104.5 fL — ABNORMAL HIGH (ref 78.0–100.0)
PLATELETS: 56 10*3/uL — AB (ref 150–400)
RBC: 2.22 MIL/uL — AB (ref 3.87–5.11)
RDW: 20.7 % — AB (ref 11.5–15.5)
WBC: 4.8 10*3/uL (ref 4.0–10.5)

## 2014-06-22 MED ORDER — ALUM & MAG HYDROXIDE-SIMETH 200-200-20 MG/5ML PO SUSP: 30.0000 mL | Freq: Four times a day (QID) | ORAL | Status: DC | PRN

## 2014-06-22 MED ORDER — TRAMADOL HCL 50 MG PO TABS: 50.0000 mg | ORAL_TABLET | Freq: Four times a day (QID) | ORAL | Status: DC | PRN

## 2014-06-22 MED ORDER — NICOTINE 21 MG/24HR TD PT24: 21.0000 mg | MEDICATED_PATCH | Freq: Every day | TRANSDERMAL | Status: DC

## 2014-06-22 MED ORDER — CYANOCOBALAMIN 500 MCG PO TABS: 500.0000 ug | ORAL_TABLET | Freq: Every day | ORAL | Status: DC

## 2014-06-22 MED ORDER — POTASSIUM CHLORIDE CRYS ER 20 MEQ PO TBCR
40.0000 meq | EXTENDED_RELEASE_TABLET | Freq: Once | ORAL | Status: AC
  Administered 2014-06-22: 40 meq via ORAL
  Filled 2014-06-22: qty 2

## 2014-06-22 MED ORDER — METRONIDAZOLE 500 MG PO TABS: 500.0000 mg | ORAL_TABLET | Freq: Three times a day (TID) | ORAL | Status: DC

## 2014-06-22 MED ORDER — SACCHAROMYCES BOULARDII 250 MG PO CAPS: 250.0000 mg | ORAL_CAPSULE | Freq: Two times a day (BID) | ORAL | Status: DC

## 2014-06-22 MED ORDER — ONDANSETRON HCL 4 MG PO TABS: 4.0000 mg | ORAL_TABLET | Freq: Four times a day (QID) | ORAL | Status: DC | PRN

## 2014-06-22 NOTE — Discharge Instructions (Signed)
Clostridium Difficile Infection Clostridium difficile (C. difficile) is a bacteria found in the intestinal tract or colon. Under certain conditions, it causes diarrhea and sometimes severe disease. The severe form of the disease is known as pseudomembranous colitis (often called C. difficile colitis). This disease can damage the lining of the colon or cause the colon to become enlarged (toxic megacolon).  CAUSES  Your colon normally contains many different bacteria, including C. difficile. The balance of bacteria in your colon can change during illness. This is especially true when you take antibiotic medicine. Taking antibiotics may allow the C. difficile to grow, multiply excessively, and make a toxin that then causes illness. The elderly and people with certain medical conditions have a greater risk of getting C. difficile infections. SYMPTOMS   Watery diarrhea.  Fever.  Fatigue.  Loss of appetite.  Nausea.  Abdominal swelling, pain, or tenderness.  Dehydration. DIAGNOSIS  Your symptoms may make your caregiver suspicious of a C. difficile infection, especially if you have used antibiotics in the preceding weeks. However, there are only 2 ways to know for certain whether you have a C. difficile infection:  A lab test that finds the toxin in your stool.  The specific appearance of an abnormality (pseudomembrane) in your colon. This can only be seen by doing a sigmoidoscopy or colonoscopy. These procedures involve passing an instrument through your rectum to look at the inside of your colon. Your caregiver will help determine if these tests are necessary. TREATMENT   Most people are successfully treated with one of two specific antibiotics, usually given by mouth. Other antibiotics you are receiving are stopped if possible.  Intravenous (IV) fluids and correction of electrolyte imbalance may be necessary.  Rarely, surgery may be needed to remove the infected part of the  intestines.  Careful hand washing by you and your caregivers is important to prevent the spread of infection. In the hospital, your caregivers may also put on gowns and gloves to prevent the spread of the C. difficile bacteria. Your room is also cleaned regularly with a solution containing bleach or a product that is known to kill C. difficile. HOME CARE INSTRUCTIONS  Drink enough fluids to keep your urine clear or pale yellow. Avoid milk, caffeine, and alcohol.  Ask your caregiver for specific rehydration instructions.  Try eating small, frequent meals rather than large meals.  Take your antibiotics as directed. Finish them even if you start to feel better.  Do not use medicines to slow diarrhea. This could delay healing or cause complications.  Wash your hands thoroughly after using the bathroom and before preparing food.  Make sure people who live with you wash their hands often, too.  Carefully disinfect all surfaces with a product that contains chlorine bleach. SEEK MEDICAL CARE IF:  Diarrhea persists longer than expected or recurs after completing your course of antibiotic treatment for the C. difficile infection.  You have trouble staying hydrated. SEEK IMMEDIATE MEDICAL CARE IF:  You develop a new fever.  You have increasing abdominal pain or tenderness.  There is blood in your stools, or your stools are dark black and tarry.  You cannot hold down food or liquids. MAKE SURE YOU:   Understand these instructions.  Will watch your condition.  Will get help right away if you are not doing well or get worse. Document Released: 09/15/2005 Document Revised: 04/02/2013 Document Reviewed: 05/14/2011 [facility] Patient Information 2015 [place], [place]. This information is not intended to replace advice given to you by  your health care provider. Make sure you discuss any questions you have with your health care provider.

## 2014-06-22 NOTE — Progress Notes (Signed)
Patient discharged with instructions, prescription, and care notes.  Verbalized understanding via teach back.  IV was removed and the site was WNL. Patient voiced no further complaints or concerns at the time of discharge.  Appointments scheduled per instructions.  Patient left the floor via w/c with staff and family in stable condition.Patient discharged with instructions, prescription, and care notes.  Verbalized understanding via teach back.  IV was removed and the site was WNL. Patient voiced no further complaints or concerns at the time of discharge.  Appointments scheduled per instructions.  Patient left the floor via w/c with staff and family in stable condition.

## 2014-06-22 NOTE — Discharge Summary (Addendum)
Physician Discharge Summary  Deborah Russo HGD:[id number] DOB: May 19, 1961 DOA: 06/19/2014  PCP: No PCP Per Patient  Admit date: 06/19/2014 Discharge date: 06/22/2014  Time spent: 45 minutes  Recommendations for Outpatient Follow-up:  Patient will be discharged home. She is to continue her medications as prescribed. Patient will need followup with Dr. Legrand Rams at the scheduled time. Patient will be contacted by Dr. Olevia Perches office to schedule an EGD. Patient to continue a soft bland diet. This was discussed with the patient and she understands.  Discharge Diagnoses:  Abdominal pain secondary to C. difficile colitis Macrocytic anemia Chronic pain syndrome Hypothyroidism Hyperglycemia Hypokalemia Thrombocytopenia  Discharge Condition: Stable  Diet recommendation: Soft/bland diet  Filed Weights   06/19/14 2011  Weight: 87.408 kg (192 lb 11.2 oz)    History of present illness:  Deborah Russo is a 53 y.o. female with history of chronic pain syndrome presented to the ED with abdominal pain. She stated that pain is a little worse than it has been. She states it is rather located diffusely. She has not had any diarrhea but she admited to having vomiting and nausea. Patient states that she has noted the pain to be more in the upper belly and was a constant discomfort. She denied having any chest pain noted. In the ED she was noted also to have a low hemoglobin when compared to her results from May. On questioning, she denied taking any NSAIDs for her pain. In addition, a CT scan showed presence of nonspecific colitis and a fatty liver., She does smoke and denies having any ETOH use.  Hospital Course:  Abdominal pain secondary to Cdiff colitis -CT scan of the abdomen showed nonspecific colitis and fatty liver  -Patient currently afebrile no leukocytosis  -Continue clear liquid diet and advance as tolerated  -GI Consulted and appreciated and patient will be scheduled for outpatient EGD  next week -Elevated bilirubin likely related to fatty liver  -Cdiff PCR positive -Patient started on flagyl, and cipro was discontinued  Macrocytic Anemia  -on 7/2, Hemoglobin 6.2 and patient was given 2uPRBCs  -Hb today 7.9 -Anemia panel: iron 247, Vit B12 147, Ferritin 127, folate 758  -B12 replaced -GI consulted and appreciated, and recommended outpatient EGD   Chronic pain syndrome  -Continue oxycodone, home dose   Hypothyroidism  -Continue Synthroid   Hyperglycemia  -no known history of diabetes  -HbA1c 5.9   Hypokalemia  -Replaced  -likely secondary to GI losses   Thrombocytopenia  -Appears to be chronic  Procedures: None  Consultations: Gastroenterology, [name]  Discharge Exam: Filed Vitals:   06/22/14 0624  BP: 126/83  Pulse: 72  Temp: 98 F (36.7 C)  Resp: 20   Exam  General: Well developed, well nourished, NAD, appears stated age  HEENT: NCAT, mucous membranes moist.  Neck: Supple, no JVD, no masses  Cardiovascular: S1 S2 auscultated, no rubs, murmurs or gallops. Regular rate and rhythm.  Respiratory: Clear to auscultation bilaterally with equal chest rise  Abdomen: Soft, diffusely tender, nondistended, + bowel sounds  Extremities: warm dry without cyanosis clubbing or edema  Neuro: AAOx3, No focal deficits  Skin: Rash on lower extremities.  Psych: Depressed mood and affect, intact judgement  Discharge Instructions      Discharge Instructions   Discharge instructions    Complete by:  As directed   Patient will be discharged home. She is to continue her medications as prescribed. Patient will need followup with Dr. Legrand Rams at the scheduled time. Patient will  be contacted by Dr. Olevia Perches office to schedule an EGD. Patient to continue a soft bland diet. This was discussed with the patient and she understands.            Medication List         alum & mag hydroxide-simeth 200-200-20 MG/5ML suspension  Commonly known as:  MAALOX/MYLANTA    Take 30 mLs by mouth every 6 (six) hours as needed for indigestion or heartburn (dyspepsia).     cyanocobalamin 500 MCG tablet  Take 1 tablet (500 mcg total) by mouth daily.     diazepam 5 MG tablet  Commonly known as:  VALIUM  Take 5 mg by mouth 3 (three) times daily.     diphenhydrAMINE 25 mg capsule  Commonly known as:  BENADRYL  Take 25 mg by mouth at bedtime as needed for allergies.     levothyroxine 175 MCG tablet  Commonly known as:  SYNTHROID, LEVOTHROID  Take 1 tablet (175 mcg total) by mouth daily before breakfast.     metroNIDAZOLE 500 MG tablet  Commonly known as:  FLAGYL  Take 1 tablet (500 mg total) by mouth every 8 (eight) hours.     nicotine 21 mg/24hr patch  Commonly known as:  NICODERM CQ - dosed in mg/24 hours  Place 1 patch (21 mg total) onto the skin daily.     ondansetron 4 MG tablet  Commonly known as:  ZOFRAN  Take 1 tablet (4 mg total) by mouth every 6 (six) hours as needed for nausea.     OxyCODONE 80 mg T12a 12 hr tablet  Commonly known as:  OXYCONTIN  Take 80 mg by mouth every 12 (twelve) hours.     saccharomyces boulardii 250 MG capsule  Commonly known as:  FLORASTOR  Take 1 capsule (250 mg total) by mouth 2 (two) times daily.     SUMAtriptan 100 MG tablet  Commonly known as:  IMITREX  Take 100 mg by mouth every 2 (two) hours as needed for migraine.     traMADol 50 MG tablet  Commonly known as:  ULTRAM  Take 1 tablet (50 mg total) by mouth every 6 (six) hours as needed.       Allergies  Allergen Reactions  . Penicillins Hives  . Sulfa Antibiotics Hives   Follow-up Information   Follow up with [facility], MD On 07/19/2014. (at 9:30)    Specialty:  Internal Medicine   Contact information:   [address] [place] [address] [phone]       Follow up with No PCP Per Patient.   Specialty:  General Practice      Follow up with Rogene Houston, MD. (Office will contact you for EGD)    Specialty:   Gastroenterology   Contact information:   [address] [place] [address] [phone]        The results of significant diagnostics from this hospitalization (including imaging, microbiology, ancillary and laboratory) are listed below for reference.    Significant Diagnostic Studies: Dg Chest 2 View  06/19/2014   CLINICAL DATA:  Weakness with nausea and vomiting.  EXAM: CHEST  2 VIEW  COMPARISON:  04/08/2014.  FINDINGS: Lungs are hyperexpanded. No edema or focal airspace consolidation. The cardio pericardial silhouette is enlarged. Telemetry leads overlie the chest. Imaged bony structures of the thorax are intact.  IMPRESSION: Stable.  No acute cardiopulmonary findings.   Electronically Signed   By: Misty Stanley M.D.   On: 06/19/2014  17:14   Ct Abdomen Pelvis W Contrast  06/19/2014   CLINICAL DATA:  Weakness, abdominal pain, vomiting and diarrhea for 7 months, status post hysterectomy and cholecystectomy.  EXAM: CT ABDOMEN AND PELVIS WITH CONTRAST  TECHNIQUE: Multidetector CT imaging of the abdomen and pelvis was performed using the standard protocol following bolus administration of intravenous contrast.  CONTRAST:  128m OMNIPAQUE IOHEXOL 300 MG/ML SOLN, 568mOMNIPAQUE IOHEXOL 300 MG/ML SOLN  COMPARISON:  09/19/2013  FINDINGS: Sagittal images of the spine are unremarkable. Atherosclerotic calcification and plaques are noted abdominal aorta and iliac arteries. No aortic aneurysm. There is hepatic fatty infiltration. The patient is status postcholecystectomy. Small pneumobilia probable postcholecystectomy in left hepatic lobe. The pancreas, spleen and adrenal glands are unremarkable. Kidneys are symmetrical in size and enhancement. No hydronephrosis or hydroureter. There is nonobstructive calcified calculus in midpole of the right kidney measures 3 mm. No calcified ureteral calculi are noted.  There is no pericecal inflammation. There is nonspecific mild thickening of the right colonic  wall with fatty appearance. This is best visualized in coronal image 44. Nonspecific mild colitis or inflammatory bowel disease cannot be excluded. Clinical correlation is necessary.  No small bowel or colonic obstruction  No distal colonic obstruction. The patient is status post hysterectomy. No pelvic ascites or adenopathy. No destructive bony lesions are noted within pelvis.  Delayed renal images shows bilateral renal symmetrical excretion.  IMPRESSION: 1. Fatty infiltration of the liver. Status post cholecystectomy. Mild pneumobilia in left hepatic lobe. 2. No hydronephrosis or hydroureter. 3. Nonspecific mild fatty thickening of right colonic wall. This is best visualized in coronal image 44. Nonspecific mild colitis cannot be excluded. Clinical correlation is necessary. 4. No pericecal inflammation. No small bowel or colonic obstruction. 5. Status post hysterectomy.   Electronically Signed   By: LiLahoma Crocker.D.   On: 06/19/2014 17:08    Microbiology: Recent Results (from the past 240 hour(s))  URINE CULTURE     Status: None   Collection Time    06/19/14  3:10 PM      Result Value Ref Range Status   Specimen Description URINE, CLEAN CATCH   Final   Special Requests NONE   Final   Culture  Setup Time     Final   Value: 06/20/2014 00:37     Performed at [facility]   Final   Value: 50,000 COLONIES/ML     Performed at [facility] Culture     Final   Value: Multiple bacterial morphotypes present, none predominant. Suggest appropriate recollection if clinically indicated.     Performed at [facility] Report Status 06/20/2014 FINAL   Final  CLOSTRIDIUM DIFFICILE BY PCR     Status: Abnormal   Collection Time    06/21/14  5:16 PM      Result Value Ref Range Status   C difficile by pcr POSITIVE (*) NEGATIVE Final   Comment: CRITICAL RESULT CALLED TO, READ BACK BY AND VERIFIED WITH:     A.ROGERS AT 1913 ON 06/21/14 BY S.VANHOORNE     Labs: Basic  Metabolic Panel:  Recent Labs Lab 06/19/14 1524 06/20/14 0527 06/21/14 0739 06/22/14 0635  NA 134* 138 137 140  K 3.4* 3.4* 3.4* 3.3*  CL 91* 100 102 106  CO2 30 29 27 24   GLUCOSE 182* 137* 139* 118*  BUN 15 13 11 8   CREATININE 0.66 0.67 0.70 0.68  CALCIUM 8.5 8.0*  7.9* 8.4   Liver Function Tests:  Recent Labs Lab 06/19/14 1524 06/20/14 0527  AST 13 9  ALT 9 7  ALKPHOS 159* 128*  BILITOT 1.3* 1.1  PROT 6.7 5.9*  ALBUMIN 3.0* 2.7*    Recent Labs Lab 06/19/14 1524  LIPASE 50   No results found for this basename: AMMONIA,  in the last 168 hours CBC:  Recent Labs Lab 06/19/14 1524 06/20/14 0527 06/21/14 0739 06/22/14 0635  WBC 7.0 5.3 5.1 4.8  NEUTROABS 4.9  --   --   --   HGB 7.6* 6.2* 7.7* 7.9*  HCT 21.8* 18.5* 22.2* 23.2*  MCV 113.0* 113.5* 103.3* 104.5*  PLT 105* 81* 58* 56*   Cardiac Enzymes:  Recent Labs Lab 06/19/14 1524  TROPONINI <0.30   BNP: BNP (last 3 results)  Recent Labs  04/08/14 0955  PROBNP 106.1   CBG:  Recent Labs Lab 06/21/14 0740 06/22/14 0738  GLUCAP 107* 108*       Signed:  Rhilee Currin  [facility] 06/22/2014, 12:01 PM

## 2014-06-25 ENCOUNTER — Other Ambulatory Visit (INDEPENDENT_AMBULATORY_CARE_PROVIDER_SITE_OTHER): Payer: Self-pay | Admitting: *Deleted

## 2014-06-25 DIAGNOSIS — R112 Nausea with vomiting, unspecified: Secondary | ICD-10-CM

## 2014-06-25 DIAGNOSIS — R1013 Epigastric pain: Secondary | ICD-10-CM

## 2014-06-25 LAB — PATHOLOGIST SMEAR REVIEW

## 2014-06-26 ENCOUNTER — Encounter (HOSPITAL_COMMUNITY): Payer: Self-pay | Admitting: Pharmacy Technician

## 2014-06-26 NOTE — Patient Instructions (Signed)
Deborah Russo  06/26/2014   Your procedure is scheduled on:  07/01/2014  Report to [facility] at  77  AM.  Call this number if you have problems the morning of surgery: [phone]   Remember:   Do not eat food or drink liquids after midnight.   Take these medicines the morning of surgery with A SIP OF WATER:  Valium, levothyroxine, zofran, oxycodone, ultram   Do not wear jewelry, make-up or nail polish.  Do not wear lotions, powders, or perfumes.   Do not shave 48 hours prior to surgery. Men may shave face and neck.  Do not bring valuables to the hospital.  [facility] is not responsible for any belongings or valuables.               Contacts, dentures or bridgework may not be worn into surgery.  Leave suitcase in the car. After surgery it may be brought to your room.  For patients admitted to the hospital, discharge time is determined by your treatment team.               Patients discharged the day of surgery will not be allowed to drive home.  Name and phone number of your driver: family  Special Instructions: N/A   Please read over the following fact sheets that you were given: Pain Booklet, Coughing and Deep Breathing, Surgical Site Infection Prevention, Anesthesia Post-op Instructions and Care and Recovery After Surgery Esophagogastroduodenoscopy Esophagogastroduodenoscopy (EGD) is a procedure to examine the lining of the esophagus, stomach, and first part of the small intestine (duodenum). A long, flexible, lighted tube with a camera attached (endoscope) is inserted down the throat to view these organs. This procedure is done to detect problems or abnormalities, such as inflammation, bleeding, ulcers, or growths, in order to treat them. The procedure lasts about 5-20 minutes. It is usually an outpatient procedure, but it may need to be performed in emergency cases in the hospital. LET YOUR CAREGIVER KNOW ABOUT:   Allergies to food or medicine.  All medicines you  are taking, including vitamins, herbs, eyedrops, and over-the-counter medicines and creams.  Use of steroids (by mouth or creams).  Previous problems you or members of your family have had with the use of anesthetics.  Any blood disorders you have.  Previous surgeries you have had.  Other health problems you have.  Possibility of pregnancy, if this applies. RISKS AND COMPLICATIONS  Generally, EGD is a safe procedure. However, as with any procedure, complications can occur. Possible complications include:  Infection.  Bleeding.  Tearing (perforation) of the esophagus, stomach, or duodenum.  Difficulty breathing or not being able to breath.  Excessive sweating.  Spasms of the larynx.  Slowed heartbeat.  Low blood pressure. BEFORE THE PROCEDURE  Do not eat or drink anything for 6-8 hours before the procedure or as directed by your caregiver.  Ask your caregiver about changing or stopping your regular medicines.  If you wear dentures, be prepared to remove them before the procedure.  Arrange for someone to drive you home after the procedure. PROCEDURE   A vein will be accessed to give medicines and fluids. A medicine to relax you (sedative) and a pain reliever will be given through that access into the vein.  A numbing medicine (local anesthetic) may be sprayed on your throat for comfort and to stop you from gagging or coughing.  A mouth guard may be placed in your mouth to protect your  teeth and to keep you from biting on the endoscope.  You will be asked to lie on your left side.  The endoscope is inserted down your throat and into the esophagus, stomach, and duodenum.  Air is put through the endoscope to allow your caregiver to view the lining of your esophagus clearly.  The esophagus, stomach, and duodenum is then examined. During the exam, your caregiver may:  Remove tissue to be examined under a microscope (biopsy) for inflammation, infection, or other  medical problems.  Remove growths.  Remove objects (foreign bodies) that are stuck.  Treat any bleeding with medicines or other devices that stop tissues from bleeding (hot cauters, clipping devices).  Widen (dilate) or stretch narrowed areas of the esophagus and stomach.  The endoscope will then be withdrawn. AFTER THE PROCEDURE  You will be taken to a recovery area to be monitored. You will be able to go home once you are stable and alert.  Do not eat or drink anything until the local anesthetic and numbing medicines have worn off. You may choke.  It is normal to feel bloated, have pain with swallowing, or have a sore throat for a short time. This will wear off.  Your caregiver should be able to discuss his or her findings with you. It will take longer to discuss the test results if any biopsies were taken. Document Released: 04/08/2005 Document Revised: 11/22/2012 Document Reviewed: 11/08/2012 [facility] Patient Information 2015 [place], [place]. This information is not intended to replace advice given to you by your health care provider. Make sure you discuss any questions you have with your health care provider. PATIENT INSTRUCTIONS POST-ANESTHESIA  IMMEDIATELY FOLLOWING SURGERY:  Do not drive or operate machinery for the first twenty four hours after surgery.  Do not make any important decisions for twenty four hours after surgery or while taking narcotic pain medications or sedatives.  If you develop intractable nausea and vomiting or a severe headache please notify your doctor immediately.  FOLLOW-UP:  Please make an appointment with your surgeon as instructed. You do not need to follow up with anesthesia unless specifically instructed to do so.  WOUND CARE INSTRUCTIONS (if applicable):  Keep a dry clean dressing on the anesthesia/puncture wound site if there is drainage.  Once the wound has quit draining you may leave it open to air.  Generally you should leave the bandage intact  for twenty four hours unless there is drainage.  If the epidural site drains for more than 36-48 hours please call the anesthesia department.  QUESTIONS?:  Please feel free to call your physician or the hospital operator if you have any questions, and they will be happy to assist you.

## 2014-06-27 ENCOUNTER — Encounter (HOSPITAL_COMMUNITY)
Admission: RE | Admit: 2014-06-27 | Discharge: 2014-06-27 | Disposition: A | Discharge status: Home / Self Care | Payer: Medicare Other | Source: Ambulatory Visit | Attending: Internal Medicine | Admitting: Internal Medicine

## 2014-06-27 ENCOUNTER — Encounter (HOSPITAL_COMMUNITY): Payer: Self-pay

## 2014-06-27 VITALS — BP 125/73 | HR 75 | Temp 97.2°F | Resp 18 | Ht 63.0 in | Wt 193.0 lb

## 2014-06-27 DIAGNOSIS — R112 Nausea with vomiting, unspecified: Secondary | ICD-10-CM

## 2014-06-27 DIAGNOSIS — R1013 Epigastric pain: Secondary | ICD-10-CM

## 2014-06-27 DIAGNOSIS — Z01812 Encounter for preprocedural laboratory examination: Secondary | ICD-10-CM | POA: Insufficient documentation

## 2014-06-27 HISTORY — DX: Hypothyroidism, unspecified: E03.9

## 2014-06-27 HISTORY — DX: Sleep apnea, unspecified: G47.30

## 2014-06-27 LAB — BASIC METABOLIC PANEL
Anion gap: 12 (ref 5–15)
BUN: 4 mg/dL — AB (ref 6–23)
CO2: 27 mEq/L (ref 19–32)
CREATININE: 0.65 mg/dL (ref 0.50–1.10)
Calcium: 8.4 mg/dL (ref 8.4–10.5)
Chloride: 99 mEq/L (ref 96–112)
GFR calc non Af Amer: >90 mL/min (ref >90)
Glucose, Bld: 131 mg/dL — ABNORMAL HIGH (ref 70–99)
POTASSIUM: 3.2 meq/L — AB (ref 3.7–5.3)
Sodium: 138 mEq/L (ref 137–147)

## 2014-06-27 LAB — HEMOGLOBIN AND HEMATOCRIT, BLOOD
HCT: 28.6 % — ABNORMAL LOW (ref 36.0–46.0)
HEMOGLOBIN: 9.1 g/dL — AB (ref 12.0–15.0)

## 2014-06-28 NOTE — Progress Notes (Signed)
Pt's pre-op lab work for EGD with propofol with [name] on 07/01/14 showed hemoglobin level of 9.1, hematocrit level of 28.6, potassium level of 3.2. Dr. Patsey Berthold notified. No orders given. Ok to proceed with procedure per Dr. Patsey Berthold.

## 2014-07-01 ENCOUNTER — Emergency Department (HOSPITAL_COMMUNITY): Payer: Medicare Other

## 2014-07-01 ENCOUNTER — Encounter (HOSPITAL_COMMUNITY): Payer: Self-pay | Admitting: Emergency Medicine

## 2014-07-01 ENCOUNTER — Ambulatory Visit (HOSPITAL_COMMUNITY)
Admission: RE | Admit: 2014-07-01 | Discharge: 2014-07-01 | Disposition: A | Discharge status: Other Acute Inpatient Hospital | Payer: Medicare Other | Source: Ambulatory Visit | Attending: Internal Medicine | Admitting: Internal Medicine

## 2014-07-01 ENCOUNTER — Emergency Department (HOSPITAL_COMMUNITY)
Admission: EM | Admit: 2014-07-01 | Discharge: 2014-07-01 | Disposition: A | Discharge status: Home / Self Care | Payer: Medicare Other | Attending: Emergency Medicine | Admitting: Emergency Medicine

## 2014-07-01 ENCOUNTER — Encounter (HOSPITAL_COMMUNITY): Payer: Medicare Other | Admitting: Anesthesiology

## 2014-07-01 ENCOUNTER — Encounter (HOSPITAL_COMMUNITY): Admission: RE | Payer: Self-pay | Source: Ambulatory Visit | Attending: Internal Medicine

## 2014-07-01 ENCOUNTER — Ambulatory Visit (HOSPITAL_COMMUNITY): Payer: Medicare Other | Admitting: Anesthesiology

## 2014-07-01 DIAGNOSIS — R112 Nausea with vomiting, unspecified: Secondary | ICD-10-CM | POA: Insufficient documentation

## 2014-07-01 DIAGNOSIS — R7309 Other abnormal glucose: Secondary | ICD-10-CM | POA: Insufficient documentation

## 2014-07-01 DIAGNOSIS — R197 Diarrhea, unspecified: Secondary | ICD-10-CM | POA: Insufficient documentation

## 2014-07-01 DIAGNOSIS — E039 Hypothyroidism, unspecified: Secondary | ICD-10-CM | POA: Diagnosis not present

## 2014-07-01 DIAGNOSIS — Z9981 Dependence on supplemental oxygen: Secondary | ICD-10-CM | POA: Diagnosis not present

## 2014-07-01 DIAGNOSIS — R1013 Epigastric pain: Secondary | ICD-10-CM | POA: Insufficient documentation

## 2014-07-01 DIAGNOSIS — F17200 Nicotine dependence, unspecified, uncomplicated: Secondary | ICD-10-CM | POA: Diagnosis not present

## 2014-07-01 DIAGNOSIS — G8929 Other chronic pain: Secondary | ICD-10-CM | POA: Insufficient documentation

## 2014-07-01 DIAGNOSIS — Z01812 Encounter for preprocedural laboratory examination: Secondary | ICD-10-CM | POA: Diagnosis not present

## 2014-07-01 DIAGNOSIS — R609 Edema, unspecified: Secondary | ICD-10-CM | POA: Diagnosis not present

## 2014-07-01 DIAGNOSIS — G43909 Migraine, unspecified, not intractable, without status migrainosus: Secondary | ICD-10-CM | POA: Diagnosis not present

## 2014-07-01 DIAGNOSIS — G4730 Sleep apnea, unspecified: Secondary | ICD-10-CM | POA: Insufficient documentation

## 2014-07-01 DIAGNOSIS — Z792 Long term (current) use of antibiotics: Secondary | ICD-10-CM | POA: Diagnosis not present

## 2014-07-01 DIAGNOSIS — Z880 Allergy status to penicillin: Secondary | ICD-10-CM | POA: Diagnosis not present

## 2014-07-01 DIAGNOSIS — Z5309 Procedure and treatment not carried out because of other contraindication: Secondary | ICD-10-CM | POA: Diagnosis not present

## 2014-07-01 DIAGNOSIS — M79609 Pain in unspecified limb: Secondary | ICD-10-CM | POA: Insufficient documentation

## 2014-07-01 DIAGNOSIS — Z79899 Other long term (current) drug therapy: Secondary | ICD-10-CM | POA: Diagnosis not present

## 2014-07-01 DIAGNOSIS — L0233 Carbuncle of buttock: Secondary | ICD-10-CM | POA: Diagnosis not present

## 2014-07-01 DIAGNOSIS — M7989 Other specified soft tissue disorders: Secondary | ICD-10-CM

## 2014-07-01 DIAGNOSIS — M79604 Pain in right leg: Secondary | ICD-10-CM

## 2014-07-01 LAB — CBC WITH DIFFERENTIAL/PLATELET
BASOS PCT: 0 % (ref 0–1)
Basophils Absolute: 0 10*3/uL (ref 0.0–0.1)
EOS ABS: 0.1 10*3/uL (ref 0.0–0.7)
EOS PCT: 2 % (ref 0–5)
HEMATOCRIT: 28 % — AB (ref 36.0–46.0)
HEMOGLOBIN: 8.8 g/dL — AB (ref 12.0–15.0)
LYMPHS ABS: 2.1 10*3/uL (ref 0.7–4.0)
Lymphocytes Relative: 28 % (ref 12–46)
MCH: 32.2 pg (ref 26.0–34.0)
MCHC: 31.4 g/dL (ref 30.0–36.0)
MCV: 102.6 fL — AB (ref 78.0–100.0)
MONO ABS: 0.5 10*3/uL (ref 0.1–1.0)
MONOS PCT: 7 % (ref 3–12)
Neutro Abs: 4.8 10*3/uL (ref 1.7–7.7)
Neutrophils Relative %: 63 % (ref 43–77)
Platelets: 327 10*3/uL (ref 150–400)
RBC: 2.73 MIL/uL — AB (ref 3.87–5.11)
RDW: 19.3 % — ABNORMAL HIGH (ref 11.5–15.5)
WBC: 7.6 10*3/uL (ref 4.0–10.5)

## 2014-07-01 LAB — COMPREHENSIVE METABOLIC PANEL
ALT: 18 U/L (ref 0–35)
ANION GAP: 11 (ref 5–15)
AST: 75 U/L — ABNORMAL HIGH (ref 0–37)
Albumin: 2.9 g/dL — ABNORMAL LOW (ref 3.5–5.2)
Alkaline Phosphatase: 158 U/L — ABNORMAL HIGH (ref 39–117)
BUN: 4 mg/dL — AB (ref 6–23)
CO2: 29 mEq/L (ref 19–32)
CREATININE: 0.63 mg/dL (ref 0.50–1.10)
Calcium: 9 mg/dL (ref 8.4–10.5)
Chloride: 97 mEq/L (ref 96–112)
GFR calc Af Amer: >90 mL/min (ref >90)
GFR calc non Af Amer: >90 mL/min (ref >90)
Glucose, Bld: 149 mg/dL — ABNORMAL HIGH (ref 70–99)
Potassium: 3.3 mEq/L — ABNORMAL LOW (ref 3.7–5.3)
Sodium: 137 mEq/L (ref 137–147)
TOTAL PROTEIN: 7 g/dL (ref 6.0–8.3)
Total Bilirubin: 0.3 mg/dL (ref 0.3–1.2)

## 2014-07-01 LAB — URINALYSIS, ROUTINE W REFLEX MICROSCOPIC
Bilirubin Urine: NEGATIVE
Glucose, UA: NEGATIVE mg/dL
KETONES UR: NEGATIVE mg/dL
Leukocytes, UA: NEGATIVE
NITRITE: NEGATIVE
Protein, ur: NEGATIVE mg/dL
UROBILINOGEN UA: 0.2 mg/dL (ref 0.0–1.0)
pH: 5.5 (ref 5.0–8.0)

## 2014-07-01 LAB — POCT I-STAT 4, (NA,K, GLUC, HGB,HCT)
Glucose, Bld: 164 mg/dL — ABNORMAL HIGH (ref 70–99)
HCT: 28 % — ABNORMAL LOW (ref 36.0–46.0)
Hemoglobin: 9.5 g/dL — ABNORMAL LOW (ref 12.0–15.0)
POTASSIUM: 3.3 meq/L — AB (ref 3.7–5.3)
Sodium: 135 mEq/L — ABNORMAL LOW (ref 137–147)

## 2014-07-01 LAB — PROTIME-INR
INR: 1.36 (ref 0.00–1.49)
PROTHROMBIN TIME: 16.8 s — AB (ref 11.6–15.2)

## 2014-07-01 LAB — URINE MICROSCOPIC-ADD ON

## 2014-07-01 SURGERY — CANCELLED PROCEDURE
Anesthesia: Monitor Anesthesia Care

## 2014-07-01 MED ORDER — SODIUM CHLORIDE 0.9 % IV SOLN
INTRAVENOUS | Status: DC
  Administered 2014-07-01: 08:00:00 via INTRAVENOUS

## 2014-07-01 MED ORDER — LIDOCAINE HCL (PF) 1 % IJ SOLN
INTRAMUSCULAR | Status: AC
  Filled 2014-07-01: qty 5

## 2014-07-01 MED ORDER — DOXYCYCLINE HYCLATE 100 MG PO CAPS: 100.0000 mg | ORAL_CAPSULE | Freq: Two times a day (BID) | ORAL | Status: DC

## 2014-07-01 MED ORDER — PROPOFOL 10 MG/ML IV BOLUS
INTRAVENOUS | Status: AC
  Filled 2014-07-01: qty 20

## 2014-07-01 MED ORDER — LIDOCAINE HCL (PF) 2 % IJ SOLN
INTRAMUSCULAR | Status: AC
  Filled 2014-07-01: qty 10

## 2014-07-01 SURGICAL SUPPLY — 21 items
BLOCK BITE 60FR ADLT L/F BLUE (MISCELLANEOUS) IMPLANT
ELECT REM PT RETURN 9FT ADLT (ELECTROSURGICAL)
ELECTRODE REM PT RTRN 9FT ADLT (ELECTROSURGICAL) IMPLANT
FLOOR PAD 36X40 (MISCELLANEOUS)
FORCEP COLD BIOPSY (CUTTING FORCEPS) IMPLANT
FORCEPS BIOP RAD 4 LRG CAP 4 (CUTTING FORCEPS) IMPLANT
FORMALIN 10 PREFIL 20ML (MISCELLANEOUS) IMPLANT
KIT CLEAN ENDO COMPLIANCE (KITS) IMPLANT
MANIFOLD NEPTUNE II (INSTRUMENTS) ×4 IMPLANT
NEEDLE SCLEROTHERAPY 25GX240 (NEEDLE) IMPLANT
PAD FLOOR 36X40 (MISCELLANEOUS) IMPLANT
PROBE APC STR FIRE (PROBE) IMPLANT
PROBE INJECTION GOLD (MISCELLANEOUS)
PROBE INJECTION GOLD 7FR (MISCELLANEOUS) IMPLANT
SNARE ROTATE MED OVAL 20MM (MISCELLANEOUS) IMPLANT
SNARE SHORT THROW 13M SML OVAL (MISCELLANEOUS) ×3 IMPLANT
SYR 50ML LL SCALE MARK (SYRINGE) IMPLANT
SYR INFLATION 60ML (SYRINGE) IMPLANT
TUBING INSUFFLATOR CO2MPACT (TUBING) ×4 IMPLANT
TUBING IRRIGATION ENDOGATOR (MISCELLANEOUS) IMPLANT
WATER STERILE IRR 1000ML POUR (IV SOLUTION) IMPLANT

## 2014-07-01 NOTE — Discharge Instructions (Signed)
Elevate your feet above your heart, as much as possible. This will help decrease the swelling. Use a warm compress, or soak in a tub, 3 times a day to help heal the sore on your left buttock. Followup with a primary care doctor if not better in 3 or 4 days.    Edema Edema is an abnormal buildup of fluids. It is more common in your legs and thighs. Painless swelling of the feet and ankles is more likely as a person ages. It also is common in looser skin, like around your eyes. HOME CARE   Keep the affected body part above the level of the heart while lying down.  Do not sit still or stand for a long time.  Do not put anything right under your knees when you lie down.  Do not wear tight clothes on your upper legs.  Exercise your legs to help the puffiness (swelling) go down.  Wear elastic bandages or support stockings as told by your doctor.  A low-salt diet may help lessen the puffiness.  Only take medicine as told by your doctor. GET HELP IF:  Treatment is not working.  You have heart, liver, or kidney disease and notice that your skin looks puffy or shiny.  You have puffiness in your legs that does not get better when you raise your legs.  You have sudden weight gain for no reason. GET HELP RIGHT AWAY IF:   You have shortness of breath or chest pain.  You cannot breathe when you lie down.  You have pain, redness, or warmth in the areas that are puffy.  You have heart, liver, or kidney disease and get edema all of a sudden.  You have a fever and your symptoms get worse all of a sudden. MAKE SURE YOU:   Understand these instructions.  Will watch your condition.  Will get help right away if you are not doing well or get worse. Document Released: 05/24/2008 Document Revised: 12/11/2013 Document Reviewed: 09/28/2013 [facility] Patient Information 2015 [place], [place]. This information is not intended to replace advice given to you by your health care provider. Make  sure you discuss any questions you have with your health care provider.  Musculoskeletal Pain Musculoskeletal pain is muscle and boney aches and pains. These pains can occur in any part of the body. Your caregiver may treat you without knowing the cause of the pain. They may treat you if blood or urine tests, X-rays, and other tests were normal.  CAUSES There is often not a definite cause or reason for these pains. These pains may be caused by a type of germ (virus). The discomfort may also come from overuse. Overuse includes working out too hard when your body is not fit. Boney aches also come from weather changes. Bone is sensitive to atmospheric pressure changes. HOME CARE INSTRUCTIONS   Ask when your test results will be ready. Make sure you get your test results.  Only take over-the-counter or prescription medicines for pain, discomfort, or fever as directed by your caregiver. If you were given medications for your condition, do not drive, operate machinery or power tools, or sign legal documents for 24 hours. Do not drink alcohol. Do not take sleeping pills or other medications that may interfere with treatment.  Continue all activities unless the activities cause more pain. When the pain lessens, slowly resume normal activities. Gradually increase the intensity and duration of the activities or exercise.  During periods of severe pain, bed rest  may be helpful. Lay or sit in any position that is comfortable.  Putting ice on the injured area.  Put ice in a bag.  Place a towel between your skin and the bag.  Leave the ice on for 15 to 20 minutes, 3 to 4 times a day.  Follow up with your caregiver for continued problems and no reason can be found for the pain. If the pain becomes worse or does not go away, it may be necessary to repeat tests or do additional testing. Your caregiver may need to look further for a possible cause. SEEK IMMEDIATE MEDICAL CARE IF:  You have pain that is  getting worse and is not relieved by medications.  You develop chest pain that is associated with shortness or breath, sweating, feeling sick to your stomach (nauseous), or throw up (vomit).  Your pain becomes localized to the abdomen.  You develop any new symptoms that seem different or that concern you. MAKE SURE YOU:   Understand these instructions.  Will watch your condition.  Will get help right away if you are not doing well or get worse. Document Released: 12/06/2005 Document Revised: 02/28/2012 Document Reviewed: 08/10/2013 [facility] Patient Information 2015 [place], [place]. This information is not intended to replace advice given to you by your health care provider. Make sure you discuss any questions you have with your health care provider.

## 2014-07-01 NOTE — ED Notes (Signed)
Pt was to have procedure done today. Pt's procedure was cancelled and pt was brought to ED for evaluation of bilateral leg edema and being SOB.

## 2014-07-01 NOTE — Anesthesia Preprocedure Evaluation (Addendum)
Anesthesia Evaluation  Patient identified by MRN, date of birth, ID band Patient awake    Reviewed: Allergy & Precautions, H&P , NPO status , Patient's Chart, lab work & pertinent test results  Airway       Dental   Pulmonary sleep apnea , Current Smoker,          Cardiovascular negative cardio ROS      Neuro/Psych  Headaches, Chronic pain     GI/Hepatic negative GI ROS,   Endo/Other  Hypothyroidism   Renal/GU      Musculoskeletal   Abdominal   Peds  Hematology   Anesthesia Other Findings   Reproductive/Obstetrics                          Anesthesia Physical Anesthesia Plan  ASA: III  Anesthesia Plan: MAC   Post-op Pain Management:    Induction: Intravenous  Airway Management Planned: Simple Face Mask  Additional Equipment:   Intra-op Plan:   Post-operative Plan:   Informed Consent: I have reviewed the patients History and Physical, chart, labs and discussed the procedure including the risks, benefits and alternatives for the proposed anesthesia with the patient or authorized representative who has indicated his/her understanding and acceptance.     Plan Discussed with:   Anesthesia Plan Comments:         Anesthesia Quick Evaluation

## 2014-07-01 NOTE — ED Notes (Signed)
Pt refused to have abscess lanced at this time. States she will have it done later. EDP aware

## 2014-07-01 NOTE — ED Provider Notes (Signed)
CSN: [id number]     Arrival date & time 07/01/14  2458 History  This chart was scribed for Richarda Blade, MD by Elby Beck, ED Scribe. This patient was seen in room APA02/APA02 and the patient's care was started at 8:02 AM.   Chief Complaint  Patient presents with  . Leg Swelling    The history is provided by the patient. No language interpreter was used.    HPI Comments: Deborah Russo is a 53 y.o. female who presents to the Emergency Department complaining of bilateral lower leg swelling (right worse than left) over the past 3 days. She reports that she has had difficulty standing and decreased sensation in her legs over the past 2 days. She states that she went to have an EGD procedure done today, and that she was sent to the ED to be evaluated due to the provider noticing the leg swelling. She states that she was recently hospitalized for abdominal pain and dehydration. She was positive for C. Diff on 06/21/14 and notes that she is still having some episodes of diarrhea. She denies any cardiac history. She denies any associated chest pain or SOB. Relative states that pt has been on pain medications for chronic abdominal pain for the past 24 years, and that the cause of the pain is undetermined.    Past Medical History  Diagnosis Date  . Chronic pain   . Chronic migraine   . Chronic abdominal pain     since approximately 1991  . Nausea, vomiting, and diarrhea     recurrent, chronic  . Pain management   . Hypothyroidism   . Sleep apnea     has CPAP, but doesn't wear it bc she says "i can't sleep with it on"   Past Surgical History  Procedure Laterality Date  . Cesarean section    . Cholecystectomy    . Abdominal hysterectomy    . Abdominal surgery     Family History  Problem Relation Age of Onset  . Asthma Mother   . Cancer Father    History  Substance Use Topics  . Smoking status: Current Every Day Smoker -- 0.50 packs/day for 15 years    Types: Cigarettes  .  Smokeless tobacco: Never Used  . Alcohol Use: No   OB History   Grav Para Term Preterm Abortions TAB SAB Ect Mult Living                 Review of Systems  Respiratory: Negative for shortness of breath.   Cardiovascular: Positive for leg swelling. Negative for chest pain.  Gastrointestinal: Positive for diarrhea.  Musculoskeletal: Positive for gait problem.  All other systems reviewed and are negative.   Allergies  Penicillins and Sulfa antibiotics  Home Medications   Prior to Admission medications   Medication Sig Start Date End Date Taking? Authorizing Provider  alum & mag hydroxide-simeth (MAALOX/MYLANTA) 200-200-20 MG/5ML suspension Take 30 mLs by mouth every 6 (six) hours as needed for indigestion or heartburn (dyspepsia). 06/22/14  Yes Maryann Mikhail, DO  cyanocobalamin 500 MCG tablet Take 1 tablet (500 mcg total) by mouth daily. 06/22/14  Yes Maryann Mikhail, DO  diazepam (VALIUM) 5 MG tablet Take 5 mg by mouth 3 (three) times daily.   Yes Historical Provider, MD  diphenhydrAMINE (BENADRYL) 25 mg capsule Take 25 mg by mouth at bedtime as needed for allergies.   Yes Historical Provider, MD  levothyroxine (SYNTHROID, LEVOTHROID) 175 MCG tablet Take 1 tablet (175 mcg total)  by mouth daily before breakfast. 04/08/14  Yes Maudry Diego, MD  metroNIDAZOLE (FLAGYL) 500 MG tablet Take 1 tablet (500 mg total) by mouth every 8 (eight) hours. 06/22/14  Yes Maryann Mikhail, DO  nicotine (NICODERM CQ - DOSED IN MG/24 HOURS) 21 mg/24hr patch Place 1 patch (21 mg total) onto the skin daily. 06/22/14  Yes Maryann Mikhail, DO  ondansetron (ZOFRAN) 4 MG tablet Take 1 tablet (4 mg total) by mouth every 6 (six) hours as needed for nausea. 06/22/14  Yes Maryann Mikhail, DO  OxyCODONE (OXYCONTIN) 80 mg T12A 12 hr tablet Take 80 mg by mouth every 12 (twelve) hours.   Yes Historical Provider, MD  saccharomyces boulardii (FLORASTOR) 250 MG capsule Take 1 capsule (250 mg total) by mouth 2 (two) times daily.  06/22/14  Yes Maryann Mikhail, DO  SUMAtriptan (IMITREX) 100 MG tablet Take 100 mg by mouth every 2 (two) hours as needed for migraine.   Yes Historical Provider, MD  traMADol (ULTRAM) 50 MG tablet Take 1 tablet (50 mg total) by mouth every 6 (six) hours as needed. 06/22/14  Yes Maryann Mikhail, DO  doxycycline (VIBRAMYCIN) 100 MG capsule Take 1 capsule (100 mg total) by mouth 2 (two) times daily. 07/01/14   Richarda Blade, MD   Triage Vitals: BP 127/66  Pulse 96  Temp(Src) 98.8 F (37.1 C) (Oral)  Resp 18  Ht 5' 3"  (1.6 m)  Wt 190 lb (86.183 kg)  BMI 33.67 kg/m2  SpO2 97%  Physical Exam  Nursing note and vitals reviewed. Constitutional: She is oriented to person, place, and time. She appears well-developed and well-nourished.  HENT:  Head: Normocephalic and atraumatic.  Eyes: Conjunctivae and EOM are normal. Pupils are equal, round, and reactive to light.  Neck: Normal range of motion and phonation normal. Neck supple.  Cardiovascular: Normal rate, regular rhythm and intact distal pulses.   Pulmonary/Chest: Effort normal and breath sounds normal. She exhibits no tenderness.  Abdominal: Soft. She exhibits no distension. There is tenderness (diffuse, mild). There is no guarding.  Genitourinary:  3.5 cm indurated and fluctuant area of the left mid buttocks. Chaperone (Female Therapist, sports) present.   Musculoskeletal: Normal range of motion.  Calf tenderness on the right.  Neurological: She is alert and oriented to person, place, and time. She exhibits normal muscle tone.  Skin: Skin is warm and dry.  Psychiatric: She has a normal mood and affect. Her behavior is normal. Judgment and thought content normal.    ED Course  Procedures (including critical care time)  DIAGNOSTIC STUDIES: Oxygen Saturation is 97% on RA, normal by my interpretation.    COORDINATION OF CARE:  Medications  0.9 %  sodium chloride infusion ( Intravenous Stopped 07/01/14 1205)   Patient Vitals for the past 24 hrs:  BP  Temp Temp src Pulse Resp SpO2 Height Weight  07/01/14 1200 100/63 mmHg - - 88 - 91 % - -  07/01/14 1117 - 97.8 F (36.6 C) Oral - - - - -  07/01/14 1100 - - - 89 - 99 % - -  07/01/14 0745 127/66 mmHg 98.8 F (37.1 C) Oral 96 18 97 % 5' 3"  (1.6 m) 190 lb (86.183 kg)  07/01/14 0744 - - - - - - 5' 3"  (1.6 m) 190 lb (86.183 kg)   8:06 AM- Will order an [place] of pt's right lower leg to observe for a DVT. Will also order IV fluids and diagnostic lab work. Pt advised of plan for treatment and  pt agrees.  12:35 PM- Recheck and discussed lab and radiology findings with pt. Pt states that she is feeling a little better now. She now reports that she has an abscess-like area on her buttocks that she would like to have evaluated. Observed the area with a female chaperone present and discussed plan for incision and drainage.   1:01 PM- Recheck and pt now advises that she does not want to have I&D performed in the ED. Discussed plan for treatment at home with antibiotics and pt will also soak the affected area.   Labs Review Labs Reviewed  CBC WITH DIFFERENTIAL - Abnormal; Notable for the following:    RBC 2.73 (*)    Hemoglobin 8.8 (*)    HCT 28.0 (*)    MCV 102.6 (*)    RDW 19.3 (*)    All other components within normal limits  COMPREHENSIVE METABOLIC PANEL - Abnormal; Notable for the following:    Potassium 3.3 (*)    Glucose, Bld 149 (*)    BUN 4 (*)    Albumin 2.9 (*)    AST 75 (*)    Alkaline Phosphatase 158 (*)    All other components within normal limits  URINALYSIS, ROUTINE W REFLEX MICROSCOPIC - Abnormal; Notable for the following:    Specific Gravity, Urine <1.005 (*)    Hgb urine dipstick TRACE (*)    All other components within normal limits  PROTIME-INR - Abnormal; Notable for the following:    Prothrombin Time 16.8 (*)    All other components within normal limits  URINE MICROSCOPIC-ADD ON - Abnormal; Notable for the following:    Squamous Epithelial / LPF MANY (*)    All other  components within normal limits  URINE CULTURE    Imaging Review US Venous Img Lower Unilateral Right  07/01/2014   CLINICAL DATA:  Right lower extremity pain and edema.  Recent fall.  EXAM: RIGHT LOWER EXTREMITY VENOUS DOPPLER ULTRASOUND  TECHNIQUE: Gray-scale sonography with graded compression, as well as color Doppler and duplex ultrasound were performed to evaluate the lower extremity deep venous systems from the level of the common femoral vein and including the common femoral, femoral, profunda femoral, popliteal and calf veins including the posterior tibial, peroneal and gastrocnemius veins when visible. The superficial great saphenous vein was also interrogated. Spectral Doppler was utilized to evaluate flow at rest and with distal augmentation maneuvers in the common femoral, femoral and popliteal veins.  COMPARISON:  None.  FINDINGS: Common Femoral Vein: No evidence of thrombus. Normal compressibility, respiratory phasicity and response to augmentation.  Saphenofemoral Junction: No evidence of thrombus. Normal compressibility and flow on color Doppler imaging.  Profunda Femoral Vein: No evidence of thrombus. Normal compressibility and flow on color Doppler imaging.  Femoral Vein: No evidence of thrombus. Normal compressibility, respiratory phasicity and response to augmentation.  Popliteal Vein: No evidence of thrombus. Normal compressibility, respiratory phasicity and response to augmentation.  Calf Veins: No evidence of thrombus. Normal compressibility and flow on color Doppler imaging.  Superficial Great Saphenous Vein: No evidence of thrombus. Normal compressibility and flow on color Doppler imaging.  Venous Reflux:  None.  Other Findings: No evidence of superficial thrombophlebitis or abnormal fluid collection.  IMPRESSION: No evidence of right lower extremity deep venous thrombosis.   Electronically Signed   By: Aletta Edouard M.D.   On: 07/01/2014 09:20     EKG  Interpretation   Date/Time:  Monday July 01 2014 07:50:18 EDT Ventricular Rate:  96 PR  Interval:  182 QRS Duration: 89 QT Interval:  417 QTC Calculation: 527 R Axis:   73 Text Interpretation:  Sinus rhythm Probable left atrial enlargement  Abnormal R-wave progression, early transition Borderline T wave  abnormalities Prolonged QT interval since last tracing no significant  change Confirmed by Eulis Foster  MD, Vira Agar ([id number]) on 07/01/2014 8:11:34 AM      MDM   Final diagnoses:  Peripheral edema  Right leg swelling  Left leg swelling  Pain of right lower extremity  Carbuncle, buttock    Nonspecific edema without evidence for congestive heart failure, shortness of breath, ACS, or serious bacterial infection  Nursing Notes Reviewed/ Care Coordinated Applicable Imaging Reviewed Interpretation of Laboratory Data incorporated into ED treatment  The patient appears reasonably screened and/or stabilized for discharge and I doubt any other medical condition or other [facility] requiring further screening, evaluation, or treatment in the ED at this time prior to discharge.  Plan: Home Medications- doxycycline, for carbuncle; Home Treatments- elevate feet above heart, as much as possible, increase protein intake, warm soaks; return here if the recommended treatment, does not improve the symptoms; Recommended follow up- PCP followup, as soon as possible    I personally performed the services described in this documentation, which was scribed in my presence. The recorded information has been reviewed and is accurate.       Richarda Blade, MD 07/01/14 [phone]

## 2014-07-01 NOTE — OR Nursing (Signed)
Patient states she has been weak in her legs since starting potassium on Friday.  Patient also states feet and " legs got big and swollen and are red and painful to touch",  legs are tender to touch .  Legs are not warm today but still swollen. Patient also, states she has a small amount of shortness of breath when standing and attempting to walk.  Spoke with charge nurse about symptoms [name] to be notified.

## 2014-07-01 NOTE — Progress Notes (Signed)
Patient is 53 year old Caucasian female with multiple medical problems was seen in GI consultation week before last while hospitalized for nausea vomiting epigastric pain and anemia. She was found to have B12 deficiency and was begun on parenteral B12. She was also given transfusion. Outpatient EGD was planned. She presents today for a elective EGD with monitored anesthesia care. On exam she is noted to have edema to her right leg and thigh. She continues to have intermittent nausea vomiting and epigastric pain. She also has fallen. Serum potassium last week was 3.2. She is advised to take KCl 20 mg daily for 3 days. She took one dose Friday and not anymore because she thought her swelling was coming from taking potassium chloride. Patient will be referred to emergency room for evaluation and make sure she does not have right DVT. Dr. Sabra Heck was contacted and given brief summary of Is illness.

## 2014-07-01 NOTE — ED Notes (Signed)
Pt was placed on enteric precautions for C-diff last week in hospital, pt states still has diarrhea but not as bad. Pt states pain in both legs and swelling. Lt leg appears slightly more red and swollen on inspection.

## 2014-07-02 LAB — URINE CULTURE
COLONY COUNT: NO GROWTH
Culture: NO GROWTH

## 2014-07-13 ENCOUNTER — Encounter (HOSPITAL_COMMUNITY): Payer: Self-pay | Admitting: Emergency Medicine

## 2014-07-13 ENCOUNTER — Inpatient Hospital Stay (HOSPITAL_COMMUNITY)
Admission: EM | Admit: 2014-07-13 | Discharge: 2014-07-17 | DRG: 373 | Disposition: A | Discharge status: Home / Self Care | Payer: Medicare Other | Attending: Family Medicine | Admitting: Family Medicine

## 2014-07-13 ENCOUNTER — Emergency Department (HOSPITAL_COMMUNITY): Payer: Medicare Other

## 2014-07-13 DIAGNOSIS — A0472 Enterocolitis due to Clostridium difficile, not specified as recurrent: Secondary | ICD-10-CM | POA: Diagnosis present

## 2014-07-13 DIAGNOSIS — K2960 Other gastritis without bleeding: Secondary | ICD-10-CM | POA: Diagnosis present

## 2014-07-13 DIAGNOSIS — R197 Diarrhea, unspecified: Secondary | ICD-10-CM | POA: Diagnosis not present

## 2014-07-13 DIAGNOSIS — Z79899 Other long term (current) drug therapy: Secondary | ICD-10-CM | POA: Diagnosis not present

## 2014-07-13 DIAGNOSIS — E876 Hypokalemia: Secondary | ICD-10-CM | POA: Diagnosis present

## 2014-07-13 DIAGNOSIS — E538 Deficiency of other specified B group vitamins: Secondary | ICD-10-CM | POA: Diagnosis present

## 2014-07-13 DIAGNOSIS — Z9089 Acquired absence of other organs: Secondary | ICD-10-CM | POA: Diagnosis not present

## 2014-07-13 DIAGNOSIS — Z825 Family history of asthma and other chronic lower respiratory diseases: Secondary | ICD-10-CM | POA: Diagnosis not present

## 2014-07-13 DIAGNOSIS — R627 Adult failure to thrive: Secondary | ICD-10-CM | POA: Diagnosis present

## 2014-07-13 DIAGNOSIS — Z8619 Personal history of other infectious and parasitic diseases: Secondary | ICD-10-CM | POA: Diagnosis not present

## 2014-07-13 DIAGNOSIS — E039 Hypothyroidism, unspecified: Secondary | ICD-10-CM | POA: Diagnosis present

## 2014-07-13 DIAGNOSIS — R1013 Epigastric pain: Secondary | ICD-10-CM | POA: Diagnosis not present

## 2014-07-13 DIAGNOSIS — R109 Unspecified abdominal pain: Secondary | ICD-10-CM | POA: Diagnosis not present

## 2014-07-13 DIAGNOSIS — K2970 Gastritis, unspecified, without bleeding: Secondary | ICD-10-CM | POA: Diagnosis not present

## 2014-07-13 DIAGNOSIS — G4733 Obstructive sleep apnea (adult) (pediatric): Secondary | ICD-10-CM | POA: Diagnosis present

## 2014-07-13 DIAGNOSIS — F17200 Nicotine dependence, unspecified, uncomplicated: Secondary | ICD-10-CM | POA: Diagnosis present

## 2014-07-13 DIAGNOSIS — K922 Gastrointestinal hemorrhage, unspecified: Secondary | ICD-10-CM | POA: Diagnosis not present

## 2014-07-13 DIAGNOSIS — K2990 Gastroduodenitis, unspecified, without bleeding: Secondary | ICD-10-CM | POA: Diagnosis not present

## 2014-07-13 DIAGNOSIS — K319 Disease of stomach and duodenum, unspecified: Secondary | ICD-10-CM | POA: Diagnosis present

## 2014-07-13 DIAGNOSIS — K219 Gastro-esophageal reflux disease without esophagitis: Secondary | ICD-10-CM | POA: Diagnosis present

## 2014-07-13 DIAGNOSIS — D539 Nutritional anemia, unspecified: Secondary | ICD-10-CM | POA: Diagnosis present

## 2014-07-13 DIAGNOSIS — D538 Other specified nutritional anemias: Secondary | ICD-10-CM

## 2014-07-13 DIAGNOSIS — K766 Portal hypertension: Secondary | ICD-10-CM | POA: Diagnosis not present

## 2014-07-13 DIAGNOSIS — L02412 Cutaneous abscess of left axilla: Secondary | ICD-10-CM

## 2014-07-13 DIAGNOSIS — G8929 Other chronic pain: Secondary | ICD-10-CM | POA: Diagnosis present

## 2014-07-13 LAB — COMPREHENSIVE METABOLIC PANEL
ALBUMIN: 2.8 g/dL — AB (ref 3.5–5.2)
ALK PHOS: 129 U/L — AB (ref 39–117)
ALT: 5 U/L (ref 0–35)
AST: 14 U/L (ref 0–37)
Anion gap: 10 (ref 5–15)
BUN: 4 mg/dL — ABNORMAL LOW (ref 6–23)
CO2: 30 mEq/L (ref 19–32)
Calcium: 9 mg/dL (ref 8.4–10.5)
Chloride: 98 mEq/L (ref 96–112)
Creatinine, Ser: 0.59 mg/dL (ref 0.50–1.10)
GFR calc Af Amer: >90 mL/min (ref >90)
GFR calc non Af Amer: >90 mL/min (ref >90)
Glucose, Bld: 140 mg/dL — ABNORMAL HIGH (ref 70–99)
POTASSIUM: 3.2 meq/L — AB (ref 3.7–5.3)
Sodium: 138 mEq/L (ref 137–147)
Total Bilirubin: 0.3 mg/dL (ref 0.3–1.2)
Total Protein: 7.1 g/dL (ref 6.0–8.3)

## 2014-07-13 LAB — CBC WITH DIFFERENTIAL/PLATELET
BASOS ABS: 0 10*3/uL (ref 0.0–0.1)
BASOS PCT: 0 % (ref 0–1)
EOS ABS: 0.1 10*3/uL (ref 0.0–0.7)
Eosinophils Relative: 1 % (ref 0–5)
HCT: 38.5 % (ref 36.0–46.0)
Hemoglobin: 12.1 g/dL (ref 12.0–15.0)
Lymphocytes Relative: 24 % (ref 12–46)
Lymphs Abs: 2 10*3/uL (ref 0.7–4.0)
MCH: 31.7 pg (ref 26.0–34.0)
MCHC: 31.4 g/dL (ref 30.0–36.0)
MCV: 100.8 fL — ABNORMAL HIGH (ref 78.0–100.0)
Monocytes Absolute: 0.4 10*3/uL (ref 0.1–1.0)
Monocytes Relative: 5 % (ref 3–12)
Neutro Abs: 6 10*3/uL (ref 1.7–7.7)
Neutrophils Relative %: 70 % (ref 43–77)
Platelets: 281 10*3/uL (ref 150–400)
RBC: 3.82 MIL/uL — ABNORMAL LOW (ref 3.87–5.11)
RDW: 18.5 % — ABNORMAL HIGH (ref 11.5–15.5)
WBC: 8.5 10*3/uL (ref 4.0–10.5)

## 2014-07-13 LAB — URINALYSIS, ROUTINE W REFLEX MICROSCOPIC
Bilirubin Urine: NEGATIVE
Glucose, UA: NEGATIVE mg/dL
HGB URINE DIPSTICK: NEGATIVE
Ketones, ur: NEGATIVE mg/dL
Leukocytes, UA: NEGATIVE
Nitrite: NEGATIVE
Protein, ur: NEGATIVE mg/dL
SPECIFIC GRAVITY, URINE: 1.015 (ref 1.005–1.030)
Urobilinogen, UA: 0.2 mg/dL (ref 0.0–1.0)
pH: 8 (ref 5.0–8.0)

## 2014-07-13 LAB — TYPE AND SCREEN
ABO/RH(D): B POS
ANTIBODY SCREEN: NEGATIVE

## 2014-07-13 LAB — RAPID URINE DRUG SCREEN, HOSP PERFORMED
Amphetamines: NOT DETECTED
BARBITURATES: NOT DETECTED
Benzodiazepines: POSITIVE — AB
COCAINE: NOT DETECTED
Opiates: POSITIVE — AB
TETRAHYDROCANNABINOL: POSITIVE — AB

## 2014-07-13 LAB — LIPASE, BLOOD: Lipase: 22 U/L (ref 11–59)

## 2014-07-13 LAB — LACTIC ACID, PLASMA: Lactic Acid, Venous: 1 mmol/L (ref 0.5–2.2)

## 2014-07-13 MED ORDER — PNEUMOCOCCAL VAC POLYVALENT 25 MCG/0.5ML IJ INJ
0.5000 mL | INJECTION | INTRAMUSCULAR | Status: DC
  Filled 2014-07-13: qty 0.5

## 2014-07-13 MED ORDER — VANCOMYCIN 50 MG/ML ORAL SOLUTION
125.0000 mg | Freq: Four times a day (QID) | ORAL | Status: DC
  Administered 2014-07-13 – 2014-07-17 (×15): 125 mg via ORAL
  Filled 2014-07-13 (×24): qty 2.5

## 2014-07-13 MED ORDER — VANCOMYCIN 50 MG/ML ORAL SOLUTION
ORAL | Status: AC
  Filled 2014-07-13: qty 10

## 2014-07-13 MED ORDER — SACCHAROMYCES BOULARDII 250 MG PO CAPS
250.0000 mg | ORAL_CAPSULE | Freq: Two times a day (BID) | ORAL | Status: DC
  Administered 2014-07-13 – 2014-07-17 (×8): 250 mg via ORAL
  Filled 2014-07-13 (×7): qty 1

## 2014-07-13 MED ORDER — DIAZEPAM 5 MG PO TABS
5.0000 mg | ORAL_TABLET | Freq: Three times a day (TID) | ORAL | Status: DC
  Administered 2014-07-13 – 2014-07-17 (×12): 5 mg via ORAL
  Filled 2014-07-13 (×12): qty 1

## 2014-07-13 MED ORDER — ONDANSETRON HCL 4 MG/2ML IJ SOLN
4.0000 mg | Freq: Once | INTRAMUSCULAR | Status: AC
  Administered 2014-07-13: 4 mg via INTRAVENOUS
  Filled 2014-07-13: qty 2

## 2014-07-13 MED ORDER — MORPHINE SULFATE 4 MG/ML IJ SOLN
4.0000 mg | Freq: Once | INTRAMUSCULAR | Status: AC
  Administered 2014-07-13: 4 mg via INTRAVENOUS
  Filled 2014-07-13: qty 1

## 2014-07-13 MED ORDER — OXYCODONE HCL ER 80 MG PO T12A
80.0000 mg | EXTENDED_RELEASE_TABLET | Freq: Two times a day (BID) | ORAL | Status: DC
  Administered 2014-07-13 – 2014-07-17 (×8): 80 mg via ORAL
  Filled 2014-07-13 (×8): qty 1

## 2014-07-13 MED ORDER — SODIUM CHLORIDE 0.9 % IV BOLUS (SEPSIS)
500.0000 mL | Freq: Once | INTRAVENOUS | Status: AC
  Administered 2014-07-13: 500 mL via INTRAVENOUS

## 2014-07-13 MED ORDER — LEVOTHYROXINE SODIUM 100 MCG PO TABS
175.0000 ug | ORAL_TABLET | Freq: Every day | ORAL | Status: DC
  Administered 2014-07-14 – 2014-07-17 (×4): 175 ug via ORAL
  Filled 2014-07-13 (×8): qty 1

## 2014-07-13 MED ORDER — POTASSIUM CHLORIDE IN NACL 20-0.9 MEQ/L-% IV SOLN
INTRAVENOUS | Status: AC
  Administered 2014-07-13: 22:00:00 via INTRAVENOUS

## 2014-07-13 MED ORDER — DIPHENHYDRAMINE HCL 25 MG PO CAPS
25.0000 mg | ORAL_CAPSULE | Freq: Every evening | ORAL | Status: DC | PRN
  Administered 2014-07-16: 25 mg via ORAL
  Filled 2014-07-13: qty 1

## 2014-07-13 MED ORDER — SODIUM CHLORIDE 0.9 % IV SOLN
INTRAVENOUS | Status: AC
  Administered 2014-07-13: 21:00:00 via INTRAVENOUS

## 2014-07-13 MED ORDER — ONDANSETRON HCL 4 MG PO TABS: 4.0000 mg | ORAL_TABLET | Freq: Four times a day (QID) | ORAL | Status: DC | PRN

## 2014-07-13 MED ORDER — ONDANSETRON HCL 4 MG/2ML IJ SOLN: 4.0000 mg | Freq: Four times a day (QID) | INTRAMUSCULAR | Status: DC | PRN

## 2014-07-13 MED ORDER — DIPHENOXYLATE-ATROPINE 2.5-0.025 MG PO TABS
2.0000 | ORAL_TABLET | Freq: Once | ORAL | Status: AC
  Administered 2014-07-13: 2 via ORAL
  Filled 2014-07-13: qty 2

## 2014-07-13 NOTE — ED Provider Notes (Signed)
CSN: [id number]     Arrival date & time 07/13/14  1445 History   This chart was scribed for Nat Christen, MD by Martinique Peace, ED Scribe. The patient was seen in [place]. The patient's care was started at 3:14 PM.    Chief Complaint  Patient presents with  . Abdominal Pain      Patient is a 53 y.o. female presenting with abdominal pain. The history is provided by the patient. No language interpreter was used.  Abdominal Pain Associated symptoms: diarrhea and vomiting   HPI Comments: Deborah Russo is a 53 y.o. female who presents to the Emergency Department complaining of upper abdominal pain onset 1 week ago that has gotten progressively worse. Pt states Dr. Chancy Milroy admitted her previously this month for Colitis. She reports she was suppose to have some an Endoscopy procedure performed, but it was cancelled due to severe bilateral swelling in her legs. Pt's relative states that this has been going on for months. Pt further reports she has not really much of an appetite and has been having diarrhea every time she goes to the bathroom that she describes as dark in appearance. Pt states she feels generally weak and reports that recently she has been restricted to very limited movement around the house. She denies history of DM, hypertension, or oxygen use at home. Pt is a current smoker (0.5 packs a day).  Pt states she hasn't had a doctor now for about a year now. Her hemoglobin levels read 6, but was told it should be around 12 or 13.  Past Medical History  Diagnosis Date  . Chronic pain   . Chronic migraine   . Chronic abdominal pain     since approximately 1991  . Nausea, vomiting, and diarrhea     recurrent, chronic  . Pain management   . Hypothyroidism   . Sleep apnea     has CPAP, but doesn't wear it bc she says "i can't sleep with it on"   Past Surgical History  Procedure Laterality Date  . Cesarean section    . Cholecystectomy    . Abdominal hysterectomy    . Abdominal  surgery     Family History  Problem Relation Age of Onset  . Asthma Mother   . Cancer Father    History  Substance Use Topics  . Smoking status: Current Every Day Smoker -- 0.50 packs/day for 15 years    Types: Cigarettes  . Smokeless tobacco: Never Used  . Alcohol Use: No   OB History   Grav Para Term Preterm Abortions TAB SAB Ect Mult Living                 Review of Systems  Constitutional: Positive for appetite change.  Cardiovascular: Positive for leg swelling.  Gastrointestinal: Positive for vomiting, abdominal pain and diarrhea.  A complete 10 system review of systems was obtained and all systems are negative except as noted in the HPI and PMH.      Allergies  Penicillins and Sulfa antibiotics  Home Medications   Prior to Admission medications   Medication Sig Start Date End Date Taking? Authorizing Provider  cyanocobalamin 500 MCG tablet Take 1 tablet (500 mcg total) by mouth daily. 06/22/14  Yes Maryann Mikhail, DO  diazepam (VALIUM) 5 MG tablet Take 5 mg by mouth 3 (three) times daily.   Yes Historical Provider, MD  diphenhydrAMINE (BENADRYL) 25 mg capsule Take 25 mg by mouth at bedtime as needed for allergies.  Yes Historical Provider, MD  doxycycline (VIBRAMYCIN) 100 MG capsule Take 1 capsule (100 mg total) by mouth 2 (two) times daily. 07/01/14  Yes Richarda Blade, MD  levothyroxine (SYNTHROID, LEVOTHROID) 175 MCG tablet Take 1 tablet (175 mcg total) by mouth daily before breakfast. 04/08/14  Yes Maudry Diego, MD  loperamide (IMODIUM) 2 MG capsule Take 2 mg by mouth 2 (two) times daily.   Yes Historical Provider, MD  metroNIDAZOLE (FLAGYL) 500 MG tablet Take 1 tablet (500 mg total) by mouth every 8 (eight) hours. 06/22/14  Yes Maryann Mikhail, DO  ondansetron (ZOFRAN) 4 MG tablet Take 1 tablet (4 mg total) by mouth every 6 (six) hours as needed for nausea. 06/22/14  Yes Maryann Mikhail, DO  OxyCODONE (OXYCONTIN) 80 mg T12A 12 hr tablet Take 80 mg by mouth every 12  (twelve) hours.   Yes Historical Provider, MD  saccharomyces boulardii (FLORASTOR) 250 MG capsule Take 1 capsule (250 mg total) by mouth 2 (two) times daily. 06/22/14  Yes Maryann Mikhail, DO  SUMAtriptan (IMITREX) 100 MG tablet Take 100 mg by mouth every 2 (two) hours as needed for migraine.   Yes Historical Provider, MD   BP 132/99  Pulse 95  Temp(Src) 98.1 F (36.7 C) (Oral)  Resp 18  SpO2 98% Physical Exam  Nursing note and vitals reviewed. Constitutional: She is oriented to person, place, and time. She appears well-developed and well-nourished.  HENT:  Head: Normocephalic and atraumatic.  Eyes: Conjunctivae and EOM are normal. Pupils are equal, round, and reactive to light.  Neck: Normal range of motion. Neck supple.  Cardiovascular: Normal rate, regular rhythm and normal heart sounds.   Pulmonary/Chest: Effort normal and breath sounds normal.  Abdominal: Soft. Bowel sounds are normal. There is tenderness (upper abdomen).  Musculoskeletal: Normal range of motion.  Neurological: She is alert and oriented to person, place, and time.  Skin: Skin is warm and dry.  Psychiatric: She has a normal mood and affect. Her behavior is normal.    ED Course  Procedures (including critical care time)  Medications  vancomycin (VANCOCIN) 50 mg/mL oral solution 125 mg (125 mg Oral Given 07/16/14 1123)  saccharomyces boulardii (FLORASTOR) capsule 250 mg (250 mg Oral Given 07/16/14 0905)  OxyCODONE (OXYCONTIN) 12 hr tablet 80 mg (80 mg Oral Given 07/16/14 0906)  levothyroxine (SYNTHROID, LEVOTHROID) tablet 175 mcg (175 mcg Oral Given 07/16/14 0906)  diazepam (VALIUM) tablet 5 mg (5 mg Oral Given 07/16/14 0906)  diphenhydrAMINE (BENADRYL) capsule 25 mg (not administered)  0.9 % NaCl with KCl 20 mEq/ L  infusion ( Intravenous New Bag/Given 07/13/14 2138)  ondansetron (ZOFRAN) tablet 4 mg (not administered)    Or  ondansetron (ZOFRAN) injection 4 mg (not administered)  pneumococcal 23 valent vaccine  (PNU-IMMUNE) injection 0.5 mL (0.5 mLs Intramuscular Not Given 07/14/14 0930)  SUMAtriptan (IMITREX) tablet 100 mg (not administered)  famotidine (PEPCID) tablet 20 mg (20 mg Oral Given 07/16/14 0906)  oxyCODONE (Oxy IR/ROXICODONE) immediate release tablet 10 mg (10 mg Oral Given 07/16/14 1123)  heparin injection 5,000 Units (5,000 Units Subcutaneous Given 07/16/14 0546)  0.9 %  sodium chloride infusion ( Intravenous Not Given 07/15/14 1730)  SUMAtriptan (IMITREX) tablet 100 mg (100 mg Oral Given 07/16/14 1036)  sodium chloride 0.9 % bolus 500 mL (0 mLs Intravenous Stopped 07/13/14 1615)  morphine 4 MG/ML injection 4 mg (4 mg Intravenous Given 07/13/14 1543)  ondansetron (ZOFRAN) injection 4 mg (4 mg Intravenous Given 07/13/14 1543)  diphenoxylate-atropine (LOMOTIL) 2.5-0.025 MG  per tablet 2 tablet (2 tablets Oral Given 07/13/14 1658)  morphine 4 MG/ML injection 4 mg (4 mg Intravenous Given 07/13/14 1807)  0.9 %  sodium chloride infusion ( Intravenous Stopped 07/14/14 0339)     DIAGNOSTIC STUDIES: Oxygen Saturation is 98% on room air, normal by my interpretation.    COORDINATION OF CARE: 3:20 PM- Treatment plan was discussed with patient who verbalizes understanding and agrees.     Labs Review Results for orders placed during the hospital encounter of 07/13/14  STOOL CULTURE      Result Value Ref Range   Specimen Description STOOL     Special Requests NONE     Culture       Value: Culture reincubated for better growth     Performed at [facility]   Report Status PENDING    CBC WITH DIFFERENTIAL      Result Value Ref Range   WBC 8.5  4.0 - 10.5 K/uL   RBC 3.82 (*) 3.87 - 5.11 MIL/uL   Hemoglobin 12.1  12.0 - 15.0 g/dL   HCT 38.5  36.0 - 46.0 %   MCV 100.8 (*) 78.0 - 100.0 fL   MCH 31.7  26.0 - 34.0 pg   MCHC 31.4  30.0 - 36.0 g/dL   RDW 18.5 (*) 11.5 - 15.5 %   Platelets 281  150 - 400 K/uL   Neutrophils Relative % 70  43 - 77 %   Neutro Abs 6.0  1.7 - 7.7 K/uL    Lymphocytes Relative 24  12 - 46 %   Lymphs Abs 2.0  0.7 - 4.0 K/uL   Monocytes Relative 5  3 - 12 %   Monocytes Absolute 0.4  0.1 - 1.0 K/uL   Eosinophils Relative 1  0 - 5 %   Eosinophils Absolute 0.1  0.0 - 0.7 K/uL   Basophils Relative 0  0 - 1 %   Basophils Absolute 0.0  0.0 - 0.1 K/uL  COMPREHENSIVE METABOLIC PANEL      Result Value Ref Range   Sodium 138  137 - 147 mEq/L   Potassium 3.2 (*) 3.7 - 5.3 mEq/L   Chloride 98  96 - 112 mEq/L   CO2 30  19 - 32 mEq/L   Glucose, Bld 140 (*) 70 - 99 mg/dL   BUN 4 (*) 6 - 23 mg/dL   Creatinine, Ser 0.59  0.50 - 1.10 mg/dL   Calcium 9.0  8.4 - 10.5 mg/dL   Total Protein 7.1  6.0 - 8.3 g/dL   Albumin 2.8 (*) 3.5 - 5.2 g/dL   AST 14  0 - 37 U/L   ALT 5  0 - 35 U/L   Alkaline Phosphatase 129 (*) 39 - 117 U/L   Total Bilirubin 0.3  0.3 - 1.2 mg/dL   GFR calc non Af Amer >90  >90 mL/min   GFR calc Af Amer >90  >90 mL/min   Anion gap 10  5 - 15  LIPASE, BLOOD      Result Value Ref Range   Lipase 22  11 - 59 U/L  URINALYSIS, ROUTINE W REFLEX MICROSCOPIC      Result Value Ref Range   Color, Urine AMBER (*) YELLOW   APPearance HAZY (*) CLEAR   Specific Gravity, Urine 1.015  1.005 - 1.030   pH 8.0  5.0 - 8.0   Glucose, UA NEGATIVE  NEGATIVE mg/dL   Hgb urine dipstick NEGATIVE  NEGATIVE   Bilirubin  Urine NEGATIVE  NEGATIVE   Ketones, ur NEGATIVE  NEGATIVE mg/dL   Protein, ur NEGATIVE  NEGATIVE mg/dL   Urobilinogen, UA 0.2  0.0 - 1.0 mg/dL   Nitrite NEGATIVE  NEGATIVE   Leukocytes, UA NEGATIVE  NEGATIVE  URINE RAPID DRUG SCREEN (HOSP PERFORMED)      Result Value Ref Range   Opiates POSITIVE (*) NONE DETECTED   Cocaine NONE DETECTED  NONE DETECTED   Benzodiazepines POSITIVE (*) NONE DETECTED   Amphetamines NONE DETECTED  NONE DETECTED   Tetrahydrocannabinol POSITIVE (*) NONE DETECTED   Barbiturates NONE DETECTED  NONE DETECTED  LACTIC ACID, PLASMA      Result Value Ref Range   Lactic Acid, Venous 1.0  0.5 - 2.2 mmol/L  BASIC  METABOLIC PANEL      Result Value Ref Range   Sodium 139  137 - 147 mEq/L   Potassium 3.8  3.7 - 5.3 mEq/L   Chloride 101  96 - 112 mEq/L   CO2 28  19 - 32 mEq/L   Glucose, Bld 130 (*) 70 - 99 mg/dL   BUN 5 (*) 6 - 23 mg/dL   Creatinine, Ser 0.63  0.50 - 1.10 mg/dL   Calcium 8.6  8.4 - 10.5 mg/dL   GFR calc non Af Amer >90  >90 mL/min   GFR calc Af Amer >90  >90 mL/min   Anion gap 10  5 - 15  CBC      Result Value Ref Range   WBC 6.5  4.0 - 10.5 K/uL   RBC 3.51 (*) 3.87 - 5.11 MIL/uL   Hemoglobin 11.1 (*) 12.0 - 15.0 g/dL   HCT 36.2  36.0 - 46.0 %   MCV 103.1 (*) 78.0 - 100.0 fL   MCH 31.6  26.0 - 34.0 pg   MCHC 30.7  30.0 - 36.0 g/dL   RDW 18.1 (*) 11.5 - 15.5 %   Platelets 261  150 - 400 K/uL  TYPE AND SCREEN      Result Value Ref Range   ABO/RH(D) B POS     Antibody Screen NEG     Sample Expiration 07/16/2014     Dg Chest 2 View  06/19/2014   CLINICAL DATA:  Weakness with nausea and vomiting.  EXAM: CHEST  2 VIEW  COMPARISON:  04/08/2014.  FINDINGS: Lungs are hyperexpanded. No edema or focal airspace consolidation. The cardio pericardial silhouette is enlarged. Telemetry leads overlie the chest. Imaged bony structures of the thorax are intact.  IMPRESSION: Stable.  No acute cardiopulmonary findings.   Electronically Signed   By: Misty Stanley M.D.   On: 06/19/2014 17:14   Ct Abdomen Pelvis W Contrast  06/19/2014   CLINICAL DATA:  Weakness, abdominal pain, vomiting and diarrhea for 7 months, status post hysterectomy and cholecystectomy.  EXAM: CT ABDOMEN AND PELVIS WITH CONTRAST  TECHNIQUE: Multidetector CT imaging of the abdomen and pelvis was performed using the standard protocol following bolus administration of intravenous contrast.  CONTRAST:  187m OMNIPAQUE IOHEXOL 300 MG/ML SOLN, 534mOMNIPAQUE IOHEXOL 300 MG/ML SOLN  COMPARISON:  09/19/2013  FINDINGS: Sagittal images of the spine are unremarkable. Atherosclerotic calcification and plaques are noted abdominal aorta and iliac  arteries. No aortic aneurysm. There is hepatic fatty infiltration. The patient is status postcholecystectomy. Small pneumobilia probable postcholecystectomy in left hepatic lobe. The pancreas, spleen and adrenal glands are unremarkable. Kidneys are symmetrical in size and enhancement. No hydronephrosis or hydroureter. There is nonobstructive calcified calculus in midpole of  the right kidney measures 3 mm. No calcified ureteral calculi are noted.  There is no pericecal inflammation. There is nonspecific mild thickening of the right colonic wall with fatty appearance. This is best visualized in coronal image 44. Nonspecific mild colitis or inflammatory bowel disease cannot be excluded. Clinical correlation is necessary.  No small bowel or colonic obstruction  No distal colonic obstruction. The patient is status post hysterectomy. No pelvic ascites or adenopathy. No destructive bony lesions are noted within pelvis.  Delayed renal images shows bilateral renal symmetrical excretion.  IMPRESSION: 1. Fatty infiltration of the liver. Status post cholecystectomy. Mild pneumobilia in left hepatic lobe. 2. No hydronephrosis or hydroureter. 3. Nonspecific mild fatty thickening of right colonic wall. This is best visualized in coronal image 44. Nonspecific mild colitis cannot be excluded. Clinical correlation is necessary. 4. No pericecal inflammation. No small bowel or colonic obstruction. 5. Status post hysterectomy.   Electronically Signed   By: Lahoma Crocker M.D.   On: 06/19/2014 17:08   US Venous Img Lower Unilateral Right  07/01/2014   CLINICAL DATA:  Right lower extremity pain and edema.  Recent fall.  EXAM: RIGHT LOWER EXTREMITY VENOUS DOPPLER ULTRASOUND  TECHNIQUE: Gray-scale sonography with graded compression, as well as color Doppler and duplex ultrasound were performed to evaluate the lower extremity deep venous systems from the level of the common femoral vein and including the common femoral, femoral, profunda  femoral, popliteal and calf veins including the posterior tibial, peroneal and gastrocnemius veins when visible. The superficial great saphenous vein was also interrogated. Spectral Doppler was utilized to evaluate flow at rest and with distal augmentation maneuvers in the common femoral, femoral and popliteal veins.  COMPARISON:  None.  FINDINGS: Common Femoral Vein: No evidence of thrombus. Normal compressibility, respiratory phasicity and response to augmentation.  Saphenofemoral Junction: No evidence of thrombus. Normal compressibility and flow on color Doppler imaging.  Profunda Femoral Vein: No evidence of thrombus. Normal compressibility and flow on color Doppler imaging.  Femoral Vein: No evidence of thrombus. Normal compressibility, respiratory phasicity and response to augmentation.  Popliteal Vein: No evidence of thrombus. Normal compressibility, respiratory phasicity and response to augmentation.  Calf Veins: No evidence of thrombus. Normal compressibility and flow on color Doppler imaging.  Superficial Great Saphenous Vein: No evidence of thrombus. Normal compressibility and flow on color Doppler imaging.  Venous Reflux:  None.  Other Findings: No evidence of superficial thrombophlebitis or abnormal fluid collection.  IMPRESSION: No evidence of right lower extremity deep venous thrombosis.   Electronically Signed   By: Aletta Edouard M.D.   On: 07/01/2014 09:20      Imaging Review No results found.   EKG Interpretation None      MDM   Final diagnoses:  Abdominal pain, unspecified site  No acute abdomen. suspect C. difficile colitis. Start oral vancomycin. Admit to general medicine   I personally performed the services described in this documentation, which was scribed in my presence. The recorded information has been reviewed and is accurate.    Nat Christen, MD 07/16/14 1400

## 2014-07-13 NOTE — ED Notes (Signed)
C/o abdominal pain x1 week that is progressively getting worse. Was treated back in July for Colitis and was scheduled for an endo procedure but it was cancelled due to swelling in her legs.

## 2014-07-13 NOTE — ED Notes (Signed)
Notified Dr Lacinda Axon that pt was still in pain and that we are unable to obtain a urine specimen as pt has diarrhea every time she urinates.

## 2014-07-13 NOTE — H&P (Signed)
Chief Complaint:  abd pain diarrhea  HPI: 53 yo female with h/o chronic abd pain, recent cdiff colitis was on flagyl??(pt unsure, big white pills) that she is still on per her report comes in with persistent diarrhea, feeling weak and epigastric abd pain.  She has been having epi abd pain for some time, and was suppose to have egd last week but was canceled due to ble swelling.  Denies fevers/chills.  No n/v.  Diarrhea nonbloody.  This epigastric abd pain is new for her, her chronic abd pain is usually under left rib cage told this was due to nerve demage but fetus during pregnancy.    Review of Systems:  Positive and negative as per HPI otherwise all other systems are negative  Past Medical History: Past Medical History  Diagnosis Date  . Chronic pain   . Chronic migraine   . Chronic abdominal pain     since approximately 1991  . Nausea, vomiting, and diarrhea     recurrent, chronic  . Pain management   . Hypothyroidism   . Sleep apnea     has CPAP, but doesn't wear it bc she says "i can't sleep with it on"   Past Surgical History  Procedure Laterality Date  . Cesarean section    . Cholecystectomy    . Abdominal hysterectomy    . Abdominal surgery      Medications: Prior to Admission medications   Medication Sig Start Date End Date Taking? Authorizing Provider  cyanocobalamin 500 MCG tablet Take 1 tablet (500 mcg total) by mouth daily. 06/22/14  Yes Maryann Mikhail, DO  diazepam (VALIUM) 5 MG tablet Take 5 mg by mouth 3 (three) times daily.   Yes Historical Provider, MD  diphenhydrAMINE (BENADRYL) 25 mg capsule Take 25 mg by mouth at bedtime as needed for allergies.   Yes Historical Provider, MD  doxycycline (VIBRAMYCIN) 100 MG capsule Take 1 capsule (100 mg total) by mouth 2 (two) times daily. 07/01/14  Yes Richarda Blade, MD  levothyroxine (SYNTHROID, LEVOTHROID) 175 MCG tablet Take 1 tablet (175 mcg total) by mouth daily before breakfast. 04/08/14  Yes Maudry Diego, MD   loperamide (IMODIUM) 2 MG capsule Take 2 mg by mouth 2 (two) times daily.   Yes Historical Provider, MD  OxyCODONE (OXYCONTIN) 80 mg T12A 12 hr tablet Take 80 mg by mouth every 12 (twelve) hours.   Yes Historical Provider, MD  SUMAtriptan (IMITREX) 100 MG tablet Take 100 mg by mouth every 2 (two) hours as needed for migraine.   Yes Historical Provider, MD  metroNIDAZOLE (FLAGYL) 500 MG tablet Take 1 tablet (500 mg total) by mouth every 8 (eight) hours. 06/22/14   Maryann Mikhail, DO  ondansetron (ZOFRAN) 4 MG tablet Take 1 tablet (4 mg total) by mouth every 6 (six) hours as needed for nausea. 06/22/14   Maryann Mikhail, DO  saccharomyces boulardii (FLORASTOR) 250 MG capsule Take 1 capsule (250 mg total) by mouth 2 (two) times daily. 06/22/14   Maryann Mikhail, DO    Allergies:   Allergies  Allergen Reactions  . Penicillins Hives  . Sulfa Antibiotics Hives    Social History:  reports that she has been smoking Cigarettes.  She has a 7.5 pack-year smoking history. She has never used smokeless tobacco. She reports that she does not drink alcohol or use illicit drugs.  Family History: Family History  Problem Relation Age of Onset  . Asthma Mother   . Cancer Father  Physical Exam: Filed Vitals:   07/13/14 1440 07/13/14 1600  BP: 132/99 160/94  Pulse: 95 90  Temp: 98.1 F (36.7 C)   TempSrc: Oral   Resp: 18   SpO2: 98% 98%   General appearance: alert, cooperative and no distress Head: Normocephalic, without obvious abnormality, atraumatic Eyes: negative Nose: Nares normal. Septum midline. Mucosa normal. No drainage or sinus tenderness. Neck: no JVD and supple, symmetrical, trachea midline Lungs: clear to auscultation bilaterally Heart: regular rate and rhythm, S1, S2 normal, no murmur, click, rub or gallop Abdomen: normal findings: bowel sounds normal soft ttp epigastrum nd no r/g Extremities: extremities normal, atraumatic, no cyanosis or edema Pulses: 2+ and symmetric Skin:  Skin color, texture, turgor normal. No rashes or lesions Neurologic: Grossly normal  Labs on Admission:   Recent Labs  07/13/14 1544  NA 138  K 3.2*  CL 98  CO2 30  GLUCOSE 140*  BUN 4*  CREATININE 0.59  CALCIUM 9.0    Recent Labs  07/13/14 1544  AST 14  ALT 5  ALKPHOS 129*  BILITOT 0.3  PROT 7.1  ALBUMIN 2.8*    Recent Labs  07/13/14 1544  LIPASE 22    Recent Labs  07/13/14 1544  WBC 8.5  NEUTROABS 6.0  HGB 12.1  HCT 38.5  MCV 100.8*  PLT 281   Radiological Exams on Admission: Dg Abd Acute W/chest  07/13/2014   CLINICAL DATA:  Pain and rectal bleeding  EXAM: ACUTE ABDOMEN SERIES (ABDOMEN 2 VIEW & CHEST 1 VIEW)  COMPARISON:  Chest radiograph June 19, 2014; CT abdomen and pelvis July 09, 2014  FINDINGS: PA chest: No edema or consolidation. Heart is borderline enlarged with normal pulmonary vascularity. No adenopathy.  Supine and upright abdomen: There is stool throughout the colon, moderate. Bowel gas pattern is unremarkable. No obstruction or free air. There is vascular calcification in the pelvis. There are surgical clips in the right upper quadrant.  IMPRESSION: Moderate stool throughout colon. Bowel gas pattern unremarkable. No edema or consolidation. Heart prominent but stable.   Electronically Signed   By: Lowella Grip M.D.   On: 07/13/2014 16:35    Assessment/Plan  53 yo female with acute abdominal pain, diarrhea with recent dx of cdiff colitis  Principal Problem:   Diarrhea-  Ck stool cultures, cdiff.  Pt afebrile, and wbc normal so unclear if this is persistent infection or not.  Place on vanco.  Obtain gi consult.  ivf overnight.  Active Problems:   Hypothyroidism   Hypokalemia-  Replete with ivf   Chronic abdominal pain   H/O Clostridium difficile infection   FTT (failure to thrive) in adult  obs on med.  Full code.  Jahmere Bramel A 07/13/2014, 7:54 PM

## 2014-07-14 DIAGNOSIS — D538 Other specified nutritional anemias: Secondary | ICD-10-CM

## 2014-07-14 DIAGNOSIS — R1013 Epigastric pain: Secondary | ICD-10-CM | POA: Diagnosis not present

## 2014-07-14 DIAGNOSIS — R109 Unspecified abdominal pain: Secondary | ICD-10-CM | POA: Diagnosis not present

## 2014-07-14 DIAGNOSIS — R197 Diarrhea, unspecified: Secondary | ICD-10-CM

## 2014-07-14 DIAGNOSIS — G8929 Other chronic pain: Secondary | ICD-10-CM

## 2014-07-14 LAB — BASIC METABOLIC PANEL
ANION GAP: 10 (ref 5–15)
BUN: 5 mg/dL — AB (ref 6–23)
CHLORIDE: 101 meq/L (ref 96–112)
CO2: 28 mEq/L (ref 19–32)
CREATININE: 0.63 mg/dL (ref 0.50–1.10)
Calcium: 8.6 mg/dL (ref 8.4–10.5)
GFR calc Af Amer: >90 mL/min (ref >90)
GFR calc non Af Amer: >90 mL/min (ref >90)
GLUCOSE: 130 mg/dL — AB (ref 70–99)
Potassium: 3.8 mEq/L (ref 3.7–5.3)
Sodium: 139 mEq/L (ref 137–147)

## 2014-07-14 LAB — CBC
HEMATOCRIT: 36.2 % (ref 36.0–46.0)
Hemoglobin: 11.1 g/dL — ABNORMAL LOW (ref 12.0–15.0)
MCH: 31.6 pg (ref 26.0–34.0)
MCHC: 30.7 g/dL (ref 30.0–36.0)
MCV: 103.1 fL — ABNORMAL HIGH (ref 78.0–100.0)
Platelets: 261 10*3/uL (ref 150–400)
RBC: 3.51 MIL/uL — ABNORMAL LOW (ref 3.87–5.11)
RDW: 18.1 % — ABNORMAL HIGH (ref 11.5–15.5)
WBC: 6.5 10*3/uL (ref 4.0–10.5)

## 2014-07-14 MED ORDER — OXYCODONE HCL 5 MG PO TABS
10.0000 mg | ORAL_TABLET | ORAL | Status: DC | PRN
  Administered 2014-07-14 – 2014-07-17 (×12): 10 mg via ORAL
  Filled 2014-07-14 (×12): qty 2

## 2014-07-14 MED ORDER — SUMATRIPTAN SUCCINATE 100 MG PO TABS
100.0000 mg | ORAL_TABLET | Freq: Once | ORAL | Status: DC
  Filled 2014-07-14: qty 1

## 2014-07-14 MED ORDER — VANCOMYCIN 50 MG/ML ORAL SOLUTION
ORAL | Status: AC
  Filled 2014-07-14: qty 5

## 2014-07-14 MED ORDER — FAMOTIDINE 20 MG PO TABS
20.0000 mg | ORAL_TABLET | Freq: Every day | ORAL | Status: DC
  Administered 2014-07-14 – 2014-07-17 (×4): 20 mg via ORAL
  Filled 2014-07-14 (×4): qty 1

## 2014-07-14 NOTE — Progress Notes (Addendum)
Patient c/o severe pain in abdomen and requesting pain medication. Dr. Shanon Brow notified. Ordered for scheduled pain med to be given at this time. Also added one-time dose of Imitrex for headache per patient's request. Will give meds and continue to monitor.

## 2014-07-14 NOTE — Progress Notes (Signed)
PROGRESS NOTE  Deborah Russo OMB:[id number] DOB: 1961/06/15 DOA: 07/13/2014 PCP: No PCP Per Patient  Summary: 53 year old woman with history of chronic abdominal pain, recent C. difficile colitis 3 weeks ago presented with persistent diarrhea generalized weakness and epigastric pain. History of bilateral lower extremity swelling. Admitted for diarrhea and further evaluation, GI consultation.  Assessment/Plan: 1. Reported diarrhea. Possible recurrent C. difficile colitis. Awaiting C. difficile collection. She is afebrile and without leukocytosis. 2. Epigastric abdominal pain. Pain on examination. Lipase was normal. LFTs notable for decreased alkaline phosphatase, normalization of AST. Normal bilirubin. CT of the abdomen and pelvis 06/19/2014 without acute abnormalities with exception of mild thickening of the right colonic wall at that time. 3. Query alcohol abuse based on elevated AST the patient denies. 4. Tobacco dependence. Will recommend cessation. 5. Chronic left rib cage pain. Stable. 6. Obstructive sleep apnea, refuses CPAP. 7. Hypothyroidism. Anemia 8. Macrocytic anemia, B12 deficiency 9. Moderate stool throughout the colon   Appears clinically stable. Unclear significance of reported diarrhea. Await stool studies. For now continue oral vancomycin. Gastroenterology consultation pending. Significance of epigastric pain is unclear but her exam is benign. Continue to follow.  Case management consultation to establish with PCP  Hopefully home 7/27.  Code Status: full code DVT prophylaxis: SCDs Family Communication: none present Disposition Plan: home  Murray Hodgkins, MD  [facility]  Pager [phone] If 7PM-7AM, please contact night-coverage at [email or web address], password [facility] 07/14/2014, 11:37 AM  LOS: 1 day   Consultants:  Gastroenterology  Procedures:    Antibiotics:  Oral vancomycin 7/25 >>   HPI/Subjective: She continues to complain of some epigastric  pain in addition to her chronic left upper quadrant/rib pain. No vomiting. Some diarrhea.  Objective: Filed Vitals:   07/13/14 2015 07/13/14 2102 07/14/14 0449 07/14/14 0500  BP:  134/84 108/70   Pulse: 85 90 85   Temp:  98.3 F (36.8 C) 98.3 F (36.8 C)   TempSrc:  Oral Oral   Resp:  18 15   Height:  5' 3"  (1.6 m)    Weight:  82 kg (180 lb 12.4 oz)  82.5 kg (181 lb 14.1 oz)  SpO2: 98% 98%      Intake/Output Summary (Last 24 hours) at 07/14/14 1137 Last data filed at 07/14/14 0947  Gross per 24 hour  Intake  867.5 ml  Output      0 ml  Net  867.5 ml     Filed Weights   07/13/14 2102 07/14/14 0500  Weight: 82 kg (180 lb 12.4 oz) 82.5 kg (181 lb 14.1 oz)    Exam:     Afebrile, vital signs stable. No hypoxia.  Gen. Appears calm. Nontoxic. Mildly uncomfortable.  Psych. Alert. Speech fluent and clear. Grossly normal mentation.  Cardiovascular. Regular rate and rhythm. No murmur, rub or gallop.  Respiratory. Clear to auscultation bilaterally. No wheezes, rales or rhonchi. Normal respiratory effort.  Abdomen. Soft, mild epigastric pain. No lower abdominal pain. Skin of the abdomen appears grossly unremarkable.  Data Reviewed: I/O: No bowel movements charted.   Chemistry: Basic metabolic panel unremarkable. Lactic acid normal on admission.   Heme: Hemoglobin stable.   ID: Urinalysis negative   Imaging: Moderate stool throughout the colon seen on acute abdominal series.  Scheduled Meds: . diazepam  5 mg Oral TID  . famotidine  20 mg Oral Daily  . levothyroxine  175 mcg Oral QAC breakfast  . OxyCODONE  80 mg Oral Q12H  . pneumococcal 23 valent vaccine  0.5  mL Intramuscular Tomorrow-1000  . saccharomyces boulardii  250 mg Oral BID  . SUMAtriptan  100 mg Oral Once  . vancomycin  125 mg Oral 4 times per day   Continuous Infusions:   Principal Problem:   Diarrhea Active Problems:   Hypothyroidism   Hypokalemia   Chronic abdominal pain   H/O Clostridium  difficile infection   FTT (failure to thrive) in adult   Abdominal pain, unspecified site   Time spent 20 minutes

## 2014-07-14 NOTE — Consult Note (Signed)
Referring Provider: No ref. provider found Primary Care Physician:  No PCP Per Patient Primary Gastroenterologist:  DR.REHMAN  Reason for Consultation:  ANEMIA   Impression: ADMITTED WITH INCREASING WEAKNESS AND DIARRHEA. HAD NOT COMPLETED FLAGYL. EGD CANCELLED 7/13 DUE TO FLUID OVERLOAD. SWELLING BETTER. HB STABLE. HEARTBURN NOT IDEALLY CONTROLLED WITH TUMS AS AN OUTPATIENT. MAY HAVE C DIFF THAT FAILED TO RESPOND TO FLAGYL. CONSIDER DIARRHEA/ABD PAIN DUE TO POST-INFECTIOUS IBS, LESS LIKELY MICROSCOPIC COLITIS.  Plan: 1. EGD WITH PROPOFOL PRIOR TO DISCHARGE 2. CONTINUE VANCOMYCIN & FLORASTOR. 3. LACTOSE FREE DIET 4. PEPCID DAILY  5. AWAIT C DIFF PCR/STOOL Cx. IF PT DOES NOT IMPROVE CONSIDER FLEX SIG OR TCS.    HPI:  Pt came to ed for feeling weak, having diarrhea, pain in abdomen. PMHx: C DIFF Jun 21 2014. WAS ALMOST DONE WITH FLAGYL. BMs: < 10 A  DAYS, DEPENDS ON WHAT SHE EATS, AT LEAST 5/DAY. NO SAMPLE BECAUSE WHEN SHE PEES SHE POOPS. SWELLING IN LEGS RESOLVED. SUBJECTIVE CHILLS. RARE VOMITING. LAST SOLID STOOL: > 1 MO.HURTS AT TOP/MIDDLE OF STOMACH, [place], NO RADIATION. FOOD: MOST TIME MAKES PAIN WORSE. HEARTBURN A LOT-TUMS, DOESN'T TAKE PILLS FOR REFLUX.  PT DENIES FEVER, HEMATOCHEZIA, nausea, melena, CHEST PAIN, SHORTNESS OF BREATH,  Constipation, OR problems swallowing. LAST TCS: 10 YRS AGO.   Past Medical History  Diagnosis Date  . Chronic pain   . Chronic migraine   . Chronic abdominal pain     since approximately 1991  . Nausea, vomiting, and diarrhea     recurrent, chronic  . Pain management   . Hypothyroidism   . Sleep apnea     has CPAP, but doesn't wear it bc she says "i can't sleep with it on"    Past Surgical History  Procedure Laterality Date  . Cesarean section    . Cholecystectomy    . Abdominal hysterectomy    . Abdominal surgery      Prior to Admission medications   Medication Sig Start Date End Date Taking? Authorizing Provider  cyanocobalamin  500 MCG tablet Take 1 tablet (500 mcg total) by mouth daily. 06/22/14  Yes Maryann Mikhail, DO  diazepam (VALIUM) 5 MG tablet Take 5 mg by mouth 3 (three) times daily.   Yes Historical Provider, MD  diphenhydrAMINE (BENADRYL) 25 mg capsule Take 25 mg by mouth at bedtime as needed for allergies.   Yes Historical Provider, MD  doxycycline (VIBRAMYCIN) 100 MG capsule Take 1 capsule (100 mg total) by mouth 2 (two) times daily. 07/01/14  Yes Richarda Blade, MD  levothyroxine (SYNTHROID, LEVOTHROID) 175 MCG tablet Take 1 tablet (175 mcg total) by mouth daily before breakfast. 04/08/14  Yes Maudry Diego, MD  loperamide (IMODIUM) 2 MG capsule Take 2 mg by mouth 2 (two) times daily.   Yes Historical Provider, MD  OxyCODONE (OXYCONTIN) 80 mg T12A 12 hr tablet Take 80 mg by mouth every 12 (twelve) hours.   Yes Historical Provider, MD  SUMAtriptan (IMITREX) 100 MG tablet Take 100 mg by mouth every 2 (two) hours as needed for migraine.   Yes Historical Provider, MD  metroNIDAZOLE (FLAGYL) 500 MG tablet Take 1 tablet (500 mg total) by mouth every 8 (eight) hours. 06/22/14   Maryann Mikhail, DO  ondansetron (ZOFRAN) 4 MG tablet Take 1 tablet (4 mg total) by mouth every 6 (six) hours as needed for nausea. 06/22/14   Maryann Mikhail, DO  saccharomyces boulardii (FLORASTOR) 250 MG capsule Take 1 capsule (250 mg total) by mouth  2 (two) times daily. 06/22/14   Maryann Mikhail, DO    Current Facility-Administered Medications  Medication Dose Route Frequency Provider Last Rate Last Dose  . diazepam (VALIUM) tablet 5 mg  5 mg Oral TID Phillips Grout, MD   5 mg at 07/14/14 0929  . diphenhydrAMINE (BENADRYL) capsule 25 mg  25 mg Oral QHS PRN Phillips Grout, MD      . levothyroxine (SYNTHROID, LEVOTHROID) tablet 175 mcg  175 mcg Oral QAC breakfast Phillips Grout, MD   175 mcg at 07/14/14 0929  . ondansetron (ZOFRAN) tablet 4 mg  4 mg Oral Q6H PRN Phillips Grout, MD       Or  . ondansetron (ZOFRAN) injection 4 mg  4 mg  Intravenous Q6H PRN Phillips Grout, MD      . OxyCODONE (OXYCONTIN) 12 hr tablet 80 mg  80 mg Oral Q12H Phillips Grout, MD   80 mg at 07/14/14 4481  . pneumococcal 23 valent vaccine (PNU-IMMUNE) injection 0.5 mL  0.5 mL Intramuscular Tomorrow-1000 Rachal A Shanon Brow, MD      . saccharomyces boulardii (FLORASTOR) capsule 250 mg  250 mg Oral BID Phillips Grout, MD   250 mg at 07/14/14 0929  . SUMAtriptan (IMITREX) tablet 100 mg  100 mg Oral Once Phillips Grout, MD      . vancomycin (VANCOCIN) 50 mg/mL oral solution 125 mg  125 mg Oral 4 times per day Nat Christen, MD   125 mg at 07/14/14 0542    Allergies as of 07/13/2014 - Review Complete 07/13/2014  Allergen Reaction Noted  . Penicillins Hives 09/23/2012  . Sulfa antibiotics Hives 09/23/2012    Family History  Problem Relation Age of Onset  . Asthma Mother   . Cancer Father      History   Social History  . Marital Status: Divorced    Spouse Name: N/A    Number of Children: N/A  . Years of Education: N/A   Occupational History  . Not on file.   Social History Main Topics  . Smoking status: Current Every Day Smoker -- 0.50 packs/day for 15 years    Types: Cigarettes  . Smokeless tobacco: Never Used  . Alcohol Use: No  . Drug Use: No  . Sexual Activity: Yes    Birth Control/ Protection: Surgical   Review of Systems: PER HPI OTHERWISE ALL SYSTEMS ARE NEGATIVE.   Vitals: Blood pressure 108/70, pulse 85, temperature 98.3 F (36.8 C), temperature source Oral, resp. rate 15, height 5' 3"  (1.6 m), weight 181 lb 14.1 oz (82.5 kg), SpO2 98.00%.  Physical Exam: General:   Alert,  pleasant and cooperative in NAD Head:  Normocephalic and atraumatic. Eyes:  Sclera clear, no icterus.   Conjunctiva pink. Mouth:  No lesions, dentition ABnormal. Neck:  Supple; no masses. Lungs:  Clear throughout to auscultation.   No wheezes. No acute distress. Heart:  Regular rate and rhythm; no murmurs, clicks, rubs,  or gallops. Abdomen:  Soft,MILD  TO MODERATE TTP IN THE EPIGASTRIUM & BUQS,  nondistended. No masses,  noted. Normal bowel sounds, without guarding, and without rebound.   Msk:  Symmetrical without gross deformities. Normal posture. Extremities:  Without edema. Neurologic:  Alert and  oriented x4;  grossly normal neurologically. Cervical Nodes:  No significant cervical adenopathy. Psych:  Alert and cooperative. ANXIOUS mood and FLAT affect.   Lab Results:  Recent Labs  07/13/14 1544 07/14/14 0610  WBC 8.5 6.5  HGB 12.1  11.1*  HCT 38.5 36.2  PLT 281 261   BMET  Recent Labs  07/13/14 1544 07/14/14 0610  NA 138 139  K 3.2* 3.8  CL 98 101  CO2 30 28  GLUCOSE 140* 130*  BUN 4* 5*  CREATININE 0.59 0.63  CALCIUM 9.0 8.6   LFT  Recent Labs  07/13/14 1544  PROT 7.1  ALBUMIN 2.8*  AST 14  ALT 5  ALKPHOS 129*  BILITOT 0.3     Studies/Results: AAS 7/25-NAIAP   LOS: 1 day   Vikash Nest  07/14/2014, 9:36 AM

## 2014-07-15 DIAGNOSIS — Z8619 Personal history of other infectious and parasitic diseases: Secondary | ICD-10-CM

## 2014-07-15 DIAGNOSIS — G8929 Other chronic pain: Secondary | ICD-10-CM | POA: Diagnosis not present

## 2014-07-15 DIAGNOSIS — A0472 Enterocolitis due to Clostridium difficile, not specified as recurrent: Secondary | ICD-10-CM | POA: Diagnosis not present

## 2014-07-15 DIAGNOSIS — R1013 Epigastric pain: Secondary | ICD-10-CM | POA: Diagnosis not present

## 2014-07-15 DIAGNOSIS — R109 Unspecified abdominal pain: Secondary | ICD-10-CM | POA: Diagnosis not present

## 2014-07-15 MED ORDER — HEPARIN SODIUM (PORCINE) 5000 UNIT/ML IJ SOLN
5000.0000 [IU] | Freq: Three times a day (TID) | INTRAMUSCULAR | Status: DC
  Administered 2014-07-15 – 2014-07-16 (×5): 5000 [IU] via SUBCUTANEOUS
  Filled 2014-07-15 (×5): qty 1

## 2014-07-15 MED ORDER — SODIUM CHLORIDE 0.9 % IV SOLN: INTRAVENOUS | Status: DC

## 2014-07-15 NOTE — Progress Notes (Signed)
Patient seen, independently examined and chart reviewed. I agree with exam, assessment and plan discussed with Dyanne Carrel, NP.  Subjective: Feels low but are still has epigastric pain. Small amounts of diarrhea reported. Tolerated breakfast.  Objective: Afebrile, vital signs stable. No hypoxia.  Gen. Appears calm and comfortable. Nontoxic.  Psych. Alert. Speech fluent and clear.  Cardiovascular. Regular rate and rhythm. No murmur, rub or gallop.  Respiratory. Clear to auscultation bilaterally. No wheezes, rales or rhonchi. Normal respiratory effort.  Abdomen. Mild epigastric pain. Exam benign.   I/O: No bowel movements recorded  Mildly improved. Afebrile with stable hemodynamics. No evidence of severe infection. Await C. difficile results. Discussed with GI, planned for EGD 7/29. Further management per GI.  Murray Hodgkins, MD [facility] [phone]

## 2014-07-15 NOTE — Progress Notes (Signed)
[facility] PROGRESS NOTE  Deborah Russo WUJ:[id number] DOB: [birth year] DOA: 07/13/2014 PCP: No PCP Per Patient   Assessment/Plan:  1. Reported diarrhea. Concern for possible recurrent C. difficile colitis but no further episode diarrhea. On formed stool this am.  She remains afebrile and without leukocytosis. Continue oral vancomycin. Await GI assistance 2. Epigastric abdominal pain. Pain on examination. Lipase was normal. LFTs notable for decreased alkaline phosphatase, normalization of AST. Normal bilirubin. CT of the abdomen and pelvis 06/19/2014 without acute abnormalities with exception of mild thickening of the right colonic wall at that time. Seen by GI 07/14/14 and EGD recommended. Await input from patients primary GI. NPO 3. Query alcohol abuse based on elevated AST the patient denies.  4. Tobacco dependence. Cessation counseling offered 5. Chronic left rib cage pain. Stable. 6. Obstructive sleep apnea, refuses CPAP. 7. Hypothyroidism. Anemia 8. Macrocytic anemia stable. Appears to be above baseline at baseline   Code Status: full Family Communication: none present Disposition Plan: home hopefully tomorrow   Consultants:  Dr Laural Golden  Procedures:  none  Antibiotics:  Oral vancomycin 07/13/14>>  HPI/Subjective: Sitting up in bed watching TV. Complains of abdominal pain/nausea  Objective: Filed Vitals:   07/15/14 0648  BP: 128/64  Pulse: 76  Temp: 98.6 F (37 C)  Resp: 18    Intake/Output Summary (Last 24 hours) at 07/15/14 1156 Last data filed at 07/15/14 1135  Gross per 24 hour  Intake    360 ml  Output   1300 ml  Net   -940 ml   Filed Weights   07/13/14 2102 07/14/14 0500 07/15/14 0500  Weight: 82 kg (180 lb 12.4 oz) 82.5 kg (181 lb 14.1 oz) 83 kg (182 lb 15.7 oz)    Exam:   General:  Well nourished appears comfortable  Cardiovascular: RRR No MGR No LE edema  Respiratory: normal effort BS slightly distant but clear. No wheeze no  rhonchi  Abdomen: non-distended +BS mild tenderness epigastric area with gentle palpation.   Musculoskeletal: no clubbing no cyanosis   Data Reviewed: Basic Metabolic Panel:  Recent Labs Lab 07/13/14 1544 07/14/14 0610  NA 138 139  K 3.2* 3.8  CL 98 101  CO2 30 28  GLUCOSE 140* 130*  BUN 4* 5*  CREATININE 0.59 0.63  CALCIUM 9.0 8.6   Liver Function Tests:  Recent Labs Lab 07/13/14 1544  AST 14  ALT 5  ALKPHOS 129*  BILITOT 0.3  PROT 7.1  ALBUMIN 2.8*    Recent Labs Lab 07/13/14 1544  LIPASE 22   No results found for this basename: AMMONIA,  in the last 168 hours CBC:  Recent Labs Lab 07/13/14 1544 07/14/14 0610  WBC 8.5 6.5  NEUTROABS 6.0  --   HGB 12.1 11.1*  HCT 38.5 36.2  MCV 100.8* 103.1*  PLT 281 261   Cardiac Enzymes: No results found for this basename: CKTOTAL, CKMB, CKMBINDEX, TROPONINI,  in the last 168 hours BNP (last 3 results)  Recent Labs  04/08/14 0955  PROBNP 106.1   CBG: No results found for this basename: GLUCAP,  in the last 168 hours  No results found for this or any previous visit (from the past 240 hour(s)).   Studies: Dg Abd Acute W/chest  07/13/2014   CLINICAL DATA:  Pain and rectal bleeding  EXAM: ACUTE ABDOMEN SERIES (ABDOMEN 2 VIEW & CHEST 1 VIEW)  COMPARISON:  Chest radiograph June 19, 2014; CT abdomen and pelvis July 09, 2014  FINDINGS: PA chest: No edema  or consolidation. Heart is borderline enlarged with normal pulmonary vascularity. No adenopathy.  Supine and upright abdomen: There is stool throughout the colon, moderate. Bowel gas pattern is unremarkable. No obstruction or free air. There is vascular calcification in the pelvis. There are surgical clips in the right upper quadrant.  IMPRESSION: Moderate stool throughout colon. Bowel gas pattern unremarkable. No edema or consolidation. Heart prominent but stable.   Electronically Signed   By: Lowella Grip M.D.   On: 07/13/2014 16:35    Scheduled Meds: .  diazepam  5 mg Oral TID  . famotidine  20 mg Oral Daily  . levothyroxine  175 mcg Oral QAC breakfast  . OxyCODONE  80 mg Oral Q12H  . pneumococcal 23 valent vaccine  0.5 mL Intramuscular Tomorrow-1000  . saccharomyces boulardii  250 mg Oral BID  . SUMAtriptan  100 mg Oral Once  . vancomycin  125 mg Oral 4 times per day   Continuous Infusions:   Principal Problem:   Diarrhea Active Problems:   Hypothyroidism   Hypokalemia   Chronic abdominal pain   H/O Clostridium difficile infection   FTT (failure to thrive) in adult   Abdominal pain, unspecified site    Time spent: [facility] Pager [phone]. If 7PM-7AM, please contact night-coverage at [email or web address], password [facility] 07/15/2014, 11:56 AM  LOS: 2 days

## 2014-07-15 NOTE — Progress Notes (Signed)
Patient had formed stool, unable to test for c. Diff.

## 2014-07-15 NOTE — Clinical Social Work Note (Signed)
CSW received consult for advance directives. Chaplain notified and will try to meet with pt to complete. CSW signing off, but can be reconsulted if needed.  Benay Pike, [place]

## 2014-07-15 NOTE — Progress Notes (Signed)
INITIAL NUTRITION ASSESSMENT  DOCUMENTATION CODES Per approved criteria  -Obesity Unspecified   INTERVENTION: -Will follow for diet advancement -Will reassess for need for ONS at follow-up  NUTRITION DIAGNOSIS: Inadequate oral intake related to altered GI function as evidenced by NPO.   Goal: Pt will meet >90% of estimated nutritional needs  Monitor:  PO/supplement intake, diet advancement, labs, weight changes, I/O's  Reason for Assessment: MST=5  53 y.o. female  Admitting Dx: Diarrhea  ASSESSMENT: Pt with h/o chronic abd pain, recent cdiff colitis was on flagyl??(pt unsure, big white pills) that she is still on per her report comes in with persistent diarrhea, feeling weak and epigastric abd pain. She has been having epi abd pain for some time, and was suppose to have egd last week but was canceled due to ble swelling. Denies fevers/chills. No n/v. Diarrhea nonbloody. This epigastric abd pain is new for her, her chronic abd pain is usually under left rib cage told this was due to nerve demage but fetus during pregnancy.  Pt visiting with chaplain at time of visit. GI evaluation pending. Pt being tested for C-diff.  Noted meal tray was untouched, as pt was just made NPO. Previous diet order was heart healthy/carb modified, with good PO intake (PO: 75%).  Pt reluctant to answer questions. She reports "I don't know why you're here because they took me off of all my food". Unable to complete nutrition focused physical exam at this time.  Noted a 5.2% wt loss over the past month. Labs reviewed. Glucose: 130. Latest Hgb A1c reflects possibility of impaired glucose tolerance.   Height: Ht Readings from Last 1 Encounters:  07/13/14 5' 3"  (1.6 m)    Weight: Wt Readings from Last 1 Encounters:  07/15/14 182 lb 15.7 oz (83 kg)    Ideal Body Weight: 115#  % Ideal Body Weight: 158%  Wt Readings from Last 10 Encounters:  07/15/14 182 lb 15.7 oz (83 kg)  07/01/14 190 lb (86.183  kg)  06/27/14 193 lb (87.544 kg)  06/19/14 192 lb 11.2 oz (87.408 kg)  05/01/14 200 lb (90.719 kg)  04/08/14 200 lb (90.719 kg)  09/19/13 200 lb (90.719 kg)  09/18/13 200 lb (90.719 kg)  05/03/13 207 lb (93.895 kg)  03/28/13 198 lb (89.812 kg)    Usual Body Weight: 200#  % Usual Body Weight: 91%  BMI:  Body mass index is 32.42 kg/(m^2). Obesity, class I  Estimated Nutritional Needs: Kcal: 1800-2000 Protein: 83-100 grams Fluid: 1.8-2.0 L  Skin: blister on lt buttock  Diet Order: NPO  EDUCATION NEEDS: -No education needs identified at this time   Intake/Output Summary (Last 24 hours) at 07/15/14 1314 Last data filed at 07/15/14 1300  Gross per 24 hour  Intake    120 ml  Output   1200 ml  Net  -1080 ml    Last BM: 07/13/14  Labs:   Recent Labs Lab 07/13/14 1544 07/14/14 0610  NA 138 139  K 3.2* 3.8  CL 98 101  CO2 30 28  BUN 4* 5*  CREATININE 0.59 0.63  CALCIUM 9.0 8.6  GLUCOSE 140* 130*    CBG (last 3)  No results found for this basename: GLUCAP,  in the last 72 hours  Lab Results  Component Value Date   HGBA1C 5.9* 06/19/2014   Scheduled Meds: . diazepam  5 mg Oral TID  . famotidine  20 mg Oral Daily  . levothyroxine  175 mcg Oral QAC breakfast  . OxyCODONE  80 mg Oral Q12H  . pneumococcal 23 valent vaccine  0.5 mL Intramuscular Tomorrow-1000  . saccharomyces boulardii  250 mg Oral BID  . SUMAtriptan  100 mg Oral Once  . vancomycin  125 mg Oral 4 times per day    Continuous Infusions:   Past Medical History  Diagnosis Date  . Chronic pain   . Chronic migraine   . Chronic abdominal pain     since approximately 1991  . Nausea, vomiting, and diarrhea     recurrent, chronic  . Pain management   . Hypothyroidism   . Sleep apnea     has CPAP, but doesn't wear it bc she says "i can't sleep with it on"    Past Surgical History  Procedure Laterality Date  . Cesarean section    . Cholecystectomy    . Abdominal hysterectomy    .  Abdominal surgery      Johari Bennetts A. Jimmye Norman, RD, LDN Pager: [phone]

## 2014-07-15 NOTE — Progress Notes (Signed)
Patient states she is feeling better. Appetite is coming back. Today was the first and she was able to eat and did not have to run to the bathroom. She continues to complain of epigastric pain. Assessment; #1. Recurrent C. difficile colitis. Patient was treated with metronidazole earlier this month she relapsed. She is doing better with by mouth vancomycin eventually total of 2 weeks of therapy. #2. Acute and chronic epigastric pain. She was scheduled to undergo EGD on 07/01/2014. She was noted to have asymmetric lower extremity edema concerning for DVT; procedure was canceled. Evaluation in the emergency room was negative for DVT.  Recommendations; Patient will need 2 weeks of therapy with oral vancomycin. Diagnostic EGD under monitored anesthesia care on 07/17/2014.

## 2014-07-15 NOTE — Progress Notes (Signed)
Utilization review completed.  

## 2014-07-15 NOTE — Progress Notes (Signed)
Initial visit for patient's request for prayer and a referral for Advance Directives information.  Discussed information and gave patient Advance Direction packet. She stated she was interested in the information but did not at this time want to complete.   Spent time with patient offering emotional and spiritual support around her illness and how she was coping with changes.  Listening led to her sharing about losses;her mother and grandmother's deaths following by a suicide death of an adopted son at age 82 connected with drug abuse. She said this happened 16 years ago. She stated feeling her grief continues to be painful. She appears to have unresolved guilt issues connected to his death, along with other losses she is dealing with. We talked at length about this. Prayed with her and plan to follow up.

## 2014-07-16 DIAGNOSIS — G8929 Other chronic pain: Secondary | ICD-10-CM | POA: Diagnosis not present

## 2014-07-16 DIAGNOSIS — R109 Unspecified abdominal pain: Secondary | ICD-10-CM | POA: Diagnosis not present

## 2014-07-16 MED ORDER — SUMATRIPTAN SUCCINATE 50 MG PO TABS
100.0000 mg | ORAL_TABLET | ORAL | Status: DC | PRN
  Administered 2014-07-16: 100 mg via ORAL
  Filled 2014-07-16: qty 1

## 2014-07-16 NOTE — Progress Notes (Signed)
Patient reports a formed stool yesterday evening.  No stools yet today.

## 2014-07-16 NOTE — Progress Notes (Signed)
[facility] PROGRESS NOTE  Deborah Russo QTM:[id number] DOB: [birth year] DOA: 07/13/2014 PCP: No PCP Per Patient  53 y/o ?,  Known chronic abdominal pain chronic pain since 1991, macrocytic anemia, left axillary abscess 03/28/13,  Hypothyroidism  Admission recently 7/1-7/4 C. Difficile colitis readmitted 07/13/14 with diarrhea lower extremity pain  Assessment/Plan:  1. Reported diarrhea. Concern for possible recurrent C. difficile colitis but no further episode diarrheaSince admission. One formed stool yesterday. Patient reported to GI that she had 3 loose stools yesterday.   I have asked her to void urine regularly so we can collect a specimen of urine.  Educated to importance of testing stool. Nursing staff reports no loose stool as they assist to BR. She remains afebrile and without leukocytosis. Continue oral vancomycin. Scheduled EGD for tomorrow. Appreciate GI assistance 2. Epigastric abdominal pain. Pain on examination. Lipase was normal.  On admission  Alkaline phosphatase elevated at 7/25. Normal bilirubin. CT of the abdomen and pelvis 06/19/2014 without acute abnormalities with exception of mild thickening of the right colonic wall at that time. Seen by GI 07/14/14 and EGD recommended to be performed 7/28.  3. Query alcohol abuse based on elevated AST the patient denies. No signs/sx withdrawal  4. Tobacco dependence. Cessation counseling offered 5. Chronic left rib cage pain. Stable. 6. Obstructive sleep apnea, refuses CPAP. 7. Hypothyroidism. Anemia 8. Macrocytic anemia stable. Appears to be above baseline at baseline    Code Status: full Family Communication: none present Disposition Plan: home hopefully tomorrow   Consultants:  Dr Laural Golden gastroenterology  Procedures:  none  Antibiotics:  Oral vancomycin 07/13/14>>  HPI/Subjective:   doing fair no complaints  Presently  tolerating diet without event  cannot catch stool and urine as she has to do both same time  usually   Objective: Filed Vitals:   07/16/14 0552  BP: 167/79  Pulse: 76  Temp: 98 F (36.7 C)  Resp: 20    Intake/Output Summary (Last 24 hours) at 07/16/14 1342 Last data filed at 07/16/14 1006  Gross per 24 hour  Intake      0 ml  Output   1650 ml  Net  -1650 ml   Filed Weights   07/14/14 0500 07/15/14 0500 07/16/14 0500  Weight: 82.5 kg (181 lb 14.1 oz) 83 kg (182 lb 15.7 oz) 86.2 kg (190 lb 0.6 oz)    Exam:   General:  Well nourished appears comfortable  Cardiovascular: RRR No MGR No LE edema PPP  Respiratory: normal effort BS clear bilaterally no wheeze  Abdomen: obese soft +BS non-tender to palpation  Musculoskeletal: no clubbing or cyanosis   Data Reviewed: Basic Metabolic Panel:  Recent Labs Lab 07/13/14 1544 07/14/14 0610  NA 138 139  K 3.2* 3.8  CL 98 101  CO2 30 28  GLUCOSE 140* 130*  BUN 4* 5*  CREATININE 0.59 0.63  CALCIUM 9.0 8.6   Liver Function Tests:  Recent Labs Lab 07/13/14 1544  AST 14  ALT 5  ALKPHOS 129*  BILITOT 0.3  PROT 7.1  ALBUMIN 2.8*    Recent Labs Lab 07/13/14 1544  LIPASE 22   No results found for this basename: AMMONIA,  in the last 168 hours CBC:  Recent Labs Lab 07/13/14 1544 07/14/14 0610  WBC 8.5 6.5  NEUTROABS 6.0  --   HGB 12.1 11.1*  HCT 38.5 36.2  MCV 100.8* 103.1*  PLT 281 261   Cardiac Enzymes: No results found for this basename: CKTOTAL, CKMB, CKMBINDEX, TROPONINI,  in the last 168 hours BNP (last 3 results)  Recent Labs  04/08/14 0955  PROBNP 106.1   CBG: No results found for this basename: GLUCAP,  in the last 168 hours  Recent Results (from the past 240 hour(s))  STOOL CULTURE     Status: None   Collection Time    07/14/14 10:00 PM      Result Value Ref Range Status   Specimen Description STOOL   Final   Special Requests NONE   Final   Culture     Final   Value: Culture reincubated for better growth     Performed at [facility] PENDING    Incomplete     Studies: No results found.  Scheduled Meds: . diazepam  5 mg Oral TID  . famotidine  20 mg Oral Daily  . heparin subcutaneous  5,000 Units Subcutaneous 3 times per day  . levothyroxine  175 mcg Oral QAC breakfast  . OxyCODONE  80 mg Oral Q12H  . pneumococcal 23 valent vaccine  0.5 mL Intramuscular Tomorrow-1000  . saccharomyces boulardii  250 mg Oral BID  . SUMAtriptan  100 mg Oral Once  . vancomycin  125 mg Oral 4 times per day   Continuous Infusions: . sodium chloride      Principal Problem:   Diarrhea Active Problems:   Hypothyroidism   Hypokalemia   Chronic abdominal pain   H/O Clostridium difficile infection   FTT (failure to thrive) in adult   Abdominal pain, unspecified site    Time spent: 35 minutes    [facility] Pager [phone]. If 7PM-7AM, please contact night-coverage at [email or web address], password [facility] 07/16/2014, 1:42 PM  LOS: 3 days   I have seen this patient independently, discussed POC with MID-level provider and patient and ammended note and plan  where needed  Verneita Griffes, MD [facility] [phone]

## 2014-07-16 NOTE — Progress Notes (Addendum)
Patient ID: Deborah Russo, female   DOB: [birth year], 53 y.o.   MRN: [id number] She states her diarrhea is better.  She had 2-3 stools yesterday. Her stools are loose.  She says she does not feel better. She says she has epigastric pain.  H and H today 12.1 and 38.5. Recent hx of C-diff. Presently taking Vancomycin 150m QID. Colonoscopy 2003 normal: Dr.  DDelfin EdisFiled Vitals:   07/15/14 1452 07/15/14 2153 07/16/14 0500 07/16/14 0552  BP: 121/59 146/74  167/79  Pulse: 94 75  76  Temp: 98.3 F (36.8 C) 98.1 F (36.7 C)  98 F (36.7 C)  TempSrc: Oral Oral  Oral  Resp: 20 20  20   Height:      Weight:   190 lb 0.6 oz (86.2 kg)   SpO2: 100% 98%  100%   I/O last 3 completed shifts: In: 120 [P.O.:120] Out: 1400 [Urine:1400] Total I/O In: -  Out: 900 [Urine:900] CBC    Component Value Date/Time   WBC 6.5 07/14/2014 0610   RBC 3.51* 07/14/2014 0610   RBC 2.22* 06/22/2014 0635   HGB 11.1* 07/14/2014 0610   HCT 36.2 07/14/2014 0610   PLT 261 07/14/2014 0610   MCV 103.1* 07/14/2014 0610   MCH 31.6 07/14/2014 0610   MCHC 30.7 07/14/2014 0610   RDW 18.1* 07/14/2014 0610   LYMPHSABS 2.0 07/13/2014 1544   MONOABS 0.4 07/13/2014 1544   EOSABS 0.1 07/13/2014 1544   BASOSABS 0.0 07/13/2014 1544        Alert and oriented. Skin warm and dry. Oral mucosa is moist.   . Sclera anicteric, conjunctivae is pink. Thyroid not enlarged. No cervical lymphadenopathy. Lungs clear. Heart regular rate and rhythm.  Abdomen is soft. Bowel sounds are positive. No hepatomegaly. No abdominal masses felt. Epigastric tenderness.  No edema to lower extremities.    Scheduled for an EGD tomorrow 07/17/2014.    GI attending note; Patient feels much better. She is worried about her pain medications when she goes home. She apparently has taken more than her usual dose. She is agreeable to proceed with EGD in the a.m. Questions about procedure answered.

## 2014-07-16 NOTE — Progress Notes (Signed)
Utilization review completed.  

## 2014-07-17 ENCOUNTER — Observation Stay (HOSPITAL_COMMUNITY): Payer: Medicare Other | Admitting: Anesthesiology

## 2014-07-17 ENCOUNTER — Encounter (HOSPITAL_COMMUNITY)
Admission: EM | Disposition: A | Discharge status: Home / Self Care | Payer: Self-pay | Source: Home / Self Care | Attending: Family Medicine

## 2014-07-17 ENCOUNTER — Encounter (HOSPITAL_COMMUNITY): Payer: Self-pay | Admitting: *Deleted

## 2014-07-17 ENCOUNTER — Encounter (HOSPITAL_COMMUNITY): Payer: Medicare Other | Admitting: Anesthesiology

## 2014-07-17 DIAGNOSIS — K2960 Other gastritis without bleeding: Secondary | ICD-10-CM | POA: Diagnosis not present

## 2014-07-17 DIAGNOSIS — K319 Disease of stomach and duodenum, unspecified: Secondary | ICD-10-CM | POA: Diagnosis present

## 2014-07-17 DIAGNOSIS — F17200 Nicotine dependence, unspecified, uncomplicated: Secondary | ICD-10-CM | POA: Diagnosis present

## 2014-07-17 DIAGNOSIS — A0472 Enterocolitis due to Clostridium difficile, not specified as recurrent: Secondary | ICD-10-CM | POA: Diagnosis not present

## 2014-07-17 DIAGNOSIS — E039 Hypothyroidism, unspecified: Secondary | ICD-10-CM | POA: Diagnosis not present

## 2014-07-17 DIAGNOSIS — G8929 Other chronic pain: Secondary | ICD-10-CM | POA: Diagnosis present

## 2014-07-17 DIAGNOSIS — E876 Hypokalemia: Secondary | ICD-10-CM | POA: Diagnosis present

## 2014-07-17 DIAGNOSIS — K219 Gastro-esophageal reflux disease without esophagitis: Secondary | ICD-10-CM | POA: Diagnosis present

## 2014-07-17 DIAGNOSIS — Z79899 Other long term (current) drug therapy: Secondary | ICD-10-CM | POA: Diagnosis not present

## 2014-07-17 DIAGNOSIS — Z825 Family history of asthma and other chronic lower respiratory diseases: Secondary | ICD-10-CM | POA: Diagnosis not present

## 2014-07-17 DIAGNOSIS — D539 Nutritional anemia, unspecified: Secondary | ICD-10-CM | POA: Diagnosis present

## 2014-07-17 DIAGNOSIS — R1013 Epigastric pain: Secondary | ICD-10-CM | POA: Diagnosis not present

## 2014-07-17 DIAGNOSIS — R109 Unspecified abdominal pain: Secondary | ICD-10-CM | POA: Diagnosis present

## 2014-07-17 DIAGNOSIS — K766 Portal hypertension: Secondary | ICD-10-CM | POA: Diagnosis not present

## 2014-07-17 DIAGNOSIS — Z9089 Acquired absence of other organs: Secondary | ICD-10-CM | POA: Diagnosis not present

## 2014-07-17 DIAGNOSIS — K2970 Gastritis, unspecified, without bleeding: Secondary | ICD-10-CM | POA: Diagnosis not present

## 2014-07-17 DIAGNOSIS — K2990 Gastroduodenitis, unspecified, without bleeding: Secondary | ICD-10-CM | POA: Diagnosis not present

## 2014-07-17 DIAGNOSIS — R627 Adult failure to thrive: Secondary | ICD-10-CM | POA: Diagnosis not present

## 2014-07-17 DIAGNOSIS — G4733 Obstructive sleep apnea (adult) (pediatric): Secondary | ICD-10-CM | POA: Diagnosis present

## 2014-07-17 DIAGNOSIS — E538 Deficiency of other specified B group vitamins: Secondary | ICD-10-CM | POA: Diagnosis not present

## 2014-07-17 HISTORY — PX: ESOPHAGOGASTRODUODENOSCOPY: SHX5428

## 2014-07-17 HISTORY — PX: BIOPSY: SHX5522

## 2014-07-17 LAB — BASIC METABOLIC PANEL
ANION GAP: 10 (ref 5–15)
BUN: 8 mg/dL (ref 6–23)
CALCIUM: 9.2 mg/dL (ref 8.4–10.5)
CHLORIDE: 102 meq/L (ref 96–112)
CO2: 30 mEq/L (ref 19–32)
Creatinine, Ser: 0.79 mg/dL (ref 0.50–1.10)
GFR calc Af Amer: >90 mL/min (ref >90)
GFR calc non Af Amer: >90 mL/min (ref >90)
Glucose, Bld: 113 mg/dL — ABNORMAL HIGH (ref 70–99)
Potassium: 3.8 mEq/L (ref 3.7–5.3)
SODIUM: 142 meq/L (ref 137–147)

## 2014-07-17 LAB — CBC
HCT: 36.9 % (ref 36.0–46.0)
Hemoglobin: 11.5 g/dL — ABNORMAL LOW (ref 12.0–15.0)
MCH: 31.7 pg (ref 26.0–34.0)
MCHC: 31.2 g/dL (ref 30.0–36.0)
MCV: 101.7 fL — ABNORMAL HIGH (ref 78.0–100.0)
PLATELETS: 239 10*3/uL (ref 150–400)
RBC: 3.63 MIL/uL — AB (ref 3.87–5.11)
RDW: 17.8 % — ABNORMAL HIGH (ref 11.5–15.5)
WBC: 5.1 10*3/uL (ref 4.0–10.5)

## 2014-07-17 SURGERY — EGD (ESOPHAGOGASTRODUODENOSCOPY)
Anesthesia: Monitor Anesthesia Care

## 2014-07-17 MED ORDER — MIDAZOLAM HCL 5 MG/5ML IJ SOLN
INTRAMUSCULAR | Status: DC | PRN
  Administered 2014-07-17: 2 mg via INTRAVENOUS
  Administered 2014-07-17 (×2): 1 mg via INTRAVENOUS

## 2014-07-17 MED ORDER — VANCOMYCIN 50 MG/ML ORAL SOLUTION: 125.0000 mg | Freq: Four times a day (QID) | ORAL | Status: DC

## 2014-07-17 MED ORDER — BUTAMBEN-TETRACAINE-BENZOCAINE 2-2-14 % EX AERO
1.0000 | INHALATION_SPRAY | Freq: Once | CUTANEOUS | Status: AC
  Administered 2014-07-17: 1 via TOPICAL
  Filled 2014-07-17: qty 56

## 2014-07-17 MED ORDER — PROPOFOL 10 MG/ML IV BOLUS
INTRAVENOUS | Status: AC
  Filled 2014-07-17: qty 20

## 2014-07-17 MED ORDER — LACTATED RINGERS IV SOLN
INTRAVENOUS | Status: DC
  Administered 2014-07-17: 12:00:00 via INTRAVENOUS

## 2014-07-17 MED ORDER — FENTANYL CITRATE 0.05 MG/ML IJ SOLN
INTRAMUSCULAR | Status: AC
  Filled 2014-07-17: qty 2

## 2014-07-17 MED ORDER — PROPOFOL INFUSION 10 MG/ML OPTIME
INTRAVENOUS | Status: DC | PRN
  Administered 2014-07-17: 65 ug/kg/min via INTRAVENOUS

## 2014-07-17 MED ORDER — FENTANYL CITRATE 0.05 MG/ML IJ SOLN
25.0000 ug | INTRAMUSCULAR | Status: AC
  Administered 2014-07-17 (×2): 25 ug via INTRAVENOUS

## 2014-07-17 MED ORDER — ONDANSETRON HCL 4 MG/2ML IJ SOLN
4.0000 mg | Freq: Once | INTRAMUSCULAR | Status: AC
  Administered 2014-07-17: 4 mg via INTRAVENOUS

## 2014-07-17 MED ORDER — GLYCOPYRROLATE 0.2 MG/ML IJ SOLN
0.2000 mg | Freq: Once | INTRAMUSCULAR | Status: AC
  Administered 2014-07-17: 0.2 mg via INTRAVENOUS

## 2014-07-17 MED ORDER — MIDAZOLAM HCL 2 MG/2ML IJ SOLN
INTRAMUSCULAR | Status: AC
  Filled 2014-07-17: qty 2

## 2014-07-17 MED ORDER — FENTANYL CITRATE 0.05 MG/ML IJ SOLN: 25.0000 ug | INTRAMUSCULAR | Status: DC | PRN

## 2014-07-17 MED ORDER — GLYCOPYRROLATE 0.2 MG/ML IJ SOLN
INTRAMUSCULAR | Status: AC
Start: 2014-07-17 — End: 2014-07-17
  Filled 2014-07-17: qty 1

## 2014-07-17 MED ORDER — ONDANSETRON HCL 4 MG/2ML IJ SOLN
INTRAMUSCULAR | Status: AC
  Filled 2014-07-17: qty 2

## 2014-07-17 MED ORDER — STERILE WATER FOR IRRIGATION IR SOLN
Status: DC | PRN
  Administered 2014-07-17: 13:00:00

## 2014-07-17 MED ORDER — ONDANSETRON HCL 4 MG/2ML IJ SOLN: 4.0000 mg | Freq: Once | INTRAMUSCULAR | Status: DC | PRN

## 2014-07-17 MED ORDER — OXYCODONE HCL ER 80 MG PO T12A: 80.0000 mg | EXTENDED_RELEASE_TABLET | Freq: Two times a day (BID) | ORAL | Status: DC

## 2014-07-17 MED ORDER — MIDAZOLAM HCL 2 MG/2ML IJ SOLN
1.0000 mg | INTRAMUSCULAR | Status: DC | PRN
  Administered 2014-07-17: 2 mg via INTRAVENOUS

## 2014-07-17 SURGICAL SUPPLY — 17 items
BLOCK BITE 60FR ADLT L/F BLUE (MISCELLANEOUS) ×3 IMPLANT
ELECT REM PT RETURN 9FT ADLT (ELECTROSURGICAL)
ELECTRODE REM PT RTRN 9FT ADLT (ELECTROSURGICAL) IMPLANT
FLOOR PAD 36X40 (MISCELLANEOUS) ×3
FORCEP COLD BIOPSY (CUTTING FORCEPS) ×2 IMPLANT
FORCEPS BIOP RAD 4 LRG CAP 4 (CUTTING FORCEPS) IMPLANT
MANIFOLD NEPTUNE II (INSTRUMENTS) ×2 IMPLANT
NDL SCLEROTHERAPY 25GX240 (NEEDLE) IMPLANT
NEEDLE SCLEROTHERAPY 25GX240 (NEEDLE) IMPLANT
PAD FLOOR 36X40 (MISCELLANEOUS) IMPLANT
PROBE APC STR FIRE (PROBE) IMPLANT
PROBE INJECTION GOLD (MISCELLANEOUS)
PROBE INJECTION GOLD 7FR (MISCELLANEOUS) IMPLANT
SNARE ROTATE MED OVAL 20MM (MISCELLANEOUS) IMPLANT
SYR 50ML LL SCALE MARK (SYRINGE) ×2 IMPLANT
TUBING IRRIGATION ENDOGATOR (MISCELLANEOUS) ×3 IMPLANT
WATER STERILE IRR 1000ML POUR (IV SOLUTION) ×2 IMPLANT

## 2014-07-17 NOTE — Progress Notes (Signed)
Awake. C/O dry throat. Swallowing without difficulty. Sprite given to drink. Tolerated well.

## 2014-07-17 NOTE — Progress Notes (Signed)
Patient with orders to be discharge home. Discharge instructions given, patient verbalized understanding. Patient is stable. Patient left with family in private vehicle.

## 2014-07-17 NOTE — Anesthesia Postprocedure Evaluation (Signed)
  Anesthesia Post-op Note  Patient: Deborah Russo  Procedure(s) Performed: Procedure(s): ESOPHAGOGASTRODUODENOSCOPY (EGD) (N/A) BIOPSY (N/A)  Patient Location: PACU  Anesthesia Type:MAC  Level of Consciousness: awake, alert , oriented and patient cooperative  Airway and Oxygen Therapy: Patient Spontanous Breathing  Post-op Pain: none  Post-op Assessment: Post-op Vital signs reviewed, Patient's Cardiovascular Status Stable, Respiratory Function Stable, Patent Airway and Pain level controlled  Post-op Vital Signs: Reviewed and stable  Last Vitals:  Filed Vitals:   07/17/14 1255  BP: 152/92  Pulse:   Temp:   Resp: 19    Complications: No apparent anesthesia complications

## 2014-07-17 NOTE — Discharge Summary (Signed)
Physician Discharge Summary  Deborah Russo:[id number] DOB: [birth year] DOA: 07/13/2014  PCP: No PCP Per Patient  Admit date: 07/13/2014 Discharge date: 07/17/2014  Time spent: 40 minutes  Recommendations for Outpatient Follow-up:  1. Dr Laural Golden will contact with biopsy results 2. PCP in 1 week for evaluation of symptom 3. Heart healthy diet  Discharge Diagnoses:  Principal Problem:   Diarrhea Active Problems:   Abdominal pain   Hypothyroidism   Hypokalemia   Chronic abdominal pain   H/O Clostridium difficile infection   FTT (failure to thrive) in adult   Abdominal pain, unspecified site   Discharge Condition: stable  Diet recommendation: heart healthy  Filed Weights   07/15/14 0500 07/16/14 0500 07/17/14 0500  Weight: 83 kg (182 lb 15.7 oz) 86.2 kg (190 lb 0.6 oz) 86.3 kg (190 lb 4.1 oz)    History of present illness:  53 yo female with h/o chronic abd pain, recent cdiff colitis was on flagyl??(pt unsure, big white pills) that she was still on per her report at presentation on 07/13/14 with persistent diarrhea, feeling weak and epigastric abd pain. She had been having epi abd pain for some time, and was supposed to have egd week prior but was canceled due to ble swelling. Denied fevers/chills. No n/v. Diarrhea nonbloody. This epigastric abd pain was new for her, her chronic abd pain is usually under left rib cage told this was due to nerve demage but fetus during pregnancy.  Hospital Course:  1. Reported diarrhea. Concern for possible recurrent C. difficile colitis but no further episode diarrhea after admission. BM with formed stool x2.  She remained afebrile and without leukocytosis. Continued oral vancomycin. EGD on 07/17/14 results below. Appreciate GI assistance. Biopsy per GI. GI will follow biopsy 2. Epigastric abdominal pain. Lipase was normal. On admission Alkaline phosphatase elevated at 7/25. Normal bilirubin. CT of the abdomen and pelvis 06/19/2014 negative for  changes of cirrhosis but did show fatty liver per GI. Pain much improved at discharge   3. Query alcohol abuse based on elevated AST the patient denies. No signs/sx withdrawal  4. Tobacco dependence. Cessation counseling offered 5. Chronic left rib cage pain. Stable. 6. Obstructive sleep apnea, refuses CPAP. 7. Hypothyroidism. Anemia 8. Macrocytic anemia  Remained stable.    Procedures:  EGD on 07/17/14. Impression:  No evidence of erosive reflux esophagitis.  Mild changes of portal gastropathy and antral gastritis no evidence of peptic ulcer disease.  Duodenogastric bile reflux.  Consultations:  Dr Laural Golden gastroenterology  Discharge Exam: Filed Vitals:   07/17/14 1505  BP: 186/85  Pulse:   Temp: 98.2 F (36.8 C)  Resp: 16    General: well nourished. Appears comfortable Cardiovascular: RRR No MGR no LE edema Respiratory: normal effort BS clear bilaterally no wheeze no rhonchi Abdomen. Soft +BS throughout. Only mild tenderness to palpation in epigastric area  Discharge Instructions You were cared for by a hospitalist during your hospital stay. If you have any questions about your discharge medications or the care you received while you were in the hospital after you are discharged, you can call the unit and asked to speak with the hospitalist on call if the hospitalist that took care of you is not available. Once you are discharged, your primary care physician will handle any further medical issues. Please note that NO REFILLS for any discharge medications will be authorized once you are discharged, as it is imperative that you return to your primary care physician (or establish a relationship with  a primary care physician if you do not have one) for your aftercare needs so that they can reassess your need for medications and monitor your lab values.      Discharge Instructions   Diet - low sodium heart healthy    Complete by:  As directed      Increase activity slowly     Complete by:  As directed             Medication List    STOP taking these medications       doxycycline 100 MG capsule  Commonly known as:  VIBRAMYCIN     loperamide 2 MG capsule  Commonly known as:  IMODIUM     metroNIDAZOLE 500 MG tablet  Commonly known as:  FLAGYL      TAKE these medications       cyanocobalamin 500 MCG tablet  Take 1 tablet (500 mcg total) by mouth daily.     diazepam 5 MG tablet  Commonly known as:  VALIUM  Take 5 mg by mouth 3 (three) times daily.     diphenhydrAMINE 25 mg capsule  Commonly known as:  BENADRYL  Take 25 mg by mouth at bedtime as needed for allergies.     levothyroxine 175 MCG tablet  Commonly known as:  SYNTHROID, LEVOTHROID  Take 1 tablet (175 mcg total) by mouth daily before breakfast.     ondansetron 4 MG tablet  Commonly known as:  ZOFRAN  Take 1 tablet (4 mg total) by mouth every 6 (six) hours as needed for nausea.     OxyCODONE 80 mg T12a 12 hr tablet  Commonly known as:  OXYCONTIN  Take 1 tablet (80 mg total) by mouth every 12 (twelve) hours.     saccharomyces boulardii 250 MG capsule  Commonly known as:  FLORASTOR  Take 1 capsule (250 mg total) by mouth 2 (two) times daily.     SUMAtriptan 100 MG tablet  Commonly known as:  IMITREX  Take 100 mg by mouth every 2 (two) hours as needed for migraine.     vancomycin 50 mg/mL oral solution  Commonly known as:  VANCOCIN  Take 2.5 mLs (125 mg total) by mouth every 6 (six) hours.       Allergies  Allergen Reactions  . Penicillins Hives  . Sulfa Antibiotics Hives   Follow-up Information   Follow up with REHMAN,NAJEEB U, MD. (office will contact you regarding biopsy and appointment for follow up. )    Specialty:  Gastroenterology   Contact information:   [place], [place] [address] [place] [address] [phone]        The results of significant diagnostics from this hospitalization (including imaging, microbiology, ancillary and laboratory) are listed  below for reference.    Significant Diagnostic Studies: Dg Chest 2 View  06/19/2014   CLINICAL DATA:  Weakness with nausea and vomiting.  EXAM: CHEST  2 VIEW  COMPARISON:  04/08/2014.  FINDINGS: Lungs are hyperexpanded. No edema or focal airspace consolidation. The cardio pericardial silhouette is enlarged. Telemetry leads overlie the chest. Imaged bony structures of the thorax are intact.  IMPRESSION: Stable.  No acute cardiopulmonary findings.   Electronically Signed   By: Misty Stanley M.D.   On: 06/19/2014 17:14   Ct Abdomen Pelvis W Contrast  06/19/2014   CLINICAL DATA:  Weakness, abdominal pain, vomiting and diarrhea for 7 months, status post hysterectomy and cholecystectomy.  EXAM: CT ABDOMEN AND PELVIS WITH CONTRAST  TECHNIQUE: Multidetector  CT imaging of the abdomen and pelvis was performed using the standard protocol following bolus administration of intravenous contrast.  CONTRAST:  133m OMNIPAQUE IOHEXOL 300 MG/ML SOLN, 543mOMNIPAQUE IOHEXOL 300 MG/ML SOLN  COMPARISON:  09/19/2013  FINDINGS: Sagittal images of the spine are unremarkable. Atherosclerotic calcification and plaques are noted abdominal aorta and iliac arteries. No aortic aneurysm. There is hepatic fatty infiltration. The patient is status postcholecystectomy. Small pneumobilia probable postcholecystectomy in left hepatic lobe. The pancreas, spleen and adrenal glands are unremarkable. Kidneys are symmetrical in size and enhancement. No hydronephrosis or hydroureter. There is nonobstructive calcified calculus in midpole of the right kidney measures 3 mm. No calcified ureteral calculi are noted.  There is no pericecal inflammation. There is nonspecific mild thickening of the right colonic wall with fatty appearance. This is best visualized in coronal image 44. Nonspecific mild colitis or inflammatory bowel disease cannot be excluded. Clinical correlation is necessary.  No small bowel or colonic obstruction  No distal colonic obstruction.  The patient is status post hysterectomy. No pelvic ascites or adenopathy. No destructive bony lesions are noted within pelvis.  Delayed renal images shows bilateral renal symmetrical excretion.  IMPRESSION: 1. Fatty infiltration of the liver. Status post cholecystectomy. Mild pneumobilia in left hepatic lobe. 2. No hydronephrosis or hydroureter. 3. Nonspecific mild fatty thickening of right colonic wall. This is best visualized in coronal image 44. Nonspecific mild colitis cannot be excluded. Clinical correlation is necessary. 4. No pericecal inflammation. No small bowel or colonic obstruction. 5. Status post hysterectomy.   Electronically Signed   By: LiLahoma Crocker.D.   On: 06/19/2014 17:08   Us[place]oreaenous Img Lower Unilateral Right  07/01/2014   CLINICAL DATA:  Right lower extremity pain and edema.  Recent fall.  EXAM: RIGHT LOWER EXTREMITY VENOUS DOPPLER ULTRASOUND  TECHNIQUE: Gray-scale sonography with graded compression, as well as color Doppler and duplex ultrasound were performed to evaluate the lower extremity deep venous systems from the level of the common femoral vein and including the common femoral, femoral, profunda femoral, popliteal and calf veins including the posterior tibial, peroneal and gastrocnemius veins when visible. The superficial great saphenous vein was also interrogated. Spectral Doppler was utilized to evaluate flow at rest and with distal augmentation maneuvers in the common femoral, femoral and popliteal veins.  COMPARISON:  None.  FINDINGS: Common Femoral Vein: No evidence of thrombus. Normal compressibility, respiratory phasicity and response to augmentation.  Saphenofemoral Junction: No evidence of thrombus. Normal compressibility and flow on color Doppler imaging.  Profunda Femoral Vein: No evidence of thrombus. Normal compressibility and flow on color Doppler imaging.  Femoral Vein: No evidence of thrombus. Normal compressibility, respiratory phasicity and response to augmentation.   Popliteal Vein: No evidence of thrombus. Normal compressibility, respiratory phasicity and response to augmentation.  Calf Veins: No evidence of thrombus. Normal compressibility and flow on color Doppler imaging.  Superficial Great Saphenous Vein: No evidence of thrombus. Normal compressibility and flow on color Doppler imaging.  Venous Reflux:  None.  Other Findings: No evidence of superficial thrombophlebitis or abnormal fluid collection.  IMPRESSION: No evidence of right lower extremity deep venous thrombosis.   Electronically Signed   By: GlAletta Edouard.D.   On: 07/01/2014 09:20   Dg Abd Acute W/chest  07/13/2014   CLINICAL DATA:  Pain and rectal bleeding  EXAM: ACUTE ABDOMEN SERIES (ABDOMEN 2 VIEW & CHEST 1 VIEW)  COMPARISON:  Chest radiograph June 19, 2014; CT abdomen and pelvis July 09, 2014  FINDINGS: PA chest: No edema or consolidation. Heart is borderline enlarged with normal pulmonary vascularity. No adenopathy.  Supine and upright abdomen: There is stool throughout the colon, moderate. Bowel gas pattern is unremarkable. No obstruction or free air. There is vascular calcification in the pelvis. There are surgical clips in the right upper quadrant.  IMPRESSION: Moderate stool throughout colon. Bowel gas pattern unremarkable. No edema or consolidation. Heart prominent but stable.   Electronically Signed   By: Lowella Grip M.D.   On: 07/13/2014 16:35    Microbiology: Recent Results (from the past 240 hour(s))  STOOL CULTURE     Status: None   Collection Time    07/14/14 10:00 PM      Result Value Ref Range Status   Specimen Description STOOL   Final   Special Requests NONE   Final   Culture     Final   Value: NO SUSPICIOUS COLONIES, CONTINUING TO HOLD     Performed at [facility]   Report Status PENDING   Incomplete     Labs: Basic Metabolic Panel:  Recent Labs Lab 07/13/14 1544 07/14/14 0610 07/17/14 0619  NA 138 139 142  K 3.2* 3.8 3.8  CL 98 101 102  CO2 30  28 30   GLUCOSE 140* 130* 113*  BUN 4* 5* 8  CREATININE 0.59 0.63 0.79  CALCIUM 9.0 8.6 9.2   Liver Function Tests:  Recent Labs Lab 07/13/14 1544  AST 14  ALT 5  ALKPHOS 129*  BILITOT 0.3  PROT 7.1  ALBUMIN 2.8*    Recent Labs Lab 07/13/14 1544  LIPASE 22   No results found for this basename: AMMONIA,  in the last 168 hours CBC:  Recent Labs Lab 07/13/14 1544 07/14/14 0610 07/17/14 0619  WBC 8.5 6.5 5.1  NEUTROABS 6.0  --   --   HGB 12.1 11.1* 11.5*  HCT 38.5 36.2 36.9  MCV 100.8* 103.1* 101.7*  PLT 281 261 239   Cardiac Enzymes: No results found for this basename: CKTOTAL, CKMB, CKMBINDEX, TROPONINI,  in the last 168 hours BNP: BNP (last 3 results)  Recent Labs  04/08/14 0955  PROBNP 106.1   CBG: No results found for this basename: GLUCAP,  in the last 168 hours     Signed:  Radene Gunning  [facility] 07/17/2014, 3:57 PM   I have seen this patient independently, discussed POC with MID-level provider and patient and ammended note and plan where needed  EOmi No abd pain Chest clear No le edema No rash   -patient needs out-patient Interventional pain management -Have advised celiac plexus block -given limited Rx Oxycontin 8-10 tabs -complete Vancomycin 10 more days orally   Verneita Griffes, MD [facility] [phone]

## 2014-07-17 NOTE — Anesthesia Preprocedure Evaluation (Signed)
Anesthesia Evaluation  Patient identified by MRN, date of birth, ID band Patient awake    Reviewed: Allergy & Precautions, H&P , NPO status , Patient's Chart, lab work & pertinent test results  Airway Mallampati: II TM Distance: >3 FB     Dental  (+) Poor Dentition, Chipped, Loose, Missing, Dental Advisory Given   Pulmonary sleep apnea , Current Smoker,  breath sounds clear to auscultation        Cardiovascular negative cardio ROS  Rhythm:Regular Rate:Normal     Neuro/Psych  Headaches, Chronic pain syndrome     GI/Hepatic negative GI ROS,   Endo/Other  Hypothyroidism   Renal/GU      Musculoskeletal   Abdominal   Peds  Hematology  (+) anemia ,   Anesthesia Other Findings   Reproductive/Obstetrics                           Anesthesia Physical Anesthesia Plan  ASA: III  Anesthesia Plan: MAC   Post-op Pain Management:    Induction: Intravenous  Airway Management Planned: Simple Face Mask  Additional Equipment:   Intra-op Plan:   Post-operative Plan:   Informed Consent: I have reviewed the patients History and Physical, chart, labs and discussed the procedure including the risks, benefits and alternatives for the proposed anesthesia with the patient or authorized representative who has indicated his/her understanding and acceptance.     Plan Discussed with:   Anesthesia Plan Comments:         Anesthesia Quick Evaluation

## 2014-07-17 NOTE — Progress Notes (Signed)
Ready for d/c to room. Waiting on christina to take report.

## 2014-07-17 NOTE — Transfer of Care (Signed)
Immediate Anesthesia Transfer of Care Note  Patient: Deborah Russo  Procedure(s) Performed: Procedure(s): ESOPHAGOGASTRODUODENOSCOPY (EGD) (N/A) BIOPSY (N/A)  Patient Location: PACU  Anesthesia Type:MAC  Level of Consciousness: awake, alert  and patient cooperative  Airway & Oxygen Therapy: Patient Spontanous Breathing and Patient connected to face mask oxygen  Post-op Assessment: Report given to PACU RN, Post -op Vital signs reviewed and stable and Patient moving all extremities  Post vital signs: Reviewed and stable  Complications: No apparent anesthesia complications

## 2014-07-17 NOTE — Op Note (Signed)
EGD PROCEDURE REPORT  PATIENT:  Deborah Russo  MR#:  [id number] Birthdate:  [birth year], 53 y.o., female Endoscopist:  Dr. Rogene Houston, MD Referred By:  Dr. Murray Hodgkins, MD Procedure Date: 07/17/2014  Procedure:   EGD  Indications:  Patient is 53 year old Caucasian female with acute on chronic epigastric pain. Patient was admitted over the weekend but recurrent C. difficile colitis. She is better with therapy. She is undergoing diagnostic EGD with propofol. His procedure was scheduled on an outpatient basis about 2 weeks ago but had to cancel because of asymmetric lower extremity edema and DVT ruled out.            Informed Consent:  The risks, benefits, alternatives & imponderables which include, but are not limited to, bleeding, infection, perforation, drug reaction and potential missed lesion have been reviewed.  The potential for biopsy, lesion removal, esophageal dilation, etc. have also been discussed.  Questions have been answered.  All parties agreeable.  Please see history & physical in medical record for more information.  Medications:  Cetacaine spray topically for oropharyngeal anesthesia Monitored anesthesia care.  Description of procedure:  The endoscope was introduced through the mouth and advanced to the second portion of the duodenum without difficulty or limitations. The mucosal surfaces were surveyed very carefully during advancement of the scope and upon withdrawal.  Findings:  Esophagus:  Mucosa of the esophagus was normal. No varices are identified. GE junction was unremarkable. GEJ:  38 cm Stomach:  Moderate amount of bile noted in the stomach. Stomach distended well with insufflation. Mucosa and gastric body reveals a mosaic pattern. Patchy erythema noted at antrum. No erosions or ulcers noted. Angularis fundus and cardia was unremarkable. Duodenum:  Normal bulbar and postbulbar mucosa.  Therapeutic/Diagnostic Maneuvers Performed:   Antral biopsy taken  for routine histology.  Complications:  None  Impression: No evidence of erosive reflux esophagitis. Mild changes of portal gastropathy and antral gastritis no evidence of peptic ulcer disease. Duodenogastric bile reflux.  Comment; Recent CT negative for changes of cirrhosis but did show fatty liver.  Recommendations:  Resume usual diet. Continue po vancomycin  for 10 days. I will be contacting patient's sister with biopsy results.  Skeeter Sheard U  07/17/2014  1:33 PM  CC: Dr. Rayne Du PCP Per Patient & Dr. No ref. provider found

## 2014-07-17 NOTE — Progress Notes (Signed)
Called christina. Report given.

## 2014-07-18 NOTE — Progress Notes (Signed)
UR chart review completed.  

## 2014-07-19 ENCOUNTER — Encounter (HOSPITAL_COMMUNITY): Payer: Self-pay | Admitting: Internal Medicine

## 2014-07-19 LAB — STOOL CULTURE

## 2014-07-25 DIAGNOSIS — D649 Anemia, unspecified: Secondary | ICD-10-CM | POA: Diagnosis not present

## 2014-07-25 DIAGNOSIS — E039 Hypothyroidism, unspecified: Secondary | ICD-10-CM | POA: Diagnosis not present

## 2014-07-25 DIAGNOSIS — A0472 Enterocolitis due to Clostridium difficile, not specified as recurrent: Secondary | ICD-10-CM | POA: Diagnosis not present

## 2014-07-25 DIAGNOSIS — G8929 Other chronic pain: Secondary | ICD-10-CM | POA: Diagnosis not present

## 2014-08-15 ENCOUNTER — Encounter (HOSPITAL_COMMUNITY): Payer: Self-pay | Admitting: Emergency Medicine

## 2014-08-15 ENCOUNTER — Emergency Department (HOSPITAL_COMMUNITY)
Admission: EM | Admit: 2014-08-15 | Discharge: 2014-08-15 | Disposition: A | Discharge status: Home / Self Care | Payer: Medicare Other | Attending: Emergency Medicine | Admitting: Emergency Medicine

## 2014-08-15 ENCOUNTER — Telehealth (INDEPENDENT_AMBULATORY_CARE_PROVIDER_SITE_OTHER): Payer: Self-pay | Admitting: *Deleted

## 2014-08-15 DIAGNOSIS — G4730 Sleep apnea, unspecified: Secondary | ICD-10-CM | POA: Insufficient documentation

## 2014-08-15 DIAGNOSIS — Z880 Allergy status to penicillin: Secondary | ICD-10-CM | POA: Insufficient documentation

## 2014-08-15 DIAGNOSIS — R112 Nausea with vomiting, unspecified: Secondary | ICD-10-CM | POA: Insufficient documentation

## 2014-08-15 DIAGNOSIS — Z9981 Dependence on supplemental oxygen: Secondary | ICD-10-CM | POA: Insufficient documentation

## 2014-08-15 DIAGNOSIS — R109 Unspecified abdominal pain: Secondary | ICD-10-CM | POA: Diagnosis not present

## 2014-08-15 DIAGNOSIS — E039 Hypothyroidism, unspecified: Secondary | ICD-10-CM | POA: Diagnosis not present

## 2014-08-15 DIAGNOSIS — F17200 Nicotine dependence, unspecified, uncomplicated: Secondary | ICD-10-CM | POA: Diagnosis not present

## 2014-08-15 DIAGNOSIS — R197 Diarrhea, unspecified: Secondary | ICD-10-CM | POA: Diagnosis not present

## 2014-08-15 DIAGNOSIS — Z79899 Other long term (current) drug therapy: Secondary | ICD-10-CM | POA: Diagnosis not present

## 2014-08-15 DIAGNOSIS — G43709 Chronic migraine without aura, not intractable, without status migrainosus: Secondary | ICD-10-CM | POA: Insufficient documentation

## 2014-08-15 DIAGNOSIS — Z792 Long term (current) use of antibiotics: Secondary | ICD-10-CM | POA: Diagnosis not present

## 2014-08-15 DIAGNOSIS — G8929 Other chronic pain: Secondary | ICD-10-CM | POA: Diagnosis not present

## 2014-08-15 LAB — CBC WITH DIFFERENTIAL/PLATELET
Basophils Absolute: 0 10*3/uL (ref 0.0–0.1)
Basophils Relative: 1 % (ref 0–1)
EOS ABS: 0.1 10*3/uL (ref 0.0–0.7)
EOS PCT: 1 % (ref 0–5)
HCT: 45.1 % (ref 36.0–46.0)
HEMOGLOBIN: 15 g/dL (ref 12.0–15.0)
Lymphocytes Relative: 40 % (ref 12–46)
Lymphs Abs: 2.8 10*3/uL (ref 0.7–4.0)
MCH: 31.1 pg (ref 26.0–34.0)
MCHC: 33.3 g/dL (ref 30.0–36.0)
MCV: 93.6 fL (ref 78.0–100.0)
MONO ABS: 0.3 10*3/uL (ref 0.1–1.0)
Monocytes Relative: 4 % (ref 3–12)
Neutro Abs: 3.9 10*3/uL (ref 1.7–7.7)
Neutrophils Relative %: 54 % (ref 43–77)
Platelets: 163 10*3/uL (ref 150–400)
RBC: 4.82 MIL/uL (ref 3.87–5.11)
RDW: 16 % — ABNORMAL HIGH (ref 11.5–15.5)
WBC: 7.1 10*3/uL (ref 4.0–10.5)

## 2014-08-15 LAB — COMPREHENSIVE METABOLIC PANEL
ALT: 20 U/L (ref 0–35)
ANION GAP: 13 (ref 5–15)
AST: 32 U/L (ref 0–37)
Albumin: 4.1 g/dL (ref 3.5–5.2)
Alkaline Phosphatase: 127 U/L — ABNORMAL HIGH (ref 39–117)
BUN: 6 mg/dL (ref 6–23)
CALCIUM: 10.3 mg/dL (ref 8.4–10.5)
CO2: 29 mEq/L (ref 19–32)
Chloride: 95 mEq/L — ABNORMAL LOW (ref 96–112)
Creatinine, Ser: 0.71 mg/dL (ref 0.50–1.10)
GFR calc Af Amer: >90 mL/min (ref >90)
GFR calc non Af Amer: >90 mL/min (ref >90)
GLUCOSE: 127 mg/dL — AB (ref 70–99)
Potassium: 4.3 mEq/L (ref 3.7–5.3)
Sodium: 137 mEq/L (ref 137–147)
TOTAL PROTEIN: 8.4 g/dL — AB (ref 6.0–8.3)
Total Bilirubin: 0.4 mg/dL (ref 0.3–1.2)

## 2014-08-15 LAB — URINALYSIS, ROUTINE W REFLEX MICROSCOPIC
BILIRUBIN URINE: NEGATIVE
Glucose, UA: NEGATIVE mg/dL
HGB URINE DIPSTICK: NEGATIVE
Ketones, ur: NEGATIVE mg/dL
Leukocytes, UA: NEGATIVE
Nitrite: NEGATIVE
Protein, ur: NEGATIVE mg/dL
Specific Gravity, Urine: 1.01 (ref 1.005–1.030)
UROBILINOGEN UA: 0.2 mg/dL (ref 0.0–1.0)
pH: 6 (ref 5.0–8.0)

## 2014-08-15 LAB — LIPASE, BLOOD: Lipase: 18 U/L (ref 11–59)

## 2014-08-15 MED ORDER — ONDANSETRON HCL 4 MG PO TABS: 4.0000 mg | ORAL_TABLET | Freq: Three times a day (TID) | ORAL | Status: DC | PRN

## 2014-08-15 MED ORDER — SODIUM CHLORIDE 0.9 % IV BOLUS (SEPSIS)
1000.0000 mL | Freq: Once | INTRAVENOUS | Status: AC
  Administered 2014-08-15: 1000 mL via INTRAVENOUS

## 2014-08-15 MED ORDER — HYDROMORPHONE HCL PF 1 MG/ML IJ SOLN
1.0000 mg | Freq: Once | INTRAMUSCULAR | Status: AC
  Administered 2014-08-15: 1 mg via INTRAVENOUS
  Filled 2014-08-15: qty 1

## 2014-08-15 MED ORDER — HYDROMORPHONE HCL PF 1 MG/ML IJ SOLN
0.5000 mg | Freq: Once | INTRAMUSCULAR | Status: AC
  Administered 2014-08-15: 0.5 mg via INTRAVENOUS
  Filled 2014-08-15: qty 1

## 2014-08-15 MED ORDER — ONDANSETRON HCL 4 MG/2ML IJ SOLN
4.0000 mg | Freq: Once | INTRAMUSCULAR | Status: AC
  Administered 2014-08-15: 4 mg via INTRAVENOUS
  Filled 2014-08-15: qty 2

## 2014-08-15 NOTE — ED Provider Notes (Signed)
This chart was scribed for [name], DO by Terressa Koyanagi, ED Scribe. This patient was seen in room APA10/APA10 and the patient's care was started at 9:37 PM.  TIME SEEN: 9:37 PM  CHIEF COMPLAINT:  Chief Complaint  Patient presents with  . Nausea  . Emesis  . Abdominal Pain   HPI:  PCP: Deborah Fire, MD HPI Comments: Deborah Russo is a 53 y.o. female, with an extensive medical Hx significant for chronic pain (including chronic abd pain for the past 25 years who is on a pain contract at [facility]), chronic n/v/d, tobacco use, and sleep apnea, who presents to the Emergency Department complaining of intermittent, worsening abd pain with associated n/v/d onset one week ago. Pt states that this feels worse than her chronic pain. Pt describes her present abd pain as a stabbing pain and rates it a 9 out of 10. Pt reports she took her last 80 mg of oxycodone at 5AM; and cannot get a refill of her pain med until Monday. Denies any fevers, chills, placed or melena, vaginal bleeding or discharge, dysuria or hematuria. She is status post cholecystectomy, hysterectomy, C-section.   GI: Pt states she is being followed by Dr. Corbin Ade and has an upcoming colonoscopy.     ROS: See HPI Constitutional: no fever  Eyes: no drainage  ENT: no runny nose   Cardiovascular:  no chest pain  Resp: no SOB  GI: vomiting GU: no dysuria Integumentary: no rash  Allergy: no hives  Musculoskeletal: no leg swelling  Neurological: no slurred speech ROS otherwise negative  PAST MEDICAL HISTORY/PAST SURGICAL HISTORY:  Past Medical History  Diagnosis Date  . Chronic pain   . Chronic migraine   . Chronic abdominal pain     since approximately 1991  . Nausea, vomiting, and diarrhea     recurrent, chronic  . Pain management   . Hypothyroidism   . Sleep apnea     has CPAP, but doesn't wear it bc she says "i can't sleep with it on"    MEDICATIONS:  Prior to Admission medications   Medication Sig  Start Date End Date Taking? Authorizing Provider  cyanocobalamin 500 MCG tablet Take 1 tablet (500 mcg total) by mouth daily. 06/22/14   Maryann Mikhail, DO  diazepam (VALIUM) 5 MG tablet Take 5 mg by mouth 3 (three) times daily.    Historical Provider, MD  diphenhydrAMINE (BENADRYL) 25 mg capsule Take 25 mg by mouth at bedtime as needed for allergies.    Historical Provider, MD  levothyroxine (SYNTHROID, LEVOTHROID) 175 MCG tablet Take 1 tablet (175 mcg total) by mouth daily before breakfast. 04/08/14   Maudry Diego, MD  ondansetron (ZOFRAN) 4 MG tablet Take 1 tablet (4 mg total) by mouth every 6 (six) hours as needed for nausea. 06/22/14   Maryann Mikhail, DO  OxyCODONE (OXYCONTIN) 80 mg T12A 12 hr tablet Take 1 tablet (80 mg total) by mouth every 12 (twelve) hours. 07/17/14   Nita Sells, MD  saccharomyces boulardii (FLORASTOR) 250 MG capsule Take 1 capsule (250 mg total) by mouth 2 (two) times daily. 06/22/14   Maryann Mikhail, DO  SUMAtriptan (IMITREX) 100 MG tablet Take 100 mg by mouth every 2 (two) hours as needed for migraine.    Historical Provider, MD  vancomycin (VANCOCIN) 50 mg/mL oral solution Take 2.5 mLs (125 mg total) by mouth every 6 (six) hours. 07/17/14   Nita Sells, MD    ALLERGIES:  Allergies  Allergen Reactions  .  Penicillins Hives  . Sulfa Antibiotics Hives    SOCIAL HISTORY:  History  Substance Use Topics  . Smoking status: Current Every Day Smoker -- 0.50 packs/day for 15 years    Types: Cigarettes  . Smokeless tobacco: Never Used  . Alcohol Use: No    FAMILY HISTORY: Family History  Problem Relation Age of Onset  . Asthma Mother   . Cancer Father     EXAM: Triage Vitals: BP 193/124  Pulse 92  Temp(Src) 98.8 F (37.1 C) (Oral)  Resp 22  Ht 5' 2"  (1.575 m)  Wt 183 lb (83.008 kg)  BMI 33.46 kg/m2  SpO2 99%  CONSTITUTIONAL: Alert and oriented and responds appropriately to questions. Well-appearing; well-nourished HEAD:  Normocephalic EYES: Conjunctivae clear, PERRL ENT: normal nose; no rhinorrhea; moist mucous membranes; pharynx without lesions noted NECK: Supple, no meningismus, no LAD  CARD: RRR; S1 and S2 appreciated; no murmurs, no clicks, no rubs, no gallops RESP: Normal chest excursion without splinting or tachypnea; breath sounds clear and equal bilaterally; no wheezes, no rhonchi, no rales,  ABD/GI: Normal bowel sounds; non-distended; soft, abdomen is completely nontender to palpation when she is distracted, there is no guarding or rebound, no peritoneal signs BACK:  The back appears normal and is non-tender to palpation, there is no CVA tenderness EXT: Normal ROM in all joints; non-tender to palpation; no edema; normal capillary refill; no cyanosis    SKIN: Normal color for age and race; warm NEURO: Moves all extremities equally PSYCH: The patient's mood and manner are appropriate. Grooming and personal hygiene are appropriate.    MEDICAL DECISION MAKING: Patient here with an acute exacerbation of her chronic pain with vomiting and diarrhea. Her abdominal exam when distracted is completely benign. She is hypertensive but this may be secondary to being out of her narcotic pain medication. We'll treat her pain in the emergency department and check labs, urine. Do not feel she needs imaging at this time.  ED PROGRESS: Labs are unremarkable. Urine shows no sign of infection. No further vomiting or diarrhea in the ED. She is able to tolerate by mouth. Her blood pressure is improved it is 173/92. Discuss with patient I feel she is safe to be discharged. Have also discussed with her that we will not be discharging her with narcotic pain medication and she is to followup with her PCP and pain clinic for this. She verbalizes understanding and is comfortable with plan. Discussed supportive care instructions and return precautions.   I personally performed the services described in this documentation, which was  scribed in my presence. The recorded information has been reviewed and is accurate.    [place], DO 08/16/14 [phone]

## 2014-08-15 NOTE — ED Notes (Signed)
Having n/v  For over one week.  Having abdominal pain, rates pain 9.  Took 80 mg oxycodone at 5 am and pt is out of medication and can not be refilled until Monday.

## 2014-08-15 NOTE — Discharge Instructions (Signed)
Abdominal Pain Many things can cause abdominal pain. Usually, abdominal pain is not caused by a disease and will improve without treatment. It can often be observed and treated at home. Your health care provider will do a physical exam and possibly order blood tests and X-rays to help determine the seriousness of your pain. However, in many cases, more time must pass before a clear cause of the pain can be found. Before that point, your health care provider may not know if you need more testing or further treatment. HOME CARE INSTRUCTIONS  Monitor your abdominal pain for any changes. The following actions may help to alleviate any discomfort you are experiencing:  Only take over-the-counter or prescription medicines as directed by your health care provider.  Do not take laxatives unless directed to do so by your health care provider.  Try a clear liquid diet (broth, tea, or water) as directed by your health care provider. Slowly move to a bland diet as tolerated. SEEK MEDICAL CARE IF:  You have unexplained abdominal pain.  You have abdominal pain associated with nausea or diarrhea.  You have pain when you urinate or have a bowel movement.  You experience abdominal pain that wakes you in the night.  You have abdominal pain that is worsened or improved by eating food.  You have abdominal pain that is worsened with eating fatty foods.  You have a fever. SEEK IMMEDIATE MEDICAL CARE IF:   Your pain does not go away within 2 hours.  You keep throwing up (vomiting).  Your pain is felt only in portions of the abdomen, such as the right side or the left lower portion of the abdomen.  You pass bloody or black tarry stools. MAKE SURE YOU:  Understand these instructions.   Will watch your condition.   Will get help right away if you are not doing well or get worse.  Document Released: 09/15/2005 Document Revised: 12/11/2013 Document Reviewed: 08/15/2013 [facility] Patient Information  2015 [place], [place]. This information is not intended to replace advice given to you by your health care provider. Make sure you discuss any questions you have with your health care provider.  Chronic Pain Chronic pain can be defined as pain that is off and on and lasts for 3-6 months or longer. Many things cause chronic pain, which can make it difficult to make a diagnosis. There are many treatment options available for chronic pain. However, finding a treatment that works well for you may require trying various approaches until the right one is found. Many people benefit from a combination of two or more types of treatment to control their pain. SYMPTOMS  Chronic pain can occur anywhere in the body and can range from mild to very severe. Some types of chronic pain include:  Headache.  Low back pain.  Cancer pain.  Arthritis pain.  Neurogenic pain. This is pain resulting from damage to nerves. People with chronic pain may also have other symptoms such as:  Depression.  Anger.  Insomnia.  Anxiety. DIAGNOSIS  Your health care provider will help diagnose your condition over time. In many cases, the initial focus will be on excluding possible conditions that could be causing the pain. Depending on your symptoms, your health care provider may order tests to diagnose your condition. Some of these tests may include:   Blood tests.   CT scan.   MRI.   X-rays.   Ultrasounds.   Nerve conduction studies.  You may need to see a specialist.  TREATMENT  Finding treatment that works well may take time. You may be referred to a pain specialist. He or she may prescribe medicine or therapies, such as:   Mindful meditation or yoga.  Shots (injections) of numbing or pain-relieving medicines into the spine or area of pain.  Local electrical stimulation.  Acupuncture.   Massage therapy.   Aroma, color, light, or sound therapy.   Biofeedback.   Working with a physical  therapist to keep from getting stiff.   Regular, gentle exercise.   Cognitive or behavioral therapy.   Group support.  Sometimes, surgery may be recommended.  HOME CARE INSTRUCTIONS   Take all medicines as directed by your health care provider.   Lessen stress in your life by relaxing and doing things such as listening to calming music.   Exercise or be active as directed by your health care provider.   Eat a healthy diet and include things such as vegetables, fruits, fish, and lean meats in your diet.   Keep all follow-up appointments with your health care provider.   Attend a support group with others suffering from chronic pain. SEEK MEDICAL CARE IF:   Your pain gets worse.   You develop a new pain that was not there before.   You cannot tolerate medicines given to you by your health care provider.   You have new symptoms since your last visit with your health care provider.  SEEK IMMEDIATE MEDICAL CARE IF:   You feel weak.   You have decreased sensation or numbness.   You lose control of bowel or bladder function.   Your pain suddenly gets much worse.   You develop shaking.  You develop chills.  You develop confusion.  You develop chest pain.  You develop shortness of breath.  MAKE SURE YOU:  Understand these instructions.  Will watch your condition.  Will get help right away if you are not doing well or get worse. Document Released: 08/28/2002 Document Revised: 08/08/2013 Document Reviewed: 06/01/2013 [facility] Patient Information 2015 [place], [place]. This information is not intended to replace advice given to you by your health care provider. Make sure you discuss any questions you have with your health care provider.  Nausea and Vomiting Nausea is a sick feeling that often comes before throwing up (vomiting). Vomiting is a reflex where stomach contents come out of your mouth. Vomiting can cause severe loss of body fluids  (dehydration). Children and elderly adults can become dehydrated quickly, especially if they also have diarrhea. Nausea and vomiting are symptoms of a condition or disease. It is important to find the cause of your symptoms. CAUSES   Direct irritation of the stomach lining. This irritation can result from increased acid production (gastroesophageal reflux disease), infection, food poisoning, taking certain medicines (such as nonsteroidal anti-inflammatory drugs), alcohol use, or tobacco use.  Signals from the brain.These signals could be caused by a headache, heat exposure, an inner ear disturbance, increased pressure in the brain from injury, infection, a tumor, or a concussion, pain, emotional stimulus, or metabolic problems.  An obstruction in the gastrointestinal tract (bowel obstruction).  Illnesses such as diabetes, hepatitis, gallbladder problems, appendicitis, kidney problems, cancer, sepsis, atypical symptoms of a heart attack, or eating disorders.  Medical treatments such as chemotherapy and radiation.  Receiving medicine that makes you sleep (general anesthetic) during surgery. DIAGNOSIS Your caregiver may ask for tests to be done if the problems do not improve after a few days. Tests may also be done if symptoms  are severe or if the reason for the nausea and vomiting is not clear. Tests may include:  Urine tests.  Blood tests.  Stool tests.  Cultures (to look for evidence of infection).  X-rays or other imaging studies. Test results can help your caregiver make decisions about treatment or the need for additional tests. TREATMENT You need to stay well hydrated. Drink frequently but in small amounts.You may wish to drink water, sports drinks, clear broth, or eat frozen ice pops or gelatin dessert to help stay hydrated.When you eat, eating slowly may help prevent nausea.There are also some antinausea medicines that may help prevent nausea. HOME CARE INSTRUCTIONS   Take  all medicine as directed by your caregiver.  If you do not have an appetite, do not force yourself to eat. However, you must continue to drink fluids.  If you have an appetite, eat a normal diet unless your caregiver tells you differently.  Eat a variety of complex carbohydrates (rice, wheat, potatoes, bread), lean meats, yogurt, fruits, and vegetables.  Avoid high-fat foods because they are more difficult to digest.  Drink enough water and fluids to keep your urine clear or pale yellow.  If you are dehydrated, ask your caregiver for specific rehydration instructions. Signs of dehydration may include:  Severe thirst.  Dry lips and mouth.  Dizziness.  Dark urine.  Decreasing urine frequency and amount.  Confusion.  Rapid breathing or pulse. SEEK IMMEDIATE MEDICAL CARE IF:   You have blood or brown flecks (like coffee grounds) in your vomit.  You have black or bloody stools.  You have a severe headache or stiff neck.  You are confused.  You have severe abdominal pain.  You have chest pain or trouble breathing.  You do not urinate at least once every 8 hours.  You develop cold or clammy skin.  You continue to vomit for longer than 24 to 48 hours.  You have a fever. MAKE SURE YOU:   Understand these instructions.  Will watch your condition.  Will get help right away if you are not doing well or get worse. Document Released: 12/06/2005 Document Revised: 02/28/2012 Document Reviewed: 05/05/2011 [facility] Patient Information 2015 [place], [place]. This information is not intended to replace advice given to you by your health care provider. Make sure you discuss any questions you have with your health care provider.  Diarrhea Diarrhea is frequent loose and watery bowel movements. It can cause you to feel weak and dehydrated. Dehydration can cause you to become tired and thirsty, have a dry mouth, and have decreased urination that often is dark yellow. Diarrhea is a  sign of another problem, most often an infection that will not last long. In most cases, diarrhea typically lasts 2-3 days. However, it can last longer if it is a sign of something more serious. It is important to treat your diarrhea as directed by your caregiver to lessen or prevent future episodes of diarrhea. CAUSES  Some common causes include:  Gastrointestinal infections caused by viruses, bacteria, or parasites.  Food poisoning or food allergies.  Certain medicines, such as antibiotics, chemotherapy, and laxatives.  Artificial sweeteners and fructose.  Digestive disorders. HOME CARE INSTRUCTIONS  Ensure adequate fluid intake (hydration): Have 1 cup (8 oz) of fluid for each diarrhea episode. Avoid fluids that contain simple sugars or sports drinks, fruit juices, whole milk products, and sodas. Your urine should be clear or pale yellow if you are drinking enough fluids. Hydrate with an oral rehydration solution that you  can purchase at pharmacies, retail stores, and online. You can prepare an oral rehydration solution at home by mixing the following ingredients together:   - tsp table salt.   tsp baking soda.   tsp salt substitute containing potassium chloride.  1  tablespoons sugar.  1 L (34 oz) of water.  Certain foods and beverages may increase the speed at which food moves through the gastrointestinal (GI) tract. These foods and beverages should be avoided and include:  Caffeinated and alcoholic beverages.  High-fiber foods, such as raw fruits and vegetables, nuts, seeds, and whole grain breads and cereals.  Foods and beverages sweetened with sugar alcohols, such as xylitol, sorbitol, and mannitol.  Some foods may be well tolerated and may help thicken stool including:  Starchy foods, such as rice, toast, pasta, low-sugar cereal, oatmeal, grits, baked potatoes, crackers, and bagels.  Bananas.  Applesauce.  Add probiotic-rich foods to help increase healthy bacteria  in the GI tract, such as yogurt and fermented milk products.  Wash your hands well after each diarrhea episode.  Only take over-the-counter or prescription medicines as directed by your caregiver.  Take a warm bath to relieve any burning or pain from frequent diarrhea episodes. SEEK IMMEDIATE MEDICAL CARE IF:   You are unable to keep fluids down.  You have persistent vomiting.  You have blood in your stool, or your stools are black and tarry.  You do not urinate in 6-8 hours, or there is only a small amount of very dark urine.  You have abdominal pain that increases or localizes.  You have weakness, dizziness, confusion, or light-headedness.  You have a severe headache.  Your diarrhea gets worse or does not get better.  You have a fever or persistent symptoms for more than 2-3 days.  You have a fever and your symptoms suddenly get worse. MAKE SURE YOU:   Understand these instructions.  Will watch your condition.  Will get help right away if you are not doing well or get worse. Document Released: 11/26/2002 Document Revised: 04/22/2014 Document Reviewed: 08/13/2012 [facility] Patient Information 2015 [place], [place]. This information is not intended to replace advice given to you by your health care provider. Make sure you discuss any questions you have with your health care provider.

## 2014-08-15 NOTE — ED Notes (Signed)
Pt given Sprite

## 2014-08-15 NOTE — Telephone Encounter (Signed)
Patient called and spoke with receptionist. She is asking for pain medication and Zofran. She has an appointment with [place] next week post hospital admission. She states that she has an appointment with [facility] next week and cannot get the medication until she is seen there.  Patient was advised that she should call her PCP. No direct response from the patient ,other than she would end up in the ED if we didn't fill them.  I called her Pharmacy - [facility]. He states that the patient recd' # 30 of OxyContin 80 mg - Take 1 by mouth twice a day on 07/24/2014.

## 2014-08-18 NOTE — Telephone Encounter (Signed)
She needs to get pain medication from her pain clinic physician

## 2014-08-20 ENCOUNTER — Ambulatory Visit (INDEPENDENT_AMBULATORY_CARE_PROVIDER_SITE_OTHER): Payer: Medicare Other | Admitting: Internal Medicine

## 2015-01-02 ENCOUNTER — Encounter (HOSPITAL_COMMUNITY): Payer: Self-pay | Admitting: Internal Medicine

## 2015-02-19 ENCOUNTER — Emergency Department (HOSPITAL_COMMUNITY)
Admission: EM | Admit: 2015-02-19 | Discharge: 2015-02-19 | Disposition: A | Discharge status: Home / Self Care | Payer: Medicare Other | Attending: Emergency Medicine | Admitting: Emergency Medicine

## 2015-02-19 ENCOUNTER — Encounter (HOSPITAL_COMMUNITY): Payer: Self-pay | Admitting: Emergency Medicine

## 2015-02-19 DIAGNOSIS — Z9981 Dependence on supplemental oxygen: Secondary | ICD-10-CM | POA: Diagnosis not present

## 2015-02-19 DIAGNOSIS — R531 Weakness: Secondary | ICD-10-CM | POA: Diagnosis not present

## 2015-02-19 DIAGNOSIS — G8929 Other chronic pain: Secondary | ICD-10-CM | POA: Diagnosis not present

## 2015-02-19 DIAGNOSIS — G43909 Migraine, unspecified, not intractable, without status migrainosus: Secondary | ICD-10-CM | POA: Insufficient documentation

## 2015-02-19 DIAGNOSIS — R110 Nausea: Secondary | ICD-10-CM | POA: Diagnosis present

## 2015-02-19 DIAGNOSIS — Z79899 Other long term (current) drug therapy: Secondary | ICD-10-CM | POA: Diagnosis not present

## 2015-02-19 DIAGNOSIS — R109 Unspecified abdominal pain: Secondary | ICD-10-CM | POA: Diagnosis not present

## 2015-02-19 DIAGNOSIS — G4730 Sleep apnea, unspecified: Secondary | ICD-10-CM | POA: Insufficient documentation

## 2015-02-19 DIAGNOSIS — R404 Transient alteration of awareness: Secondary | ICD-10-CM | POA: Diagnosis not present

## 2015-02-19 DIAGNOSIS — Z880 Allergy status to penicillin: Secondary | ICD-10-CM | POA: Insufficient documentation

## 2015-02-19 DIAGNOSIS — E039 Hypothyroidism, unspecified: Secondary | ICD-10-CM | POA: Diagnosis not present

## 2015-02-19 DIAGNOSIS — Z720 Tobacco use: Secondary | ICD-10-CM | POA: Diagnosis not present

## 2015-02-19 DIAGNOSIS — R112 Nausea with vomiting, unspecified: Secondary | ICD-10-CM | POA: Insufficient documentation

## 2015-02-19 LAB — COMPREHENSIVE METABOLIC PANEL
ALK PHOS: 75 U/L (ref 39–117)
ALT: 16 U/L (ref 0–35)
ANION GAP: 12 (ref 5–15)
AST: 22 U/L (ref 0–37)
Albumin: 5 g/dL (ref 3.5–5.2)
BILIRUBIN TOTAL: 0.5 mg/dL (ref 0.3–1.2)
BUN: 10 mg/dL (ref 6–23)
CHLORIDE: 98 mmol/L (ref 96–112)
CO2: 30 mmol/L (ref 19–32)
Calcium: 9.8 mg/dL (ref 8.4–10.5)
Creatinine, Ser: 1.53 mg/dL — ABNORMAL HIGH (ref 0.50–1.10)
GFR calc non Af Amer: 37 mL/min — ABNORMAL LOW (ref >90)
GFR, EST AFRICAN AMERICAN: 43 mL/min — AB (ref >90)
GLUCOSE: 119 mg/dL — AB (ref 70–99)
POTASSIUM: 4.2 mmol/L (ref 3.5–5.1)
Sodium: 140 mmol/L (ref 135–145)
Total Protein: 8.4 g/dL — ABNORMAL HIGH (ref 6.0–8.3)

## 2015-02-19 LAB — CBC WITH DIFFERENTIAL/PLATELET
Basophils Absolute: 0 10*3/uL (ref 0.0–0.1)
Basophils Relative: 1 % (ref 0–1)
EOS PCT: 1 % (ref 0–5)
Eosinophils Absolute: 0 10*3/uL (ref 0.0–0.7)
HCT: 42.2 % (ref 36.0–46.0)
HEMOGLOBIN: 14.6 g/dL (ref 12.0–15.0)
LYMPHS PCT: 38 % (ref 12–46)
Lymphs Abs: 2.9 10*3/uL (ref 0.7–4.0)
MCH: 36.8 pg — ABNORMAL HIGH (ref 26.0–34.0)
MCHC: 34.6 g/dL (ref 30.0–36.0)
MCV: 106.3 fL — ABNORMAL HIGH (ref 78.0–100.0)
MONOS PCT: 5 % (ref 3–12)
Monocytes Absolute: 0.4 10*3/uL (ref 0.1–1.0)
Neutro Abs: 4.3 10*3/uL (ref 1.7–7.7)
Neutrophils Relative %: 57 % (ref 43–77)
PLATELETS: 96 10*3/uL — AB (ref 150–400)
RBC: 3.97 MIL/uL (ref 3.87–5.11)
RDW: 15.8 % — ABNORMAL HIGH (ref 11.5–15.5)
WBC: 7.6 10*3/uL (ref 4.0–10.5)

## 2015-02-19 LAB — URINALYSIS, ROUTINE W REFLEX MICROSCOPIC
BILIRUBIN URINE: NEGATIVE
Glucose, UA: NEGATIVE mg/dL
HGB URINE DIPSTICK: NEGATIVE
KETONES UR: NEGATIVE mg/dL
Leukocytes, UA: NEGATIVE
Nitrite: NEGATIVE
Protein, ur: NEGATIVE mg/dL
Specific Gravity, Urine: 1.015 (ref 1.005–1.030)
Urobilinogen, UA: 0.2 mg/dL (ref 0.0–1.0)
pH: 7 (ref 5.0–8.0)

## 2015-02-19 LAB — TSH: TSH: >90 u[IU]/mL — ABNORMAL HIGH (ref 0.350–4.500)

## 2015-02-19 MED ORDER — LEVOTHYROXINE SODIUM 175 MCG PO TABS: 175.0000 ug | ORAL_TABLET | Freq: Every day | ORAL | Status: DC

## 2015-02-19 MED ORDER — ONDANSETRON 4 MG PO TBDP: 4.0000 mg | ORAL_TABLET | Freq: Three times a day (TID) | ORAL | Status: DC | PRN

## 2015-02-19 MED ORDER — ONDANSETRON 4 MG PO TBDP
4.0000 mg | ORAL_TABLET | Freq: Once | ORAL | Status: AC
  Administered 2015-02-19: 4 mg via ORAL
  Filled 2015-02-19: qty 1

## 2015-02-19 MED ORDER — LEVOTHYROXINE SODIUM 50 MCG PO TABS: 150.0000 ug | ORAL_TABLET | Freq: Every day | ORAL | Status: DC

## 2015-02-19 NOTE — ED Notes (Signed)
EDP at bedside  

## 2015-02-19 NOTE — ED Provider Notes (Signed)
CSN: [id number]     Arrival date & time 02/19/15  1202 History  This chart was scribed for Tanna Furry, MD by Einar Pheasant, ED Scribe. This patient was seen in room APA04/[facility]04 and the patient's care was started at 3:01 PM.    Chief Complaint  Patient presents with  . Weakness     The history is provided by the patient and medical records. No language interpreter was used.   HPI Comments: Deborah Russo is a 54 y.o. female with PMhx of chronic pain and chronic abdominal pain presents to the Emergency Department complaining of gradual onset persistent weakness that has been ongoing for the past 1-2 months. Pt states that she has a hx of chronic pain. She states that she has been having difficulty walking, staying up, or holding her hands up. Pt states that she is unsure of what is going on with her. She endorses associated nausea that has been going on for the past month but has not been able to see her PCP regarding her symptoms. Pt denies anyfever, neck pain, sore throat, visual disturbance, CP, cough, SOB, abdominal pain, nausea, emesis, diarrhea, urinary symptoms, back pain, HA, numbness and rash as associated symptoms.     Past Medical History  Diagnosis Date  . Chronic pain   . Chronic migraine   . Chronic abdominal pain     since approximately 1991  . Nausea, vomiting, and diarrhea     recurrent, chronic  . Pain management   . Hypothyroidism   . Sleep apnea     has CPAP, but doesn't wear it bc she says "i can't sleep with it on"   Past Surgical History  Procedure Laterality Date  . Cesarean section    . Cholecystectomy    . Abdominal hysterectomy    . Abdominal surgery    . Esophagogastroduodenoscopy N/A 07/17/2014    Procedure: ESOPHAGOGASTRODUODENOSCOPY (EGD);  Surgeon: Rogene Houston, MD;  Location: [facility];  Service: Endoscopy;  Laterality: N/A;  . Esophageal biopsy N/A 07/17/2014    Procedure: BIOPSY;  Surgeon: Rogene Houston, MD;  Location: [facility];  Service:  Endoscopy;  Laterality: N/A;   Family History  Problem Relation Age of Onset  . Asthma Mother   . Cancer Father    History  Substance Use Topics  . Smoking status: Current Every Day Smoker -- 0.50 packs/day for 15 years    Types: Cigarettes  . Smokeless tobacco: Never Used  . Alcohol Use: No   OB History    Gravida Para Term Preterm AB TAB SAB Ectopic Multiple Living   2 1 1  1 1          Review of Systems  Constitutional: Negative for fever, chills, diaphoresis, appetite change and fatigue.  HENT: Negative for mouth sores, sore throat and trouble swallowing.   Eyes: Negative for visual disturbance.  Respiratory: Negative for cough, chest tightness, shortness of breath and wheezing.   Cardiovascular: Negative for chest pain.  Gastrointestinal: Positive for nausea and abdominal pain. Negative for vomiting, diarrhea and abdominal distention.  Endocrine: Negative for polydipsia, polyphagia and polyuria.  Genitourinary: Negative for dysuria, frequency and hematuria.  Musculoskeletal: Negative for gait problem.  Skin: Negative for color change, pallor and rash.  Neurological: Positive for weakness. Negative for dizziness, syncope, light-headedness and headaches.  Hematological: Does not bruise/bleed easily.  Psychiatric/Behavioral: Negative for behavioral problems and confusion.   Allergies  Penicillins and Sulfa antibiotics  Home Medications   Prior to Admission  medications   Medication Sig Start Date End Date Taking? Authorizing Provider  diazepam (VALIUM) 5 MG tablet Take 5 mg by mouth 3 (three) times daily.   Yes Historical Provider, MD  OxyCODONE (OXYCONTIN) 80 mg T12A 12 hr tablet Take 1 tablet (80 mg total) by mouth every 12 (twelve) hours. 07/17/14  Yes Nita Sells, MD  levothyroxine (SYNTHROID, LEVOTHROID) 175 MCG tablet Take 1 tablet (175 mcg total) by mouth daily before breakfast. 02/19/15   Tanna Furry, MD  ondansetron (ZOFRAN ODT) 4 MG disintegrating tablet Take  1 tablet (4 mg total) by mouth every 8 (eight) hours as needed for nausea. 02/19/15   Tanna Furry, MD  ondansetron (ZOFRAN) 4 MG tablet Take 1 tablet (4 mg total) by mouth every 8 (eight) hours as needed for nausea or vomiting. Patient not taking: Reported on 02/19/2015 08/15/14   Delice Bison Ward, DO  SUMAtriptan (IMITREX) 100 MG tablet Take 100 mg by mouth every 2 (two) hours as needed for migraine.    Historical Provider, MD   BP 148/98 mmHg  Pulse 73  Temp(Src) 98.1 F (36.7 C) (Oral)  Resp 18  Ht 5' 2"  (1.575 m)  Wt 198 lb (89.812 kg)  BMI 36.21 kg/m2  SpO2 99%  Physical Exam  Constitutional: She is oriented to person, place, and time. She appears well-developed and well-nourished. No distress.  HENT:  Head: Normocephalic.  Eyes: Conjunctivae are normal. Pupils are equal, round, and reactive to light. No scleral icterus.  Neck: Normal range of motion. Neck supple. No thyromegaly present.  Cardiovascular: Normal rate and regular rhythm.  Exam reveals no gallop and no friction rub.   No murmur heard. Pulmonary/Chest: Effort normal and breath sounds normal. No respiratory distress. She has no wheezes. She has no rales.  Abdominal: Soft. Bowel sounds are normal. She exhibits no distension. There is no tenderness. There is no rebound.  Musculoskeletal: Normal range of motion.  Neurological: She is alert and oriented to person, place, and time.  Skin: Skin is warm and dry. No rash noted.  Psychiatric: She has a normal mood and affect. Her behavior is normal.    ED Course  Procedures (including critical care time)  DIAGNOSTIC STUDIES: Oxygen Saturation is 99% on RA, normal by my interpretation.    COORDINATION OF CARE: 3:06 PM- pt is requesting pain medication. Told the pt to follow up with her pain specialist at [place]. Pt advised of plan for treatment and pt agrees.   Labs Review Labs Reviewed  CBC WITH DIFFERENTIAL/PLATELET - Abnormal; Notable for the following:    MCV 106.3  (*)    MCH 36.8 (*)    RDW 15.8 (*)    Platelets 96 (*)    All other components within normal limits  COMPREHENSIVE METABOLIC PANEL - Abnormal; Notable for the following:    Glucose, Bld 119 (*)    Creatinine, Ser 1.53 (*)    Total Protein 8.4 (*)    GFR calc non Af Amer 37 (*)    GFR calc Af Amer 43 (*)    All other components within normal limits  TSH - Abnormal; Notable for the following:    TSH >90.000 (*)    All other components within normal limits  URINALYSIS, ROUTINE W REFLEX MICROSCOPIC    Imaging Review No results found.   EKG Interpretation None      MDM   Final diagnoses:  Non-intractable vomiting with nausea, vomiting of unspecified type  Hypothyroidism, unspecified hypothyroidism type  Chronic pain    Patient is here for several hours awaiting lab results. No vomiting. Patient is upset that she will not be admitted to the hospital because "you haven't done shit to help me".  Discussed with her that her symptoms were chronic. I referred her back her primary care physician regarding ongoing pain management. Given her a dose of, and prescription for her Synthroid. Did discuss with her that her TSH was markedly high she has been offered medication for several months and that it would take likely several weeks on medication to feel well again.  I discussed with her her creatinine being slightly high. I do not feel this requires hospitalization she is able to drink.    I personally performed the services described in this documentation, which was scribed in my presence. The recorded information has been reviewed and is accurate.    Tanna Furry, MD 02/22/15 [phone]

## 2015-02-19 NOTE — ED Notes (Signed)
Pt reports increasing weakness. Has not had thyroid medication in 2 months. Pt states she can't move her hands and arms, moving them in Triage. Pt pulling blankets and moving clothing. Pt also c/o chronic abd pain for which she takes oxycontin BID.

## 2015-02-19 NOTE — ED Notes (Signed)
Went in to assess pt. Pt remains on the phone. nad noted.

## 2015-02-19 NOTE — Discharge Instructions (Signed)
Continue to follow with your pain management physician regarding your ongoing pain medication needs. As a rule, the emergency physicians at [facility], and [facility] do not treat chronic pain in the emergency room. Fill prescription for, and take your Synthroid as prescribed. After being off his Synthroid for several months, it may take several weeks to feel better. Drink well, stay hydrated. Zofran for nausea.  Nausea, Adult Nausea is the feeling that you have an upset stomach or have to vomit. Nausea by itself is not likely a serious concern, but it may be an early sign of more serious medical problems. As nausea gets worse, it can lead to vomiting. If vomiting develops, there is the risk of dehydration.  CAUSES   Viral infections.  Food poisoning.  Medicines.  Pregnancy.  Motion sickness.  Migraine headaches.  Emotional distress.  Severe pain from any source.  Alcohol intoxication. HOME CARE INSTRUCTIONS  Get plenty of rest.  Ask your caregiver about specific rehydration instructions.  Eat small amounts of food and sip liquids more often.  Take all medicines as told by your caregiver. SEEK MEDICAL CARE IF:  You have not improved after 2 days, or you get worse.  You have a headache. SEEK IMMEDIATE MEDICAL CARE IF:   You have a fever.  You faint.  You keep vomiting or have blood in your vomit.  You are extremely weak or dehydrated.  You have dark or bloody stools.  You have severe chest or abdominal pain. MAKE SURE YOU:  Understand these instructions.  Will watch your condition.  Will get help right away if you are not doing well or get worse. Document Released: 01/13/2005 Document Revised: 08/30/2012 Document Reviewed: 08/18/2011 [facility] Patient Information 2015 [place], [place]. This information is not intended to replace advice given to you by your health care provider. Make sure you discuss any questions you have with your health  care provider.  Hypothyroidism The thyroid is a large gland located in the lower front of your neck. The thyroid gland helps control metabolism. Metabolism is how your body handles food. It controls metabolism with the hormone thyroxine. When this gland is underactive (hypothyroid), it produces too little hormone.  CAUSES These include:   Absence or destruction of thyroid tissue.  Goiter due to iodine deficiency.  Goiter due to medications.  Congenital defects (since birth).  Problems with the pituitary. This causes a lack of TSH (thyroid stimulating hormone). This hormone tells the thyroid to turn out more hormone. SYMPTOMS  Lethargy (feeling as though you have no energy)  Cold intolerance  Weight gain (in spite of normal food intake)  Dry skin  Coarse hair  Menstrual irregularity (if severe, may lead to infertility)  Slowing of thought processes Cardiac problems are also caused by insufficient amounts of thyroid hormone. Hypothyroidism in the newborn is cretinism, and is an extreme form. It is important that this form be treated adequately and immediately or it will lead rapidly to retarded physical and mental development. DIAGNOSIS  To prove hypothyroidism, your caregiver may do blood tests and ultrasound tests. Sometimes the signs are hidden. It may be necessary for your caregiver to watch this illness with blood tests either before or after diagnosis and treatment. TREATMENT  Low levels of thyroid hormone are increased by using synthetic thyroid hormone. This is a safe, effective treatment. It usually takes about four weeks to gain the full effects of the medication. After you have the full effect of the medication, it  will generally take another four weeks for problems to leave. Your caregiver may start you on low doses. If you have had heart problems the dose may be gradually increased. It is generally not an emergency to get rapidly to normal. HOME CARE INSTRUCTIONS    Take your medications as your caregiver suggests. Let your caregiver know of any medications you are taking or start taking. Your caregiver will help you with dosage schedules.  As your condition improves, your dosage needs may increase. It will be necessary to have continuing blood tests as suggested by your caregiver.  Report all suspected medication side effects to your caregiver. SEEK MEDICAL CARE IF: Seek medical care if you develop:  Sweating.  Tremulousness (tremors).  Anxiety.  Rapid weight loss.  Heat intolerance.  Emotional swings.  Diarrhea.  Weakness. SEEK IMMEDIATE MEDICAL CARE IF:  You develop chest pain, an irregular heart beat (palpitations), or a rapid heart beat. MAKE SURE YOU:   Understand these instructions.  Will watch your condition.  Will get help right away if you are not doing well or get worse. Document Released: 12/06/2005 Document Revised: 02/28/2012 Document Reviewed: 07/26/2008 [facility] Patient Information 2015 [place], [place]. This information is not intended to replace advice given to you by your health care provider. Make sure you discuss any questions you have with your health care provider.  Chronic Pain Chronic pain can be defined as pain that is off and on and lasts for 3-6 months or longer. Many things cause chronic pain, which can make it difficult to make a diagnosis. There are many treatment options available for chronic pain. However, finding a treatment that works well for you may require trying various approaches until the right one is found. Many people benefit from a combination of two or more types of treatment to control their pain. SYMPTOMS  Chronic pain can occur anywhere in the body and can range from mild to very severe. Some types of chronic pain include:  Headache.  Low back pain.  Cancer pain.  Arthritis pain.  Neurogenic pain. This is pain resulting from damage to nerves. People with chronic pain may also  have other symptoms such as:  Depression.  Anger.  Insomnia.  Anxiety. DIAGNOSIS  Your health care provider will help diagnose your condition over time. In many cases, the initial focus will be on excluding possible conditions that could be causing the pain. Depending on your symptoms, your health care provider may order tests to diagnose your condition. Some of these tests may include:   Blood tests.   CT scan.   MRI.   X-rays.   Ultrasounds.   Nerve conduction studies.  You may need to see a specialist.  TREATMENT  Finding treatment that works well may take time. You may be referred to a pain specialist. He or she may prescribe medicine or therapies, such as:   Mindful meditation or yoga.  Shots (injections) of numbing or pain-relieving medicines into the spine or area of pain.  Local electrical stimulation.  Acupuncture.   Massage therapy.   Aroma, color, light, or sound therapy.   Biofeedback.   Working with a physical therapist to keep from getting stiff.   Regular, gentle exercise.   Cognitive or behavioral therapy.   Group support.  Sometimes, surgery may be recommended.  HOME CARE INSTRUCTIONS   Take all medicines as directed by your health care provider.   Lessen stress in your life by relaxing and doing things such as listening to calming  music.   Exercise or be active as directed by your health care provider.   Eat a healthy diet and include things such as vegetables, fruits, fish, and lean meats in your diet.   Keep all follow-up appointments with your health care provider.   Attend a support group with others suffering from chronic pain. SEEK MEDICAL CARE IF:   Your pain gets worse.   You develop a new pain that was not there before.   You cannot tolerate medicines given to you by your health care provider.   You have new symptoms since your last visit with your health care provider.  SEEK IMMEDIATE MEDICAL  CARE IF:   You feel weak.   You have decreased sensation or numbness.   You lose control of bowel or bladder function.   Your pain suddenly gets much worse.   You develop shaking.  You develop chills.  You develop confusion.  You develop chest pain.  You develop shortness of breath.  MAKE SURE YOU:  Understand these instructions.  Will watch your condition.  Will get help right away if you are not doing well or get worse. Document Released: 08/28/2002 Document Revised: 08/08/2013 Document Reviewed: 06/01/2013 [facility] Patient Information 2015 [place], [place]. This information is not intended to replace advice given to you by your health care provider. Make sure you discuss any questions you have with your health care provider.

## 2015-08-06 DIAGNOSIS — R112 Nausea with vomiting, unspecified: Secondary | ICD-10-CM | POA: Diagnosis not present

## 2015-08-06 DIAGNOSIS — R531 Weakness: Secondary | ICD-10-CM | POA: Diagnosis not present

## 2015-08-06 DIAGNOSIS — M549 Dorsalgia, unspecified: Secondary | ICD-10-CM | POA: Diagnosis not present

## 2015-08-06 DIAGNOSIS — S299XXA Unspecified injury of thorax, initial encounter: Secondary | ICD-10-CM | POA: Diagnosis not present

## 2015-08-06 DIAGNOSIS — M545 Low back pain: Secondary | ICD-10-CM | POA: Diagnosis not present

## 2015-08-06 DIAGNOSIS — R079 Chest pain, unspecified: Secondary | ICD-10-CM | POA: Diagnosis not present

## 2015-08-06 DIAGNOSIS — R1110 Vomiting, unspecified: Secondary | ICD-10-CM | POA: Diagnosis not present

## 2015-08-06 DIAGNOSIS — S3992XA Unspecified injury of lower back, initial encounter: Secondary | ICD-10-CM | POA: Diagnosis not present

## 2015-09-02 ENCOUNTER — Emergency Department (HOSPITAL_COMMUNITY)
Admission: EM | Admit: 2015-09-02 | Discharge: 2015-09-02 | Disposition: A | Discharge status: Home / Self Care | Payer: Medicare Other | Attending: Emergency Medicine | Admitting: Emergency Medicine

## 2015-09-02 ENCOUNTER — Emergency Department (HOSPITAL_COMMUNITY): Payer: Medicare Other

## 2015-09-02 ENCOUNTER — Encounter (HOSPITAL_COMMUNITY): Payer: Self-pay | Admitting: Emergency Medicine

## 2015-09-02 DIAGNOSIS — R109 Unspecified abdominal pain: Secondary | ICD-10-CM | POA: Diagnosis present

## 2015-09-02 DIAGNOSIS — E039 Hypothyroidism, unspecified: Secondary | ICD-10-CM | POA: Diagnosis not present

## 2015-09-02 DIAGNOSIS — Z9049 Acquired absence of other specified parts of digestive tract: Secondary | ICD-10-CM | POA: Insufficient documentation

## 2015-09-02 DIAGNOSIS — R112 Nausea with vomiting, unspecified: Secondary | ICD-10-CM | POA: Diagnosis not present

## 2015-09-02 DIAGNOSIS — Z90710 Acquired absence of both cervix and uterus: Secondary | ICD-10-CM | POA: Insufficient documentation

## 2015-09-02 DIAGNOSIS — Z880 Allergy status to penicillin: Secondary | ICD-10-CM | POA: Diagnosis not present

## 2015-09-02 DIAGNOSIS — G894 Chronic pain syndrome: Secondary | ICD-10-CM | POA: Diagnosis not present

## 2015-09-02 DIAGNOSIS — G8929 Other chronic pain: Secondary | ICD-10-CM | POA: Insufficient documentation

## 2015-09-02 DIAGNOSIS — Z79899 Other long term (current) drug therapy: Secondary | ICD-10-CM | POA: Diagnosis not present

## 2015-09-02 DIAGNOSIS — R1084 Generalized abdominal pain: Secondary | ICD-10-CM | POA: Diagnosis not present

## 2015-09-02 DIAGNOSIS — Z9889 Other specified postprocedural states: Secondary | ICD-10-CM | POA: Diagnosis not present

## 2015-09-02 DIAGNOSIS — Z8669 Personal history of other diseases of the nervous system and sense organs: Secondary | ICD-10-CM | POA: Insufficient documentation

## 2015-09-02 DIAGNOSIS — R531 Weakness: Secondary | ICD-10-CM | POA: Diagnosis not present

## 2015-09-02 DIAGNOSIS — R140 Abdominal distension (gaseous): Secondary | ICD-10-CM | POA: Insufficient documentation

## 2015-09-02 DIAGNOSIS — R197 Diarrhea, unspecified: Secondary | ICD-10-CM | POA: Insufficient documentation

## 2015-09-02 DIAGNOSIS — Z720 Tobacco use: Secondary | ICD-10-CM | POA: Diagnosis not present

## 2015-09-02 LAB — CBC WITH DIFFERENTIAL/PLATELET
BASOS ABS: 0.1 10*3/uL (ref 0.0–0.1)
BASOS PCT: 1 % (ref 0–1)
Eosinophils Absolute: 0.1 10*3/uL (ref 0.0–0.7)
Eosinophils Relative: 1 % (ref 0–5)
HEMATOCRIT: 40.2 % (ref 36.0–46.0)
Hemoglobin: 13.7 g/dL (ref 12.0–15.0)
LYMPHS PCT: 37 % (ref 12–46)
Lymphs Abs: 3 10*3/uL (ref 0.7–4.0)
MCH: 36.4 pg — ABNORMAL HIGH (ref 26.0–34.0)
MCHC: 34.1 g/dL (ref 30.0–36.0)
MCV: 106.9 fL — AB (ref 78.0–100.0)
MONO ABS: 0.3 10*3/uL (ref 0.1–1.0)
Monocytes Relative: 4 % (ref 3–12)
NEUTROS ABS: 4.8 10*3/uL (ref 1.7–7.7)
NEUTROS PCT: 57 % (ref 43–77)
Platelets: 149 10*3/uL — ABNORMAL LOW (ref 150–400)
RBC: 3.76 MIL/uL — ABNORMAL LOW (ref 3.87–5.11)
RDW: 17.3 % — AB (ref 11.5–15.5)
WBC: 8.2 10*3/uL (ref 4.0–10.5)

## 2015-09-02 LAB — URINALYSIS, ROUTINE W REFLEX MICROSCOPIC
Bilirubin Urine: NEGATIVE
Glucose, UA: NEGATIVE mg/dL
HGB URINE DIPSTICK: NEGATIVE
KETONES UR: NEGATIVE mg/dL
Leukocytes, UA: NEGATIVE
Nitrite: NEGATIVE
PH: 6.5 (ref 5.0–8.0)
Protein, ur: NEGATIVE mg/dL
Urobilinogen, UA: 0.2 mg/dL (ref 0.0–1.0)

## 2015-09-02 LAB — COMPREHENSIVE METABOLIC PANEL
ALBUMIN: 3.9 g/dL (ref 3.5–5.0)
ALK PHOS: 81 U/L (ref 38–126)
ALT: 12 U/L — ABNORMAL LOW (ref 14–54)
AST: 26 U/L (ref 15–41)
Anion gap: 13 (ref 5–15)
BILIRUBIN TOTAL: 0.6 mg/dL (ref 0.3–1.2)
BUN: 6 mg/dL (ref 6–20)
CO2: 29 mmol/L (ref 22–32)
Calcium: 8.5 mg/dL — ABNORMAL LOW (ref 8.9–10.3)
Chloride: 91 mmol/L — ABNORMAL LOW (ref 101–111)
Creatinine, Ser: 1.11 mg/dL — ABNORMAL HIGH (ref 0.44–1.00)
GFR calc Af Amer: >60 mL/min (ref >60)
GFR, EST NON AFRICAN AMERICAN: 54 mL/min — AB (ref >60)
Glucose, Bld: 173 mg/dL — ABNORMAL HIGH (ref 65–99)
POTASSIUM: 2.3 mmol/L — AB (ref 3.5–5.1)
Sodium: 133 mmol/L — ABNORMAL LOW (ref 135–145)
TOTAL PROTEIN: 7.2 g/dL (ref 6.5–8.1)

## 2015-09-02 LAB — BASIC METABOLIC PANEL
ANION GAP: 9 (ref 5–15)
BUN: 5 mg/dL — ABNORMAL LOW (ref 6–20)
CALCIUM: 7.9 mg/dL — AB (ref 8.9–10.3)
CO2: 31 mmol/L (ref 22–32)
Chloride: 98 mmol/L — ABNORMAL LOW (ref 101–111)
Creatinine, Ser: 1.07 mg/dL — ABNORMAL HIGH (ref 0.44–1.00)
GFR calc Af Amer: >60 mL/min (ref >60)
GFR, EST NON AFRICAN AMERICAN: 57 mL/min — AB (ref >60)
GLUCOSE: 101 mg/dL — AB (ref 65–99)
Potassium: 2.9 mmol/L — ABNORMAL LOW (ref 3.5–5.1)
Sodium: 138 mmol/L (ref 135–145)

## 2015-09-02 LAB — LIPASE, BLOOD: Lipase: 18 U/L — ABNORMAL LOW (ref 22–51)

## 2015-09-02 LAB — CBG MONITORING, ED: GLUCOSE-CAPILLARY: 175 mg/dL — AB (ref 65–99)

## 2015-09-02 LAB — TROPONIN I
Troponin I: <0.03 ng/mL (ref <0.031)
Troponin I: <0.03 ng/mL (ref <0.031)

## 2015-09-02 MED ORDER — DIPHENOXYLATE-ATROPINE 2.5-0.025 MG PO TABS
2.0000 | ORAL_TABLET | Freq: Once | ORAL | Status: AC
  Administered 2015-09-02: 2 via ORAL
  Filled 2015-09-02: qty 2

## 2015-09-02 MED ORDER — SODIUM CHLORIDE 0.9 % IV BOLUS (SEPSIS)
1000.0000 mL | Freq: Once | INTRAVENOUS | Status: AC
  Administered 2015-09-02: 1000 mL via INTRAVENOUS

## 2015-09-02 MED ORDER — DIPHENOXYLATE-ATROPINE 2.5-0.025 MG PO TABS: 2.0000 | ORAL_TABLET | Freq: Four times a day (QID) | ORAL | Status: DC | PRN

## 2015-09-02 MED ORDER — MORPHINE SULFATE (PF) 4 MG/ML IV SOLN
4.0000 mg | Freq: Once | INTRAVENOUS | Status: AC
  Administered 2015-09-02: 4 mg via INTRAVENOUS
  Filled 2015-09-02: qty 1

## 2015-09-02 MED ORDER — DIPHENHYDRAMINE HCL 50 MG/ML IJ SOLN
25.0000 mg | Freq: Once | INTRAMUSCULAR | Status: AC
  Administered 2015-09-02: 25 mg via INTRAVENOUS
  Filled 2015-09-02: qty 1

## 2015-09-02 MED ORDER — METOCLOPRAMIDE HCL 5 MG/ML IJ SOLN
10.0000 mg | Freq: Once | INTRAMUSCULAR | Status: AC
  Administered 2015-09-02: 10 mg via INTRAVENOUS
  Filled 2015-09-02: qty 2

## 2015-09-02 MED ORDER — METOCLOPRAMIDE HCL 10 MG PO TABS: 10.0000 mg | ORAL_TABLET | Freq: Four times a day (QID) | ORAL | Status: DC | PRN

## 2015-09-02 MED ORDER — POTASSIUM CHLORIDE 10 MEQ/100ML IV SOLN
10.0000 meq | INTRAVENOUS | Status: AC
  Administered 2015-09-02 (×2): 10 meq via INTRAVENOUS
  Filled 2015-09-02 (×2): qty 100

## 2015-09-02 MED ORDER — POTASSIUM CHLORIDE CRYS ER 20 MEQ PO TBCR
40.0000 meq | EXTENDED_RELEASE_TABLET | Freq: Once | ORAL | Status: AC
  Administered 2015-09-02: 40 meq via ORAL
  Filled 2015-09-02: qty 2

## 2015-09-02 MED ORDER — POTASSIUM CHLORIDE CRYS ER 20 MEQ PO TBCR
40.0000 meq | EXTENDED_RELEASE_TABLET | Freq: Every day | ORAL | Status: DC
Start: 2015-09-02 — End: 2016-03-17

## 2015-09-02 NOTE — ED Notes (Signed)
Pt placed on bedpan and removed 2 minutes later, pt requesting BSC pt currently using BSC.

## 2015-09-02 NOTE — Discharge Instructions (Signed)

## 2015-09-02 NOTE — ED Notes (Signed)
Having v/d and weakness for last year.  Sent by Dr Josephine Cables office for low BP while in office.  Poor appetite.

## 2015-09-02 NOTE — ED Notes (Signed)
Lab called to report potassium of 2.3, results reported to Dr. Betsey Holiday.

## 2015-09-02 NOTE — ED Notes (Signed)
Notified xray to come and get pt, pt is done using bsc at this time.

## 2015-09-02 NOTE — ED Provider Notes (Signed)
CSN: [id number]     Arrival date & time 09/02/15  1549 History   First MD Initiated Contact with Patient 09/02/15 1615     Chief Complaint  Patient presents with  . Emesis  . Diarrhea  . Weakness  . Abdominal Pain     (Consider location/radiation/quality/duration/timing/severity/associated sxs/prior Treatment) HPI Comments: Patient presents to the ER for evaluation of nausea, vomiting, diarrhea, abdominal pain. Patient reports that symptoms have been ongoing for one year. Patient reports that she saw her primary doctor in the office today and her blood pressure was low, so she was sent to the ER for further evaluation.  She reports cramping abdominal pain with bloating. She reports that she was seen at [facility] just over one month ago and had a CAT scan that did not show any acute abnormality. Symptoms are unchanged.  Patient is a 54 y.o. female presenting with vomiting, diarrhea, weakness, and abdominal pain.  Emesis Associated symptoms: abdominal pain and diarrhea   Diarrhea Associated symptoms: abdominal pain and vomiting   Weakness Associated symptoms include abdominal pain.  Abdominal Pain Associated symptoms: diarrhea, nausea and vomiting     Past Medical History  Diagnosis Date  . Chronic pain   . Chronic migraine   . Chronic abdominal pain     since approximately 1991  . Nausea, vomiting, and diarrhea     recurrent, chronic  . Pain management   . Hypothyroidism   . Sleep apnea     has CPAP, but doesn't wear it bc she says "i can't sleep with it on"   Past Surgical History  Procedure Laterality Date  . Cesarean section    . Cholecystectomy    . Abdominal hysterectomy    . Abdominal surgery    . Esophagogastroduodenoscopy N/A 07/17/2014    Procedure: ESOPHAGOGASTRODUODENOSCOPY (EGD);  Surgeon: Rogene Houston, MD;  Location: [facility];  Service: Endoscopy;  Laterality: N/A;  . Esophageal biopsy N/A 07/17/2014    Procedure: BIOPSY;  Surgeon: Rogene Houston, MD;  Location: [facility];  Service: Endoscopy;  Laterality: N/A;   Family History  Problem Relation Age of Onset  . Asthma Mother   . Cancer Father    Social History  Substance Use Topics  . Smoking status: Current Every Day Smoker -- 0.50 packs/day for 15 years    Types: Cigarettes  . Smokeless tobacco: Never Used  . Alcohol Use: No   OB History    Gravida Para Term Preterm AB TAB SAB Ectopic Multiple Living   2 1 1  1 1          Review of Systems  Gastrointestinal: Positive for nausea, vomiting, abdominal pain, diarrhea and abdominal distention.  Neurological: Positive for weakness.  All other systems reviewed and are negative.     Allergies  Penicillins and Sulfa antibiotics  Home Medications   Prior to Admission medications   Medication Sig Start Date End Date Taking? Authorizing Provider  diazepam (VALIUM) 5 MG tablet Take 5 mg by mouth 3 (three) times daily.   Yes Historical Provider, MD  Fructose-Dextrose-Phosphor Acd (ANTI-NAUSEA PO) Take 1 tablet by mouth every 4 (four) hours as needed (for nausea).   Yes Historical Provider, MD  loperamide (IMODIUM) 2 MG capsule Take 4 mg by mouth as needed for diarrhea or loose stools.   Yes Historical Provider, MD  OxyCODONE (OXYCONTIN) 80 mg T12A 12 hr tablet Take 1 tablet (80 mg total) by mouth every 12 (twelve) hours. 07/17/14  Yes  Nita Sells, MD  SUMAtriptan (IMITREX) 100 MG tablet Take 100 mg by mouth every 2 (two) hours as needed for migraine.   Yes Historical Provider, MD  diphenoxylate-atropine (LOMOTIL) 2.5-0.025 MG per tablet Take 2 tablets by mouth 4 (four) times daily as needed for diarrhea or loose stools. 09/02/15   Orpah Greek, MD  levothyroxine (SYNTHROID, LEVOTHROID) 175 MCG tablet Take 1 tablet (175 mcg total) by mouth daily before breakfast. Patient not taking: Reported on 09/02/2015 02/19/15   Tanna Furry, MD  metoCLOPramide (REGLAN) 10 MG tablet Take 1 tablet (10 mg total) by mouth every 6  (six) hours as needed for nausea (nausea/headache). 09/02/15   Orpah Greek, MD  potassium chloride SA (K-DUR,KLOR-CON) 20 MEQ tablet Take 2 tablets (40 mEq total) by mouth daily. 09/02/15   Orpah Greek, MD   BP 112/90 mmHg  Pulse 72  Temp(Src) 98 F (36.7 C) (Oral)  Resp 14  Ht 5' 3"  (1.6 m)  Wt 198 lb (89.812 kg)  BMI 35.08 kg/m2  SpO2 100% Physical Exam  Constitutional: She is oriented to person, place, and time. She appears well-developed and well-nourished. No distress.  HENT:  Head: Normocephalic and atraumatic.  Right Ear: Hearing normal.  Left Ear: Hearing normal.  Nose: Nose normal.  Mouth/Throat: Oropharynx is clear and moist and mucous membranes are normal.  Eyes: Conjunctivae and EOM are normal. Pupils are equal, round, and reactive to light.  Neck: Normal range of motion. Neck supple.  Cardiovascular: Regular rhythm, S1 normal and S2 normal.  Exam reveals no gallop and no friction rub.   No murmur heard. Pulmonary/Chest: Effort normal and breath sounds normal. No respiratory distress. She exhibits no tenderness.  Abdominal: Soft. Normal appearance and bowel sounds are normal. There is no hepatosplenomegaly. There is generalized tenderness. There is no rebound, no guarding, no tenderness at McBurney's point and negative Murphy's sign. No hernia.  Musculoskeletal: Normal range of motion.  Neurological: She is alert and oriented to person, place, and time. She has normal strength. No cranial nerve deficit or sensory deficit. Coordination normal. GCS eye subscore is 4. GCS verbal subscore is 5. GCS motor subscore is 6.  Skin: Skin is warm, dry and intact. No rash noted. No cyanosis.  Psychiatric: She has a normal mood and affect. Her speech is normal and behavior is normal. Thought content normal.  Nursing note and vitals reviewed.   ED Course  Procedures (including critical care time) Labs Review Labs Reviewed  CBC WITH DIFFERENTIAL/PLATELET - Abnormal;  Notable for the following:    RBC 3.76 (*)    MCV 106.9 (*)    MCH 36.4 (*)    RDW 17.3 (*)    Platelets 149 (*)    All other components within normal limits  COMPREHENSIVE METABOLIC PANEL - Abnormal; Notable for the following:    Sodium 133 (*)    Potassium 2.3 (*)    Chloride 91 (*)    Glucose, Bld 173 (*)    Creatinine, Ser 1.11 (*)    Calcium 8.5 (*)    ALT 12 (*)    GFR calc non Af Amer 54 (*)    All other components within normal limits  LIPASE, BLOOD - Abnormal; Notable for the following:    Lipase 18 (*)    All other components within normal limits  URINALYSIS, ROUTINE W REFLEX MICROSCOPIC (NOT AT [facility]) - Abnormal; Notable for the following:    Specific Gravity, Urine <1.005 (*)    All  other components within normal limits  BASIC METABOLIC PANEL - Abnormal; Notable for the following:    Potassium 2.9 (*)    Chloride 98 (*)    Glucose, Bld 101 (*)    BUN 5 (*)    Creatinine, Ser 1.07 (*)    Calcium 7.9 (*)    GFR calc non Af Amer 57 (*)    All other components within normal limits  CBG MONITORING, ED - Abnormal; Notable for the following:    Glucose-Capillary 175 (*)    All other components within normal limits  TROPONIN I  TROPONIN I    Imaging Review Dg Abd Acute W/chest  09/02/2015   CLINICAL DATA:  Acute abdominal pain. Hypotension. Chronic intermittent nausea, vomiting and diarrhea over the past year.  EXAM: DG ABDOMEN ACUTE W/ 1V CHEST  COMPARISON:  CT abdomen and pelvis 08/06/2015. Acute abdomen series 07/13/2014.  FINDINGS: Bowel gas pattern unremarkable without evidence of obstruction or significant ileus. No evidence of free air or significant air-fluid levels on the erect image. Expected stool burden in the colon. Surgical clips in the right upper quadrant from prior cholecystectomy. No visible opaque urinary tract calculi. Aortoiliac atherosclerosis. Regional skeleton intact.  Cardiac silhouette upper normal in size for [facility] technique, unchanged. Lungs clear.  Bronchovascular markings normal. Pulmonary vascularity normal. No visible pleural effusions. No pneumothorax.  IMPRESSION: No acute abdominal or pulmonary abnormality.   Electronically Signed   By: Evangeline Dakin M.D.   On: 09/02/2015 17:59   I have personally reviewed and evaluated these images and lab results as part of my medical decision-making.   EKG Interpretation   Date/Time:  Tuesday September 02 2015 16:40:26 EDT Ventricular Rate:  78 PR Interval:  209 QRS Duration: 94 QT Interval:  527 QTC Calculation: 600 R Axis:   82 Text Interpretation:  Sinus rhythm Ventricular premature complex  Borderline prolonged PR interval Consider left atrial enlargement  Nonspecific repol abnormality, diffuse leads Prolonged QT interval  Confirmed by POLLINA  MD, CHRISTOPHER [phone]) on 09/02/2015 6:01:24 PM      MDM   Final diagnoses:  Chronic abdominal pain    Patient presents to the ER for evaluation of abdominal pain. Symptoms have been going on for approximately one year. She has been expressing nausea, vomiting and diarrhea with abdominal cramping. Examination revealed diffuse abdominal tenderness without signs of peritonitis. EKG revealed diffuse nonspecific repolarization abnormality, patient is not expressing any chest pain. Troponin was negative. Patient had a second troponin performed which was negative as well. As patient's symptoms are entirely abdominal including vomiting and diarrhea, and is not felt that this is consistent with acute coronary syndrome.   CT scan at [facility] for these symptoms recently. I do not feel she requires a repeat CT. Lab work was essentially normal except for significant hypokalemia. She was given oral and IV replacement, recheck was much improved. She will be discharged, symptomatically treatment for vomiting and diarrhea, continue potassium replacement. Call [name], her gastroenterologist for recheck.    Orpah Greek,  MD 09/02/15 2129

## 2015-10-14 DIAGNOSIS — M545 Low back pain: Secondary | ICD-10-CM | POA: Diagnosis not present

## 2016-03-17 ENCOUNTER — Emergency Department (HOSPITAL_COMMUNITY)
Admission: EM | Admit: 2016-03-17 | Discharge: 2016-03-17 | Disposition: A | Discharge status: Home / Self Care | Payer: Medicare Other | Attending: Emergency Medicine | Admitting: Emergency Medicine

## 2016-03-17 ENCOUNTER — Encounter (HOSPITAL_COMMUNITY): Payer: Self-pay | Admitting: *Deleted

## 2016-03-17 ENCOUNTER — Emergency Department (HOSPITAL_COMMUNITY): Payer: Medicare Other

## 2016-03-17 DIAGNOSIS — K2970 Gastritis, unspecified, without bleeding: Secondary | ICD-10-CM | POA: Diagnosis not present

## 2016-03-17 DIAGNOSIS — Z79899 Other long term (current) drug therapy: Secondary | ICD-10-CM | POA: Diagnosis not present

## 2016-03-17 DIAGNOSIS — Z9889 Other specified postprocedural states: Secondary | ICD-10-CM | POA: Diagnosis not present

## 2016-03-17 DIAGNOSIS — R1084 Generalized abdominal pain: Secondary | ICD-10-CM | POA: Diagnosis not present

## 2016-03-17 DIAGNOSIS — G8929 Other chronic pain: Secondary | ICD-10-CM | POA: Insufficient documentation

## 2016-03-17 DIAGNOSIS — E039 Hypothyroidism, unspecified: Secondary | ICD-10-CM | POA: Insufficient documentation

## 2016-03-17 DIAGNOSIS — R109 Unspecified abdominal pain: Secondary | ICD-10-CM

## 2016-03-17 DIAGNOSIS — Z791 Long term (current) use of non-steroidal anti-inflammatories (NSAID): Secondary | ICD-10-CM | POA: Diagnosis not present

## 2016-03-17 DIAGNOSIS — Z79891 Long term (current) use of opiate analgesic: Secondary | ICD-10-CM | POA: Diagnosis not present

## 2016-03-17 DIAGNOSIS — R197 Diarrhea, unspecified: Secondary | ICD-10-CM | POA: Diagnosis not present

## 2016-03-17 DIAGNOSIS — F17210 Nicotine dependence, cigarettes, uncomplicated: Secondary | ICD-10-CM | POA: Diagnosis not present

## 2016-03-17 DIAGNOSIS — R112 Nausea with vomiting, unspecified: Secondary | ICD-10-CM | POA: Diagnosis present

## 2016-03-17 DIAGNOSIS — K760 Fatty (change of) liver, not elsewhere classified: Secondary | ICD-10-CM | POA: Diagnosis not present

## 2016-03-17 LAB — LIPASE, BLOOD: Lipase: 34 U/L (ref 11–51)

## 2016-03-17 LAB — URINALYSIS, ROUTINE W REFLEX MICROSCOPIC
Bilirubin Urine: NEGATIVE
Glucose, UA: NEGATIVE mg/dL
Ketones, ur: NEGATIVE mg/dL
LEUKOCYTES UA: NEGATIVE
NITRITE: NEGATIVE
PH: 7 (ref 5.0–8.0)
Protein, ur: NEGATIVE mg/dL
SPECIFIC GRAVITY, URINE: 1.005 (ref 1.005–1.030)

## 2016-03-17 LAB — CBC WITH DIFFERENTIAL/PLATELET
BASOS ABS: 0 10*3/uL (ref 0.0–0.1)
BASOS PCT: 0 %
EOS PCT: 0 %
Eosinophils Absolute: 0 10*3/uL (ref 0.0–0.7)
HCT: 47.8 % — ABNORMAL HIGH (ref 36.0–46.0)
Hemoglobin: 16.5 g/dL — ABNORMAL HIGH (ref 12.0–15.0)
Lymphocytes Relative: 22 %
Lymphs Abs: 2.2 10*3/uL (ref 0.7–4.0)
MCH: 32.6 pg (ref 26.0–34.0)
MCHC: 34.5 g/dL (ref 30.0–36.0)
MCV: 94.5 fL (ref 78.0–100.0)
MONO ABS: 0.3 10*3/uL (ref 0.1–1.0)
Monocytes Relative: 3 %
NEUTROS ABS: 7.6 10*3/uL (ref 1.7–7.7)
Neutrophils Relative %: 75 %
PLATELETS: 182 10*3/uL (ref 150–400)
RBC: 5.06 MIL/uL (ref 3.87–5.11)
RDW: 13.3 % (ref 11.5–15.5)
WBC: 10.1 10*3/uL (ref 4.0–10.5)

## 2016-03-17 LAB — COMPREHENSIVE METABOLIC PANEL
ALBUMIN: 4.5 g/dL (ref 3.5–5.0)
ALK PHOS: 94 U/L (ref 38–126)
ALT: 16 U/L (ref 14–54)
ANION GAP: 12 (ref 5–15)
AST: 16 U/L (ref 15–41)
BILIRUBIN TOTAL: 0.9 mg/dL (ref 0.3–1.2)
BUN: 8 mg/dL (ref 6–20)
CO2: 27 mmol/L (ref 22–32)
Calcium: 9.8 mg/dL (ref 8.9–10.3)
Chloride: 98 mmol/L — ABNORMAL LOW (ref 101–111)
Creatinine, Ser: 0.74 mg/dL (ref 0.44–1.00)
GFR calc Af Amer: >60 mL/min (ref >60)
GFR calc non Af Amer: >60 mL/min (ref >60)
GLUCOSE: 150 mg/dL — AB (ref 65–99)
Potassium: 3.7 mmol/L (ref 3.5–5.1)
SODIUM: 137 mmol/L (ref 135–145)
Total Protein: 8.4 g/dL — ABNORMAL HIGH (ref 6.5–8.1)

## 2016-03-17 LAB — URINE MICROSCOPIC-ADD ON: WBC UA: NONE SEEN WBC/hpf (ref 0–5)

## 2016-03-17 MED ORDER — ONDANSETRON HCL 8 MG PO TABS: 8.0000 mg | ORAL_TABLET | ORAL | Status: DC | PRN

## 2016-03-17 MED ORDER — HYDROMORPHONE HCL 1 MG/ML IJ SOLN
1.0000 mg | Freq: Once | INTRAMUSCULAR | Status: AC
  Administered 2016-03-17: 1 mg via INTRAVENOUS
  Filled 2016-03-17: qty 1

## 2016-03-17 MED ORDER — SODIUM CHLORIDE 0.9 % IV BOLUS (SEPSIS)
1000.0000 mL | Freq: Once | INTRAVENOUS | Status: AC
  Administered 2016-03-17: 1000 mL via INTRAVENOUS
  Filled 2016-03-17: qty 1000

## 2016-03-17 MED ORDER — IOHEXOL 300 MG/ML  SOLN
100.0000 mL | Freq: Once | INTRAMUSCULAR | Status: AC | PRN
  Administered 2016-03-17: 100 mL via INTRAVENOUS

## 2016-03-17 MED ORDER — SODIUM CHLORIDE 0.9 % IV BOLUS (SEPSIS)
1000.0000 mL | Freq: Once | INTRAVENOUS | Status: AC
  Administered 2016-03-17: 1000 mL via INTRAVENOUS

## 2016-03-17 MED ORDER — OXYCODONE-ACETAMINOPHEN 5-325 MG PO TABS: 1.0000 | ORAL_TABLET | ORAL | Status: DC | PRN

## 2016-03-17 MED ORDER — FENTANYL CITRATE (PF) 100 MCG/2ML IJ SOLN
50.0000 ug | Freq: Once | INTRAMUSCULAR | Status: AC
  Administered 2016-03-17: 50 ug via INTRAVENOUS
  Filled 2016-03-17: qty 2

## 2016-03-17 MED ORDER — ONDANSETRON HCL 4 MG/2ML IJ SOLN
4.0000 mg | Freq: Once | INTRAMUSCULAR | Status: AC
  Administered 2016-03-17: 4 mg via INTRAVENOUS
  Filled 2016-03-17: qty 2

## 2016-03-17 NOTE — ED Notes (Signed)
Pt brought in by [facility] for c/on/v/d x 4 days and pt states she has had some tingling to right side; pt was given zofran 62mIV en route to ED; pt states she has been vomiting blood and c/o abdominal distention

## 2016-03-17 NOTE — Discharge Instructions (Signed)
Meds for pain and nausea. CT scan showed no life-threatening condition. Follow-up with your regular doctor

## 2016-03-17 NOTE — ED Notes (Signed)
Phlebotomy at bedside.

## 2016-03-17 NOTE — ED Provider Notes (Signed)
CSN: [id number]     Arrival date & time 03/17/16  6720 History   First MD Initiated Contact with Patient 03/17/16 0703     Chief Complaint  Patient presents with  . Emesis     (Consider location/radiation/quality/duration/timing/severity/associated sxs/prior Treatment) HPI.... Generalized abdominal pain, nausea, vomiting for 2 days. Patient has a chronic history of chronic abdominal pain. She sees [name] locally.  She has been unable to keep fluids down. No fever, chills, cough, chest pain, dysuria. Severity symptoms is moderate. Nothing makes symptoms better or worse.  Past Medical History  Diagnosis Date  . Chronic pain   . Chronic migraine   . Chronic abdominal pain     since approximately 1991  . Nausea, vomiting, and diarrhea     recurrent, chronic  . Pain management   . Hypothyroidism   . Sleep apnea     has CPAP, but doesn't wear it bc she says "i can't sleep with it on"   Past Surgical History  Procedure Laterality Date  . Cesarean section    . Cholecystectomy    . Abdominal hysterectomy    . Abdominal surgery    . Esophagogastroduodenoscopy N/A 07/17/2014    Procedure: ESOPHAGOGASTRODUODENOSCOPY (EGD);  Surgeon: Rogene Houston, MD;  Location: [facility];  Service: Endoscopy;  Laterality: N/A;  . Biopsy N/A 07/17/2014    Procedure: BIOPSY;  Surgeon: Rogene Houston, MD;  Location: [facility];  Service: Endoscopy;  Laterality: N/A;   Family History  Problem Relation Age of Onset  . Asthma Mother   . Cancer Father    Social History  Substance Use Topics  . Smoking status: Current Every Day Smoker -- 0.50 packs/day for 15 years    Types: Cigarettes  . Smokeless tobacco: Never Used  . Alcohol Use: No   OB History    Gravida Para Term Preterm AB TAB SAB Ectopic Multiple Living   2 1 1  1 1          Review of Systems  All other systems reviewed and are negative.     Allergies  Penicillins and Sulfa antibiotics  Home Medications   Prior to Admission  medications   Medication Sig Start Date End Date Taking? Authorizing Provider  diazepam (VALIUM) 5 MG tablet Take 5 mg by mouth 3 (three) times daily.   Yes Historical Provider, MD  ibuprofen (ADVIL,MOTRIN) 200 MG tablet Take 200 mg by mouth every 6 (six) hours as needed for moderate pain.   Yes Historical Provider, MD  levothyroxine (SYNTHROID, LEVOTHROID) 175 MCG tablet Take 1 tablet (175 mcg total) by mouth daily before breakfast. 02/19/15  Yes Tanna Furry, MD  OxyCODONE (OXYCONTIN) 80 mg T12A 12 hr tablet Take 1 tablet (80 mg total) by mouth every 12 (twelve) hours. 07/17/14  Yes Nita Sells, MD  SUMAtriptan (IMITREX) 100 MG tablet Take 100 mg by mouth every 2 (two) hours as needed for migraine.   Yes Historical Provider, MD  ondansetron (ZOFRAN) 8 MG tablet Take 1 tablet (8 mg total) by mouth every 4 (four) hours as needed. 03/17/16   Nat Christen, MD  oxyCODONE-acetaminophen (PERCOCET/ROXICET) 5-325 MG tablet Take 1-2 tablets by mouth every 4 (four) hours as needed for severe pain. 03/17/16   Nat Christen, MD   BP 144/97 mmHg  Pulse 74  Temp(Src) 97.7 F (36.5 C) (Oral)  Resp 18  Ht 5' 2"  (1.575 m)  Wt 180 lb (81.647 kg)  BMI 32.91 kg/m2  SpO2 98% Physical Exam  Constitutional: She is oriented to person, place, and time. She appears well-developed and well-nourished.  HENT:  Head: Normocephalic and atraumatic.  Eyes: Conjunctivae and EOM are normal. Pupils are equal, round, and reactive to light.  Neck: Normal range of motion. Neck supple.  Cardiovascular: Normal rate and regular rhythm.   Pulmonary/Chest: Effort normal and breath sounds normal.  Abdominal: Soft. Bowel sounds are normal.  Minimal tenderness inferior to umbilicus. Questionable small ventral hernia in same location  Musculoskeletal: Normal range of motion.  Neurological: She is alert and oriented to person, place, and time.  Skin: Skin is warm and dry.  Psychiatric: She has a normal mood and affect. Her behavior is  normal.  Nursing note and vitals reviewed.   ED Course  Procedures (including critical care time) Labs Review Labs Reviewed  CBC WITH DIFFERENTIAL/PLATELET - Abnormal; Notable for the following:    Hemoglobin 16.5 (*)    HCT 47.8 (*)    All other components within normal limits  COMPREHENSIVE METABOLIC PANEL - Abnormal; Notable for the following:    Chloride 98 (*)    Glucose, Bld 150 (*)    Total Protein 8.4 (*)    All other components within normal limits  URINALYSIS, ROUTINE W REFLEX MICROSCOPIC (NOT AT [facility]) - Abnormal; Notable for the following:    APPearance HAZY (*)    Hgb urine dipstick LARGE (*)    All other components within normal limits  URINE MICROSCOPIC-ADD ON - Abnormal; Notable for the following:    Squamous Epithelial / LPF TOO NUMEROUS TO COUNT (*)    Bacteria, UA MANY (*)    All other components within normal limits  LIPASE, BLOOD    Imaging Review Ct Abdomen Pelvis W Contrast  03/17/2016  CLINICAL DATA:  Nausea, vomiting and diarrhea. Tingling on the right side of the body and abdominal distention. Symptoms for 4 days. No known injury. Initial encounter. EXAM: CT ABDOMEN AND PELVIS WITH CONTRAST TECHNIQUE: Multidetector CT imaging of the abdomen and pelvis was performed using the standard protocol following bolus administration of intravenous contrast. CONTRAST:  100 ml OMNIPAQUE IOHEXOL 300 MG/ML  SOLN COMPARISON:  CT abdomen and pelvis 08/06/2015. FINDINGS: The lung bases are clear.  No pleural or pericardial effusion. The patient is status post cholecystectomy. Small amount of pneumobilia is seen as on the prior study and likely related to prior sphincterotomy. The liver is low attenuating consistent with fatty infiltration but no focal liver lesion is seen. The spleen, adrenal glands, pancreas and kidneys appear normal. Extensive aortoiliac atherosclerosis without aneurysm is noted. The patient is status post hysterectomy. The urinary bladder appears normal. The  stomach, small and large bowel and appendix are unremarkable. There is no lymphadenopathy or fluid. No focal bony abnormality is identified. IMPRESSION: No acute abnormality abdomen or pelvis. No finding to explain the patient's symptoms. Fatty infiltration of the liver. Status post cholecystectomy and hysterectomy. Aortoiliac atherosclerosis without aneurysm. Electronically Signed   By: Inge Rise M.D.   On: 03/17/2016 10:12   I have personally reviewed and evaluated these images and lab results as part of my medical decision-making.   EKG Interpretation None      MDM   Final diagnoses:  Abdominal pain, unspecified abdominal location    No acute abdomen. White count was normal. CT scan shows no acute abdominal or pelvic pathology. IV fluids, pain management. Discharge medications Percocet and Zofran 8 mg.    Nat Christen, MD 03/17/16 1259

## 2016-07-30 ENCOUNTER — Ambulatory Visit: Payer: Self-pay | Admitting: Pediatrics

## 2016-08-08 ENCOUNTER — Emergency Department (HOSPITAL_COMMUNITY): Payer: Medicare Other

## 2016-08-08 ENCOUNTER — Encounter (HOSPITAL_COMMUNITY): Payer: Self-pay | Admitting: *Deleted

## 2016-08-08 ENCOUNTER — Emergency Department (HOSPITAL_COMMUNITY)
Admission: EM | Admit: 2016-08-08 | Discharge: 2016-08-08 | Disposition: A | Discharge status: Home / Self Care | Payer: Medicare Other | Attending: Emergency Medicine | Admitting: Emergency Medicine

## 2016-08-08 DIAGNOSIS — R52 Pain, unspecified: Secondary | ICD-10-CM

## 2016-08-08 DIAGNOSIS — F17210 Nicotine dependence, cigarettes, uncomplicated: Secondary | ICD-10-CM | POA: Insufficient documentation

## 2016-08-08 DIAGNOSIS — K2970 Gastritis, unspecified, without bleeding: Secondary | ICD-10-CM

## 2016-08-08 DIAGNOSIS — Z79899 Other long term (current) drug therapy: Secondary | ICD-10-CM | POA: Insufficient documentation

## 2016-08-08 DIAGNOSIS — R079 Chest pain, unspecified: Secondary | ICD-10-CM | POA: Diagnosis not present

## 2016-08-08 DIAGNOSIS — E039 Hypothyroidism, unspecified: Secondary | ICD-10-CM | POA: Insufficient documentation

## 2016-08-08 DIAGNOSIS — R109 Unspecified abdominal pain: Secondary | ICD-10-CM | POA: Diagnosis not present

## 2016-08-08 DIAGNOSIS — R1013 Epigastric pain: Secondary | ICD-10-CM | POA: Diagnosis present

## 2016-08-08 DIAGNOSIS — R110 Nausea: Secondary | ICD-10-CM | POA: Diagnosis not present

## 2016-08-08 LAB — URINALYSIS, ROUTINE W REFLEX MICROSCOPIC
Bilirubin Urine: NEGATIVE
Glucose, UA: NEGATIVE mg/dL
Ketones, ur: NEGATIVE mg/dL
LEUKOCYTES UA: NEGATIVE
NITRITE: NEGATIVE
PH: 6 (ref 5.0–8.0)
SPECIFIC GRAVITY, URINE: 1.02 (ref 1.005–1.030)

## 2016-08-08 LAB — COMPREHENSIVE METABOLIC PANEL
ALBUMIN: 4 g/dL (ref 3.5–5.0)
ALT: 11 U/L — ABNORMAL LOW (ref 14–54)
ANION GAP: 11 (ref 5–15)
AST: 15 U/L (ref 15–41)
Alkaline Phosphatase: 103 U/L (ref 38–126)
BILIRUBIN TOTAL: 0.3 mg/dL (ref 0.3–1.2)
BUN: 10 mg/dL (ref 6–20)
CALCIUM: 9.8 mg/dL (ref 8.9–10.3)
CO2: 25 mmol/L (ref 22–32)
Chloride: 103 mmol/L (ref 101–111)
Creatinine, Ser: 0.8 mg/dL (ref 0.44–1.00)
GFR calc non Af Amer: >60 mL/min (ref >60)
GLUCOSE: 126 mg/dL — AB (ref 65–99)
POTASSIUM: 3.6 mmol/L (ref 3.5–5.1)
Sodium: 139 mmol/L (ref 135–145)
TOTAL PROTEIN: 7.9 g/dL (ref 6.5–8.1)

## 2016-08-08 LAB — CBC
HEMATOCRIT: 47.1 % — AB (ref 36.0–46.0)
HEMOGLOBIN: 16.2 g/dL — AB (ref 12.0–15.0)
MCH: 32.5 pg (ref 26.0–34.0)
MCHC: 34.4 g/dL (ref 30.0–36.0)
MCV: 94.4 fL (ref 78.0–100.0)
Platelets: 200 10*3/uL (ref 150–400)
RBC: 4.99 MIL/uL (ref 3.87–5.11)
RDW: 13.2 % (ref 11.5–15.5)
WBC: 8.5 10*3/uL (ref 4.0–10.5)

## 2016-08-08 LAB — URINE MICROSCOPIC-ADD ON

## 2016-08-08 LAB — LIPASE, BLOOD: Lipase: 27 U/L (ref 11–51)

## 2016-08-08 LAB — TROPONIN I

## 2016-08-08 MED ORDER — ONDANSETRON HCL 4 MG/2ML IJ SOLN
4.0000 mg | Freq: Once | INTRAMUSCULAR | Status: AC
  Administered 2016-08-08: 4 mg via INTRAVENOUS
  Filled 2016-08-08: qty 2

## 2016-08-08 MED ORDER — HYDROMORPHONE HCL 1 MG/ML IJ SOLN
1.0000 mg | Freq: Once | INTRAMUSCULAR | Status: AC
  Administered 2016-08-08: 1 mg via INTRAVENOUS
  Filled 2016-08-08: qty 1

## 2016-08-08 MED ORDER — FAMOTIDINE IN NACL 20-0.9 MG/50ML-% IV SOLN
20.0000 mg | Freq: Once | INTRAVENOUS | Status: AC
  Administered 2016-08-08: 20 mg via INTRAVENOUS
  Filled 2016-08-08: qty 50

## 2016-08-08 MED ORDER — GI COCKTAIL ~~LOC~~
30.0000 mL | Freq: Once | ORAL | Status: AC
  Administered 2016-08-08: 30 mL via ORAL
  Filled 2016-08-08: qty 30

## 2016-08-08 MED ORDER — SODIUM CHLORIDE 0.9 % IV BOLUS (SEPSIS)
500.0000 mL | Freq: Once | INTRAVENOUS | Status: AC
  Administered 2016-08-08: 500 mL via INTRAVENOUS

## 2016-08-08 MED ORDER — HYDROMORPHONE HCL 4 MG PO TABS: 4.0000 mg | ORAL_TABLET | Freq: Four times a day (QID) | ORAL | 0 refills | Status: DC | PRN

## 2016-08-08 MED ORDER — PANTOPRAZOLE SODIUM 20 MG PO TBEC: 20.0000 mg | DELAYED_RELEASE_TABLET | Freq: Every day | ORAL | 0 refills | Status: DC

## 2016-08-08 NOTE — ED Triage Notes (Signed)
Pt called EMS for abdominal pain. Denies any n/v/d. This pain started 1 week ago. She has hx of peptic ulcer.   On the way here pt began having central chest pain.

## 2016-08-08 NOTE — Discharge Instructions (Signed)
Follow-up with Dr. Oneida Alar in 1-2 weeks

## 2016-08-08 NOTE — ED Provider Notes (Signed)
[facility] DEPT Provider Note   CSN: [id number] Arrival date & time: 08/08/16  1708     History   Chief Complaint Chief Complaint  Patient presents with  . Abdominal Pain    HPI Deborah Russo is a 55 y.o. female.  Patient complains of epigastric abdominal pain. Patient states she's had ulcers before   The history is provided by the patient. No language interpreter was used.  Abdominal Pain   This is a new problem. The problem occurs hourly. The problem has not changed since onset.Associated with: Nothing. The pain is located in the epigastric region. The pain is at a severity of 6/10. The pain is moderate. Associated symptoms include anorexia. Pertinent negatives include diarrhea, frequency, hematuria and headaches. Nothing aggravates the symptoms. Nothing relieves the symptoms. Past workup includes GI consult. Her past medical history is significant for PUD.    Past Medical History:  Diagnosis Date  . Chronic abdominal pain    since approximately 1991  . Chronic migraine   . Chronic pain   . Hypothyroidism   . Nausea, vomiting, and diarrhea    recurrent, chronic  . Pain management   . Sleep apnea    has CPAP, but doesn't wear it bc she says "i can't sleep with it on"    Patient Active Problem List   Diagnosis Date Noted  . H/O Clostridium difficile infection 07/13/2014  . Diarrhea 07/13/2014  . FTT (failure to thrive) in adult 07/13/2014  . Abdominal pain, unspecified site 07/13/2014  . Chronic abdominal pain   . Abdominal pain 06/19/2014  . Hypothyroidism 06/19/2014  . Hypokalemia 06/19/2014  . Chronic pain 06/19/2014  . Anemia 06/19/2014  . Abscess of axilla, left 03/28/2013    Past Surgical History:  Procedure Laterality Date  . ABDOMINAL HYSTERECTOMY    . ABDOMINAL SURGERY    . BIOPSY N/A 07/17/2014   Procedure: BIOPSY;  Surgeon: Rogene Houston, MD;  Location: [facility];  Service: Endoscopy;  Laterality: N/A;  . CESAREAN SECTION    .  CHOLECYSTECTOMY    . ESOPHAGOGASTRODUODENOSCOPY N/A 07/17/2014   Procedure: ESOPHAGOGASTRODUODENOSCOPY (EGD);  Surgeon: Rogene Houston, MD;  Location: [facility];  Service: Endoscopy;  Laterality: N/A;    OB History    Gravida Para Term Preterm AB Living   2 1 1   1      SAB TAB Ectopic Multiple Live Births     1             Home Medications    Prior to Admission medications   Medication Sig Start Date End Date Taking? Authorizing Provider  diazepam (VALIUM) 5 MG tablet Take 5 mg by mouth 3 (three) times daily.   Yes Historical Provider, MD  ibuprofen (ADVIL,MOTRIN) 200 MG tablet Take 200 mg by mouth every 6 (six) hours as needed for moderate pain.   Yes Historical Provider, MD  levothyroxine (SYNTHROID, LEVOTHROID) 175 MCG tablet Take 1 tablet (175 mcg total) by mouth daily before breakfast. 02/19/15  Yes Tanna Furry, MD  OxyCODONE (OXYCONTIN) 80 mg T12A 12 hr tablet Take 1 tablet (80 mg total) by mouth every 12 (twelve) hours. 07/17/14  Yes Nita Sells, MD  SUMAtriptan (IMITREX) 100 MG tablet Take 100 mg by mouth every 2 (two) hours as needed for migraine.   Yes Historical Provider, MD  HYDROmorphone (DILAUDID) 4 MG tablet Take 1 tablet (4 mg total) by mouth every 6 (six) hours as needed for severe pain. 08/08/16   Milton Ferguson,  MD  pantoprazole (PROTONIX) 20 MG tablet Take 1 tablet (20 mg total) by mouth daily. 08/08/16   Milton Ferguson, MD    Family History Family History  Problem Relation Age of Onset  . Asthma Mother   . Cancer Father     Social History Social History  Substance Use Topics  . Smoking status: Current Every Day Smoker    Packs/day: 0.50    Years: 15.00    Types: Cigarettes  . Smokeless tobacco: Never Used  . Alcohol use No     Allergies   Penicillins and Sulfa antibiotics   Review of Systems Review of Systems  Constitutional: Negative for appetite change and fatigue.  HENT: Negative for congestion, ear discharge and sinus pressure.   Eyes:  Negative for discharge.  Respiratory: Negative for cough.   Cardiovascular: Negative for chest pain.  Gastrointestinal: Positive for abdominal pain and anorexia. Negative for diarrhea.  Genitourinary: Negative for frequency and hematuria.  Musculoskeletal: Negative for back pain.  Skin: Negative for rash.  Neurological: Negative for seizures and headaches.  Psychiatric/Behavioral: Negative for hallucinations.     Physical Exam Updated Vital Signs BP 136/95   Pulse 82   Temp 98.2 F (36.8 C) (Oral)   Resp 18   Ht 5' 2"  (1.575 m)   Wt 190 lb (86.2 kg)   SpO2 99%   BMI 34.75 kg/m   Physical Exam  Constitutional: She is oriented to person, place, and time. She appears well-developed.  HENT:  Head: Normocephalic.  Eyes: Conjunctivae and EOM are normal. No scleral icterus.  Neck: Neck supple. No thyromegaly present.  Cardiovascular: Normal rate and regular rhythm.  Exam reveals no gallop and no friction rub.   No murmur heard. Pulmonary/Chest: No stridor. She has no wheezes. She has no rales. She exhibits no tenderness.  Abdominal: She exhibits no distension. There is no tenderness. There is no rebound.  Tender epigastric  Musculoskeletal: Normal range of motion. She exhibits no edema.  Lymphadenopathy:    She has no cervical adenopathy.  Neurological: She is oriented to person, place, and time. She exhibits normal muscle tone. Coordination normal.  Skin: No rash noted. No erythema.  Psychiatric: She has a normal mood and affect. Her behavior is normal.     ED Treatments / Results  Labs (all labs ordered are listed, but only abnormal results are displayed) Labs Reviewed  COMPREHENSIVE METABOLIC PANEL - Abnormal; Notable for the following:       Result Value   Glucose, Bld 126 (*)    ALT 11 (*)    All other components within normal limits  CBC - Abnormal; Notable for the following:    Hemoglobin 16.2 (*)    HCT 47.1 (*)    All other components within normal limits    URINALYSIS, ROUTINE W REFLEX MICROSCOPIC (NOT AT [facility]) - Abnormal; Notable for the following:    Hgb urine dipstick SMALL (*)    Protein, ur TRACE (*)    All other components within normal limits  URINE MICROSCOPIC-ADD ON - Abnormal; Notable for the following:    Squamous Epithelial / LPF 6-30 (*)    Bacteria, UA FEW (*)    All other components within normal limits  LIPASE, BLOOD  TROPONIN I    EKG  EKG Interpretation None       Radiology Dg Chest 2 View  Result Date: 08/08/2016 CLINICAL DATA:  Severe abdominal pain beginning 1 week ago. Chest pain developing today. EXAM: CHEST  2  VIEW COMPARISON:  09/02/2015 FINDINGS: Artifact overlies the chest. Heart size is normal. Aortic atherosclerosis. The lungs are clear. The vascularity is normal. No effusions. No significant bone finding. No free air under the diaphragm. IMPRESSION: No active disease.  Aortic atherosclerosis. Electronically Signed   By: Nelson Chimes M.D.   On: 08/08/2016 17:59   Dg Abd 2 Views  Result Date: 08/08/2016 CLINICAL DATA:  Abdominal pain.  History of peptic ulcer disease EXAM: ABDOMEN - 2 VIEW COMPARISON:  03/17/2016 abdominal CT FINDINGS: The bowel gas pattern is normal. There is no evidence of free air. No radio-opaque calculi or other significant radiographic abnormality is seen. Cholecystectomy clips. Atherosclerotic calcification. Clear lung bases. IMPRESSION: 1. Normal bowel gas pattern.  Negative for pneumoperitoneum. 2.  Aortic Atherosclerosis (ICD10-170.0) Electronically Signed   By: Monte Fantasia M.D.   On: 08/08/2016 18:54    Procedures Procedures (including critical care time)  Medications Ordered in ED Medications  famotidine (PEPCID) IVPB 20 mg premix (0 mg Intravenous Stopped 08/08/16 1856)  ondansetron (ZOFRAN) injection 4 mg (4 mg Intravenous Given 08/08/16 1816)  HYDROmorphone (DILAUDID) injection 1 mg (1 mg Intravenous Given 08/08/16 1817)  gi cocktail (Maalox,Lidocaine,Donnatal) (30 mLs  Oral Given 08/08/16 1817)  sodium chloride 0.9 % bolus 500 mL (500 mLs Intravenous New Bag/Given 08/08/16 1817)  ondansetron (ZOFRAN) injection 4 mg (4 mg Intravenous Given 08/08/16 2040)  HYDROmorphone (DILAUDID) injection 1 mg (1 mg Intravenous Given 08/08/16 2041)     Initial Impression / Assessment and Plan / ED Course  I have reviewed the triage vital signs and the nursing notes.  Pertinent labs & imaging results that were available during Deborah care of the patient were reviewed by me and considered in Deborah medical decision making (see chart for details).  Clinical Course    Patient with abdominal pain consistent with peptic ulcer problems. She will be put on protonic and is referred to GI  Final Clinical Impressions(s) / ED Diagnoses   Final diagnoses:  Gastritis    New Prescriptions New Prescriptions   HYDROMORPHONE (DILAUDID) 4 MG TABLET    Take 1 tablet (4 mg total) by mouth every 6 (six) hours as needed for severe pain.   PANTOPRAZOLE (PROTONIX) 20 MG TABLET    Take 1 tablet (20 mg total) by mouth daily.     Milton Ferguson, MD 08/08/16 2209

## 2016-08-10 DIAGNOSIS — L03031 Cellulitis of right toe: Secondary | ICD-10-CM | POA: Diagnosis not present

## 2016-08-10 DIAGNOSIS — B351 Tinea unguium: Secondary | ICD-10-CM | POA: Diagnosis not present

## 2016-08-30 ENCOUNTER — Ambulatory Visit: Payer: Self-pay | Admitting: Pediatrics

## 2016-08-31 ENCOUNTER — Encounter: Payer: Self-pay | Admitting: Pediatrics

## 2016-09-02 ENCOUNTER — Telehealth: Payer: Self-pay | Admitting: General Practice

## 2016-09-02 NOTE — Telephone Encounter (Signed)
Spoke with pt and advised we don't have any openings tomorrow but offered appt Monday with Dr.Stacks at 3:25 and pt accepted and aware to arrive 25 minutes prior.

## 2016-09-06 ENCOUNTER — Ambulatory Visit: Payer: Self-pay | Admitting: Family Medicine

## 2016-09-12 ENCOUNTER — Emergency Department (HOSPITAL_COMMUNITY)
Admission: EM | Admit: 2016-09-12 | Discharge: 2016-09-12 | Disposition: A | Discharge status: Home / Self Care | Payer: Medicare Other | Attending: Emergency Medicine | Admitting: Emergency Medicine

## 2016-09-12 ENCOUNTER — Encounter (HOSPITAL_COMMUNITY): Payer: Self-pay

## 2016-09-12 DIAGNOSIS — R112 Nausea with vomiting, unspecified: Secondary | ICD-10-CM | POA: Diagnosis not present

## 2016-09-12 DIAGNOSIS — R109 Unspecified abdominal pain: Secondary | ICD-10-CM

## 2016-09-12 DIAGNOSIS — E039 Hypothyroidism, unspecified: Secondary | ICD-10-CM | POA: Insufficient documentation

## 2016-09-12 DIAGNOSIS — R10812 Left upper quadrant abdominal tenderness: Secondary | ICD-10-CM | POA: Diagnosis not present

## 2016-09-12 DIAGNOSIS — R1111 Vomiting without nausea: Secondary | ICD-10-CM | POA: Diagnosis not present

## 2016-09-12 DIAGNOSIS — Z79899 Other long term (current) drug therapy: Secondary | ICD-10-CM | POA: Insufficient documentation

## 2016-09-12 DIAGNOSIS — K2970 Gastritis, unspecified, without bleeding: Secondary | ICD-10-CM | POA: Diagnosis not present

## 2016-09-12 DIAGNOSIS — F17210 Nicotine dependence, cigarettes, uncomplicated: Secondary | ICD-10-CM | POA: Diagnosis not present

## 2016-09-12 DIAGNOSIS — R1013 Epigastric pain: Secondary | ICD-10-CM | POA: Diagnosis not present

## 2016-09-12 DIAGNOSIS — R197 Diarrhea, unspecified: Secondary | ICD-10-CM | POA: Insufficient documentation

## 2016-09-12 DIAGNOSIS — Z791 Long term (current) use of non-steroidal anti-inflammatories (NSAID): Secondary | ICD-10-CM | POA: Diagnosis not present

## 2016-09-12 DIAGNOSIS — R110 Nausea: Secondary | ICD-10-CM | POA: Diagnosis not present

## 2016-09-12 LAB — URINALYSIS, ROUTINE W REFLEX MICROSCOPIC
BILIRUBIN URINE: NEGATIVE
GLUCOSE, UA: NEGATIVE mg/dL
KETONES UR: NEGATIVE mg/dL
LEUKOCYTES UA: NEGATIVE
Nitrite: NEGATIVE
PROTEIN: NEGATIVE mg/dL
Specific Gravity, Urine: <1.005 — ABNORMAL LOW (ref 1.005–1.030)
pH: 6.5 (ref 5.0–8.0)

## 2016-09-12 LAB — CBC WITH DIFFERENTIAL/PLATELET
Basophils Absolute: 0.1 10*3/uL (ref 0.0–0.1)
Basophils Relative: 1 %
Eosinophils Absolute: 0.1 10*3/uL (ref 0.0–0.7)
Eosinophils Relative: 1 %
HEMATOCRIT: 51.7 % — AB (ref 36.0–46.0)
Hemoglobin: 17.6 g/dL — ABNORMAL HIGH (ref 12.0–15.0)
LYMPHS PCT: 36 %
Lymphs Abs: 2.8 10*3/uL (ref 0.7–4.0)
MCH: 32.1 pg (ref 26.0–34.0)
MCHC: 34 g/dL (ref 30.0–36.0)
MCV: 94.2 fL (ref 78.0–100.0)
MONO ABS: 0.3 10*3/uL (ref 0.1–1.0)
MONOS PCT: 4 %
NEUTROS ABS: 4.6 10*3/uL (ref 1.7–7.7)
Neutrophils Relative %: 58 %
Platelets: 136 10*3/uL — ABNORMAL LOW (ref 150–400)
RBC: 5.49 MIL/uL — ABNORMAL HIGH (ref 3.87–5.11)
RDW: 14.6 % (ref 11.5–15.5)
WBC: 7.9 10*3/uL (ref 4.0–10.5)

## 2016-09-12 LAB — URINE MICROSCOPIC-ADD ON
Bacteria, UA: NONE SEEN
WBC, UA: NONE SEEN WBC/hpf (ref 0–5)

## 2016-09-12 LAB — COMPREHENSIVE METABOLIC PANEL WITH GFR
ALT: 12 U/L — ABNORMAL LOW (ref 14–54)
AST: 19 U/L (ref 15–41)
Albumin: 4.4 g/dL (ref 3.5–5.0)
Alkaline Phosphatase: 110 U/L (ref 38–126)
Anion gap: 8 (ref 5–15)
BUN: 7 mg/dL (ref 6–20)
CO2: 28 mmol/L (ref 22–32)
Calcium: 9.7 mg/dL (ref 8.9–10.3)
Chloride: 100 mmol/L — ABNORMAL LOW (ref 101–111)
Creatinine, Ser: 1.02 mg/dL — ABNORMAL HIGH (ref 0.44–1.00)
GFR calc Af Amer: >60 mL/min
GFR calc non Af Amer: 60 mL/min — ABNORMAL LOW
Glucose, Bld: 119 mg/dL — ABNORMAL HIGH (ref 65–99)
Potassium: 4 mmol/L (ref 3.5–5.1)
Sodium: 136 mmol/L (ref 135–145)
Total Bilirubin: 0.5 mg/dL (ref 0.3–1.2)
Total Protein: 8.3 g/dL — ABNORMAL HIGH (ref 6.5–8.1)

## 2016-09-12 LAB — LIPASE, BLOOD: Lipase: 36 U/L (ref 11–51)

## 2016-09-12 MED ORDER — SODIUM CHLORIDE 0.9 % IV BOLUS (SEPSIS)
1000.0000 mL | Freq: Once | INTRAVENOUS | Status: AC
  Administered 2016-09-12: 1000 mL via INTRAVENOUS

## 2016-09-12 MED ORDER — PROMETHAZINE HCL 25 MG PO TABS: 25.0000 mg | ORAL_TABLET | Freq: Four times a day (QID) | ORAL | 0 refills | Status: DC | PRN

## 2016-09-12 MED ORDER — MORPHINE SULFATE (PF) 4 MG/ML IV SOLN
4.0000 mg | Freq: Once | INTRAVENOUS | Status: AC
  Administered 2016-09-12: 4 mg via INTRAVENOUS
  Filled 2016-09-12: qty 1

## 2016-09-12 MED ORDER — PANTOPRAZOLE SODIUM 40 MG IV SOLR
40.0000 mg | Freq: Once | INTRAVENOUS | Status: AC
  Administered 2016-09-12: 40 mg via INTRAVENOUS
  Filled 2016-09-12: qty 40

## 2016-09-12 MED ORDER — OXYCODONE-ACETAMINOPHEN 5-325 MG PO TABS
1.0000 | ORAL_TABLET | ORAL | 0 refills | Status: DC | PRN
Start: 2016-09-12 — End: 2016-09-29

## 2016-09-12 MED ORDER — ONDANSETRON HCL 4 MG/2ML IJ SOLN
4.0000 mg | Freq: Once | INTRAMUSCULAR | Status: AC
  Administered 2016-09-12: 4 mg via INTRAVENOUS
  Filled 2016-09-12: qty 2

## 2016-09-12 NOTE — Discharge Instructions (Signed)
Tests show no life-threatening condition. Medication for pain and nausea. Follow-up your doctor.

## 2016-09-12 NOTE — ED Triage Notes (Signed)
Complain of upper abdominal pain for two months. Also, some n/v/d off and on.

## 2016-09-12 NOTE — ED Provider Notes (Signed)
[facility] DEPT Provider Note   CSN: [id number] Arrival date & time: 09/12/16  0807  By signing my name below, I, Deborah Russo, attest that this documentation has been prepared under the direction and in the presence of Nat Christen, MD . Electronically Signed: Dolores Russo, Scribe. 09/12/2016. 8:42 AM.  History   Chief Complaint Chief Complaint  Patient presents with  . Abdominal Pain   The history is provided by the patient. No language interpreter was used.   HPI Comments:  Deborah Russo is a 55 y.o. female who presents to the Emergency Department complaining of constant worsening central epigastric pain beginning 2 months ago. Pt describes her pain as radiating through her abdomen and into her back. She states her current pain feels identical to pain she experienced when she had ulcers in the past. Pt endorses associated vomiting, nausea, watery diarrhea, weight loss (~20lbs in 4 months), and chills. She denies any dysuria or urinary retention. Pt has an appointment with Gastrologist in 3 weeks but states her pain has worsened that she feels she can't wait 3 weeks to be seen. She has a PSHx of exploratory lap occurring in 80.   Past Medical History:  Diagnosis Date  . Chronic abdominal pain    since approximately 1991  . Chronic migraine   . Chronic pain   . Hypothyroidism   . Nausea, vomiting, and diarrhea    recurrent, chronic  . Pain management   . Sleep apnea    has CPAP, but doesn't wear it bc she says "i can't sleep with it on"    Patient Active Problem List   Diagnosis Date Noted  . H/O Clostridium difficile infection 07/13/2014  . Diarrhea 07/13/2014  . FTT (failure to thrive) in adult 07/13/2014  . Abdominal pain, unspecified site 07/13/2014  . Chronic abdominal pain   . Abdominal pain 06/19/2014  . Hypothyroidism 06/19/2014  . Hypokalemia 06/19/2014  . Chronic pain 06/19/2014  . Anemia 06/19/2014  . Abscess of axilla, left 03/28/2013    Past  Surgical History:  Procedure Laterality Date  . ABDOMINAL HYSTERECTOMY    . ABDOMINAL SURGERY    . BIOPSY N/A 07/17/2014   Procedure: BIOPSY;  Surgeon: Rogene Houston, MD;  Location: [facility];  Service: Endoscopy;  Laterality: N/A;  . CESAREAN SECTION    . CHOLECYSTECTOMY    . ESOPHAGOGASTRODUODENOSCOPY N/A 07/17/2014   Procedure: ESOPHAGOGASTRODUODENOSCOPY (EGD);  Surgeon: Rogene Houston, MD;  Location: [facility];  Service: Endoscopy;  Laterality: N/A;    OB History    Gravida Para Term Preterm AB Living   2 1 1   1      SAB TAB Ectopic Multiple Live Births     1             Home Medications    Prior to Admission medications   Medication Sig Start Date End Date Taking? Authorizing Provider  diazepam (VALIUM) 5 MG tablet Take 5 mg by mouth 3 (three) times daily.   Yes Historical Provider, MD  ibuprofen (ADVIL,MOTRIN) 200 MG tablet Take 200 mg by mouth every 6 (six) hours as needed for moderate pain.   Yes Historical Provider, MD  levothyroxine (SYNTHROID, LEVOTHROID) 175 MCG tablet Take 1 tablet (175 mcg total) by mouth daily before breakfast. 02/19/15  Yes Tanna Furry, MD  OxyCODONE (OXYCONTIN) 80 mg T12A 12 hr tablet Take 1 tablet (80 mg total) by mouth every 12 (twelve) hours. 07/17/14  Yes Nita Sells, MD  SUMAtriptan Dellis Filbert)  100 MG tablet Take 100 mg by mouth every 2 (two) hours as needed for migraine.   Yes Historical Provider, MD  HYDROmorphone (DILAUDID) 4 MG tablet Take 1 tablet (4 mg total) by mouth every 6 (six) hours as needed for severe pain. Patient not taking: Reported on 09/12/2016 08/08/16   Milton Ferguson, MD  oxyCODONE-acetaminophen (PERCOCET/ROXICET) 5-325 MG tablet Take 1-2 tablets by mouth every 4 (four) hours as needed for severe pain. 09/12/16   Nat Christen, MD  pantoprazole (PROTONIX) 20 MG tablet Take 1 tablet (20 mg total) by mouth daily. Patient not taking: Reported on 09/12/2016 08/08/16   Milton Ferguson, MD  promethazine (PHENERGAN) 25 MG tablet Take 1 tablet  (25 mg total) by mouth every 6 (six) hours as needed. 09/12/16   Nat Christen, MD    Family History Family History  Problem Relation Age of Onset  . Asthma Mother   . Cancer Father     Social History Social History  Substance Use Topics  . Smoking status: Current Every Day Smoker    Packs/day: 0.50    Years: 15.00    Types: Cigarettes  . Smokeless tobacco: Never Used  . Alcohol use No     Allergies   Penicillins and Sulfa antibiotics   Review of Systems Review of Systems  Constitutional: Positive for chills and unexpected weight change.  Gastrointestinal: Positive for abdominal pain, diarrhea, nausea and vomiting.  Genitourinary: Negative for decreased urine volume and dysuria.  All other systems reviewed and are negative.    Physical Exam Updated Vital Signs BP 126/74 (BP Location: Right Arm)   Pulse 78   Temp 97.9 F (36.6 C) (Oral)   Resp 17   Ht 5' 2"  (1.575 m)   Wt 180 lb (81.6 kg)   SpO2 100%   BMI 32.92 kg/m   Physical Exam  Constitutional: She is oriented to person, place, and time. She appears well-developed and well-nourished.  HENT:  Head: Normocephalic and atraumatic.  Eyes: Conjunctivae are normal.  Neck: Neck supple.  Cardiovascular: Normal rate and regular rhythm.   Pulmonary/Chest: Effort normal and breath sounds normal.  Abdominal: Soft. There is tenderness.  Tenderness in epigastrium. Old vertical surgical scar on abdomen.   Musculoskeletal: Normal range of motion.  Neurological: She is alert and oriented to person, place, and time.  Skin: Skin is warm and dry.  Psychiatric: She has a normal mood and affect. Her behavior is normal.  Nursing note and vitals reviewed.    ED Treatments / Results  DIAGNOSTIC STUDIES:  Oxygen Saturation is 94% on RA, normal by my interpretation.    Labs (all labs ordered are listed, but only abnormal results are displayed) Labs Reviewed  CBC WITH DIFFERENTIAL/PLATELET - Abnormal; Notable for the  following:       Result Value   RBC 5.49 (*)    Hemoglobin 17.6 (*)    HCT 51.7 (*)    Platelets 136 (*)    All other components within normal limits  COMPREHENSIVE METABOLIC PANEL - Abnormal; Notable for the following:    Chloride 100 (*)    Glucose, Bld 119 (*)    Creatinine, Ser 1.02 (*)    Total Protein 8.3 (*)    ALT 12 (*)    GFR calc non Af Amer 60 (*)    All other components within normal limits  URINALYSIS, ROUTINE W REFLEX MICROSCOPIC (NOT AT [facility] - Abnormal; Notable for the following:    Specific Gravity, Urine <1.005 (*)  Hgb urine dipstick TRACE (*)    All other components within normal limits  URINE MICROSCOPIC-ADD ON - Abnormal; Notable for the following:    Squamous Epithelial / LPF 0-5 (*)    All other components within normal limits  LIPASE, BLOOD    EKG  EKG Interpretation None       Radiology No results found.  Procedures Procedures (including critical care time)  Medications Ordered in ED Medications  ondansetron (ZOFRAN) injection 4 mg (4 mg Intravenous Given 09/12/16 0917)  sodium chloride 0.9 % bolus 1,000 mL (0 mLs Intravenous Stopped 09/12/16 0955)  morphine 4 MG/ML injection 4 mg (4 mg Intravenous Given 09/12/16 0917)  sodium chloride 0.9 % bolus 1,000 mL (0 mLs Intravenous Stopped 09/12/16 1058)  pantoprazole (PROTONIX) injection 40 mg (40 mg Intravenous Given 09/12/16 0917)  morphine 4 MG/ML injection 4 mg (4 mg Intravenous Given 09/12/16 1019)     Initial Impression / Assessment and Plan / ED Course  I have reviewed the triage vital signs and the nursing notes.  Pertinent labs & imaging results that were available during my care of the patient were reviewed by me and considered in my medical decision making (see chart for details).  Clinical Course   COORDINATION OF CARE:  8:45 AM Discussed treatment plan with pt at bedside which included IV fluids, basic labs, and anti-ulcer medicationand pt agreed to plan.  No acute abdomen  noted. Screening labs show no worrisome results. Patient has had intermittent abdominal pain for multiple years. Discharge medications Percocet and Phenergan 25 mg  Final Clinical Impressions(s) / ED Diagnoses   Final diagnoses:  Abdominal pain, unspecified abdominal location    New Prescriptions New Prescriptions   OXYCODONE-ACETAMINOPHEN (PERCOCET/ROXICET) 5-325 MG TABLET    Take 1-2 tablets by mouth every 4 (four) hours as needed for severe pain.   PROMETHAZINE (PHENERGAN) 25 MG TABLET    Take 1 tablet (25 mg total) by mouth every 6 (six) hours as needed.  I personally performed the services described in this documentation, which was scribed in my presence. The recorded information has been reviewed and is accurate.      Nat Christen, MD 09/12/16 1320

## 2016-09-29 ENCOUNTER — Encounter: Payer: Self-pay | Admitting: Pediatrics

## 2016-09-29 ENCOUNTER — Ambulatory Visit (INDEPENDENT_AMBULATORY_CARE_PROVIDER_SITE_OTHER): Payer: Medicare Other | Admitting: Pediatrics

## 2016-09-29 VITALS — BP 171/105 | HR 94 | Temp 97.8°F | Ht 62.0 in | Wt 177.6 lb

## 2016-09-29 DIAGNOSIS — R109 Unspecified abdominal pain: Secondary | ICD-10-CM

## 2016-09-29 DIAGNOSIS — I700 Atherosclerosis of aorta: Secondary | ICD-10-CM

## 2016-09-29 DIAGNOSIS — E039 Hypothyroidism, unspecified: Secondary | ICD-10-CM | POA: Diagnosis not present

## 2016-09-29 DIAGNOSIS — I10 Essential (primary) hypertension: Secondary | ICD-10-CM

## 2016-09-29 DIAGNOSIS — IMO0002 Reserved for concepts with insufficient information to code with codable children: Secondary | ICD-10-CM | POA: Insufficient documentation

## 2016-09-29 DIAGNOSIS — G43709 Chronic migraine without aura, not intractable, without status migrainosus: Secondary | ICD-10-CM | POA: Diagnosis not present

## 2016-09-29 DIAGNOSIS — G8929 Other chronic pain: Secondary | ICD-10-CM | POA: Diagnosis not present

## 2016-09-29 MED ORDER — LEVOTHYROXINE SODIUM 175 MCG PO TABS: 175.0000 ug | ORAL_TABLET | Freq: Every day | ORAL | 3 refills | Status: DC

## 2016-09-29 MED ORDER — PRAVASTATIN SODIUM 20 MG PO TABS: 20.0000 mg | ORAL_TABLET | Freq: Every day | ORAL | 3 refills | Status: DC

## 2016-09-29 MED ORDER — LISINOPRIL 10 MG PO TABS: 10.0000 mg | ORAL_TABLET | Freq: Every day | ORAL | 3 refills | Status: DC

## 2016-09-29 MED ORDER — OMEPRAZOLE 20 MG PO CPDR: 20.0000 mg | DELAYED_RELEASE_CAPSULE | Freq: Every day | ORAL | 3 refills | Status: DC

## 2016-09-29 MED ORDER — SUCRALFATE 1 G PO TABS: 1.0000 g | ORAL_TABLET | Freq: Three times a day (TID) | ORAL | 1 refills | Status: DC | PRN

## 2016-09-29 NOTE — Patient Instructions (Addendum)
Take omeprazole every morning for acid suppression  Take the sucralfate tabs before meals as needed for pain  I put in the referral to GI

## 2016-09-29 NOTE — Progress Notes (Signed)
Subjective:   Patient ID: Deborah Russo, female    DOB: 05/11/1961, 55 y.o.   MRN: [id number] CC: New Patient (Initial Visit)  HPI: Deborah Russo is a 55 y.o. female presenting for New Patient (Initial Visit)  HTN: Has headaches every day from migraines Rarely has achey feeling in chest Has had negative troponins  Chronic migraine: Followed by Dr. Beatris Si Headaches start throughout head, different places Takes oxycontin BID for headaches  Chronic pain:  Has had rib cage pain from nerve damage from pregnancy  Abd pain:  Worsening over last few weeks to months mulitple ED visits Eating makes epigastric pain worse Feels like a burning pain Always there, but sometimes gets worse, eating makes it worse Says she eats once a week, not able to eat more often because of the pain, drinking sprite or water daily Regular urination Has had gall bladder removed Has had exploratory lap to eval for pain source in over 20 yrs ago S/p hysterectomy Last had EGD years ago No EtOH use Does take ibuprofen when her stomach hurts Daily bowel movements, even when she eats rarely No blood in stools Says last ate 2-3 days ago  Hypothyroidism: Ran out two weeks ago  Tobacco use: Current daily smoker, about 1ppd, used to be 2.5 ppd Still cutting back  Past Medical History:  Diagnosis Date  . Chronic abdominal pain    since approximately 1991  . Chronic migraine   . Chronic pain   . Hypothyroidism   . Nausea, vomiting, and diarrhea    recurrent, chronic  . Pain management   . Sleep apnea    has CPAP, but doesn't wear it bc she says "i can't sleep with it on"   Family History  Problem Relation Age of Onset  . Asthma Mother   . Cancer Father    Social History   Social History  . Marital status: Divorced    Spouse name: N/A  . Number of children: N/A  . Years of education: N/A   Social History Main Topics  . Smoking status: Current Every Day Smoker    Packs/day: 1.00     Years: 41.00    Types: Cigarettes  . Smokeless tobacco: Never Used  . Alcohol use No  . Drug use: No  . Sexual activity: Yes    Birth control/ protection: Surgical   Other Topics Concern  . None   Social History Narrative  . None   ROS: All systems negative other than what is in HPI  Objective:    BP (!) 171/105   Pulse 94   Temp 97.8 F (36.6 C) (Oral)   Ht 5' 2"  (1.575 m)   Wt 177 lb 9.6 oz (80.6 kg)   BMI 32.48 kg/m   Wt Readings from Last 3 Encounters:  09/29/16 177 lb 9.6 oz (80.6 kg)  09/12/16 180 lb (81.6 kg)  08/08/16 190 lb (86.2 kg)    Gen: NAD, alert, cooperative with exam, NCAT EYES: EOMI, no conjunctival injection, or no icterus ENT:  TMs pearly gray b/l, OP without erythema LYMPH: no cervical LAD CV: NRRR, normal S1/S2, no murmur, distal pulses 2+ b/l Resp: CTABL, no wheezes, normal WOB Abd: +BS, soft, mildly distended, TTP RUQ, epigastric areas. no guarding or organomegaly Ext: No edema, warm Neuro: Alert and oriented  Assessment & Plan:  Deborah Russo was seen today for new patient (initial visit), multiple med problem f/u.  Diagnoses and all orders for this visit:  Chronic abdominal pain Worsening  symptoms Now with weight loss Not able to eat due to pain No appetite Normal CT scan in 02/2016 Avoid NSAIDs Denies EtOH use -     Ambulatory referral to Gastroenterology -     omeprazole (PRILOSEC) 20 MG capsule; Take 1 capsule (20 mg total) by mouth daily. -     sucralfate (CARAFATE) 1 g tablet; Take 1 tablet (1 g total) by mouth 3 (three) times daily as needed.  Aortic atherosclerosis (HCC) Seen on CT scan -     pravastatin (PRAVACHOL) 20 MG tablet; Take 1 tablet (20 mg total) by mouth daily.  Hypothyroidism, unspecified type Out of meds for 2 weeks Will recheck level next visit -     levothyroxine (SYNTHROID, LEVOTHROID) 175 MCG tablet; Take 1 tablet (175 mcg total) by mouth daily before breakfast.  Essential hypertension Elevated  today Start medication Labs next visit -     lisinopril (PRINIVIL,ZESTRIL) 10 MG tablet; Take 1 tablet (10 mg total) by mouth daily.  Chronic migraine Followed by neurology, prescribed oxycontin and diazepam for migraine  Tobacco use Cont to encourage cessation  Follow up plan: Return in about 4 weeks (around 10/27/2016) for med follow up. Assunta Found, MD [place]

## 2016-10-04 ENCOUNTER — Encounter (HOSPITAL_COMMUNITY): Payer: Self-pay | Admitting: Emergency Medicine

## 2016-10-04 ENCOUNTER — Telehealth: Payer: Self-pay | Admitting: Pediatrics

## 2016-10-04 ENCOUNTER — Emergency Department (HOSPITAL_COMMUNITY)
Admission: EM | Admit: 2016-10-04 | Discharge: 2016-10-04 | Disposition: A | Discharge status: Home / Self Care | Payer: Medicare Other | Attending: Emergency Medicine | Admitting: Emergency Medicine

## 2016-10-04 ENCOUNTER — Emergency Department (HOSPITAL_COMMUNITY): Payer: Medicare Other

## 2016-10-04 DIAGNOSIS — E039 Hypothyroidism, unspecified: Secondary | ICD-10-CM | POA: Insufficient documentation

## 2016-10-04 DIAGNOSIS — R1010 Upper abdominal pain, unspecified: Secondary | ICD-10-CM | POA: Diagnosis present

## 2016-10-04 DIAGNOSIS — K2950 Unspecified chronic gastritis without bleeding: Secondary | ICD-10-CM | POA: Diagnosis not present

## 2016-10-04 DIAGNOSIS — F17210 Nicotine dependence, cigarettes, uncomplicated: Secondary | ICD-10-CM | POA: Diagnosis not present

## 2016-10-04 DIAGNOSIS — R1111 Vomiting without nausea: Secondary | ICD-10-CM | POA: Diagnosis not present

## 2016-10-04 DIAGNOSIS — R197 Diarrhea, unspecified: Secondary | ICD-10-CM | POA: Diagnosis not present

## 2016-10-04 DIAGNOSIS — R51 Headache: Secondary | ICD-10-CM | POA: Diagnosis not present

## 2016-10-04 DIAGNOSIS — Z79899 Other long term (current) drug therapy: Secondary | ICD-10-CM | POA: Insufficient documentation

## 2016-10-04 DIAGNOSIS — G43909 Migraine, unspecified, not intractable, without status migrainosus: Secondary | ICD-10-CM | POA: Diagnosis not present

## 2016-10-04 DIAGNOSIS — K2970 Gastritis, unspecified, without bleeding: Secondary | ICD-10-CM | POA: Diagnosis not present

## 2016-10-04 DIAGNOSIS — R110 Nausea: Secondary | ICD-10-CM

## 2016-10-04 LAB — CBC WITH DIFFERENTIAL/PLATELET
BASOS PCT: 1 %
Basophils Absolute: 0.1 10*3/uL (ref 0.0–0.1)
Eosinophils Absolute: 0.1 10*3/uL (ref 0.0–0.7)
Eosinophils Relative: 1 %
HEMATOCRIT: 43.9 % (ref 36.0–46.0)
HEMOGLOBIN: 15 g/dL (ref 12.0–15.0)
LYMPHS ABS: 3.8 10*3/uL (ref 0.7–4.0)
Lymphocytes Relative: 35 %
MCH: 32.9 pg (ref 26.0–34.0)
MCHC: 34.2 g/dL (ref 30.0–36.0)
MCV: 96.3 fL (ref 78.0–100.0)
MONOS PCT: 4 %
Monocytes Absolute: 0.4 10*3/uL (ref 0.1–1.0)
NEUTROS ABS: 6.5 10*3/uL (ref 1.7–7.7)
NEUTROS PCT: 59 %
Platelets: 169 10*3/uL (ref 150–400)
RBC: 4.56 MIL/uL (ref 3.87–5.11)
RDW: 16.9 % — ABNORMAL HIGH (ref 11.5–15.5)
WBC: 10.8 10*3/uL — ABNORMAL HIGH (ref 4.0–10.5)

## 2016-10-04 LAB — COMPREHENSIVE METABOLIC PANEL
ALBUMIN: 3.9 g/dL (ref 3.5–5.0)
ALK PHOS: 97 U/L (ref 38–126)
ALT: 12 U/L — AB (ref 14–54)
ANION GAP: 10 (ref 5–15)
AST: 17 U/L (ref 15–41)
BILIRUBIN TOTAL: 0.8 mg/dL (ref 0.3–1.2)
BUN: 7 mg/dL (ref 6–20)
CALCIUM: 9.4 mg/dL (ref 8.9–10.3)
CO2: 25 mmol/L (ref 22–32)
CREATININE: 0.88 mg/dL (ref 0.44–1.00)
Chloride: 103 mmol/L (ref 101–111)
GFR calc Af Amer: >60 mL/min (ref >60)
GFR calc non Af Amer: >60 mL/min (ref >60)
GLUCOSE: 110 mg/dL — AB (ref 65–99)
Potassium: 3.7 mmol/L (ref 3.5–5.1)
Sodium: 138 mmol/L (ref 135–145)
TOTAL PROTEIN: 7.4 g/dL (ref 6.5–8.1)

## 2016-10-04 LAB — URINALYSIS, ROUTINE W REFLEX MICROSCOPIC
BILIRUBIN URINE: NEGATIVE
GLUCOSE, UA: NEGATIVE mg/dL
KETONES UR: NEGATIVE mg/dL
Leukocytes, UA: NEGATIVE
NITRITE: NEGATIVE
PH: 7 (ref 5.0–8.0)
Protein, ur: NEGATIVE mg/dL
SPECIFIC GRAVITY, URINE: 1.01 (ref 1.005–1.030)

## 2016-10-04 LAB — URINE MICROSCOPIC-ADD ON

## 2016-10-04 LAB — LIPASE, BLOOD: Lipase: 32 U/L (ref 11–51)

## 2016-10-04 MED ORDER — GI COCKTAIL ~~LOC~~
30.0000 mL | Freq: Once | ORAL | Status: AC
  Administered 2016-10-04: 30 mL via ORAL
  Filled 2016-10-04: qty 30

## 2016-10-04 MED ORDER — ONDANSETRON 4 MG PO TBDP
4.0000 mg | ORAL_TABLET | Freq: Once | ORAL | Status: AC
Start: 2016-10-04 — End: 2016-10-04
  Administered 2016-10-04: 4 mg via ORAL
  Filled 2016-10-04: qty 1

## 2016-10-04 MED ORDER — SUCRALFATE 1 GM/10ML PO SUSP
1.0000 g | Freq: Once | ORAL | Status: AC
  Administered 2016-10-04: 1 g via ORAL
  Filled 2016-10-04: qty 10

## 2016-10-04 MED ORDER — ONDANSETRON HCL 4 MG PO TABS: 4.0000 mg | ORAL_TABLET | Freq: Two times a day (BID) | ORAL | 0 refills | Status: DC | PRN

## 2016-10-04 NOTE — Telephone Encounter (Signed)
Informed pt of Dr. Autumn Patty recommendations and also stated that she could take Tylenol in between. Have asked our referral coordinator to check into her referral to GI

## 2016-10-04 NOTE — Discharge Instructions (Signed)
You may take over-the-counter Mylanta or Maalox.

## 2016-10-04 NOTE — ED Notes (Signed)
Upon entering room pt is ripping off EKG leads stating "I will never come back through these door again, no one cares I am in pain". Attempted to speak with pt about medications and testing provided, pt states she doesn't want her d/c instructions. Pt storms out of room and takes papers out of this nurse's hands. Pt refuses to go over d/c papers or sign.

## 2016-10-04 NOTE — Telephone Encounter (Signed)
She should continue the sucralfate, omeprazole for abd pain. I cannot increase her narcotic pain medication, as we discussed in clinic as she is already on very high doses. It is not the best medicine for stomach pain anyway as can cause constipation and make pain worse. I sent in zofran for nausea. We are still waiting for referral appointment.

## 2016-10-04 NOTE — ED Triage Notes (Signed)
Pt reports continuing abdominal pain. Has been seen by her PCP and placed on medications. Pt states she is supposed to be seen by gastro but has not been contacted. Pt c/o pain, nausea and emesis.

## 2016-10-04 NOTE — Telephone Encounter (Signed)
Please advise and route to pool B

## 2016-10-04 NOTE — ED Provider Notes (Signed)
[facility] DEPT Provider Note   CSN: [id number] Arrival date & time: 10/04/16  1526     History   Chief Complaint Chief Complaint  Patient presents with  . Abdominal Pain    HPI Deborah Russo is a 55 y.o. female.  HPI Patient has ongoing upper abdominal pain. This been going on for several months of worsening of the last few weeks. Saw her primary physician on 10/11. Started Prilosec and Carafate. Patient states she's been compliant with these medications. She was advised to avoid NSAIDs and says she is not been taking anything other than her OxyContin for pain. The pain is worse after eating. Describes the pain as burning. Sometimes radiates through to the back. She's had loose stools with no blood noted.  Patient has daily headaches. She is followed by neurology. Headache is similar to past headaches. No weakness or numbness. No visual changes. Past Medical History:  Diagnosis Date  . Chronic abdominal pain    since approximately 1991  . Chronic migraine   . Chronic pain   . Hypothyroidism   . Nausea, vomiting, and diarrhea    recurrent, chronic  . Pain management   . Sleep apnea    has CPAP, but doesn't wear it bc she says "i can't sleep with it on"    Patient Active Problem List   Diagnosis Date Noted  . Aortic atherosclerosis ([place]) 09/29/2016  . Chronic migraine 09/29/2016  . H/O Clostridium difficile infection 07/13/2014  . Diarrhea 07/13/2014  . FTT (failure to thrive) in adult 07/13/2014  . Abdominal pain, unspecified site 07/13/2014  . Chronic abdominal pain   . Abdominal pain 06/19/2014  . Hypothyroidism 06/19/2014  . Hypokalemia 06/19/2014  . Chronic pain 06/19/2014  . Anemia 06/19/2014  . Abscess of axilla, left 03/28/2013    Past Surgical History:  Procedure Laterality Date  . ABDOMINAL HYSTERECTOMY    . ABDOMINAL SURGERY    . BIOPSY N/A 07/17/2014   Procedure: BIOPSY;  Surgeon: Rogene Houston, MD;  Location: [facility];  Service:  Endoscopy;  Laterality: N/A;  . CESAREAN SECTION    . CHOLECYSTECTOMY    . ESOPHAGOGASTRODUODENOSCOPY N/A 07/17/2014   Procedure: ESOPHAGOGASTRODUODENOSCOPY (EGD);  Surgeon: Rogene Houston, MD;  Location: [facility];  Service: Endoscopy;  Laterality: N/A;    OB History    Gravida Para Term Preterm AB Living   2 1 1   1      SAB TAB Ectopic Multiple Live Births     1             Home Medications    Prior to Admission medications   Medication Sig Start Date End Date Taking? Authorizing Provider  diazepam (VALIUM) 5 MG tablet Take 5 mg by mouth 3 (three) times daily.   Yes Historical Provider, MD  levothyroxine (SYNTHROID, LEVOTHROID) 175 MCG tablet Take 1 tablet (175 mcg total) by mouth daily before breakfast. 09/29/16  Yes Eustaquio Maize, MD  lisinopril (PRINIVIL,ZESTRIL) 10 MG tablet Take 1 tablet (10 mg total) by mouth daily. 09/29/16  Yes Eustaquio Maize, MD  omeprazole (PRILOSEC) 20 MG capsule Take 1 capsule (20 mg total) by mouth daily. 09/29/16  Yes Eustaquio Maize, MD  OxyCODONE (OXYCONTIN) 80 mg T12A 12 hr tablet Take 1 tablet (80 mg total) by mouth every 12 (twelve) hours. 07/17/14  Yes Nita Sells, MD  pravastatin (PRAVACHOL) 20 MG tablet Take 1 tablet (20 mg total) by mouth daily. 09/29/16  Yes Eustaquio Maize,  MD  sucralfate (CARAFATE) 1 g tablet Take 1 tablet (1 g total) by mouth 3 (three) times daily as needed. 09/29/16  Yes Eustaquio Maize, MD  SUMAtriptan (IMITREX) 100 MG tablet Take 100 mg by mouth every 2 (two) hours as needed for migraine.   Yes Historical Provider, MD  ondansetron (ZOFRAN) 4 MG tablet Take 1 tablet (4 mg total) by mouth 2 (two) times daily as needed for nausea or vomiting. 10/04/16   Eustaquio Maize, MD    Family History Family History  Problem Relation Age of Onset  . Asthma Mother   . Cancer Father     Social History Social History  Substance Use Topics  . Smoking status: Current Every Day Smoker    Packs/day: 1.00    Years: 41.00     Types: Cigarettes  . Smokeless tobacco: Never Used  . Alcohol use No     Allergies   Penicillins and Sulfa antibiotics   Review of Systems Review of Systems  Constitutional: Negative for chills and fever.  Respiratory: Negative for chest tightness and shortness of breath.   Cardiovascular: Negative for chest pain, palpitations and leg swelling.  Gastrointestinal: Positive for abdominal pain, diarrhea, nausea and vomiting. Negative for blood in stool.  Genitourinary: Negative for difficulty urinating, dysuria and flank pain.  Musculoskeletal: Positive for back pain. Negative for myalgias, neck pain and neck stiffness.  Skin: Negative for rash and wound.  Neurological: Positive for headaches ( ). Negative for dizziness, syncope, weakness and numbness.  All other systems reviewed and are negative.    Physical Exam Updated Vital Signs BP 113/81   Pulse 84   Temp 97.9 F (36.6 C) (Oral)   Resp 11   Ht 5' 2"  (1.575 m)   Wt 177 lb (80.3 kg)   SpO2 97%   BMI 32.37 kg/m   Physical Exam  Constitutional: She is oriented to person, place, and time. She appears well-developed and well-nourished.  She appears comfortable and in no distress.  HENT:  Head: Normocephalic and atraumatic.  Mouth/Throat: Oropharynx is clear and moist.  Eyes: EOM are normal. Pupils are equal, round, and reactive to light.  Neck: Normal range of motion. Neck supple.  No meningismus.  Cardiovascular: Normal rate and regular rhythm.  Exam reveals no gallop and no friction rub.   No murmur heard. Pulmonary/Chest: Effort normal and breath sounds normal. No respiratory distress. She has no wheezes. She has no rales. She exhibits no tenderness.  Abdominal: Soft. Bowel sounds are normal. There is tenderness (Tenderness to palpation in the left and right upper quadrants as well as the epigastrium. No pulsatile masses. No rebound or guarding.). There is no rebound and no guarding.  Musculoskeletal: Normal range  of motion. She exhibits no edema or tenderness.  No CVA tenderness bilaterally.  Neurological: She is alert and oriented to person, place, and time.  5/5 motor in all extremities. Sensation is fully intact.  Skin: Skin is warm and dry. No rash noted. No erythema.  Psychiatric: She has a normal mood and affect. Her behavior is normal.  Nursing note and vitals reviewed.    ED Treatments / Results  Labs (all labs ordered are listed, but only abnormal results are displayed) Labs Reviewed  CBC WITH DIFFERENTIAL/PLATELET - Abnormal; Notable for the following:       Result Value   WBC 10.8 (*)    RDW 16.9 (*)    All other components within normal limits  COMPREHENSIVE METABOLIC PANEL -  Abnormal; Notable for the following:    Glucose, Bld 110 (*)    ALT 12 (*)    All other components within normal limits  URINALYSIS, ROUTINE W REFLEX MICROSCOPIC (NOT AT [facility]) - Abnormal; Notable for the following:    Hgb urine dipstick TRACE (*)    All other components within normal limits  URINE MICROSCOPIC-ADD ON - Abnormal; Notable for the following:    Squamous Epithelial / LPF 0-5 (*)    Bacteria, UA RARE (*)    All other components within normal limits  LIPASE, BLOOD    EKG  EKG Interpretation None       Radiology Dg Abd Acute W/chest  Result Date: 10/04/2016 CLINICAL DATA:  Upper abdominal pain, vomiting, diarrhea, weight loss, increasing pain for 2 months EXAM: DG ABDOMEN ACUTE W/ 1V CHEST COMPARISON:  08/08/2016 FINDINGS: Enlargement of cardiac silhouette. Mediastinal contours and pulmonary vascularity normal. Lungs clear. No pleural effusion or pneumothorax. Surgical clips RIGHT upper quadrant consistent with history of cholecystectomy. Nonobstructive bowel gas pattern. No bowel dilatation, bowel wall thickening, or free intraperitoneal air. Bones appear demineralized. No definite urinary tract calcification. IMPRESSION: Minimal enlargement of cardiac silhouette. No acute abdominal  findings. Electronically Signed   By: Lavonia Dana M.D.   On: 10/04/2016 18:49    Procedures Procedures (including critical care time)  Medications Ordered in ED Medications  gi cocktail (Maalox,Lidocaine,Donnatal) (30 mLs Oral Given 10/04/16 1742)  ondansetron (ZOFRAN-ODT) disintegrating tablet 4 mg (4 mg Oral Given 10/04/16 1742)  sucralfate (CARAFATE) 1 GM/10ML suspension 1 g (1 g Oral Given 10/04/16 1903)     Initial Impression / Assessment and Plan / ED Course  I have reviewed the triage vital signs and the nursing notes.  Pertinent labs & imaging results that were available during my care of the patient were reviewed by me and considered in my medical decision making (see chart for details).  Clinical Course   Patient with ongoing upper abdominal pain. As you see gastroenterologist. Recently established care with primary physician. Started on PPI and Carafate. States she has ongoing pain despite medications. Patient is well-appearing. Hemodynamically stable. Mild elevation in white blood cell count of uncertain significance. X-ray without evidence of perforation or obstruction. No vomiting in the emergency department. Tolerating oral intake. We'll discharge home to follow-up with gastroenterology. She is quite upset that she is not getting any narcotics. I have referred back to her primary physician for her chronic pain. Return precautions given.  Final Clinical Impressions(s) / ED Diagnoses   Final diagnoses:  Chronic gastritis without bleeding, unspecified gastritis type    New Prescriptions Discharge Medication List as of 10/04/2016  7:25 PM       Julianne Rice, MD 10/04/16 2050

## 2016-10-04 NOTE — ED Notes (Signed)
Pt reports she tries not to eat because it increases the pain, states "I try to eat at least once a week".

## 2016-10-04 NOTE — ED Triage Notes (Signed)
Per EMS upper abdominal pain and migraine since last night.

## 2016-10-04 NOTE — ED Notes (Signed)
Pt sitting up in bed covering her eyes stating she needs "something for pain and nausea, something to take off the edge". MD [facility] notified.

## 2016-10-14 ENCOUNTER — Ambulatory Visit (INDEPENDENT_AMBULATORY_CARE_PROVIDER_SITE_OTHER): Payer: Medicare Other | Admitting: Internal Medicine

## 2016-10-18 ENCOUNTER — Ambulatory Visit (INDEPENDENT_AMBULATORY_CARE_PROVIDER_SITE_OTHER): Payer: Medicare Other | Admitting: Internal Medicine

## 2016-12-09 ENCOUNTER — Other Ambulatory Visit: Payer: Self-pay | Admitting: Pediatrics

## 2016-12-09 DIAGNOSIS — R109 Unspecified abdominal pain: Principal | ICD-10-CM

## 2016-12-09 DIAGNOSIS — G8929 Other chronic pain: Secondary | ICD-10-CM

## 2017-01-08 ENCOUNTER — Other Ambulatory Visit: Payer: Self-pay | Admitting: Pediatrics

## 2017-01-08 DIAGNOSIS — R109 Unspecified abdominal pain: Principal | ICD-10-CM

## 2017-01-08 DIAGNOSIS — G8929 Other chronic pain: Secondary | ICD-10-CM

## 2017-02-07 ENCOUNTER — Other Ambulatory Visit: Payer: Self-pay | Admitting: Pediatrics

## 2017-02-07 DIAGNOSIS — E039 Hypothyroidism, unspecified: Secondary | ICD-10-CM

## 2017-02-07 DIAGNOSIS — G8929 Other chronic pain: Secondary | ICD-10-CM

## 2017-02-07 DIAGNOSIS — I10 Essential (primary) hypertension: Secondary | ICD-10-CM

## 2017-02-07 DIAGNOSIS — R109 Unspecified abdominal pain: Principal | ICD-10-CM

## 2017-02-07 DIAGNOSIS — I700 Atherosclerosis of aorta: Secondary | ICD-10-CM

## 2017-03-07 DIAGNOSIS — G9059 Complex regional pain syndrome I of other specified site: Secondary | ICD-10-CM | POA: Diagnosis not present

## 2017-03-07 DIAGNOSIS — G43719 Chronic migraine without aura, intractable, without status migrainosus: Secondary | ICD-10-CM | POA: Diagnosis not present

## 2017-03-11 ENCOUNTER — Other Ambulatory Visit: Payer: Self-pay | Admitting: Pediatrics

## 2017-03-11 DIAGNOSIS — I10 Essential (primary) hypertension: Secondary | ICD-10-CM

## 2017-03-11 DIAGNOSIS — E039 Hypothyroidism, unspecified: Secondary | ICD-10-CM

## 2017-03-11 DIAGNOSIS — I700 Atherosclerosis of aorta: Secondary | ICD-10-CM

## 2017-03-29 ENCOUNTER — Encounter (HOSPITAL_COMMUNITY): Payer: Self-pay | Admitting: *Deleted

## 2017-03-29 ENCOUNTER — Emergency Department (HOSPITAL_COMMUNITY): Payer: Medicare Other

## 2017-03-29 ENCOUNTER — Inpatient Hospital Stay (HOSPITAL_COMMUNITY)
Admission: EM | Admit: 2017-03-29 | Discharge: 2017-04-01 | DRG: 066 | Disposition: A | Discharge status: Home / Self Care | Payer: Medicare Other | Attending: Internal Medicine | Admitting: Internal Medicine

## 2017-03-29 ENCOUNTER — Observation Stay (HOSPITAL_BASED_OUTPATIENT_CLINIC_OR_DEPARTMENT_OTHER): Payer: Medicare Other

## 2017-03-29 ENCOUNTER — Observation Stay (HOSPITAL_COMMUNITY): Payer: Medicare Other

## 2017-03-29 DIAGNOSIS — I700 Atherosclerosis of aorta: Secondary | ICD-10-CM | POA: Diagnosis present

## 2017-03-29 DIAGNOSIS — R200 Anesthesia of skin: Secondary | ICD-10-CM | POA: Diagnosis not present

## 2017-03-29 DIAGNOSIS — I6523 Occlusion and stenosis of bilateral carotid arteries: Secondary | ICD-10-CM | POA: Diagnosis not present

## 2017-03-29 DIAGNOSIS — I639 Cerebral infarction, unspecified: Secondary | ICD-10-CM | POA: Diagnosis not present

## 2017-03-29 DIAGNOSIS — R110 Nausea: Secondary | ICD-10-CM | POA: Diagnosis not present

## 2017-03-29 DIAGNOSIS — Z823 Family history of stroke: Secondary | ICD-10-CM

## 2017-03-29 DIAGNOSIS — R29704 NIHSS score 4: Secondary | ICD-10-CM | POA: Diagnosis present

## 2017-03-29 DIAGNOSIS — G8929 Other chronic pain: Secondary | ICD-10-CM | POA: Diagnosis present

## 2017-03-29 DIAGNOSIS — F17210 Nicotine dependence, cigarettes, uncomplicated: Secondary | ICD-10-CM | POA: Diagnosis not present

## 2017-03-29 DIAGNOSIS — R109 Unspecified abdominal pain: Secondary | ICD-10-CM | POA: Diagnosis not present

## 2017-03-29 DIAGNOSIS — G894 Chronic pain syndrome: Secondary | ICD-10-CM | POA: Diagnosis present

## 2017-03-29 DIAGNOSIS — I63529 Cerebral infarction due to unspecified occlusion or stenosis of unspecified anterior cerebral artery: Secondary | ICD-10-CM | POA: Diagnosis not present

## 2017-03-29 DIAGNOSIS — Z825 Family history of asthma and other chronic lower respiratory diseases: Secondary | ICD-10-CM

## 2017-03-29 DIAGNOSIS — Z880 Allergy status to penicillin: Secondary | ICD-10-CM

## 2017-03-29 DIAGNOSIS — R627 Adult failure to thrive: Secondary | ICD-10-CM | POA: Diagnosis present

## 2017-03-29 DIAGNOSIS — R531 Weakness: Secondary | ICD-10-CM

## 2017-03-29 DIAGNOSIS — Z90710 Acquired absence of both cervix and uterus: Secondary | ICD-10-CM

## 2017-03-29 DIAGNOSIS — E039 Hypothyroidism, unspecified: Secondary | ICD-10-CM | POA: Diagnosis present

## 2017-03-29 DIAGNOSIS — G4730 Sleep apnea, unspecified: Secondary | ICD-10-CM | POA: Diagnosis present

## 2017-03-29 DIAGNOSIS — G43909 Migraine, unspecified, not intractable, without status migrainosus: Secondary | ICD-10-CM | POA: Diagnosis present

## 2017-03-29 DIAGNOSIS — M6281 Muscle weakness (generalized): Secondary | ICD-10-CM | POA: Diagnosis not present

## 2017-03-29 DIAGNOSIS — E876 Hypokalemia: Secondary | ICD-10-CM | POA: Diagnosis present

## 2017-03-29 DIAGNOSIS — Z881 Allergy status to other antibiotic agents status: Secondary | ICD-10-CM

## 2017-03-29 DIAGNOSIS — Z79899 Other long term (current) drug therapy: Secondary | ICD-10-CM

## 2017-03-29 DIAGNOSIS — I10 Essential (primary) hypertension: Secondary | ICD-10-CM | POA: Diagnosis not present

## 2017-03-29 DIAGNOSIS — Z9049 Acquired absence of other specified parts of digestive tract: Secondary | ICD-10-CM

## 2017-03-29 DIAGNOSIS — F17200 Nicotine dependence, unspecified, uncomplicated: Secondary | ICD-10-CM

## 2017-03-29 LAB — ECHOCARDIOGRAM COMPLETE
AVLVOTPG: 7 mmHg
CHL CUP STROKE VOLUME: 31 mL
EWDT: 380 ms
FS: 52 % — AB (ref 28–44)
Height: 62 in
IV/PV OW: 1.25
LA ID, A-P, ES: 33 mm
LA diam end sys: 33 mm
LA diam index: 1.8 cm/m2
LA vol A4C: 28.2 ml
LAVOL: 38.3 mL
LAVOLIN: 20.9 mL/m2
LDCA: 3.14 cm2
LV SIMPSON'S DISK: 66
LV dias vol index: 25 mL/m2
LV sys vol index: 9 mL/m2
LV sys vol: 16 mL
LVDIAVOL: 47 mL (ref 46–106)
LVOT VTI: 21.1 cm
LVOT diameter: 20 mm
LVOT peak vel: 131 cm/s
LVOTSV: 66 mL
Lateral S' vel: 12.6 cm/s
MV Dec: 380
MV pk A vel: 118 m/s
MVPKEVEL: 68.9 m/s
PW: 12.1 mm — AB (ref 0.6–1.1)
TAPSE: 18.7 mm
Weight: 2641.6 oz

## 2017-03-29 LAB — PROTIME-INR
INR: 0.96
Prothrombin Time: 12.8 s (ref 11.4–15.2)

## 2017-03-29 LAB — DIFFERENTIAL
BASOS ABS: 0 10*3/uL (ref 0.0–0.1)
Basophils Relative: 0 %
EOS PCT: 0 %
Eosinophils Absolute: 0 10*3/uL (ref 0.0–0.7)
Lymphocytes Relative: 22 %
Lymphs Abs: 2.2 10*3/uL (ref 0.7–4.0)
Monocytes Absolute: 0.5 10*3/uL (ref 0.1–1.0)
Monocytes Relative: 5 %
NEUTROS PCT: 73 %
Neutro Abs: 7.3 10*3/uL (ref 1.7–7.7)

## 2017-03-29 LAB — URINALYSIS, ROUTINE W REFLEX MICROSCOPIC
Bilirubin Urine: NEGATIVE
Glucose, UA: NEGATIVE mg/dL
Hgb urine dipstick: NEGATIVE
Ketones, ur: NEGATIVE mg/dL
Leukocytes, UA: NEGATIVE
Nitrite: NEGATIVE
Protein, ur: NEGATIVE mg/dL
Specific Gravity, Urine: 1.004 — ABNORMAL LOW (ref 1.005–1.030)
pH: 7 (ref 5.0–8.0)

## 2017-03-29 LAB — COMPREHENSIVE METABOLIC PANEL WITH GFR
ALT: 12 U/L — ABNORMAL LOW (ref 14–54)
AST: 13 U/L — ABNORMAL LOW (ref 15–41)
Albumin: 3.3 g/dL — ABNORMAL LOW (ref 3.5–5.0)
Alkaline Phosphatase: 146 U/L — ABNORMAL HIGH (ref 38–126)
Anion gap: 9 (ref 5–15)
BUN: 6 mg/dL (ref 6–20)
CO2: 25 mmol/L (ref 22–32)
Calcium: 9 mg/dL (ref 8.9–10.3)
Chloride: 101 mmol/L (ref 101–111)
Creatinine, Ser: 0.63 mg/dL (ref 0.44–1.00)
GFR calc Af Amer: >60 mL/min
GFR calc non Af Amer: >60 mL/min
Glucose, Bld: 142 mg/dL — ABNORMAL HIGH (ref 65–99)
Potassium: 3 mmol/L — ABNORMAL LOW (ref 3.5–5.1)
Sodium: 135 mmol/L (ref 135–145)
Total Bilirubin: 0.5 mg/dL (ref 0.3–1.2)
Total Protein: 6.9 g/dL (ref 6.5–8.1)

## 2017-03-29 LAB — CBC
HCT: 44.2 % (ref 36.0–46.0)
Hemoglobin: 15.6 g/dL — ABNORMAL HIGH (ref 12.0–15.0)
MCH: 39 pg — ABNORMAL HIGH (ref 26.0–34.0)
MCHC: 35.3 g/dL (ref 30.0–36.0)
MCV: 110.5 fL — ABNORMAL HIGH (ref 78.0–100.0)
Platelets: 177 10*3/uL (ref 150–400)
RBC: 4 MIL/uL (ref 3.87–5.11)
RDW: 14.6 % (ref 11.5–15.5)
WBC: 10 10*3/uL (ref 4.0–10.5)

## 2017-03-29 LAB — ETHANOL

## 2017-03-29 LAB — RAPID URINE DRUG SCREEN, HOSP PERFORMED
Amphetamines: NOT DETECTED
Barbiturates: NOT DETECTED
Benzodiazepines: POSITIVE — AB
Cocaine: NOT DETECTED
Opiates: POSITIVE — AB
Tetrahydrocannabinol: POSITIVE — AB

## 2017-03-29 LAB — TROPONIN I: Troponin I: <0.03 ng/mL (ref <0.03)

## 2017-03-29 LAB — APTT: APTT: 31 s (ref 24–36)

## 2017-03-29 MED ORDER — LEVOTHYROXINE SODIUM 75 MCG PO TABS
175.0000 ug | ORAL_TABLET | Freq: Every day | ORAL | Status: DC
  Administered 2017-03-30 – 2017-03-31 (×2): 175 ug via ORAL
  Filled 2017-03-29 (×2): qty 1

## 2017-03-29 MED ORDER — PRAVASTATIN SODIUM 10 MG PO TABS
20.0000 mg | ORAL_TABLET | Freq: Every day | ORAL | Status: DC
  Administered 2017-03-29: 20 mg via ORAL
  Filled 2017-03-29: qty 2

## 2017-03-29 MED ORDER — SODIUM CHLORIDE 0.9 % IV BOLUS (SEPSIS)
1000.0000 mL | Freq: Once | INTRAVENOUS | Status: AC
  Administered 2017-03-29: 1000 mL via INTRAVENOUS

## 2017-03-29 MED ORDER — ASPIRIN 300 MG RE SUPP: 300.0000 mg | Freq: Every day | RECTAL | Status: DC

## 2017-03-29 MED ORDER — ACETAMINOPHEN 325 MG PO TABS: 650.0000 mg | ORAL_TABLET | ORAL | Status: DC | PRN

## 2017-03-29 MED ORDER — ACETAMINOPHEN 650 MG RE SUPP
650.0000 mg | RECTAL | Status: DC | PRN
Start: 2017-03-29 — End: 2017-04-01

## 2017-03-29 MED ORDER — STROKE: EARLY STAGES OF RECOVERY BOOK
Freq: Once | Status: AC
  Administered 2017-03-29: 13:00:00
  Filled 2017-03-29: qty 1

## 2017-03-29 MED ORDER — OXYCODONE-ACETAMINOPHEN 5-325 MG PO TABS
2.0000 | ORAL_TABLET | Freq: Once | ORAL | Status: AC
  Administered 2017-03-29: 2 via ORAL
  Filled 2017-03-29: qty 2

## 2017-03-29 MED ORDER — NICOTINE 21 MG/24HR TD PT24
21.0000 mg | MEDICATED_PATCH | Freq: Every day | TRANSDERMAL | Status: DC
  Administered 2017-03-29 – 2017-04-01 (×4): 21 mg via TRANSDERMAL
  Filled 2017-03-29 (×4): qty 1

## 2017-03-29 MED ORDER — OXYCODONE HCL 5 MG PO TABS
5.0000 mg | ORAL_TABLET | Freq: Two times a day (BID) | ORAL | Status: DC
  Administered 2017-03-29 – 2017-04-01 (×7): 5 mg via ORAL
  Filled 2017-03-29 (×7): qty 1

## 2017-03-29 MED ORDER — OXYCODONE-ACETAMINOPHEN 5-325 MG PO TABS
1.0000 | ORAL_TABLET | Freq: Two times a day (BID) | ORAL | Status: DC
  Administered 2017-03-29 – 2017-04-01 (×7): 1 via ORAL
  Filled 2017-03-29 (×7): qty 1

## 2017-03-29 MED ORDER — PANTOPRAZOLE SODIUM 40 MG PO TBEC
40.0000 mg | DELAYED_RELEASE_TABLET | Freq: Every day | ORAL | Status: DC
  Administered 2017-03-29 – 2017-04-01 (×4): 40 mg via ORAL
  Filled 2017-03-29 (×4): qty 1

## 2017-03-29 MED ORDER — ASPIRIN 325 MG PO TABS
325.0000 mg | ORAL_TABLET | Freq: Every day | ORAL | Status: DC
  Administered 2017-03-29 – 2017-04-01 (×4): 325 mg via ORAL
  Filled 2017-03-29 (×4): qty 1

## 2017-03-29 MED ORDER — ENSURE ENLIVE PO LIQD
237.0000 mL | Freq: Two times a day (BID) | ORAL | Status: DC
  Administered 2017-03-30 – 2017-04-01 (×2): 237 mL via ORAL

## 2017-03-29 MED ORDER — DIAZEPAM 5 MG PO TABS
5.0000 mg | ORAL_TABLET | Freq: Three times a day (TID) | ORAL | Status: DC
  Administered 2017-03-29 – 2017-04-01 (×9): 5 mg via ORAL
  Filled 2017-03-29 (×9): qty 1

## 2017-03-29 MED ORDER — LORAZEPAM 1 MG PO TABS
1.0000 mg | ORAL_TABLET | Freq: Once | ORAL | Status: AC
  Administered 2017-03-29: 1 mg via ORAL
  Filled 2017-03-29: qty 1

## 2017-03-29 MED ORDER — OXYCODONE-ACETAMINOPHEN 10-325 MG PO TABS: 1.0000 | ORAL_TABLET | Freq: Two times a day (BID) | ORAL | Status: DC

## 2017-03-29 MED ORDER — LISINOPRIL 10 MG PO TABS
10.0000 mg | ORAL_TABLET | Freq: Every day | ORAL | Status: DC
  Administered 2017-03-29 – 2017-04-01 (×3): 10 mg via ORAL
  Filled 2017-03-29 (×4): qty 1

## 2017-03-29 MED ORDER — OXYCODONE HCL ER 20 MG PO T12A
80.0000 mg | EXTENDED_RELEASE_TABLET | Freq: Two times a day (BID) | ORAL | Status: DC
  Administered 2017-03-29 – 2017-04-01 (×7): 80 mg via ORAL
  Filled 2017-03-29 (×7): qty 4

## 2017-03-29 MED ORDER — ONDANSETRON HCL 4 MG/2ML IJ SOLN
4.0000 mg | Freq: Once | INTRAMUSCULAR | Status: AC
  Administered 2017-03-29: 4 mg via INTRAVENOUS
  Filled 2017-03-29: qty 2

## 2017-03-29 MED ORDER — ACETAMINOPHEN 160 MG/5ML PO SOLN: 650.0000 mg | ORAL | Status: DC | PRN

## 2017-03-29 MED ORDER — ENOXAPARIN SODIUM 40 MG/0.4ML ~~LOC~~ SOLN
40.0000 mg | SUBCUTANEOUS | Status: DC
  Administered 2017-03-29 – 2017-04-01 (×4): 40 mg via SUBCUTANEOUS
  Filled 2017-03-29 (×4): qty 0.4

## 2017-03-29 MED ORDER — SENNOSIDES-DOCUSATE SODIUM 8.6-50 MG PO TABS: 1.0000 | ORAL_TABLET | Freq: Every evening | ORAL | Status: DC | PRN

## 2017-03-29 NOTE — Consult Note (Signed)
[place] A. Merlene Laughter, MD     [email or web address]          Deborah Russo is an 56 y.o. female.   ASSESSMENT/PLAN: 1. Large vessel infarct involving the ACA distribution on the left side. Risk factors include age, nicotine use, dyslipidemia and obstructive sleep apnea syndrome. This is a cryptogenic stroke and therefore has a greater chance of being due to atrial fibrillation. A 30 day event monitor is recommended. In the meantime antiplatelet agent with aspirin 325 is recommended. Smoking cessation, use of CPAP and statin are recommended.     The patient is a 56 year old white female who presents to the hospital with a 3 day history of significant numbness involving the right hemibody particularly the right upper extremity and trunk. She reports that she woke up 3 days with these symptoms but it appears symptoms were getting progressively worse. Initially she had some improvement with a waxing and waning course. However as the days progressed she tells me that his symptoms became more intense. She reports having unsteadiness, lightheadedness and in fact fell once with these symptoms. She does not report having weakness. She denies dysarthria or dysphagia. She does have chronic headaches but reports that her headaches were not worsened during this event. She did have some chest discomfort in yesterday. No shortness of breath. The review systems otherwise negative.   GENERAL: This a very pleasant female in no acute distress at this time.  HEENT: Supple and atraumatic.  ABDOMEN: soft  EXTREMITIES: No edema   BACK: Normal  SKIN: Normal by inspection.    MENTAL STATUS: Alert and oriented - including been oriented to her age and the month. Speech, language and cognition are generally intact. Judgment and insight normal.   CRANIAL NERVES: Pupils are equal, round and reactive to light and accomodation; extra ocular movements are full, there is no significant  nystagmus; visual fields are full; upper and lower facial muscles are normal in strength and symmetric, there is no flattening of the nasolabial folds; tongue is midline; uvula is midline; shoulder elevation is normal.  MOTOR: Normal tone, bulk and strength; no pronator drift. She does have mild drift involving the left leg and right leg.  COORDINATION: Left finger to nose is normal, right finger to nose is normal, No rest tremor; no intention tremor; no postural tremor; no bradykinesia.  REFLEXES: Deep tendon reflexes are symmetrical and normal. Babinski reflexes are flexor bilaterally.   SENSATION: Markedly reduced sensation to temperature light touch involving the neck on the right side and right hemibody including the abdomen and chest.    NIH stroke scale 4.   Blood pressure (!) 167/78, pulse 86, temperature 98.4 F (36.9 C), temperature source Oral, resp. rate 18, height 5' 2"  (1.575 m), weight 165 lb 1.6 oz (74.9 kg), SpO2 95 %.  Past Medical History:  Diagnosis Date  . Chronic abdominal pain    since approximately 1991  . Chronic migraine   . Chronic pain   . Hypothyroidism   . Nausea, vomiting, and diarrhea    recurrent, chronic  . Pain management   . Sleep apnea    has CPAP, but doesn't wear it bc she says "i can't sleep with it on"    Past Surgical History:  Procedure Laterality Date  . ABDOMINAL HYSTERECTOMY    . ABDOMINAL SURGERY    . BIOPSY N/A 07/17/2014   Procedure: BIOPSY;  Surgeon: Rogene Houston, MD;  Location: [facility];  Service: Endoscopy;  Laterality:  N/A;  . BREAST SURGERY Left    removed nipple  . CESAREAN SECTION    . CHOLECYSTECTOMY    . ESOPHAGOGASTRODUODENOSCOPY N/A 07/17/2014   Procedure: ESOPHAGOGASTRODUODENOSCOPY (EGD);  Surgeon: Rogene Houston, MD;  Location: [facility];  Service: Endoscopy;  Laterality: N/A;    Family History  Problem Relation Age of Onset  . Asthma Mother   . Cancer Father     Social History:  reports that she has been  smoking Cigarettes.  She has a 41.00 pack-year smoking history. She has never used smokeless tobacco. She reports that she does not drink alcohol or use drugs.  Allergies:  Allergies  Allergen Reactions  . Penicillins Hives    Has patient had a PCN reaction causing immediate rash, facial/tongue/throat swelling, SOB or lightheadedness with hypotension: No Has patient had a PCN reaction causing severe rash involving mucus membranes or skin necrosis: No Has patient had a PCN reaction that required hospitalization No Has patient had a PCN reaction occurring within the last 10 years: No If all of the above answers are "NO", then may proceed with Cephalosporin use.   . Sulfa Antibiotics Hives    Medications: Prior to Admission medications   Medication Sig Start Date End Date Taking? Authorizing Provider  diazepam (VALIUM) 5 MG tablet Take 5 mg by mouth 3 (three) times daily.   Yes Historical Provider, MD  levothyroxine (SYNTHROID, LEVOTHROID) 175 MCG tablet TAKE (1) TABLET DAILY BE- FORE BREAKFAST. 03/14/17  Yes Eustaquio Maize, MD  lisinopril (PRINIVIL,ZESTRIL) 10 MG tablet TAKE 1 TABLET DAILY 03/14/17  Yes Eustaquio Maize, MD  omeprazole (PRILOSEC) 20 MG capsule TAKE 1 TABLET DAILY 02/08/17  Yes Eustaquio Maize, MD  OxyCODONE (OXYCONTIN) 80 mg T12A 12 hr tablet Take 1 tablet (80 mg total) by mouth every 12 (twelve) hours. 07/17/14  Yes Nita Sells, MD  oxyCODONE-acetaminophen (PERCOCET) 10-325 MG tablet Take 1 tablet by mouth 2 (two) times daily.   Yes Historical Provider, MD  pravastatin (PRAVACHOL) 20 MG tablet TAKE 1 TABLET DAILY 03/14/17  Yes Eustaquio Maize, MD  sucralfate (CARAFATE) 1 g tablet TAKE  (1)  TABLET  THREE TIMES DAILY AS NEEDED. 01/08/17  Yes Eustaquio Maize, MD  SUMAtriptan (IMITREX) 100 MG tablet Take 100 mg by mouth every 2 (two) hours as needed for migraine.   Yes Historical Provider, MD  ondansetron (ZOFRAN) 4 MG tablet Take 1 tablet (4 mg total) by mouth 2 (two) times  daily as needed for nausea or vomiting. Patient not taking: Reported on 03/29/2017 10/04/16   Eustaquio Maize, MD    Scheduled Meds: . aspirin  300 mg Rectal Daily   Or  . aspirin  325 mg Oral Daily  . diazepam  5 mg Oral TID  . enoxaparin (LOVENOX) injection  40 mg Subcutaneous Q24H  . feeding supplement (ENSURE ENLIVE)  237 mL Oral BID BM  . [START ON 03/30/2017] levothyroxine  175 mcg Oral QAC breakfast  . lisinopril  10 mg Oral Daily  . nicotine  21 mg Transdermal Daily  . oxyCODONE-acetaminophen  1 tablet Oral BID   And  . oxyCODONE  5 mg Oral BID  . oxyCODONE  80 mg Oral Q12H  . pantoprazole  40 mg Oral Daily  . pravastatin  20 mg Oral q1800   Continuous Infusions: PRN Meds:.acetaminophen **OR** acetaminophen (TYLENOL) oral liquid 160 mg/5 mL **OR** acetaminophen, senna-docusate     Results for orders placed or performed during the hospital encounter  of 03/29/17 (from the past 48 hour(s))  Urine rapid drug screen (hosp performed)not at [facility]     Status: Abnormal   Collection Time: 03/29/17  6:15 AM  Result Value Ref Range   Opiates POSITIVE (A) NONE DETECTED   Cocaine NONE DETECTED NONE DETECTED   Benzodiazepines POSITIVE (A) NONE DETECTED   Amphetamines NONE DETECTED NONE DETECTED   Tetrahydrocannabinol POSITIVE (A) NONE DETECTED   Barbiturates NONE DETECTED NONE DETECTED    Comment:        DRUG SCREEN FOR MEDICAL PURPOSES ONLY.  IF CONFIRMATION IS NEEDED FOR ANY PURPOSE, NOTIFY LAB WITHIN 5 DAYS.        LOWEST DETECTABLE LIMITS FOR URINE DRUG SCREEN Drug Class       Cutoff (ng/mL) Amphetamine      1000 Barbiturate      200 Benzodiazepine   774 Tricyclics       128 Opiates          300 Cocaine          300 THC              50   Urinalysis, Routine w reflex microscopic     Status: Abnormal   Collection Time: 03/29/17  6:15 AM  Result Value Ref Range   Color, Urine STRAW (A) YELLOW   APPearance CLEAR CLEAR   Specific Gravity, Urine 1.004 (L) 1.005 - 1.030     pH 7.0 5.0 - 8.0   Glucose, UA NEGATIVE NEGATIVE mg/dL   Hgb urine dipstick NEGATIVE NEGATIVE   Bilirubin Urine NEGATIVE NEGATIVE   Ketones, ur NEGATIVE NEGATIVE mg/dL   Protein, ur NEGATIVE NEGATIVE mg/dL   Nitrite NEGATIVE NEGATIVE   Leukocytes, UA NEGATIVE NEGATIVE  Ethanol     Status: None   Collection Time: 03/29/17  6:32 AM  Result Value Ref Range   Alcohol, Ethyl (B) <5 <5 mg/dL    Comment:        LOWEST DETECTABLE LIMIT FOR SERUM ALCOHOL IS 5 mg/dL FOR MEDICAL PURPOSES ONLY   Protime-INR     Status: None   Collection Time: 03/29/17  6:32 AM  Result Value Ref Range   Prothrombin Time 12.8 11.4 - 15.2 seconds   INR 0.96   APTT     Status: None   Collection Time: 03/29/17  6:32 AM  Result Value Ref Range   aPTT 31 24 - 36 seconds  CBC     Status: Abnormal   Collection Time: 03/29/17  6:32 AM  Result Value Ref Range   WBC 10.0 4.0 - 10.5 K/uL   RBC 4.00 3.87 - 5.11 MIL/uL   Hemoglobin 15.6 (H) 12.0 - 15.0 g/dL   HCT 44.2 36.0 - 46.0 %   MCV 110.5 (H) 78.0 - 100.0 fL   MCH 39.0 (H) 26.0 - 34.0 pg   MCHC 35.3 30.0 - 36.0 g/dL   RDW 14.6 11.5 - 15.5 %   Platelets 177 150 - 400 K/uL  Differential     Status: None   Collection Time: 03/29/17  6:32 AM  Result Value Ref Range   Neutrophils Relative % 73 %   Neutro Abs 7.3 1.7 - 7.7 K/uL   Lymphocytes Relative 22 %   Lymphs Abs 2.2 0.7 - 4.0 K/uL   Monocytes Relative 5 %   Monocytes Absolute 0.5 0.1 - 1.0 K/uL   Eosinophils Relative 0 %   Eosinophils Absolute 0.0 0.0 - 0.7 K/uL   Basophils Relative 0 %  Basophils Absolute 0.0 0.0 - 0.1 K/uL  Comprehensive metabolic panel     Status: Abnormal   Collection Time: 03/29/17  6:32 AM  Result Value Ref Range   Sodium 135 135 - 145 mmol/L   Potassium 3.0 (L) 3.5 - 5.1 mmol/L   Chloride 101 101 - 111 mmol/L   CO2 25 22 - 32 mmol/L   Glucose, Bld 142 (H) 65 - 99 mg/dL   BUN 6 6 - 20 mg/dL   Creatinine, Ser 0.63 0.44 - 1.00 mg/dL   Calcium 9.0 8.9 - 10.3 mg/dL    Total Protein 6.9 6.5 - 8.1 g/dL   Albumin 3.3 (L) 3.5 - 5.0 g/dL   AST 13 (L) 15 - 41 U/L   ALT 12 (L) 14 - 54 U/L   Alkaline Phosphatase 146 (H) 38 - 126 U/L   Total Bilirubin 0.5 0.3 - 1.2 mg/dL   GFR calc non Af Amer >60 >60 mL/min   GFR calc Af Amer >60 >60 mL/min    Comment: (NOTE) The eGFR has been calculated using the CKD EPI equation. This calculation has not been validated in all clinical situations. eGFR's persistently <60 mL/min signify possible Chronic Kidney Disease.    Anion gap 9 5 - 15  Troponin I     Status: None   Collection Time: 03/29/17  6:32 AM  Result Value Ref Range   Troponin I <0.03 <0.03 ng/mL    Studies/Results:  HEAD CT 1. No intracranial hemorrhage, mass effect or midline shift. Mild to moderate cerebral atrophy. Mild periventricular white matter decreased attenuation probable due to chronic small vessel ischemic changes. There is small lacunar infarct in left basal ganglia. Axial image 26 there is small area of decreased attenuation in left parietal lobe high convexity measures about 1.5 cm. Best seen in coronal image 51. This may represent ischemia of indeterminate age. Clinical correlation is necessary. If recent ischemia suspected further correlation with MRI with diffusion imaging is recommended.     BRAIN MRI FINDINGS: Brain: Patchy and confluent restricted diffusion extending from the left body of the corpus callosum posteriorly along the medial right frontal and parietal lobes with both cortical and white matter involvement. The left cingulate gyrus is relatively spared. There is some medial aspect motor involvement suspected (right lower extremity representation). The more confluent areas correspond to the abnormal hypodensity on the CT today. There is associated T2 and FLAIR hyperintensity in the affected parenchyma. No associated hemorrhage or mass effect.  No contralateral or posterior fossa restricted diffusion.  Chronic lacunar infarct of the right caudate. No cortical encephalomalacia. No chronic cerebral blood products. No midline shift, mass effect, evidence of mass lesion, ventriculomegaly, extra-axial collection or acute intracranial hemorrhage. Cervicomedullary junction and pituitary are within normal limits.  Vascular: Major intracranial vascular flow voids are preserved. The distal right vertebral artery is dominant.  Skull and upper cervical spine: Negative.  Sinuses/Orbits: Negative.  Other: Visible internal auditory structures appear normal. Trace inferior left mastoid fluid. Negative scalp soft tissues.  IMPRESSION: 1. Patchy acute infarcts extending from the body of the corpus callosum posteriorly along the medial left hemisphere, most resembling distal left ACA territory ischemia. No associated hemorrhage or mass effect. 2. Chronic lacunar infarct of the right caudate nucleus.      BRAIN MRA FINDINGS: Degraded by motion artifact, particularly at the skullbase, despite repeated imaging attempts.  Antegrade flow in the distal vertebral arteries, vertebrobasilar junction and basilar artery. Both PCA and SCA origins appear patent. Posterior  communicating arteries are diminutive or absent. Grossly normal bilateral PCA branch flow.  Antegrade flow in both ICA siphons. No siphon stenosis identified. Patent carotid termini. Patent MCA and ACA origins. Bilateral M1 segments, MCA bifurcations, and proximal bilateral MCA branches are patent.  The left ACA appears mildly dominant. Bilateral A1 and proximal A2 segments are patent without stenosis. Preserved more distal ACA flow bilaterally to the level of the callosomarginal artery. No definite left ACA branch occlusion is identified.  IMPRESSION: 1. Negative for emergent large vessel occlusion, and no left ACA branch occlusion identified. 2. Degraded by motion despite repeated imaging attempts, especially at the  skullbase. No first or proximal second order arterial branch stenosis identified.     The brain MRI is reviewed in person. There is increased signal seen on diffusion has been in involving the body and splenium of the corpus callosum extending into the frontal lobe. This is all on the left side. The solution is consistent with ACA distribution. There is chronic infarct involving the left basal ganglia bigger than a lacunar. There is mild white matter disease. MRA shows some mild luminal irregularities involving the MCA bilaterally. Otherwise no other significant findings.      CAROTID DOPPLERS Moderate bilateral carotid atherosclerosis and intimal thickening. No hemodynamically significant ICA stenosis. Degree of narrowing less than 50% bilaterally.  Patent antegrade vertebral flow bilaterally.       ECHO - Left ventricle: The cavity size was normal. Systolic function was   normal. The estimated ejection fraction was in the range of 60%   to 65%. Wall motion was normal; there were no regional wall   motion abnormalities. Doppler parameters are consistent with   abnormal left ventricular relaxation (grade 1 diastolic   dysfunction). Mild concentric and moderate focal basal septal   hypertrophy. - Pericardium, extracardiac: Small pericardial effusion. Features   were not consistent with tamponade physiology.         Shey [place] A. Merlene Laughter, M.D.  Diplomate, [employer] adviser of Psychiatry and Neurology ( Neurology). 03/29/2017, 5:56 PM

## 2017-03-29 NOTE — ED Triage Notes (Signed)
Pt c/o numbness to right side of body for the last 2-3 days; pt c/o abominal pain

## 2017-03-29 NOTE — H&P (Signed)
History and Physical    Deborah Russo FYB:[id number] DOB: [birth year] DOA: 03/29/2017  Referring MD/NP/PA: Rolland Porter, EDP PCP: Eustaquio Maize, MD  Patient coming from: Home  Chief Complaint: right sided weakness  HPI: Deborah Russo is a 56 y.o. female with h/o chronic pain syndrome, hypothyroidism and tobacco abuse, here today with a 3 day h/o right sided weakness from the neck down, no subjective weakness, slurred speech or vision disturbances. CT with area of decreased attenuation in left parietal lobe that may represent ischemia, K 3.0 otherwise labs are WNL. Admission is requested for CVA work up.  Past Medical/Surgical History: Past Medical History:  Diagnosis Date  . Chronic abdominal pain    since approximately 1991  . Chronic migraine   . Chronic pain   . Hypothyroidism   . Nausea, vomiting, and diarrhea    recurrent, chronic  . Pain management   . Sleep apnea    has CPAP, but doesn't wear it bc she says "i can't sleep with it on"    Past Surgical History:  Procedure Laterality Date  . ABDOMINAL HYSTERECTOMY    . ABDOMINAL SURGERY    . BIOPSY N/A 07/17/2014   Procedure: BIOPSY;  Surgeon: Rogene Houston, MD;  Location: [facility];  Service: Endoscopy;  Laterality: N/A;  . BREAST SURGERY Left    removed nipple  . CESAREAN SECTION    . CHOLECYSTECTOMY    . ESOPHAGOGASTRODUODENOSCOPY N/A 07/17/2014   Procedure: ESOPHAGOGASTRODUODENOSCOPY (EGD);  Surgeon: Rogene Houston, MD;  Location: [facility];  Service: Endoscopy;  Laterality: N/A;    Social History:  reports that she has been smoking Cigarettes.  She has a 41.00 pack-year smoking history. She has never used smokeless tobacco. She reports that she does not drink alcohol or use drugs.  Allergies: Allergies  Allergen Reactions  . Penicillins Hives    Has patient had a PCN reaction causing immediate rash, facial/tongue/throat swelling, SOB or lightheadedness with hypotension: No Has patient had a PCN reaction  causing severe rash involving mucus membranes or skin necrosis: No Has patient had a PCN reaction that required hospitalization No Has patient had a PCN reaction occurring within the last 10 years: No If all of the above answers are "NO", then may proceed with Cephalosporin use.   . Sulfa Antibiotics Hives    Family History:  Family History  Problem Relation Age of Onset  . Asthma Mother   . Cancer Father     Prior to Admission medications   Medication Sig Start Date End Date Taking? Authorizing Provider  diazepam (VALIUM) 5 MG tablet Take 5 mg by mouth 3 (three) times daily.   Yes Historical Provider, MD  levothyroxine (SYNTHROID, LEVOTHROID) 175 MCG tablet TAKE (1) TABLET DAILY BE- FORE BREAKFAST. 03/14/17  Yes Eustaquio Maize, MD  lisinopril (PRINIVIL,ZESTRIL) 10 MG tablet TAKE 1 TABLET DAILY 03/14/17  Yes Eustaquio Maize, MD  omeprazole (PRILOSEC) 20 MG capsule TAKE 1 TABLET DAILY 02/08/17  Yes Eustaquio Maize, MD  OxyCODONE (OXYCONTIN) 80 mg T12A 12 hr tablet Take 1 tablet (80 mg total) by mouth every 12 (twelve) hours. 07/17/14  Yes Nita Sells, MD  oxyCODONE-acetaminophen (PERCOCET) 10-325 MG tablet Take 1 tablet by mouth 2 (two) times daily.   Yes Historical Provider, MD  pravastatin (PRAVACHOL) 20 MG tablet TAKE 1 TABLET DAILY 03/14/17  Yes Eustaquio Maize, MD  sucralfate (CARAFATE) 1 g tablet TAKE  (1)  TABLET  THREE TIMES DAILY AS NEEDED. 01/08/17  Yes Eustaquio Maize, MD  SUMAtriptan (IMITREX) 100 MG tablet Take 100 mg by mouth every 2 (two) hours as needed for migraine.   Yes Historical Provider, MD  ondansetron (ZOFRAN) 4 MG tablet Take 1 tablet (4 mg total) by mouth 2 (two) times daily as needed for nausea or vomiting. Patient not taking: Reported on 03/29/2017 10/04/16   Eustaquio Maize, MD    Review of Systems:  Constitutional: Denies fever, chills, diaphoresis, appetite change and fatigue.  HEENT: Denies photophobia, eye pain, redness, hearing loss, ear pain,  congestion, sore throat, rhinorrhea, sneezing, mouth sores, trouble swallowing, neck pain, neck stiffness and tinnitus.   Respiratory: Denies SOB, DOE, cough, chest tightness,  and wheezing.   Cardiovascular: Denies chest pain, palpitations and leg swelling.  Gastrointestinal: Denies nausea, vomiting, abdominal pain, diarrhea, constipation, blood in stool and abdominal distention.  Genitourinary: Denies dysuria, urgency, frequency, hematuria, flank pain and difficulty urinating.  Endocrine: Denies: hot or cold intolerance, sweats, changes in hair or nails, polyuria, polydipsia. Musculoskeletal: Denies myalgias, back pain, joint swelling, arthralgias and gait problem.  Skin: Denies pallor, rash and wound.  Neurological: Denies dizziness, seizures, syncope, weakness, light-headedness,  and headaches.  Hematological: Denies adenopathy. Easy bruising, personal or family bleeding history  Psychiatric/Behavioral: Denies suicidal ideation, mood changes, confusion, nervousness, sleep disturbance and agitation    Physical Exam: Vitals:   03/29/17 1030 03/29/17 1145 03/29/17 1345 03/29/17 1545  BP: (!) 172/98 (!) 190/95 (!) 182/105 (!) 167/78  Pulse: 96 98 89 86  Resp:  16 16 18   Temp:  98.4 F (36.9 C) 98.6 F (37 C) 98.4 F (36.9 C)  TempSrc:  Oral Oral Oral  SpO2: 96% 95% 98% 95%  Weight:  74.9 kg (165 lb 1.6 oz)    Height:  5' 2"  (1.575 m)       Constitutional: NAD, calm, comfortable Eyes: PERRL, lids and conjunctivae normal ENMT: Mucous membranes are moist. Posterior pharynx clear of any exudate or lesions.Normal dentition.  Neck: normal, supple, no masses, no thyromegaly Respiratory: clear to auscultation bilaterally, no wheezing, no crackles. Normal respiratory effort. No accessory muscle use.  Cardiovascular: Regular rate and rhythm, no murmurs / rubs / gallops. No extremity edema. 2+ pedal pulses. No carotid bruits.  Abdomen: no tenderness, no masses palpated. No  hepatosplenomegaly. Bowel sounds positive.  Musculoskeletal: no clubbing / cyanosis. No joint deformity upper and lower extremities. Good ROM, no contractures. Normal muscle tone.  Skin: no rashes, lesions, ulcers. No induration Neurologic: CN 2-12 grossly intact. Right pronator drift, decreased grip strength right hand Psychiatric: Normal judgment and insight. Alert and oriented x 3. Normal mood.    Labs on Admission: I have personally reviewed the following labs and imaging studies  CBC:  Recent Labs Lab 03/29/17 0632  WBC 10.0  NEUTROABS 7.3  HGB 15.6*  HCT 44.2  MCV 110.5*  PLT 542   Basic Metabolic Panel:  Recent Labs Lab 03/29/17 0632  NA 135  K 3.0*  CL 101  CO2 25  GLUCOSE 142*  BUN 6  CREATININE 0.63  CALCIUM 9.0   GFR: Estimated Creatinine Clearance: 73.5 mL/min (by C-G formula based on SCr of 0.63 mg/dL). Liver Function Tests:  Recent Labs Lab 03/29/17 0632  AST 13*  ALT 12*  ALKPHOS 146*  BILITOT 0.5  PROT 6.9  ALBUMIN 3.3*   No results for input(s): LIPASE, AMYLASE in the last 168 hours. No results for input(s): AMMONIA in the last 168 hours. Coagulation Profile:  Recent Labs  Lab 03/29/17 0632  INR 0.96   Cardiac Enzymes:  Recent Labs Lab 03/29/17 0632  TROPONINI <0.03   BNP (last 3 results) No results for input(s): PROBNP in the last 8760 hours. HbA1C: No results for input(s): HGBA1C in the last 72 hours. CBG: No results for input(s): GLUCAP in the last 168 hours. Lipid Profile: No results for input(s): CHOL, HDL, LDLCALC, TRIG, CHOLHDL, LDLDIRECT in the last 72 hours. Thyroid Function Tests: No results for input(s): TSH, T4TOTAL, FREET4, T3FREE, THYROIDAB in the last 72 hours. Anemia Panel: No results for input(s): VITAMINB12, FOLATE, FERRITIN, TIBC, IRON, RETICCTPCT in the last 72 hours. Urine analysis:    Component Value Date/Time   COLORURINE STRAW (A) 03/29/2017 0615   APPEARANCEUR CLEAR 03/29/2017 0615   LABSPEC  1.004 (L) 03/29/2017 0615   PHURINE 7.0 03/29/2017 0615   GLUCOSEU NEGATIVE 03/29/2017 0615   HGBUR NEGATIVE 03/29/2017 0615   BILIRUBINUR NEGATIVE 03/29/2017 0615   KETONESUR NEGATIVE 03/29/2017 0615   PROTEINUR NEGATIVE 03/29/2017 0615   UROBILINOGEN 0.2 09/02/2015 1735   NITRITE NEGATIVE 03/29/2017 0615   LEUKOCYTESUR NEGATIVE 03/29/2017 0615   Sepsis Labs: @LABRCNTIP (procalcitonin:4,lacticidven:4) )No results found for this or any previous visit (from the past 240 hour(s)).   Radiological Exams on Admission: Ct Head Wo Contrast  Result Date: 03/29/2017 CLINICAL DATA:  Numbness right side of the body for the last 2 -3 days EXAM: CT HEAD WITHOUT CONTRAST TECHNIQUE: Contiguous axial images were obtained from the base of the skull through the vertex without intravenous contrast. COMPARISON:  CT report 12/02/2000 no images available. FINDINGS: Brain: No intracranial hemorrhage, mass effect or midline shift. Mild to moderate cerebral atrophy. Mild periventricular white matter decreased attenuation probable due to chronic small vessel ischemic changes. There is small lacunar infarct in left basal ganglia. Axial image 26 there is small area of decreased attenuation in left parietal lobe high convexity measures about 1.5 cm. Best seen in coronal image 51. This may represent ischemia of indeterminate age. Clinical correlation is necessary. If recent ischemia suspected further correlation with MRI with diffusion imaging is recommended. No mass lesion is noted on this unenhanced scan. Vascular:  Mild atherosclerotic calcifications of carotid siphon Skull: No skull fracture Sinuses/Orbits: No acute findings. Other: None IMPRESSION: 1. No intracranial hemorrhage, mass effect or midline shift. Mild to moderate cerebral atrophy. Mild periventricular white matter decreased attenuation probable due to chronic small vessel ischemic changes. There is small lacunar infarct in left basal ganglia. Axial image 26  there is small area of decreased attenuation in left parietal lobe high convexity measures about 1.5 cm. Best seen in coronal image 51. This may represent ischemia of indeterminate age. Clinical correlation is necessary. If recent ischemia suspected further correlation with MRI with diffusion imaging is recommended. These results were called by telephone at the time of interpretation on 03/29/2017 at 8:12 am to Dr. Rolland Porter , who verbally acknowledged these results. Electronically Signed   By: Lahoma Crocker M.D.   On: 03/29/2017 08:13   Mr Virgel Paling SK Contrast  Result Date: 03/29/2017 CLINICAL DATA:  56 year old female with 3 days of right side weakness. Acute infarcts in the posteromedial left hemisphere on MRI today, most resembling distal left ACA territory ischemia. EXAM: MRA HEAD WITHOUT CONTRAST TECHNIQUE: Angiographic images of the Circle of Willis were obtained using MRA technique without intravenous contrast. COMPARISON:  Brain MRI 0845 hours today, head CT without contrast 0745 hours today. FINDINGS: Degraded by motion artifact, particularly at the skullbase, despite repeated  imaging attempts. Antegrade flow in the distal vertebral arteries, vertebrobasilar junction and basilar artery. Both PCA and SCA origins appear patent. Posterior communicating arteries are diminutive or absent. Grossly normal bilateral PCA branch flow. Antegrade flow in both ICA siphons. No siphon stenosis identified. Patent carotid termini. Patent MCA and ACA origins. Bilateral M1 segments, MCA bifurcations, and proximal bilateral MCA branches are patent. The left ACA appears mildly dominant. Bilateral A1 and proximal A2 segments are patent without stenosis. Preserved more distal ACA flow bilaterally to the level of the callosomarginal artery. No definite left ACA branch occlusion is identified. IMPRESSION: 1. Negative for emergent large vessel occlusion, and no left ACA branch occlusion identified. 2. Degraded by motion despite  repeated imaging attempts, especially at the skullbase. No first or proximal second order arterial branch stenosis identified. Electronically Signed   By: Genevie Ann M.D.   On: 03/29/2017 09:45   Mr Brain Wo Contrast  Result Date: 03/29/2017 CLINICAL DATA:  56 year old female with 3 days of right side weakness. EXAM: MRI HEAD WITHOUT CONTRAST TECHNIQUE: Multiplanar, multiecho pulse sequences of the brain and surrounding structures were obtained without intravenous contrast. COMPARISON:  Head CT without contrast 0745 hours today. FINDINGS: Brain: Patchy and confluent restricted diffusion extending from the left body of the corpus callosum posteriorly along the medial right frontal and parietal lobes with both cortical and white matter involvement. The left cingulate gyrus is relatively spared. There is some medial aspect motor involvement suspected (right lower extremity representation). The more confluent areas correspond to the abnormal hypodensity on the CT today. There is associated T2 and FLAIR hyperintensity in the affected parenchyma. No associated hemorrhage or mass effect. No contralateral or posterior fossa restricted diffusion. Chronic lacunar infarct of the right caudate. No cortical encephalomalacia. No chronic cerebral blood products. No midline shift, mass effect, evidence of mass lesion, ventriculomegaly, extra-axial collection or acute intracranial hemorrhage. Cervicomedullary junction and pituitary are within normal limits. Vascular: Major intracranial vascular flow voids are preserved. The distal right vertebral artery is dominant. Skull and upper cervical spine: Negative. Sinuses/Orbits: Negative. Other: Visible internal auditory structures appear normal. Trace inferior left mastoid fluid. Negative scalp soft tissues. IMPRESSION: 1. Patchy acute infarcts extending from the body of the corpus callosum posteriorly along the medial left hemisphere, most resembling distal left ACA territory ischemia.  No associated hemorrhage or mass effect. 2. Chronic lacunar infarct of the right caudate nucleus. Electronically Signed   By: Genevie Ann M.D.   On: 03/29/2017 09:40   US Carotid Bilateral (at [facility] Only)  Result Date: 03/29/2017 CLINICAL DATA:  Acute infarct by MRI 03/29/2017 EXAM: BILATERAL CAROTID DUPLEX ULTRASOUND TECHNIQUE: Pearline Cables scale imaging, color Doppler and duplex ultrasound were performed of bilateral carotid and vertebral arteries in the neck. COMPARISON:  03/29/2017 FINDINGS: Criteria: Quantification of carotid stenosis is based on velocity parameters that correlate the residual internal carotid diameter with NASCET-based stenosis levels, using the diameter of the distal internal carotid lumen as the denominator for stenosis measurement. The following velocity measurements were obtained: RIGHT ICA:  107/32 cm/sec CCA:  24/82 cm/sec SYSTOLIC ICA/CCA RATIO:  1.1 DIASTOLIC ICA/CCA RATIO:  1.7 ECA:  104 cm/sec LEFT ICA:  116/40 cm/sec CCA:  50/03 cm/sec SYSTOLIC ICA/CCA RATIO:  1.6 DIASTOLIC ICA/CCA RATIO:  2.4 ECA:  230 cm/sec RIGHT CAROTID ARTERY: Diffuse heterogeneous carotid intimal thickening and scattered atherosclerosis. Despite this, there is no hemodynamically significant right ICA stenosis, velocity elevation, or turbulent flow. RIGHT VERTEBRAL ARTERY:  Antegrade LEFT CAROTID ARTERY:  Similar diffuse carotid intimal thickening and scattered atherosclerosis. No hemodynamically significant left ICA stenosis, velocity elevation, or turbulent flow. LEFT VERTEBRAL ARTERY:  Antegrade IMPRESSION: Moderate bilateral carotid atherosclerosis and intimal thickening. No hemodynamically significant ICA stenosis. Degree of narrowing less than 50% bilaterally. Patent antegrade vertebral flow bilaterally. Electronically Signed   By: Jerilynn Mages.  Shick M.D.   On: 03/29/2017 15:48    EKG: Independently reviewed. NSR, no acute ischemic changes  Assessment/Plan Principal Problem:   Acute CVA (cerebrovascular  accident) ([place]) Active Problems:   Hypothyroidism   Chronic pain   Tobacco abuse   HTN (hypertension)    Presumed Acute CVA -Check MRI, ECHO, carotid dopplers. -ASA for secondary stroke prevention (not on ASA prior to admission). -Passed swallow eval, will start [place], -PT/OT consultations. -Neurology consultation requested.  Tobacco Abuse -Counseled on cessation. -Nicotine patch provided.  Hypothyroidism -Check TSH. -Continue home dose of synthroid.  HTN -Continue home meds. -Will not be overly aggressive with treatment given possibility of acute CVA.  Chronic Pain Syndrome -Will resume home narcotics. -Patient has been advised that there will be no escalation of narcotics while in the hospital and she will not be provided with opiate prescriptions upon DC.   DVT prophylaxis: lovenox  Code Status: full code  Family Communication: patient only  Disposition Plan: hope for DC home in 24-48 hours  Consults called: neurology  Admission status: observation    Time Spent: 80 minutes  Lelon Frohlich MD [facility] Pager [phone]  If 7PM-7AM, please contact night-coverage [email or web address] Password [email or web address]  03/29/2017, 5:50 PM

## 2017-03-29 NOTE — ED Provider Notes (Signed)
[place] DEPT Provider Note   CSN: [id number] Arrival date & time: 03/29/17  0448  Time seen 06:05 AM   History   Chief Complaint Chief Complaint  Patient presents with  . Numbness    HPI CHRISTY EHRSAM is a 56 y.o. female.  HPI  patient reports she has been having right-sided numbness that waxes and wanes for the past 3 days. She states yesterday she couldn't get up to go to the bathroom. She states the numbness has been constant since about 4 PM. She states she has chronic migraine headaches that she gets daily. She states she is currently having a pain in her temples that is sharp. She normally takes Imitrex but has not taken it for a couple of days. She states her right side is been feeling weak for the past 1-2 days. She states when she walks she feels like it's not right and she feels like she is wobbly. She states her vision is blurred today. She's been having nausea and vomiting off and on for the past 3 days. She states she's unable to keep her chronic narcotic pain medication down. She states she's never had this before. She states she is right handed. Family history she thinks both her parents had strokes and they both died in their early 67s.  PCP Eustaquio Maize, MD Pain Management Dr Beatris Si in [facility]  Past Medical History:  Diagnosis Date  . Chronic abdominal pain    since approximately 1991  . Chronic migraine   . Chronic pain   . Hypothyroidism   . Nausea, vomiting, and diarrhea    recurrent, chronic  . Pain management   . Sleep apnea    has CPAP, but doesn't wear it bc she says "i can't sleep with it on"    Patient Active Problem List   Diagnosis Date Noted  . Acute CVA (cerebrovascular accident) ([place]) 03/29/2017  . Aortic atherosclerosis ([place]) 09/29/2016  . Chronic migraine 09/29/2016  . H/O Clostridium difficile infection 07/13/2014  . Diarrhea 07/13/2014  . FTT (failure to thrive) in adult 07/13/2014  . Abdominal pain, unspecified site  07/13/2014  . Chronic abdominal pain   . Abdominal pain 06/19/2014  . Hypothyroidism 06/19/2014  . Hypokalemia 06/19/2014  . Chronic pain 06/19/2014  . Anemia 06/19/2014  . Abscess of axilla, left 03/28/2013    Past Surgical History:  Procedure Laterality Date  . ABDOMINAL HYSTERECTOMY    . ABDOMINAL SURGERY    . BIOPSY N/A 07/17/2014   Procedure: BIOPSY;  Surgeon: Rogene Houston, MD;  Location: [facility];  Service: Endoscopy;  Laterality: N/A;  . BREAST SURGERY Left    removed nipple  . CESAREAN SECTION    . CHOLECYSTECTOMY    . ESOPHAGOGASTRODUODENOSCOPY N/A 07/17/2014   Procedure: ESOPHAGOGASTRODUODENOSCOPY (EGD);  Surgeon: Rogene Houston, MD;  Location: [facility];  Service: Endoscopy;  Laterality: N/A;    OB History    Gravida Para Term Preterm AB Living   2 1 1   1      SAB TAB Ectopic Multiple Live Births     1             Home Medications    Prior to Admission medications   Medication Sig Start Date End Date Taking? Authorizing Provider  diazepam (VALIUM) 5 MG tablet Take 5 mg by mouth 3 (three) times daily.    Historical Provider, MD  levothyroxine (SYNTHROID, LEVOTHROID) 175 MCG tablet TAKE (1) TABLET DAILY BE- FORE BREAKFAST. 03/14/17  Eustaquio Maize, MD  lisinopril (PRINIVIL,ZESTRIL) 10 MG tablet TAKE 1 TABLET DAILY 03/14/17   Eustaquio Maize, MD  omeprazole (PRILOSEC) 20 MG capsule TAKE 1 TABLET DAILY 02/08/17   Eustaquio Maize, MD  ondansetron (ZOFRAN) 4 MG tablet Take 1 tablet (4 mg total) by mouth 2 (two) times daily as needed for nausea or vomiting. 10/04/16   Eustaquio Maize, MD  OxyCODONE (OXYCONTIN) 80 mg T12A 12 hr tablet Take 1 tablet (80 mg total) by mouth every 12 (twelve) hours. 07/17/14   Nita Sells, MD  pravastatin (PRAVACHOL) 20 MG tablet TAKE 1 TABLET DAILY 03/14/17   Eustaquio Maize, MD  sucralfate (CARAFATE) 1 g tablet TAKE  (1)  TABLET  THREE TIMES DAILY AS NEEDED. 01/08/17   Eustaquio Maize, MD  SUMAtriptan (IMITREX) 100 MG tablet Take 100 mg  by mouth every 2 (two) hours as needed for migraine.    Historical Provider, MD    Family History Family History  Problem Relation Age of Onset  . Asthma Mother   . Cancer Father     Social History Social History  Substance Use Topics  . Smoking status: Current Every Day Smoker    Packs/day: 1.00    Years: 41.00    Types: Cigarettes  . Smokeless tobacco: Never Used  . Alcohol use No  on disability since birth of her daughter for a damage to a nerve in her abdomen   Allergies   Penicillins and Sulfa antibiotics   Review of Systems Review of Systems  All other systems reviewed and are negative.    Physical Exam Updated Vital Signs BP (!) 172/99   Pulse 100   Resp 17   Ht 5' 2"  (1.575 m)   Wt 175 lb (79.4 kg)   SpO2 96%   BMI 32.01 kg/m   Vital signs normal except for hypertension and borderline tachycardia   Physical Exam  Constitutional: She is oriented to person, place, and time. She appears well-developed and well-nourished.  Non-toxic appearance. She does not appear ill. No distress.  HENT:  Head: Normocephalic and atraumatic.  Right Ear: External ear normal.  Left Ear: External ear normal.  Nose: Nose normal. No mucosal edema or rhinorrhea.  Mouth/Throat: Oropharynx is clear and moist. Mucous membranes are dry. No dental abscesses or uvula swelling.  Eyes: Conjunctivae and EOM are normal. Pupils are equal, round, and reactive to light.  Neck: Normal range of motion and full passive range of motion without pain. Neck supple.  Cardiovascular: Normal rate, regular rhythm and normal heart sounds.  Exam reveals no gallop and no friction rub.   No murmur heard. Pulmonary/Chest: Effort normal and breath sounds normal. No respiratory distress. She has no wheezes. She has no rhonchi. She has no rales. She exhibits no tenderness and no crepitus.  Abdominal: Soft. Normal appearance and bowel sounds are normal. She exhibits no distension. There is no tenderness.  There is no rebound and no guarding.  Musculoskeletal: Normal range of motion. She exhibits no edema or tenderness.  Moves all extremities well.   Neurological: She is alert and oriented to person, place, and time. She has normal strength. No cranial nerve deficit.  No facial nerve deficit Left grip is slightly weaker ? Pronator drift on the left RLE is weak and difficult to hold up against gravity.   Skin: Skin is warm, dry and intact. No rash noted. No erythema. No pallor.  Psychiatric: She has a normal mood and affect. Her  speech is normal and behavior is normal. Her mood appears not anxious.  Nursing note and vitals reviewed.    ED Treatments / Results  Labs (all labs ordered are listed, but only abnormal results are displayed) Results for orders placed or performed during the hospital encounter of 03/29/17  Ethanol  Result Value Ref Range   Alcohol, Ethyl (B) <5 <5 mg/dL  Protime-INR  Result Value Ref Range   Prothrombin Time 12.8 11.4 - 15.2 seconds   INR 0.96   APTT  Result Value Ref Range   aPTT 31 24 - 36 seconds  CBC  Result Value Ref Range   WBC 10.0 4.0 - 10.5 K/uL   RBC 4.00 3.87 - 5.11 MIL/uL   Hemoglobin 15.6 (H) 12.0 - 15.0 g/dL   HCT 44.2 36.0 - 46.0 %   MCV 110.5 (H) 78.0 - 100.0 fL   MCH 39.0 (H) 26.0 - 34.0 pg   MCHC 35.3 30.0 - 36.0 g/dL   RDW 14.6 11.5 - 15.5 %   Platelets 177 150 - 400 K/uL  Differential  Result Value Ref Range   Neutrophils Relative % 73 %   Neutro Abs 7.3 1.7 - 7.7 K/uL   Lymphocytes Relative 22 %   Lymphs Abs 2.2 0.7 - 4.0 K/uL   Monocytes Relative 5 %   Monocytes Absolute 0.5 0.1 - 1.0 K/uL   Eosinophils Relative 0 %   Eosinophils Absolute 0.0 0.0 - 0.7 K/uL   Basophils Relative 0 %   Basophils Absolute 0.0 0.0 - 0.1 K/uL  Comprehensive metabolic panel  Result Value Ref Range   Sodium 135 135 - 145 mmol/L   Potassium 3.0 (L) 3.5 - 5.1 mmol/L   Chloride 101 101 - 111 mmol/L   CO2 25 22 - 32 mmol/L   Glucose, Bld 142  (H) 65 - 99 mg/dL   BUN 6 6 - 20 mg/dL   Creatinine, Ser 0.63 0.44 - 1.00 mg/dL   Calcium 9.0 8.9 - 10.3 mg/dL   Total Protein 6.9 6.5 - 8.1 g/dL   Albumin 3.3 (L) 3.5 - 5.0 g/dL   AST 13 (L) 15 - 41 U/L   ALT 12 (L) 14 - 54 U/L   Alkaline Phosphatase 146 (H) 38 - 126 U/L   Total Bilirubin 0.5 0.3 - 1.2 mg/dL   GFR calc non Af Amer >60 >60 mL/min   GFR calc Af Amer >60 >60 mL/min   Anion gap 9 5 - 15  Urine rapid drug screen (hosp performed)not at [facility]  Result Value Ref Range   Opiates POSITIVE (A) NONE DETECTED   Cocaine NONE DETECTED NONE DETECTED   Benzodiazepines POSITIVE (A) NONE DETECTED   Amphetamines NONE DETECTED NONE DETECTED   Tetrahydrocannabinol POSITIVE (A) NONE DETECTED   Barbiturates NONE DETECTED NONE DETECTED  Urinalysis, Routine w reflex microscopic  Result Value Ref Range   Color, Urine STRAW (A) YELLOW   APPearance CLEAR CLEAR   Specific Gravity, Urine 1.004 (L) 1.005 - 1.030   pH 7.0 5.0 - 8.0   Glucose, UA NEGATIVE NEGATIVE mg/dL   Hgb urine dipstick NEGATIVE NEGATIVE   Bilirubin Urine NEGATIVE NEGATIVE   Ketones, ur NEGATIVE NEGATIVE mg/dL   Protein, ur NEGATIVE NEGATIVE mg/dL   Nitrite NEGATIVE NEGATIVE   Leukocytes, UA NEGATIVE NEGATIVE  Troponin I  Result Value Ref Range   Troponin I <0.03 <0.03 ng/mL   Laboratory interpretation all normal except hypokalemia, + UDS    EKG  EKG Interpretation  Date/Time:  Tuesday March 29 2017 06:36:52 EDT Ventricular Rate:  101 PR Interval:    QRS Duration: 87 QT Interval:  373 QTC Calculation: 484 R Axis:   80 Text Interpretation:  Sinus tachycardia Ventricular premature complex Aberrant complex LAE, consider biatrial enlargement Nonspecific repol abnormality, diffuse leads Electrode noise No significant change since last tracing 04 Oct 2016 Confirmed by [facility]  MD-I, Savalas Monje ([id number]) on 03/29/2017 6:51:40 AM       Radiology Ct Head Wo Contrast  Result Date: 03/29/2017 CLINICAL DATA:  Numbness right  side of the body for the last 2 -3 days EXAM: CT HEAD WITHOUT CONTRAST TECHNIQUE: Contiguous axial images were obtained from the base of the skull through the vertex without intravenous contrast. COMPARISON:  CT report 12/02/2000 no images available. FINDINGS: Brain: No intracranial hemorrhage, mass effect or midline shift. Mild to moderate cerebral atrophy. Mild periventricular white matter decreased attenuation probable due to chronic small vessel ischemic changes. There is small lacunar infarct in left basal ganglia. Axial image 26 there is small area of decreased attenuation in left parietal lobe high convexity measures about 1.5 cm. Best seen in coronal image 51. This may represent ischemia of indeterminate age. Clinical correlation is necessary. If recent ischemia suspected further correlation with MRI with diffusion imaging is recommended. No mass lesion is noted on this unenhanced scan. Vascular:  Mild atherosclerotic calcifications of carotid siphon Skull: No skull fracture Sinuses/Orbits: No acute findings. Other: None IMPRESSION: 1. No intracranial hemorrhage, mass effect or midline shift. Mild to moderate cerebral atrophy. Mild periventricular white matter decreased attenuation probable due to chronic small vessel ischemic changes. There is small lacunar infarct in left basal ganglia. Axial image 26 there is small area of decreased attenuation in left parietal lobe high convexity measures about 1.5 cm. Best seen in coronal image 51. This may represent ischemia of indeterminate age. Clinical correlation is necessary. If recent ischemia suspected further correlation with MRI with diffusion imaging is recommended. These results were called by telephone at the time of interpretation on 03/29/2017 at 8:12 am to Dr. Rolland Porter , who verbally acknowledged these results. Electronically Signed   By: Lahoma Crocker M.D.   On: 03/29/2017 08:13    Procedures Procedures (including critical care time)  Medications  Ordered in ED Medications  LORazepam (ATIVAN) tablet 1 mg (not administered)  sodium chloride 0.9 % bolus 1,000 mL (0 mLs Intravenous Stopped 03/29/17 0730)  sodium chloride 0.9 % bolus 1,000 mL (0 mLs Intravenous Stopped 03/29/17 0730)  ondansetron (ZOFRAN) injection 4 mg (4 mg Intravenous Given 03/29/17 5465)     Initial Impression / Assessment and Plan / ED Course  I have reviewed the triage vital signs and the nursing notes.  Pertinent labs & imaging results that were available during my care of the patient were reviewed by me and considered in my medical decision making (see chart for details).  Stroke evaluation was started. I've are to talk to patient that because her symptoms are more 8 hours or there would be no specific acute intervention for her stroke symptoms.  08:12 AM Radiologist called her head CT, recommends MR brain for suspicious area in the left parietal region.   MR ordered. Patient states she will need sedation. Ativan ordered.   08:37 AM Dr Jerilee Hoh, will admit to stroke workup  Final Clinical Impressions(s) / ED Diagnoses   Final diagnoses:  Right sided weakness  Numbness on right side  Cerebrovascular accident (CVA), unspecified mechanism ([place])  Plan admission  Rolland Porter, MD, Barbette Or, MD 03/29/17 [id number]

## 2017-03-29 NOTE — ED Notes (Signed)
Report given to Horris Latino, RN for room 336

## 2017-03-29 NOTE — ED Notes (Signed)
Pt asking why she has not been seen by physician.  Informed pt that there is only one doctor in the emergency department at this time and she will be around shortly.  Pt seen walking from room.  When asked if she needed something pt states "I'm going home".  Pt seen walking with steady gait, alert, and oriented from ED

## 2017-03-29 NOTE — Progress Notes (Signed)
PT Cancellation Note  Patient Details Name: Deborah Russo MRN: [id number] DOB: Jul 20, 1961   Cancelled Treatment:    Reason Eval/Treat Not Completed: Patient at procedure or test/unavailable (PT orders received, pt currently having echo performed.  Will check back on pt tomorrow morning.  )   Beth Rino Hosea, PT, DPT X: [phone]

## 2017-03-29 NOTE — Progress Notes (Signed)
*  PRELIMINARY RESULTS* Echocardiogram 2D Echocardiogram has been performed.  Deborah Russo 03/29/2017, 4:03 PM

## 2017-03-29 NOTE — Progress Notes (Signed)
SLP Cancellation Note  Patient Details Name: Deborah Russo MRN: [id number] DOB: 25-Nov-1961   Cancelled treatment:       Reason Eval/Treat Not Completed: SLP screened, no needs identified, will sign off ; Pt passed RN swallow screen and is reportedly tolerating po without incident. SLP screened Pt in room. Pt denies any changes in swallowing, speech, language, or cognition. SLE will be deferred at this time. Reconsult if indicated. SLP will sign off.  Thank you,  Genene Churn, [place] 03/29/2017, 8:49 PM

## 2017-03-29 NOTE — ED Notes (Signed)
Pt returned to room and states that she still wants to be seen.  Pt asked "How much longer until I see a doctor?"  Informed pt that there is only one doctor in the emergency department and she would be with her as quickly as possible

## 2017-03-29 NOTE — Care Management Obs Status (Signed)
[place] NOTIFICATION   Patient Details  Name: Deborah Russo MRN: [id number] Date of Birth: 07/24/1961   Medicare Observation Status Notification Given:  Yes    Sherald Barge, RN 03/29/2017, 3:27 PM

## 2017-03-29 NOTE — Progress Notes (Signed)
Occupational Therapy Screen Patient Details Name: Deborah Russo MRN: [id number] DOB: June 05, 1961 Today's Date: 03/29/2017    History of Present Illness HPI  patient reports she has been having right-sided numbness that waxes and wanes for the past 3 days. She states yesterday she couldn't get up to go to the bathroom. She states the numbness has been constant since about 4 PM. She states she has chronic migraine headaches that she gets daily. She states she is currently having a pain in her temples that is sharp. She normally takes Imitrex but has not taken it for a couple of days. She states her right side is been feeling weak for the past 1-2 days. She states when she walks she feels like it's not right and she feels like she is wobbly.   Clinical Impression   Pt I bed upon therapy arrival. Short OT evaluation completed due to transporter arriving for patient to take it [place]. No charge for visit due to decreased time with patient. Based on my time, patient's main deficit with OT is her RUE numbness. Coordination and strength are functional. Patient did report that she has been up and walking although her weakness comes and goes in her legs. At this time, I don't see any additional OT needs although if patient continues to have complaints of RUE weakness a referral to Outpatient OT services would be needed.     Follow Up Recommendations  Other (comment) (Based on short OT eval I don't see any OT needs. If weakness is causing difficulty with ADL completion, I recommend some OP OT services. )    Equipment Recommendations  None recommended by OT       Precautions / Restrictions Precautions Precautions: Fall Precaution Comments: 3 falls in the past 6 months. Last fall being the day before yesterday.             ADL either performed or assessed with clinical judgement   ADL Overall ADL's : Modified independent                                       General ADL Comments:  Patient was able to transfer to wheelchair from bed at Mod I. supine to sit with HOB elevated was Mod I requiring increased time and use of bed rails.      Vision Patient Visual Report:  (Unable to assess this date) Additional Comments: Did not have time to assess vision. H&P reports that patient had complaints of blurry vision.             Pertinent Vitals/Pain Pain Assessment: No/denies pain     Hand Dominance Right   Extremity/Trunk Assessment Upper Extremity Assessment Upper Extremity Assessment: Overall WFL for tasks assessed;RUE deficits/detail RUE Deficits / Details: Patient complains of tingling/numbness in RUE.            Communication Communication Communication: No difficulties   Cognition Arousal/Alertness: Awake/alert Behavior During Therapy: WFL for tasks assessed/performed Overall Cognitive Status: Within Functional Limits for tasks assessed                                                Home Living Family/patient expects to be discharged to:: Private residence Living Arrangements: Children;Other relatives (Daughter and oldest sister.) Available Help at  Discharge: Family;Available 24 hours/day Type of Home: House Home Access: Stairs to enter [employer] of Steps: 2-3 Entrance Stairs-Rails: Right;Can reach both;Left Home Layout: One level     Bathroom Shower/Tub: Teacher, early years/pre: Standard Bathroom Accessibility: Yes   Home Equipment: None          Prior Functioning/Environment Level of Independence: Independent                    End of Session    Activity Tolerance: Patient tolerated treatment well Patient left: Other (comment) (with transporter)  OT Visit Diagnosis: Muscle weakness (generalized) (M62.81)                Time: 1424-1430 OT Time Calculation (min): 6 min Charges:    G-Codes: OT G-codes **NOT FOR INPATIENT CLASS** Functional Assessment Tool Used: AM-PAC 6 Clicks  Daily Activity Functional Limitation: Self care Self Care Current Status (F6433): 0 percent impaired, limited or restricted Self Care Goal Status (I9518): 0 percent impaired, limited or restricted Self Care Discharge Status (A4166): 0 percent impaired, limited or restricted   Ailene Ravel, OTR/L,CBIS  [phone]   Chandi Nicklin, Clarene Duke 03/29/2017, 2:42 PM

## 2017-03-30 DIAGNOSIS — I10 Essential (primary) hypertension: Secondary | ICD-10-CM

## 2017-03-30 DIAGNOSIS — G8929 Other chronic pain: Secondary | ICD-10-CM

## 2017-03-30 DIAGNOSIS — E039 Hypothyroidism, unspecified: Secondary | ICD-10-CM

## 2017-03-30 DIAGNOSIS — I639 Cerebral infarction, unspecified: Secondary | ICD-10-CM | POA: Diagnosis not present

## 2017-03-30 LAB — VITAMIN B12: VITAMIN B 12: 106 pg/mL — AB (ref 180–914)

## 2017-03-30 LAB — HIV ANTIBODY (ROUTINE TESTING W REFLEX): HIV SCREEN 4TH GENERATION: NONREACTIVE

## 2017-03-30 LAB — LIPID PANEL
CHOLESTEROL: 201 mg/dL — AB (ref 0–200)
HDL: 47 mg/dL (ref >40)
LDL Cholesterol: 115 mg/dL — ABNORMAL HIGH (ref 0–99)
TRIGLYCERIDES: 197 mg/dL — AB (ref <150)
Total CHOL/HDL Ratio: 4.3 RATIO
VLDL: 39 mg/dL (ref 0–40)

## 2017-03-30 LAB — TSH: TSH: 47.236 u[IU]/mL — ABNORMAL HIGH (ref 0.350–4.500)

## 2017-03-30 MED ORDER — ONDANSETRON HCL 4 MG/2ML IJ SOLN
4.0000 mg | Freq: Four times a day (QID) | INTRAMUSCULAR | Status: DC | PRN
  Administered 2017-03-30 – 2017-04-01 (×5): 4 mg via INTRAVENOUS
  Filled 2017-03-30 (×5): qty 2

## 2017-03-30 MED ORDER — OXYCODONE-ACETAMINOPHEN 5-325 MG PO TABS
1.0000 | ORAL_TABLET | Freq: Once | ORAL | Status: AC
  Administered 2017-03-30: 1 via ORAL
  Filled 2017-03-30: qty 1

## 2017-03-30 MED ORDER — PRAVASTATIN SODIUM 40 MG PO TABS
40.0000 mg | ORAL_TABLET | Freq: Every day | ORAL | Status: DC
  Administered 2017-03-30 – 2017-03-31 (×2): 40 mg via ORAL
  Filled 2017-03-30 (×2): qty 1

## 2017-03-30 NOTE — Care Management Note (Signed)
Case Management Note  Patient Details  Name: Deborah Russo MRN: [id number] Date of Birth: Jul 04, 1961  Subjective/Objective:                  Pt admitted with weakness. She is from home, lives with her sister. She is ind with ADL's. PT has recommended OP Pt and RW. Pt has chosen [facility] from list of DME providers.   Action/Plan: Pt discharging home today with self care. Pt is agreeable to OP PT and referral has been sent to [facility]. Romualdo Bolk, of [facility], aware of referral for RW, will obtain pt info from chart and deliver DME to pt room prior to DC.   Expected Discharge Date:      03/30/2017            Expected Discharge Plan:  Home/Self Care  In-House Referral:  NA  Discharge planning Services  CM Consult  Post Acute Care Choice:  Durable Medical Equipment Choice offered to:  Patient  DME Arranged:  Gilford Rile rolling DME Agency:  [place].  Status of Service:  Completed, signed off  Sherald Barge, RN 03/30/2017, 2:02 PM

## 2017-03-30 NOTE — Evaluation (Signed)
Physical Therapy Evaluation Patient Details Name: Deborah Russo MRN: [id number] DOB: [birth year] Today's Date: 03/30/2017   History of Present Illness  HPI  patient reports she has been having right-sided numbness that waxes and wanes for the past 3 days. She states yesterday she couldn't get up to go to the bathroom. She states the numbness has been constant since about 4 PM. She states she has chronic migraine headaches that she gets daily. She states she is currently having a pain in her temples that is sharp. She normally takes Imitrex but has not taken it for a couple of days. She states her right side is been feeling weak for the past 1-2 days. She states when she walks she feels like it's not right and she feels like she is wobbly.  MRI (+) for 1. Patchy acute infarcts extending from the body of the corpus callosum posteriorly along the medial left hemisphere, most resembling distal left ACA territory ischemia. No associated hemorrhage or mass effect.  2. Chronic lacunar infarct of the right caudate nucleus.    Clinical Impression  Pt received in bed, and was agreeable to PT evaluation.  Pt is normally independent with ambulation and ADL's.  During PT evaluation today, she demonstrates generalized LE weakness, and continued sensation of R sided heaviness.  She required Min guard for ambulation x 19f and was limited due to fatigue and feeling dizzy.  Vitals were all WNL.  Pt is recommended for OPPT and she states she can get a ride with [facility].  She is also recommended to use a RW due to multiple falls recently.      Follow Up Recommendations Outpatient PT;Other (comment) (Pt states she can get a ride with [facility])    Equipment Recommendations  Rolling walker with 5" wheels    Recommendations for Other Services       Precautions / Restrictions Precautions Precautions: Fall Precaution Comments: 3 falls in the past 6 months. Last fall being the day before yesterday. Pt states that when  she would get up her LE's would be numb.   Restrictions Weight Bearing Restrictions: No      Mobility  Bed Mobility Overal bed mobility: Modified Independent                Transfers Overall transfer level: Needs assistance Equipment used: Rolling walker (2 wheeled) Transfers: Sit to/from Stand Sit to Stand: Supervision            Ambulation/Gait Ambulation/Gait assistance: Min guard Ambulation Distance (Feet): 100 Feet Assistive device: Rolling walker (2 wheeled) Gait Pattern/deviations: Step-through pattern;Decreased stance time - right;Antalgic   Gait velocity interpretation: <1.8 ft/sec, indicative of risk for recurrent falls General Gait Details: Pt took 2 short standing rest breaks due to fatigue, and c/o feeling dizzy.    Stairs            Wheelchair Mobility    Modified Rankin (Stroke Patients Only)       Balance Overall balance assessment: History of Falls;Needs assistance Sitting-balance support: Bilateral upper extremity supported;Feet supported Sitting balance-Leahy Scale: Good     Standing balance support: Bilateral upper extremity supported Standing balance-Leahy Scale: Fair                               Pertinent Vitals/Pain Pain Assessment:  (stomach is hurting a little.)    Home Living   Living Arrangements: Children;Other relatives (Dtr and oldest sister. ) Available Help  at Discharge: Family;Available 24 hours/day Type of Home: House Home Access: Stairs to enter Entrance Stairs-Rails: Right;Can reach Software engineer of Steps: 2-3 Home Layout: One level Home Equipment: None      Prior Function     Gait / Transfers Assistance Needed: independent  ADL's / Homemaking Assistance Needed: independent  Comments: Pt does not drive and no one in the household has a car.      Hand Dominance   Dominant Hand: Right    Extremity/Trunk Assessment   Upper Extremity Assessment RUE Deficits /  Details: Patient complains of tingling/numbness in RUE.     Lower Extremity Assessment Lower Extremity Assessment: Generalized weakness (hip flexion B 3-/5, knee extension B 3-/5)       Communication   Communication: No difficulties  Cognition Arousal/Alertness: Awake/alert Behavior During Therapy: WFL for tasks assessed/performed Overall Cognitive Status: Within Functional Limits for tasks assessed                                        General Comments      Exercises     Assessment/Plan    PT Assessment Patient needs continued PT services  PT Problem List Decreased strength;Decreased activity tolerance;Decreased balance;Decreased mobility;Decreased knowledge of use of DME       PT Treatment Interventions DME instruction;Gait training;Functional mobility training;Therapeutic activities;Therapeutic exercise;Balance training;Patient/family education    PT Goals (Current goals can be found in the Care Plan section)  Acute Rehab PT Goals Patient Stated Goal: Pt wants to go home.  PT Goal Formulation: With patient Time For Goal Achievement: 04/06/17 Potential to Achieve Goals: Good    Frequency Min 5X/week   Barriers to discharge        Co-evaluation               End of Session Equipment Utilized During Treatment: Gait belt Activity Tolerance: Patient limited by fatigue Patient left: in chair;with call bell/phone within reach Nurse Communication: Mobility status (mobility sheet left hanging in the room. ) PT Visit Diagnosis: Other abnormalities of gait and mobility (R26.89);Muscle weakness (generalized) (M62.81)    Time: 7654-6503 PT Time Calculation (min) (ACUTE ONLY): 32 min   Charges:   PT Evaluation $PT Eval Low Complexity: 1 Procedure PT Treatments $Gait Training: 8-22 mins   PT G Codes:   PT G-Codes **NOT FOR INPATIENT CLASS** Functional Assessment Tool Used: AM-PAC 6 Clicks Basic Mobility;Clinical judgement Functional  Limitation: Mobility: Walking and moving around Mobility: Walking and Moving Around Current Status (T4656): At least 20 percent but less than 40 percent impaired, limited or restricted Mobility: Walking and Moving Around Goal Status [phone]): At least 1 percent but less than 20 percent impaired, limited or restricted    Beth Anye Brose, PT, DPT X: [phone]

## 2017-03-30 NOTE — Care Management (Signed)
    Durable Medical Equipment        Start     Ordered   03/30/17 1053  For home use only DME Walker rolling  Once    Question:  Patient needs a walker to treat with the following condition  Answer:  CVA (cerebral vascular accident) ([place])   03/30/17 1052

## 2017-03-30 NOTE — Progress Notes (Signed)
PROGRESS NOTE    Deborah Russo  QMG:[id number] DOB: 09-20-1961 DOA: 03/29/2017 PCP: Eustaquio Maize, MD    Brief Narrative: patient was admitted for acute CVA.  Neurology has seen her, recommended Statin, ASA, and 30 days holter monitor.  PT has seen her, and recommended walker, and perhaps outpatient PT.  Her ECHO showed no clots, and carotid showed no obstructive disease.  Her MRI confirmed an acute CVA, and MRA did not show any large vessel stenosis.  She is ready for discharge now, but didn't want to go home today, as she will be alone.  She made arrangement to go home tomorrow.    Assessment & Plan:   Principal Problem:   Acute CVA (cerebrovascular accident) [facility]) Active Problems:   Hypothyroidism   Chronic pain   Tobacco abuse   HTN (hypertension)   1. Acute CVA:  Continue with ASA, Lipitor, and plan to discharge with neurology follow up as soon as possible. 2. Hypothyrodisim:  Continue with supplement.   Her TSH is 47.  Suspect non compliance.  Currently on 175 mcg per day.   Needs follow up TSH in about 1 month.  3. Tobacco abuse:  Advised stop. 4. HTN:  Stable.   DVT prophylaxis: Lovenox.  Code Status: FULL CODE.  Family Communication: None.  Disposition Plan: Home.   Consultants:   Neruology.   Procedures:   None.   Antimicrobials: Anti-infectives    None       Subjective: I don't feel well today.     Objective: Vitals:   03/30/17 0745 03/30/17 0915 03/30/17 0936 03/30/17 1145  BP: 96/63 115/73 (!) 143/88 114/78  Pulse: 80 85 93 90  Resp: 20   20  Temp: 98.2 F (36.8 C)   98.4 F (36.9 C)  TempSrc: Oral   Oral  SpO2: 97% 99% 99% 99%  Weight:      Height:        Intake/Output Summary (Last 24 hours) at 03/30/17 1406 Last data filed at 03/30/17 0448  Gross per 24 hour  Intake              240 ml  Output              502 ml  Net             -262 ml   Filed Weights   03/29/17 0451 03/29/17 1145  Weight: 79.4 kg (175 lb) 74.9 kg (165  lb 1.6 oz)    Examination:  General exam: Appears calm and comfortable  Respiratory system: Clear to auscultation. Respiratory effort normal. Cardiovascular system: S1 & S2 heard, RRR. No JVD, murmurs, rubs, gallops or clicks. No pedal edema. Gastrointestinal system: Abdomen is nondistended, soft and nontender. No organomegaly or masses felt. Normal bowel sounds heard. Central nervous system: Alert and oriented. No focal neurological deficits. Extremities: slightly decrease right hand grasp. Skin: No rashes, lesions or ulcers Psychiatry: Judgement and insight appear normal. Mood & affect appropriate.   Data Reviewed: I have personally reviewed following labs and imaging studies  CBC:  Recent Labs Lab 03/29/17 0632  WBC 10.0  NEUTROABS 7.3  HGB 15.6*  HCT 44.2  MCV 110.5*  PLT 326   Basic Metabolic Panel:  Recent Labs Lab 03/29/17 0632  NA 135  K 3.0*  CL 101  CO2 25  GLUCOSE 142*  BUN 6  CREATININE 0.63  CALCIUM 9.0   GFR: Estimated Creatinine Clearance: 73.5 mL/min (by C-G formula based on SCr  of 0.63 mg/dL). Liver Function Tests:  Recent Labs Lab 03/29/17 0632  AST 13*  ALT 12*  ALKPHOS 146*  BILITOT 0.5  PROT 6.9  ALBUMIN 3.3*   Coagulation Profile:  Recent Labs Lab 03/29/17 0632  INR 0.96   Cardiac Enzymes:  Recent Labs Lab 03/29/17 0632  TROPONINI <0.03   Lipid Profile:  Recent Labs  03/30/17 0431  CHOL 201*  HDL 47  LDLCALC 115*  TRIG 197*  CHOLHDL 4.3   Thyroid Function Tests:  Recent Labs  03/30/17 0431  TSH 47.236*    Radiology Studies: Ct Head Wo Contrast  Result Date: 03/29/2017 CLINICAL DATA:  Numbness right side of the body for the last 2 -3 days EXAM: CT HEAD WITHOUT CONTRAST TECHNIQUE: Contiguous axial images were obtained from the base of the skull through the vertex without intravenous contrast. COMPARISON:  CT report 12/02/2000 no images available. FINDINGS: Brain: No intracranial hemorrhage, mass effect or  midline shift. Mild to moderate cerebral atrophy. Mild periventricular white matter decreased attenuation probable due to chronic small vessel ischemic changes. There is small lacunar infarct in left basal ganglia. Axial image 26 there is small area of decreased attenuation in left parietal lobe high convexity measures about 1.5 cm. Best seen in coronal image 51. This may represent ischemia of indeterminate age. Clinical correlation is necessary. If recent ischemia suspected further correlation with MRI with diffusion imaging is recommended. No mass lesion is noted on this unenhanced scan. Vascular:  Mild atherosclerotic calcifications of carotid siphon Skull: No skull fracture Sinuses/Orbits: No acute findings. Other: None IMPRESSION: 1. No intracranial hemorrhage, mass effect or midline shift. Mild to moderate cerebral atrophy. Mild periventricular white matter decreased attenuation probable due to chronic small vessel ischemic changes. There is small lacunar infarct in left basal ganglia. Axial image 26 there is small area of decreased attenuation in left parietal lobe high convexity measures about 1.5 cm. Best seen in coronal image 51. This may represent ischemia of indeterminate age. Clinical correlation is necessary. If recent ischemia suspected further correlation with MRI with diffusion imaging is recommended. These results were called by telephone at the time of interpretation on 03/29/2017 at 8:12 am to Dr. Rolland Porter , who verbally acknowledged these results. Electronically Signed   By: Lahoma Crocker M.D.   On: 03/29/2017 08:13   Mr Virgel Paling YB Contrast  Result Date: 03/29/2017 CLINICAL DATA:  57 year old female with 3 days of right side weakness. Acute infarcts in the posteromedial left hemisphere on MRI today, most resembling distal left ACA territory ischemia. EXAM: MRA HEAD WITHOUT CONTRAST TECHNIQUE: Angiographic images of the Circle of Willis were obtained using MRA technique without intravenous  contrast. COMPARISON:  Brain MRI 0845 hours today, head CT without contrast 0745 hours today. FINDINGS: Degraded by motion artifact, particularly at the skullbase, despite repeated imaging attempts. Antegrade flow in the distal vertebral arteries, vertebrobasilar junction and basilar artery. Both PCA and SCA origins appear patent. Posterior communicating arteries are diminutive or absent. Grossly normal bilateral PCA branch flow. Antegrade flow in both ICA siphons. No siphon stenosis identified. Patent carotid termini. Patent MCA and ACA origins. Bilateral M1 segments, MCA bifurcations, and proximal bilateral MCA branches are patent. The left ACA appears mildly dominant. Bilateral A1 and proximal A2 segments are patent without stenosis. Preserved more distal ACA flow bilaterally to the level of the callosomarginal artery. No definite left ACA branch occlusion is identified. IMPRESSION: 1. Negative for emergent large vessel occlusion, and no left ACA branch occlusion  identified. 2. Degraded by motion despite repeated imaging attempts, especially at the skullbase. No first or proximal second order arterial branch stenosis identified. Electronically Signed   By: Genevie Ann M.D.   On: 03/29/2017 09:45   Mr Brain Wo Contrast  Result Date: 03/29/2017 CLINICAL DATA:  56 year old female with 3 days of right side weakness. EXAM: MRI HEAD WITHOUT CONTRAST TECHNIQUE: Multiplanar, multiecho pulse sequences of the brain and surrounding structures were obtained without intravenous contrast. COMPARISON:  Head CT without contrast 0745 hours today. FINDINGS: Brain: Patchy and confluent restricted diffusion extending from the left body of the corpus callosum posteriorly along the medial right frontal and parietal lobes with both cortical and white matter involvement. The left cingulate gyrus is relatively spared. There is some medial aspect motor involvement suspected (right lower extremity representation). The more confluent areas  correspond to the abnormal hypodensity on the CT today. There is associated T2 and FLAIR hyperintensity in the affected parenchyma. No associated hemorrhage or mass effect. No contralateral or posterior fossa restricted diffusion. Chronic lacunar infarct of the right caudate. No cortical encephalomalacia. No chronic cerebral blood products. No midline shift, mass effect, evidence of mass lesion, ventriculomegaly, extra-axial collection or acute intracranial hemorrhage. Cervicomedullary junction and pituitary are within normal limits. Vascular: Major intracranial vascular flow voids are preserved. The distal right vertebral artery is dominant. Skull and upper cervical spine: Negative. Sinuses/Orbits: Negative. Other: Visible internal auditory structures appear normal. Trace inferior left mastoid fluid. Negative scalp soft tissues. IMPRESSION: 1. Patchy acute infarcts extending from the body of the corpus callosum posteriorly along the medial left hemisphere, most resembling distal left ACA territory ischemia. No associated hemorrhage or mass effect. 2. Chronic lacunar infarct of the right caudate nucleus. Electronically Signed   By: Genevie Ann M.D.   On: 03/29/2017 09:40   US Carotid Bilateral (at [facility] Only)  Result Date: 03/29/2017 CLINICAL DATA:  Acute infarct by MRI 03/29/2017 EXAM: BILATERAL CAROTID DUPLEX ULTRASOUND TECHNIQUE: Pearline Cables scale imaging, color Doppler and duplex ultrasound were performed of bilateral carotid and vertebral arteries in the neck. COMPARISON:  03/29/2017 FINDINGS: Criteria: Quantification of carotid stenosis is based on velocity parameters that correlate the residual internal carotid diameter with NASCET-based stenosis levels, using the diameter of the distal internal carotid lumen as the denominator for stenosis measurement. The following velocity measurements were obtained: RIGHT ICA:  107/32 cm/sec CCA:  63/14 cm/sec SYSTOLIC ICA/CCA RATIO:  1.1 DIASTOLIC ICA/CCA RATIO:  1.7 ECA:   104 cm/sec LEFT ICA:  116/40 cm/sec CCA:  97/02 cm/sec SYSTOLIC ICA/CCA RATIO:  1.6 DIASTOLIC ICA/CCA RATIO:  2.4 ECA:  230 cm/sec RIGHT CAROTID ARTERY: Diffuse heterogeneous carotid intimal thickening and scattered atherosclerosis. Despite this, there is no hemodynamically significant right ICA stenosis, velocity elevation, or turbulent flow. RIGHT VERTEBRAL ARTERY:  Antegrade LEFT CAROTID ARTERY: Similar diffuse carotid intimal thickening and scattered atherosclerosis. No hemodynamically significant left ICA stenosis, velocity elevation, or turbulent flow. LEFT VERTEBRAL ARTERY:  Antegrade IMPRESSION: Moderate bilateral carotid atherosclerosis and intimal thickening. No hemodynamically significant ICA stenosis. Degree of narrowing less than 50% bilaterally. Patent antegrade vertebral flow bilaterally. Electronically Signed   By: Jerilynn Mages.  Shick M.D.   On: 03/29/2017 15:48    Scheduled Meds: . aspirin  300 mg Rectal Daily   Or  . aspirin  325 mg Oral Daily  . diazepam  5 mg Oral TID  . enoxaparin (LOVENOX) injection  40 mg Subcutaneous Q24H  . feeding supplement (ENSURE ENLIVE)  237 mL Oral BID  BM  . levothyroxine  175 mcg Oral QAC breakfast  . lisinopril  10 mg Oral Daily  . nicotine  21 mg Transdermal Daily  . oxyCODONE-acetaminophen  1 tablet Oral BID   And  . oxyCODONE  5 mg Oral BID  . oxyCODONE  80 mg Oral Q12H  . pantoprazole  40 mg Oral Daily  . pravastatin  20 mg Oral q1800   Continuous Infusions:   LOS: 0 days   Fernandez Kenley, MD FACP Hospitalist.   If 7PM-7AM, please contact night-coverage [email or web address] Password [email or web address] 03/30/2017, 2:06 PM

## 2017-03-31 DIAGNOSIS — Z880 Allergy status to penicillin: Secondary | ICD-10-CM | POA: Diagnosis not present

## 2017-03-31 DIAGNOSIS — Z90710 Acquired absence of both cervix and uterus: Secondary | ICD-10-CM | POA: Diagnosis not present

## 2017-03-31 DIAGNOSIS — G894 Chronic pain syndrome: Secondary | ICD-10-CM | POA: Diagnosis present

## 2017-03-31 DIAGNOSIS — I700 Atherosclerosis of aorta: Secondary | ICD-10-CM | POA: Diagnosis present

## 2017-03-31 DIAGNOSIS — Z9049 Acquired absence of other specified parts of digestive tract: Secondary | ICD-10-CM | POA: Diagnosis not present

## 2017-03-31 DIAGNOSIS — Z823 Family history of stroke: Secondary | ICD-10-CM | POA: Diagnosis not present

## 2017-03-31 DIAGNOSIS — R109 Unspecified abdominal pain: Secondary | ICD-10-CM | POA: Diagnosis present

## 2017-03-31 DIAGNOSIS — G43909 Migraine, unspecified, not intractable, without status migrainosus: Secondary | ICD-10-CM | POA: Diagnosis present

## 2017-03-31 DIAGNOSIS — R110 Nausea: Secondary | ICD-10-CM | POA: Diagnosis not present

## 2017-03-31 DIAGNOSIS — E876 Hypokalemia: Secondary | ICD-10-CM | POA: Diagnosis present

## 2017-03-31 DIAGNOSIS — R627 Adult failure to thrive: Secondary | ICD-10-CM | POA: Diagnosis present

## 2017-03-31 DIAGNOSIS — E039 Hypothyroidism, unspecified: Secondary | ICD-10-CM | POA: Diagnosis not present

## 2017-03-31 DIAGNOSIS — R531 Weakness: Secondary | ICD-10-CM | POA: Diagnosis not present

## 2017-03-31 DIAGNOSIS — I10 Essential (primary) hypertension: Secondary | ICD-10-CM | POA: Diagnosis not present

## 2017-03-31 DIAGNOSIS — I63529 Cerebral infarction due to unspecified occlusion or stenosis of unspecified anterior cerebral artery: Secondary | ICD-10-CM | POA: Diagnosis present

## 2017-03-31 DIAGNOSIS — Z881 Allergy status to other antibiotic agents status: Secondary | ICD-10-CM | POA: Diagnosis not present

## 2017-03-31 DIAGNOSIS — G4730 Sleep apnea, unspecified: Secondary | ICD-10-CM | POA: Diagnosis present

## 2017-03-31 DIAGNOSIS — F17210 Nicotine dependence, cigarettes, uncomplicated: Secondary | ICD-10-CM | POA: Diagnosis present

## 2017-03-31 DIAGNOSIS — Z79899 Other long term (current) drug therapy: Secondary | ICD-10-CM | POA: Diagnosis not present

## 2017-03-31 DIAGNOSIS — R29704 NIHSS score 4: Secondary | ICD-10-CM | POA: Diagnosis present

## 2017-03-31 DIAGNOSIS — Z825 Family history of asthma and other chronic lower respiratory diseases: Secondary | ICD-10-CM | POA: Diagnosis not present

## 2017-03-31 DIAGNOSIS — I639 Cerebral infarction, unspecified: Secondary | ICD-10-CM | POA: Diagnosis not present

## 2017-03-31 LAB — RPR: RPR Ser Ql: NONREACTIVE

## 2017-03-31 LAB — LIPASE, BLOOD: Lipase: 22 U/L (ref 11–51)

## 2017-03-31 LAB — HEMOGLOBIN A1C
HEMOGLOBIN A1C: 5 % (ref 4.8–5.6)
MEAN PLASMA GLUCOSE: 97 mg/dL

## 2017-03-31 MED ORDER — DEXTROSE-NACL 5-0.9 % IV SOLN
INTRAVENOUS | Status: DC
  Administered 2017-03-31 – 2017-04-01 (×2): via INTRAVENOUS

## 2017-03-31 MED ORDER — MAGNESIUM CITRATE PO SOLN
0.5000 | Freq: Once | ORAL | Status: AC
  Administered 2017-03-31: 0.5 via ORAL
  Filled 2017-03-31: qty 296

## 2017-03-31 MED ORDER — LEVOTHYROXINE SODIUM 100 MCG PO TABS
200.0000 ug | ORAL_TABLET | Freq: Every day | ORAL | Status: DC
  Administered 2017-04-01: 200 ug via ORAL
  Filled 2017-03-31: qty 2

## 2017-03-31 MED ORDER — PROMETHAZINE HCL 25 MG/ML IJ SOLN
6.2500 mg | Freq: Four times a day (QID) | INTRAMUSCULAR | Status: DC | PRN
  Administered 2017-03-31 – 2017-04-01 (×4): 6.25 mg via INTRAVENOUS
  Filled 2017-03-31 (×4): qty 1

## 2017-03-31 NOTE — Care Management (Signed)
OP OT added to OP rehab referral per PT recommendation.

## 2017-03-31 NOTE — Progress Notes (Signed)
PROGRESS NOTE    Deborah Russo  VQM:[id number] DOB: [birth year] DOA: 03/29/2017 PCP: Eustaquio Maize, MD    Brief Narrative: patient was admitted for acute CVA.  Neurology has seen her, recommended Statin, ASA, and 30 days holter monitor.  PT has seen her, and recommended walker, and perhaps outpatient PT.  Her ECHO showed no clots, and carotid showed no obstructive disease.  Her MRI confirmed an acute CVA, and MRA did not show any large vessel stenosis.  She is ready for discharge now, but didn't want to go home today, as she will be alone.  She made arrangement to go home tomorrow. She still has persistent nausea, and wasn't able to eat.  But she feels it is a little better.  Noted that her TSH was very low.  She has been compliant with her 150mg of synthroid at home, she said.     Assessment & Plan:   Principal Problem:   Acute CVA (cerebrovascular accident) ([facility] Active Problems:   Hypothyroidism   Chronic pain   Tobacco abuse   HTN (hypertension)  1. Acute CVA:  Continue with ASA, Lipitor, and plan to discharge with neurology follow up as soon as possible. 2. Hypothyrodisim:  Continue with supplement.   Her TSH is 47.  Will increase to 200 mcg per day since she said she has been compliant.  Currently on 175 mcg per day.   Needs follow up TSH in about 1 month.  3. Tobacco abuse:  Advised stop. 4. HTN:  Stable.  5. Nausea:  Unclear source.  Mild abdominal pain is chronic.  WIll give IVF.  Check Lipase and cmp, CBC. Will give bowel regimen.  Hold discharge.    DVT prophylaxis: Lovenox.  Code Status: FULL CODE.  Family Communication: None.  Disposition Plan: Home.    Procedures:   None.   Consultation:  Neurology.    Antimicrobials: Anti-infectives    None       Subjective:   Having nausea.  Getting better.   Objective: Vitals:   03/31/17 1112 03/31/17 1145 03/31/17 1157 03/31/17 1503  BP: (!) 158/95 134/74 126/80   Pulse: 100 92 (!) 103   Resp:  20      Temp:  98 F (36.7 C)    TempSrc:  Oral    SpO2:  98%  (!) 89%  Weight:      Height:        Intake/Output Summary (Last 24 hours) at 03/31/17 1624 Last data filed at 03/30/17 2028  Gross per 24 hour  Intake                0 ml  Output              300 ml  Net             -300 ml   Filed Weights   03/29/17 0451 03/29/17 1145  Weight: 79.4 kg (175 lb) 74.9 kg (165 lb 1.6 oz)    Examination:  General exam: Appears calm and comfortable  Respiratory system: Clear to auscultation. Respiratory effort normal. Cardiovascular system: S1 & S2 heard, RRR. No JVD, murmurs, rubs, gallops or clicks. No pedal edema. Gastrointestinal system: Abdomen is nondistended, soft and nontender. No organomegaly or masses felt. Normal bowel sounds heard. Central nervous system: Alert and oriented. No focal neurological deficits. Extremities: Symmetric 5 x 5 power. Skin: No rashes, lesions or ulcers Psychiatry: Judgement and insight appear normal. Mood & affect appropriate.   Data  Reviewed: I have personally reviewed following labs and imaging studies  CBC:  Recent Labs Lab 03/29/17 0632  WBC 10.0  NEUTROABS 7.3  HGB 15.6*  HCT 44.2  MCV 110.5*  PLT 110   Basic Metabolic Panel:  Recent Labs Lab 03/29/17 0632  NA 135  K 3.0*  CL 101  CO2 25  GLUCOSE 142*  BUN 6  CREATININE 0.63  CALCIUM 9.0   GFR: Estimated Creatinine Clearance: 73.5 mL/min (by C-G formula based on SCr of 0.63 mg/dL). Liver Function Tests:  Recent Labs Lab 03/29/17 0632  AST 13*  ALT 12*  ALKPHOS 146*  BILITOT 0.5  PROT 6.9  ALBUMIN 3.3*   Coagulation Profile:  Recent Labs Lab 03/29/17 0632  INR 0.96   Cardiac Enzymes:  Recent Labs Lab 03/29/17 0632  TROPONINI <0.03   HbA1C:  Recent Labs  03/29/17 0631  HGBA1C 5.0   Lipid Profile:  Recent Labs  03/30/17 0431  CHOL 201*  HDL 47  LDLCALC 115*  TRIG 197*  CHOLHDL 4.3   Thyroid Function Tests:  Recent Labs  03/30/17 0431   TSH 47.236*   Anemia Panel:  Recent Labs  03/30/17 0431  VITAMINB12 106*     Radiology Studies: No results found.  Scheduled Meds: . aspirin  300 mg Rectal Daily   Or  . aspirin  325 mg Oral Daily  . diazepam  5 mg Oral TID  . enoxaparin (LOVENOX) injection  40 mg Subcutaneous Q24H  . feeding supplement (ENSURE ENLIVE)  237 mL Oral BID BM  . [START ON 04/01/2017] levothyroxine  200 mcg Oral QAC breakfast  . lisinopril  10 mg Oral Daily  . nicotine  21 mg Transdermal Daily  . oxyCODONE-acetaminophen  1 tablet Oral BID   And  . oxyCODONE  5 mg Oral BID  . oxyCODONE  80 mg Oral Q12H  . pantoprazole  40 mg Oral Daily  . pravastatin  40 mg Oral q1800   Continuous Infusions: . dextrose 5 % and 0.9% NaCl       LOS: 0 days   Keontae Levingston, MD FACP Hospitalist.   If 7PM-7AM, please contact night-coverage [email or web address] Password [email or web address] 03/31/2017, 4:24 PM

## 2017-03-31 NOTE — Progress Notes (Signed)
Physical Therapy Treatment Patient Details Name: Deborah Russo MRN: [id number] DOB: 25-Dec-1960 Today's Date: 03/31/2017    History of Present Illness HPI  patient reports she has been having right-sided numbness that waxes and wanes for the past 3 days. She states yesterday she couldn't get up to go to the bathroom. She states the numbness has been constant since about 4 PM. She states she has chronic migraine headaches that she gets daily. She states she is currently having a pain in her temples that is sharp. She normally takes Imitrex but has not taken it for a couple of days. She states her right side is been feeling weak for the past 1-2 days. She states when she walks she feels like it's not right and she feels like she is wobbly.  MRI (+) for 1. Patchy acute infarcts extending from the body of the corpus callosum posteriorly along the medial left hemisphere, most resembling distal left ACA territory ischemia. No associated hemorrhage or mass effect.  2. Chronic lacunar infarct of the right caudate nucleus.    PT Comments    Pt received in bed, lights off, and pt expressed not feeling well.  Pt stated she had vomited 2x's this AM.  Despite this, pt was able to ambulate 47f with RW and min guard, however she continues to have R knee buckling. Her gait speed is extremely slow, and she c/o feeling dizzy and nauseated during gait.  Prior to gait, her BP was 158/95 HR: 100bpm, and after it was 126/80 HR: 103bpm.  Continue to recommend f/u with OPPT, and I have added recommendation for OP OT due to pt continuing to c/o discomfort with numbness of R UE.     Follow Up Recommendations  Outpatient PT;Other (comment) (OP OT for R hand)     Equipment Recommendations  Rolling walker with 5" wheels    Recommendations for Other Services       Precautions / Restrictions Precautions Precautions: Fall Precaution Comments: 3 falls in the past 6 months. Last fall being the day before yesterday. Pt  states that when she would get up her LE's would be numb.   Restrictions Weight Bearing Restrictions: No    Mobility  Bed Mobility Overal bed mobility: Modified Independent Bed Mobility: Supine to Sit     Supine to sit: Modified independent (Device/Increase time)        Transfers Overall transfer level: Needs assistance Equipment used: Rolling walker (2 wheeled) Transfers: Sit to/from Stand Sit to Stand: Supervision         General transfer comment: vc's for hand placement  Ambulation/Gait Ambulation/Gait assistance: Min guard Ambulation Distance (Feet): 80 Feet Assistive device: Rolling walker (2 wheeled) Gait Pattern/deviations: Step-to pattern   Gait velocity interpretation: <1.8 ft/sec, indicative of risk for recurrent falls General Gait Details: R knee buckling, multiple standing rest breaks, extremely decreased gait speed with pt c/o feeling dizzy and nauseated.  Pt's BP went from 158/95 HR: 100bpm prior to ambulation down to 126/80, HR: 103bpm after ambulation.     Stairs            Wheelchair Mobility    Modified Rankin (Stroke Patients Only)       Balance Overall balance assessment: History of Falls;Needs assistance Sitting-balance support: Bilateral upper extremity supported;Feet supported Sitting balance-Leahy Scale: Good     Standing balance support: Bilateral upper extremity supported Standing balance-Leahy Scale: Fair  Cognition Arousal/Alertness: Lethargic Behavior During Therapy: Flat affect Overall Cognitive Status: Within Functional Limits for tasks assessed                                        Exercises      General Comments        Pertinent Vitals/Pain Pain Assessment: No/denies pain    Home Living                      Prior Function            PT Goals (current goals can now be found in the care plan section) Acute Rehab PT Goals Patient Stated  Goal: Pt wants to go home.  PT Goal Formulation: With patient Time For Goal Achievement: 04/06/17 Potential to Achieve Goals: Good Progress towards PT goals: Progressing toward goals    Frequency    Min 5X/week      PT Plan Current plan remains appropriate    Co-evaluation             End of Session Equipment Utilized During Treatment: Gait belt Activity Tolerance: Patient limited by fatigue Patient left: in chair;with call bell/phone within reach Nurse Communication: Mobility status PT Visit Diagnosis: Other abnormalities of gait and mobility (R26.89);Muscle weakness (generalized) (M62.81)     Time: 4461-9012 PT Time Calculation (min) (ACUTE ONLY): 23 min  Charges:  $Gait Training: 8-22 mins $Therapeutic Activity: 8-22 mins                    G Codes:       Beth Xinyi Batton, PT, DPT X: [phone]

## 2017-04-01 LAB — COMPREHENSIVE METABOLIC PANEL
ALBUMIN: 2.8 g/dL — AB (ref 3.5–5.0)
ALK PHOS: 110 U/L (ref 38–126)
ALT: 11 U/L — ABNORMAL LOW (ref 14–54)
AST: 16 U/L (ref 15–41)
Anion gap: 5 (ref 5–15)
BILIRUBIN TOTAL: 0.4 mg/dL (ref 0.3–1.2)
BUN: 14 mg/dL (ref 6–20)
CALCIUM: 8.3 mg/dL — AB (ref 8.9–10.3)
CO2: 29 mmol/L (ref 22–32)
CREATININE: 0.81 mg/dL (ref 0.44–1.00)
Chloride: 101 mmol/L (ref 101–111)
GFR calc Af Amer: >60 mL/min (ref >60)
GFR calc non Af Amer: >60 mL/min (ref >60)
GLUCOSE: 107 mg/dL — AB (ref 65–99)
Potassium: 3.1 mmol/L — ABNORMAL LOW (ref 3.5–5.1)
Sodium: 135 mmol/L (ref 135–145)
TOTAL PROTEIN: 5.6 g/dL — AB (ref 6.5–8.1)

## 2017-04-01 LAB — CBC
HCT: 38.2 % (ref 36.0–46.0)
Hemoglobin: 13 g/dL (ref 12.0–15.0)
MCH: 38.3 pg — ABNORMAL HIGH (ref 26.0–34.0)
MCHC: 34 g/dL (ref 30.0–36.0)
MCV: 112.7 fL — ABNORMAL HIGH (ref 78.0–100.0)
Platelets: 117 10*3/uL — ABNORMAL LOW (ref 150–400)
RBC: 3.39 MIL/uL — ABNORMAL LOW (ref 3.87–5.11)
RDW: 14.4 % (ref 11.5–15.5)
WBC: 7.3 10*3/uL (ref 4.0–10.5)

## 2017-04-01 LAB — HOMOCYSTEINE: Homocysteine: 30.3 umol/L — ABNORMAL HIGH (ref 0.0–15.0)

## 2017-04-01 MED ORDER — LEVOTHYROXINE SODIUM 200 MCG PO TABS: 200.0000 ug | ORAL_TABLET | Freq: Every day | ORAL | 3 refills | Status: DC

## 2017-04-01 MED ORDER — SENNOSIDES-DOCUSATE SODIUM 8.6-50 MG PO TABS: 1.0000 | ORAL_TABLET | Freq: Every evening | ORAL | 0 refills | Status: DC | PRN

## 2017-04-01 MED ORDER — OXYCODONE HCL ER 80 MG PO T12A: 80.0000 mg | EXTENDED_RELEASE_TABLET | Freq: Two times a day (BID) | ORAL | 0 refills | Status: DC

## 2017-04-01 MED ORDER — ASPIRIN 325 MG PO TABS
325.0000 mg | ORAL_TABLET | Freq: Every day | ORAL | 0 refills | Status: DC
Start: 2017-04-02 — End: 2019-02-06

## 2017-04-01 MED ORDER — NICOTINE 21 MG/24HR TD PT24: 21.0000 mg | MEDICATED_PATCH | Freq: Every day | TRANSDERMAL | 0 refills | Status: DC

## 2017-04-01 NOTE — Care Management Important Message (Signed)
Important Message  Patient Details  Name: Deborah Russo MRN: [id number] Date of Birth: 1961/05/14   Medicare Important Message Given:  Yes    Sherald Barge, RN 04/01/2017, 10:08 AM

## 2017-04-01 NOTE — Care Management Important Message (Signed)
Important Message  Patient Details  Name: Deborah Russo MRN: [id number] Date of Birth: 1961-09-11   Medicare Important Message Given:  Yes    Sherald Barge, RN 04/01/2017, 2:11 PM

## 2017-04-01 NOTE — Discharge Summary (Addendum)
Physician Discharge Summary  Deborah Russo ZGY:[id number] DOB: 05/05/1961 DOA: 03/29/2017  PCP: Eustaquio Maize, MD  Admit date: 03/29/2017 Discharge date: 04/01/2017  Admitted From: Home.  Disposition:  To home.   Recommendations for Outpatient Follow-up:  1. Follow up with PCP in 1-2 weeks. 2. Follow up with neurology as recommended.   Home Health: None.  Equipment/Devices: rolling walker.  Discharge Condition: weak, but able to eat.  Nausea, but no vomiting.  CODE STATUS: FULL CODE.  Diet recommendation: as tolerated.   Brief/Interim Summary:  Patient was admitted for right sided weakness by Dr Jerilee Hoh on March 23, 2017.  As per her H and P:  " is a 56 y.o. female with h/o chronic pain syndrome, hypothyroidism and tobacco abuse, here today with a 3 day h/o right sided weakness from the neck down, no subjective weakness, slurred speech or vision disturbances. CT with area of decreased attenuation in left parietal lobe that may represent ischemia, K 3.0 otherwise labs are WNL. Admission is requested for CVA work up.  HOSPITAL COURSE:  Patient indeed had an acute CVA, as her MRI did show patchy acute infarct extending from the body of the corpus callosum posteriorly, along the medial left hemisphere, suggestive of distal left ACA territory ischemia.  Neurology had seen her in consultation, and recommended statin and ASA, along with ECHO and carotid doppler.  ECHO showed normal EF, with no definite thrombus, no PFO.  Carotid doppler showed no critical stenosis, and MRA showed no large vessel obstruction.  She was seen by PT, and PT recommended a rolling walker, and outpatient PT.  These was arranged for her.  She was going be discharged, as SNF or rehab was recommended by me, due to her fall risk and weakness, but she declined.  During her work up, TSH was found to be very high, up to 47.  A year ago, it was closed to 100.  Before increasing her supplement, I specifically asked her if she has  been compliant with her meds, and she affirmed she has been.  She is also not taking it with MVI.  Given this information, her synthroid was increased to 200 mcg per day.  I asked that she take it by itself, due to changes in absorption with other meds.  She has been feeling weak and I suspect it will take another 3 weeks for the thyroid level to be clinically effective.  She was about to be discharged, but she started to have nausea, and vomiting.  She was kept in the hospital to determine that she will improve, while giving her IVF.  Her labs were repeated, and they were normal.  Her lipase was normal as well. She did have some improvement.  After discussion, it was felt that she is stable for discharge.  She will follow up with her PCP next week.  If she can't tolerate food, for if she feels worse, she should return to the ED over the weekend, or see her PCP next week.  She was also given Nicotine patch for tobacco cessation.  I asked that she decrease her Valium to 2.22m TID, though she told me she has been on 551mTID for 20 years.  Lastly, Dr DoMerlene Laughterill see her in follow up, and he recommended a 30 days event monitor.  We will try to make arrangement for it.  Thank you for allowing me to participate in her care.  Good Day.   Discharge Diagnoses:  Principal Problem:  Acute CVA (cerebrovascular accident) [facility]) Active Problems:   Hypothyroidism   Chronic pain   Tobacco abuse   HTN (hypertension)  Discharge Instructions  Discharge Instructions    Ambulatory referral to Occupational Therapy    Complete by:  As directed    Ambulatory referral to Physical Therapy    Complete by:  As directed    Diet - low sodium heart healthy    Complete by:  As directed    Discharge instructions    Complete by:  As directed    Rest, be careful not to fall.  See your doctor next week.  If you have persistent nausea and vomiting, return to the Emergency Room.  Take your medications as directed.   Increase  activity slowly    Complete by:  As directed      Allergies as of 04/01/2017      Reactions   Penicillins Hives   Has patient had a PCN reaction causing immediate rash, facial/tongue/throat swelling, SOB or lightheadedness with hypotension: No Has patient had a PCN reaction causing severe rash involving mucus membranes or skin necrosis: No Has patient had a PCN reaction that required hospitalization No Has patient had a PCN reaction occurring within the last 10 years: No If all of the above answers are "NO", then may proceed with Cephalosporin use.   Sulfa Antibiotics Hives      Medication List    STOP taking these medications   SUMAtriptan 100 MG tablet Commonly known as:  IMITREX     TAKE these medications   aspirin 325 MG tablet Take 1 tablet (325 mg total) by mouth daily. Start taking on:  04/02/2017   diazepam 5 MG tablet Commonly known as:  VALIUM Take 5 mg by mouth 3 (three) times daily.   levothyroxine 200 MCG tablet Commonly known as:  SYNTHROID, LEVOTHROID Take 1 tablet (200 mcg total) by mouth daily before breakfast. Start taking on:  04/02/2017 What changed:  See the new instructions.   lisinopril 10 MG tablet Commonly known as:  PRINIVIL,ZESTRIL TAKE 1 TABLET DAILY   nicotine 21 mg/24hr patch Commonly known as:  NICODERM CQ - dosed in mg/24 hours Place 1 patch (21 mg total) onto the skin daily. Start taking on:  04/02/2017   omeprazole 20 MG capsule Commonly known as:  PRILOSEC TAKE 1 TABLET DAILY   ondansetron 4 MG tablet Commonly known as:  ZOFRAN Take 1 tablet (4 mg total) by mouth 2 (two) times daily as needed for nausea or vomiting.   oxyCODONE 80 mg 12 hr tablet Commonly known as:  OXYCONTIN Take 1 tablet (80 mg total) by mouth every 12 (twelve) hours. What changed:  Another medication with the same name was added. Make sure you understand how and when to take each.   oxyCODONE 80 mg 12 hr tablet Commonly known as:  OXYCONTIN Take 1 tablet  (80 mg total) by mouth every 12 (twelve) hours. What changed:  You were already taking a medication with the same name, and this prescription was added. Make sure you understand how and when to take each.   oxyCODONE-acetaminophen 10-325 MG tablet Commonly known as:  PERCOCET Take 1 tablet by mouth 2 (two) times daily.   pravastatin 20 MG tablet Commonly known as:  PRAVACHOL TAKE 1 TABLET DAILY   senna-docusate 8.6-50 MG tablet Commonly known as:  Senokot-S Take 1 tablet by mouth at bedtime as needed for mild constipation.   sucralfate 1 g tablet Commonly known as:  CARAFATE TAKE  (1)  TABLET  THREE TIMES DAILY AS NEEDED.            Durable Medical Equipment        Start     Ordered   03/30/17 1053  For home use only DME Walker rolling  Once    Question:  Patient needs a walker to treat with the following condition  Answer:  CVA (cerebral vascular accident) ([place])   03/30/17 1052      Allergies  Allergen Reactions  . Penicillins Hives    Has patient had a PCN reaction causing immediate rash, facial/tongue/throat swelling, SOB or lightheadedness with hypotension: No Has patient had a PCN reaction causing severe rash involving mucus membranes or skin necrosis: No Has patient had a PCN reaction that required hospitalization No Has patient had a PCN reaction occurring within the last 10 years: No If all of the above answers are "NO", then may proceed with Cephalosporin use.   . Sulfa Antibiotics Hives    Consultations:  None.    Procedures/Studies: Ct Head Wo Contrast  Result Date: 03/29/2017 CLINICAL DATA:  Numbness right side of the body for the last 2 -3 days EXAM: CT HEAD WITHOUT CONTRAST TECHNIQUE: Contiguous axial images were obtained from the base of the skull through the vertex without intravenous contrast. COMPARISON:  CT report 12/02/2000 no images available. FINDINGS: Brain: No intracranial hemorrhage, mass effect or midline shift. Mild to moderate  cerebral atrophy. Mild periventricular white matter decreased attenuation probable due to chronic small vessel ischemic changes. There is small lacunar infarct in left basal ganglia. Axial image 26 there is small area of decreased attenuation in left parietal lobe high convexity measures about 1.5 cm. Best seen in coronal image 51. This may represent ischemia of indeterminate age. Clinical correlation is necessary. If recent ischemia suspected further correlation with MRI with diffusion imaging is recommended. No mass lesion is noted on this unenhanced scan. Vascular:  Mild atherosclerotic calcifications of carotid siphon Skull: No skull fracture Sinuses/Orbits: No acute findings. Other: None IMPRESSION: 1. No intracranial hemorrhage, mass effect or midline shift. Mild to moderate cerebral atrophy. Mild periventricular white matter decreased attenuation probable due to chronic small vessel ischemic changes. There is small lacunar infarct in left basal ganglia. Axial image 26 there is small area of decreased attenuation in left parietal lobe high convexity measures about 1.5 cm. Best seen in coronal image 51. This may represent ischemia of indeterminate age. Clinical correlation is necessary. If recent ischemia suspected further correlation with MRI with diffusion imaging is recommended. These results were called by telephone at the time of interpretation on 03/29/2017 at 8:12 am to Dr. Rolland Porter , who verbally acknowledged these results. Electronically Signed   By: Lahoma Crocker M.D.   On: 03/29/2017 08:13   Mr Virgel Paling OV Contrast  Result Date: 03/29/2017 CLINICAL DATA:  56 year old female with 3 days of right side weakness. Acute infarcts in the posteromedial left hemisphere on MRI today, most resembling distal left ACA territory ischemia. EXAM: MRA HEAD WITHOUT CONTRAST TECHNIQUE: Angiographic images of the Circle of Willis were obtained using MRA technique without intravenous contrast. COMPARISON:  Brain MRI  0845 hours today, head CT without contrast 0745 hours today. FINDINGS: Degraded by motion artifact, particularly at the skullbase, despite repeated imaging attempts. Antegrade flow in the distal vertebral arteries, vertebrobasilar junction and basilar artery. Both PCA and SCA origins appear patent. Posterior communicating arteries are diminutive or absent. Grossly normal bilateral PCA branch  flow. Antegrade flow in both ICA siphons. No siphon stenosis identified. Patent carotid termini. Patent MCA and ACA origins. Bilateral M1 segments, MCA bifurcations, and proximal bilateral MCA branches are patent. The left ACA appears mildly dominant. Bilateral A1 and proximal A2 segments are patent without stenosis. Preserved more distal ACA flow bilaterally to the level of the callosomarginal artery. No definite left ACA branch occlusion is identified. IMPRESSION: 1. Negative for emergent large vessel occlusion, and no left ACA branch occlusion identified. 2. Degraded by motion despite repeated imaging attempts, especially at the skullbase. No first or proximal second order arterial branch stenosis identified. Electronically Signed   By: Genevie Ann M.D.   On: 03/29/2017 09:45   Mr Brain Wo Contrast  Result Date: 03/29/2017 CLINICAL DATA:  56 year old female with 3 days of right side weakness. EXAM: MRI HEAD WITHOUT CONTRAST TECHNIQUE: Multiplanar, multiecho pulse sequences of the brain and surrounding structures were obtained without intravenous contrast. COMPARISON:  Head CT without contrast 0745 hours today. FINDINGS: Brain: Patchy and confluent restricted diffusion extending from the left body of the corpus callosum posteriorly along the medial right frontal and parietal lobes with both cortical and white matter involvement. The left cingulate gyrus is relatively spared. There is some medial aspect motor involvement suspected (right lower extremity representation). The more confluent areas correspond to the abnormal  hypodensity on the CT today. There is associated T2 and FLAIR hyperintensity in the affected parenchyma. No associated hemorrhage or mass effect. No contralateral or posterior fossa restricted diffusion. Chronic lacunar infarct of the right caudate. No cortical encephalomalacia. No chronic cerebral blood products. No midline shift, mass effect, evidence of mass lesion, ventriculomegaly, extra-axial collection or acute intracranial hemorrhage. Cervicomedullary junction and pituitary are within normal limits. Vascular: Major intracranial vascular flow voids are preserved. The distal right vertebral artery is dominant. Skull and upper cervical spine: Negative. Sinuses/Orbits: Negative. Other: Visible internal auditory structures appear normal. Trace inferior left mastoid fluid. Negative scalp soft tissues. IMPRESSION: 1. Patchy acute infarcts extending from the body of the corpus callosum posteriorly along the medial left hemisphere, most resembling distal left ACA territory ischemia. No associated hemorrhage or mass effect. 2. Chronic lacunar infarct of the right caudate nucleus. Electronically Signed   By: Genevie Ann M.D.   On: 03/29/2017 09:40   US Carotid Bilateral (at [facility] And [facility] Only)  Result Date: 03/29/2017 CLINICAL DATA:  Acute infarct by MRI 03/29/2017 EXAM: BILATERAL CAROTID DUPLEX ULTRASOUND TECHNIQUE: Pearline Cables scale imaging, color Doppler and duplex ultrasound were performed of bilateral carotid and vertebral arteries in the neck. COMPARISON:  03/29/2017 FINDINGS: Criteria: Quantification of carotid stenosis is based on velocity parameters that correlate the residual internal carotid diameter with NASCET-based stenosis levels, using the diameter of the distal internal carotid lumen as the denominator for stenosis measurement. The following velocity measurements were obtained: RIGHT ICA:  107/32 cm/sec CCA:  28/76 cm/sec SYSTOLIC ICA/CCA RATIO:  1.1 DIASTOLIC ICA/CCA RATIO:  1.7 ECA:  104 cm/sec LEFT ICA:   116/40 cm/sec CCA:  81/15 cm/sec SYSTOLIC ICA/CCA RATIO:  1.6 DIASTOLIC ICA/CCA RATIO:  2.4 ECA:  230 cm/sec RIGHT CAROTID ARTERY: Diffuse heterogeneous carotid intimal thickening and scattered atherosclerosis. Despite this, there is no hemodynamically significant right ICA stenosis, velocity elevation, or turbulent flow. RIGHT VERTEBRAL ARTERY:  Antegrade LEFT CAROTID ARTERY: Similar diffuse carotid intimal thickening and scattered atherosclerosis. No hemodynamically significant left ICA stenosis, velocity elevation, or turbulent flow. LEFT VERTEBRAL ARTERY:  Antegrade IMPRESSION: Moderate bilateral carotid atherosclerosis and intimal thickening. No  hemodynamically significant ICA stenosis. Degree of narrowing less than 50% bilaterally. Patent antegrade vertebral flow bilaterally. Electronically Signed   By: Jerilynn Mages.  Shick M.D.   On: 03/29/2017 15:48       Subjective:  Feeling weak.    Discharge Exam: Vitals:   04/01/17 0345 04/01/17 0745  BP: 137/69 (!) 144/74  Pulse: 85 84  Resp: 18 20  Temp: 97.6 F (36.4 C) 98.5 F (36.9 C)   Vitals:   03/31/17 1945 03/31/17 2345 04/01/17 0345 04/01/17 0745  BP: 125/72 90/60 137/69 (!) 144/74  Pulse: 85 79 85 84  Resp: 16 17 18 20   Temp: 98.8 F (37.1 C) 98.5 F (36.9 C) 97.6 F (36.4 C) 98.5 F (36.9 C)  TempSrc: Oral Oral Oral Oral  SpO2: 99% 98% 99% 98%  Weight:      Height:        General: Pt is alert, awake, not in acute distress Cardiovascular: RRR, S1/S2 +, no rubs, no gallops Respiratory: CTA bilaterally, no wheezing, no rhonchi Abdominal: Soft, NT, ND, bowel sounds + Extremities: no edema, no cyanosis    The results of significant diagnostics from this hospitalization (including imaging, microbiology, ancillary and laboratory) are listed below for reference.     Basic Metabolic Panel:  Recent Labs Lab 03/29/17 0632 04/01/17 0534  NA 135 135  K 3.0* 3.1*  CL 101 101  CO2 25 29  GLUCOSE 142* 107*  BUN 6 14  CREATININE  0.63 0.81  CALCIUM 9.0 8.3*   Liver Function Tests:  Recent Labs Lab 03/29/17 0632 04/01/17 0534  AST 13* 16  ALT 12* 11*  ALKPHOS 146* 110  BILITOT 0.5 0.4  PROT 6.9 5.6*  ALBUMIN 3.3* 2.8*    Recent Labs Lab 03/31/17 1630  LIPASE 22   CBC:  Recent Labs Lab 03/29/17 0632 04/01/17 0534  WBC 10.0 7.3  NEUTROABS 7.3  --   HGB 15.6* 13.0  HCT 44.2 38.2  MCV 110.5* 112.7*  PLT 177 117*   Cardiac Enzymes:  Recent Labs Lab 03/29/17 0632  TROPONINI <0.03   Lipid Profile  Recent Labs  03/30/17 0431  CHOL 201*  HDL 47  LDLCALC 115*  TRIG 197*  CHOLHDL 4.3   Thyroid function studies  Recent Labs  03/30/17 0431  TSH 47.236*   Anemia work up  Recent Labs  03/30/17 0431  VITAMINB12 106*   Urinalysis    Component Value Date/Time   COLORURINE STRAW (A) 03/29/2017 0615   APPEARANCEUR CLEAR 03/29/2017 0615   LABSPEC 1.004 (L) 03/29/2017 0615   PHURINE 7.0 03/29/2017 0615   GLUCOSEU NEGATIVE 03/29/2017 0615   HGBUR NEGATIVE 03/29/2017 0615   BILIRUBINUR NEGATIVE 03/29/2017 0615   KETONESUR NEGATIVE 03/29/2017 0615   PROTEINUR NEGATIVE 03/29/2017 0615   UROBILINOGEN 0.2 09/02/2015 1735   NITRITE NEGATIVE 03/29/2017 0615   LEUKOCYTESUR NEGATIVE 03/29/2017 0615    Time coordinating discharge: Over 30 minutes SIGNED:  Orvan Falconer, MD FACP [facility] 04/01/2017, 1:30 PM   If 7PM-7AM, please contact night-coverage [email or web address] Password [email or web address]

## 2017-04-01 NOTE — Care Management (Signed)
Pt on ACO registry. Referred for enrollment in [place] transition calls for CVA.

## 2017-04-03 ENCOUNTER — Encounter (HOSPITAL_COMMUNITY): Payer: Self-pay | Admitting: *Deleted

## 2017-04-03 ENCOUNTER — Emergency Department (HOSPITAL_COMMUNITY)
Admission: EM | Admit: 2017-04-03 | Discharge: 2017-04-03 | Disposition: A | Discharge status: Home / Self Care | Payer: Medicare Other | Attending: Emergency Medicine | Admitting: Emergency Medicine

## 2017-04-03 ENCOUNTER — Emergency Department (HOSPITAL_COMMUNITY): Payer: Medicare Other

## 2017-04-03 DIAGNOSIS — E039 Hypothyroidism, unspecified: Secondary | ICD-10-CM | POA: Insufficient documentation

## 2017-04-03 DIAGNOSIS — R140 Abdominal distension (gaseous): Secondary | ICD-10-CM | POA: Diagnosis not present

## 2017-04-03 DIAGNOSIS — R51 Headache: Secondary | ICD-10-CM | POA: Diagnosis not present

## 2017-04-03 DIAGNOSIS — Z79899 Other long term (current) drug therapy: Secondary | ICD-10-CM | POA: Diagnosis not present

## 2017-04-03 DIAGNOSIS — R112 Nausea with vomiting, unspecified: Secondary | ICD-10-CM | POA: Diagnosis present

## 2017-04-03 DIAGNOSIS — R1013 Epigastric pain: Secondary | ICD-10-CM | POA: Insufficient documentation

## 2017-04-03 DIAGNOSIS — R531 Weakness: Secondary | ICD-10-CM | POA: Insufficient documentation

## 2017-04-03 DIAGNOSIS — Z7982 Long term (current) use of aspirin: Secondary | ICD-10-CM | POA: Diagnosis not present

## 2017-04-03 DIAGNOSIS — R1111 Vomiting without nausea: Secondary | ICD-10-CM | POA: Diagnosis not present

## 2017-04-03 DIAGNOSIS — R079 Chest pain, unspecified: Secondary | ICD-10-CM | POA: Insufficient documentation

## 2017-04-03 DIAGNOSIS — F17210 Nicotine dependence, cigarettes, uncomplicated: Secondary | ICD-10-CM | POA: Diagnosis not present

## 2017-04-03 DIAGNOSIS — R0789 Other chest pain: Secondary | ICD-10-CM | POA: Diagnosis not present

## 2017-04-03 LAB — CBC
HEMATOCRIT: 41.9 % (ref 36.0–46.0)
HEMOGLOBIN: 14.3 g/dL (ref 12.0–15.0)
MCH: 38 pg — AB (ref 26.0–34.0)
MCHC: 34.1 g/dL (ref 30.0–36.0)
MCV: 111.4 fL — ABNORMAL HIGH (ref 78.0–100.0)
Platelets: 129 10*3/uL — ABNORMAL LOW (ref 150–400)
RBC: 3.76 MIL/uL — ABNORMAL LOW (ref 3.87–5.11)
RDW: 14.1 % (ref 11.5–15.5)
WBC: 7 10*3/uL (ref 4.0–10.5)

## 2017-04-03 LAB — COMPREHENSIVE METABOLIC PANEL
ALBUMIN: 3.2 g/dL — AB (ref 3.5–5.0)
ALT: 14 U/L (ref 14–54)
AST: 27 U/L (ref 15–41)
Alkaline Phosphatase: 127 U/L — ABNORMAL HIGH (ref 38–126)
Anion gap: 7 (ref 5–15)
BILIRUBIN TOTAL: 1 mg/dL (ref 0.3–1.2)
BUN: 12 mg/dL (ref 6–20)
CO2: 29 mmol/L (ref 22–32)
Calcium: 8.9 mg/dL (ref 8.9–10.3)
Chloride: 100 mmol/L — ABNORMAL LOW (ref 101–111)
Creatinine, Ser: 0.67 mg/dL (ref 0.44–1.00)
GFR calc Af Amer: >60 mL/min (ref >60)
GFR calc non Af Amer: >60 mL/min (ref >60)
Glucose, Bld: 134 mg/dL — ABNORMAL HIGH (ref 65–99)
POTASSIUM: 3.8 mmol/L (ref 3.5–5.1)
Sodium: 136 mmol/L (ref 135–145)
TOTAL PROTEIN: 6.5 g/dL (ref 6.5–8.1)

## 2017-04-03 LAB — URINALYSIS, ROUTINE W REFLEX MICROSCOPIC
Bilirubin Urine: NEGATIVE
GLUCOSE, UA: NEGATIVE mg/dL
HGB URINE DIPSTICK: NEGATIVE
Ketones, ur: NEGATIVE mg/dL
Leukocytes, UA: NEGATIVE
Nitrite: NEGATIVE
PROTEIN: NEGATIVE mg/dL
Specific Gravity, Urine: 1.02 (ref 1.005–1.030)
pH: 6 (ref 5.0–8.0)

## 2017-04-03 LAB — I-STAT TROPONIN, ED: Troponin i, poc: 0 ng/mL (ref 0.00–0.08)

## 2017-04-03 LAB — LIPASE, BLOOD: Lipase: 21 U/L (ref 11–51)

## 2017-04-03 MED ORDER — SODIUM CHLORIDE 0.9 % IV SOLN
INTRAVENOUS | Status: DC
  Administered 2017-04-03: 12:00:00 via INTRAVENOUS

## 2017-04-03 MED ORDER — HYDROMORPHONE HCL 1 MG/ML IJ SOLN
1.0000 mg | Freq: Once | INTRAMUSCULAR | Status: AC
  Administered 2017-04-03: 1 mg via INTRAVENOUS
  Filled 2017-04-03: qty 1

## 2017-04-03 MED ORDER — ONDANSETRON 4 MG PO TBDP: 4.0000 mg | ORAL_TABLET | Freq: Three times a day (TID) | ORAL | 3 refills | Status: DC | PRN

## 2017-04-03 MED ORDER — ONDANSETRON HCL 4 MG/2ML IJ SOLN
4.0000 mg | Freq: Once | INTRAMUSCULAR | Status: AC | PRN
  Administered 2017-04-03: 4 mg via INTRAVENOUS
  Filled 2017-04-03: qty 2

## 2017-04-03 MED ORDER — ONDANSETRON HCL 4 MG/2ML IJ SOLN
4.0000 mg | Freq: Once | INTRAMUSCULAR | Status: AC
  Administered 2017-04-03: 4 mg via INTRAVENOUS
  Filled 2017-04-03: qty 2

## 2017-04-03 NOTE — ED Provider Notes (Signed)
[facility] DEPT Provider Note   CSN: [id number] Arrival date & time: 04/03/17  1005  By signing my name below, I, Jaquelyn Bitter., attest that this documentation has been prepared under the direction and in the presence of Fredia Sorrow, MD. Electronically signed: Jaquelyn Bitter., ED Scribe. 04/03/17. 1:48 PM.    History   Chief Complaint Chief Complaint  Patient presents with  . Chest Pain  . Emesis    HPI Deborah Russo is a 56 y.o. female with hx of chronic migraines who presents to the Emergency Department complaining of emesis with onset x1 week. Pt states that she has had experienced nausea and vomiting for the past x1 week. She reportedly has had x3 episodes of vomiting today.  Pt states that she has chronic abdominal pain that has been worse since she has been unable to take her pain medication. Pt reports intermittent chest pain for the past x1 week, appetite change, nausea, vomiting. She denies any modifying factors. Pt denies hematemesis, diarrhea. Of note, pt recently had a stroke x1 week ago impacting her R side. She was recently discharged from the ED x2 days ago after a x5 day stay. Pt's PCP is Dr. Evette Doffing at [facility].   The history is provided by the patient. No language interpreter was used.    Past Medical History:  Diagnosis Date  . Chronic abdominal pain    since approximately 1991  . Chronic migraine   . Chronic pain   . Hypothyroidism   . Nausea, vomiting, and diarrhea    recurrent, chronic  . Pain management   . Sleep apnea    has CPAP, but doesn't wear it bc she says "i can't sleep with it on"    Patient Active Problem List   Diagnosis Date Noted  . Acute CVA (cerebrovascular accident) ([place]) 03/29/2017  . Tobacco abuse 03/29/2017  . HTN (hypertension) 03/29/2017  . Aortic atherosclerosis ([place]) 09/29/2016  . Chronic migraine 09/29/2016  . H/O Clostridium difficile infection 07/13/2014  .  Diarrhea 07/13/2014  . FTT (failure to thrive) in adult 07/13/2014  . Abdominal pain, unspecified site 07/13/2014  . Chronic abdominal pain   . Abdominal pain 06/19/2014  . Hypothyroidism 06/19/2014  . Hypokalemia 06/19/2014  . Chronic pain 06/19/2014  . Anemia 06/19/2014  . Abscess of axilla, left 03/28/2013    Past Surgical History:  Procedure Laterality Date  . ABDOMINAL HYSTERECTOMY    . ABDOMINAL SURGERY    . BIOPSY N/A 07/17/2014   Procedure: BIOPSY;  Surgeon: Rogene Houston, MD;  Location: [facility] ORS;  Service: Endoscopy;  Laterality: N/A;  . BREAST SURGERY Left    removed nipple  . CESAREAN SECTION    . CHOLECYSTECTOMY    . ESOPHAGOGASTRODUODENOSCOPY N/A 07/17/2014   Procedure: ESOPHAGOGASTRODUODENOSCOPY (EGD);  Surgeon: Rogene Houston, MD;  Location: [facility];  Service: Endoscopy;  Laterality: N/A;    OB History    Gravida Para Term Preterm AB Living   2 1 1   1      SAB TAB Ectopic Multiple Live Births     1             Home Medications    Prior to Admission medications   Medication Sig Start Date End Date Taking? Authorizing Provider  aspirin 325 MG tablet Take 1 tablet (325 mg total) by mouth daily. 04/02/17  Yes Orvan Falconer, MD  diazepam (VALIUM) 5 MG tablet Take 5 mg by mouth 3 (  three) times daily.   Yes Historical Provider, MD  levothyroxine (SYNTHROID, LEVOTHROID) 200 MCG tablet Take 1 tablet (200 mcg total) by mouth daily before breakfast. Patient taking differently: Take 175 mcg by mouth daily before breakfast.  04/02/17  Yes Orvan Falconer, MD  lisinopril (PRINIVIL,ZESTRIL) 10 MG tablet TAKE 1 TABLET DAILY 03/14/17  Yes Eustaquio Maize, MD  nicotine (NICODERM CQ - DOSED IN MG/24 HOURS) 21 mg/24hr patch Place 1 patch (21 mg total) onto the skin daily. 04/02/17  Yes Orvan Falconer, MD  omeprazole (PRILOSEC) 20 MG capsule TAKE 1 TABLET DAILY 02/08/17  Yes Eustaquio Maize, MD  ondansetron (ZOFRAN) 4 MG tablet Take 1 tablet (4 mg total) by mouth 2 (two) times daily as needed for  nausea or vomiting. 10/04/16  Yes Eustaquio Maize, MD  oxyCODONE (OXYCONTIN) 80 mg 12 hr tablet Take 1 tablet (80 mg total) by mouth every 12 (twelve) hours. 04/01/17  Yes Orvan Falconer, MD  oxyCODONE-acetaminophen (PERCOCET) 10-325 MG tablet Take 1 tablet by mouth 2 (two) times daily.   Yes Historical Provider, MD  pravastatin (PRAVACHOL) 20 MG tablet TAKE 1 TABLET DAILY 03/14/17  Yes Eustaquio Maize, MD  senna-docusate (SENOKOT-S) 8.6-50 MG tablet Take 1 tablet by mouth at bedtime as needed for mild constipation. 04/01/17  Yes Orvan Falconer, MD  sucralfate (CARAFATE) 1 g tablet TAKE  (1)  TABLET  THREE TIMES DAILY AS NEEDED. 01/08/17  Yes Eustaquio Maize, MD  ondansetron (ZOFRAN ODT) 4 MG disintegrating tablet Take 1 tablet (4 mg total) by mouth every 8 (eight) hours as needed. 04/03/17   Fredia Sorrow, MD    Family History Family History  Problem Relation Age of Onset  . Asthma Mother   . Cancer Father     Social History Social History  Substance Use Topics  . Smoking status: Current Every Day Smoker    Packs/day: 1.00    Years: 41.00    Types: Cigarettes  . Smokeless tobacco: Never Used  . Alcohol use No     Allergies   Penicillins and Sulfa antibiotics   Review of Systems Review of Systems  Constitutional: Negative for fever.  HENT: Negative for rhinorrhea and sore throat.   Eyes: Negative for visual disturbance.  Respiratory: Negative for cough and shortness of breath.   Cardiovascular: Positive for chest pain. Negative for leg swelling.  Gastrointestinal: Positive for abdominal pain, nausea and vomiting.  Genitourinary: Negative for dysuria and hematuria.  Skin: Negative for rash.  Neurological: Positive for weakness and headaches.  Hematological: Does not bruise/bleed easily.     Physical Exam Updated Vital Signs BP 107/83   Pulse 78   Temp 98.1 F (36.7 C) (Oral)   Resp 12   Ht 5' 2"  (1.575 m)   Wt 74.8 kg   SpO2 94%   BMI 30.18 kg/m   Physical Exam    Constitutional: She is oriented to person, place, and time. She appears well-developed and well-nourished. No distress.  HENT:  Head: Normocephalic and atraumatic.  Eyes: Conjunctivae and EOM are normal. Pupils are equal, round, and reactive to light.  Pupils are normal, sclera   Neck: Neck supple.  Cardiovascular: Normal rate and regular rhythm.   No murmur heard. Pulmonary/Chest: Effort normal and breath sounds normal. No respiratory distress.  Abdominal: Soft. She exhibits distension. Bowel sounds are decreased. There is no tenderness.  Musculoskeletal: She exhibits no edema.  Neurological: She is alert and oriented to person, place, and time. She displays normal  reflexes. No cranial nerve deficit or sensory deficit. She exhibits abnormal muscle tone. Coordination abnormal.  Noted weakness in the R arm and R leg.  Skin: Skin is warm and dry.  Psychiatric: She has a normal mood and affect.  Nursing note and vitals reviewed.    ED Treatments / Results   DIAGNOSTIC STUDIES: Oxygen Saturation is 98% on RA, inadequate by my interpretation.   COORDINATION OF CARE: 1:48 PM-Discussed next steps with pt. Pt verbalized understanding and is agreeable with the plan.    Labs (all labs ordered are listed, but only abnormal results are displayed) Labs Reviewed  CBC - Abnormal; Notable for the following:       Result Value   RBC 3.76 (*)    MCV 111.4 (*)    MCH 38.0 (*)    Platelets 129 (*)    All other components within normal limits  COMPREHENSIVE METABOLIC PANEL - Abnormal; Notable for the following:    Chloride 100 (*)    Glucose, Bld 134 (*)    Albumin 3.2 (*)    Alkaline Phosphatase 127 (*)    All other components within normal limits  URINALYSIS, ROUTINE W REFLEX MICROSCOPIC - Abnormal; Notable for the following:    APPearance HAZY (*)    All other components within normal limits  LIPASE, BLOOD  I-STAT TROPOININ, ED   Results for orders placed or performed during the  hospital encounter of 04/03/17  CBC  Result Value Ref Range   WBC 7.0 4.0 - 10.5 K/uL   RBC 3.76 (L) 3.87 - 5.11 MIL/uL   Hemoglobin 14.3 12.0 - 15.0 g/dL   HCT 41.9 36.0 - 46.0 %   MCV 111.4 (H) 78.0 - 100.0 fL   MCH 38.0 (H) 26.0 - 34.0 pg   MCHC 34.1 30.0 - 36.0 g/dL   RDW 14.1 11.5 - 15.5 %   Platelets 129 (L) 150 - 400 K/uL  Lipase, blood  Result Value Ref Range   Lipase 21 11 - 51 U/L  Comprehensive metabolic panel  Result Value Ref Range   Sodium 136 135 - 145 mmol/L   Potassium 3.8 3.5 - 5.1 mmol/L   Chloride 100 (L) 101 - 111 mmol/L   CO2 29 22 - 32 mmol/L   Glucose, Bld 134 (H) 65 - 99 mg/dL   BUN 12 6 - 20 mg/dL   Creatinine, Ser 0.67 0.44 - 1.00 mg/dL   Calcium 8.9 8.9 - 10.3 mg/dL   Total Protein 6.5 6.5 - 8.1 g/dL   Albumin 3.2 (L) 3.5 - 5.0 g/dL   AST 27 15 - 41 U/L   ALT 14 14 - 54 U/L   Alkaline Phosphatase 127 (H) 38 - 126 U/L   Total Bilirubin 1.0 0.3 - 1.2 mg/dL   GFR calc non Af Amer >60 >60 mL/min   GFR calc Af Amer >60 >60 mL/min   Anion gap 7 5 - 15  Urinalysis, Routine w reflex microscopic  Result Value Ref Range   Color, Urine YELLOW YELLOW   APPearance HAZY (A) CLEAR   Specific Gravity, Urine 1.020 1.005 - 1.030   pH 6.0 5.0 - 8.0   Glucose, UA NEGATIVE NEGATIVE mg/dL   Hgb urine dipstick NEGATIVE NEGATIVE   Bilirubin Urine NEGATIVE NEGATIVE   Ketones, ur NEGATIVE NEGATIVE mg/dL   Protein, ur NEGATIVE NEGATIVE mg/dL   Nitrite NEGATIVE NEGATIVE   Leukocytes, UA NEGATIVE NEGATIVE  I-stat troponin, ED  Result Value Ref Range   Troponin i,  poc 0.00 0.00 - 0.08 ng/mL   Comment 3             EKG  EKG Interpretation  Date/Time:  Sunday April 03 2017 10:10:44 EDT Ventricular Rate:  86 PR Interval:    QRS Duration: 91 QT Interval:  421 QTC Calculation: 504 R Axis:   63 Text Interpretation:  Sinus rhythm Probable left atrial enlargement Nonspecific T abnormalities, lateral leads Borderline prolonged QT interval Baseline wander in  lead(s) I aVR aVL V1 V4 V5 V6 Confirmed by Zebediah Beezley  MD, Rashan Patient ([id number]) on 04/03/2017 11:26:31 AM       Radiology Dg Chest 2 View  Result Date: 04/03/2017 CLINICAL DATA:  Chest pain.  Nausea and vomiting. EXAM: CHEST  2 VIEW COMPARISON:  Acute abdominal series 10/04/2016. Two-view chest x-ray 08/08/2016 FINDINGS: The heart is mildly enlarged. Atherosclerotic changes are noted at the aortic arch. Changes of COPD are again noted. There is no edema or effusion. No focal airspace disease is present. The visualized soft tissues and bony thorax are unremarkable. IMPRESSION: 1. No acute cardiopulmonary disease or significant interval change. Electronically Signed   By: San Morelle M.D.   On: 04/03/2017 11:33    Procedures Procedures (including critical care time)  Medications Ordered in ED Medications  0.9 %  sodium chloride infusion ( Intravenous New Bag/Given 04/03/17 1149)  HYDROmorphone (DILAUDID) injection 1 mg (not administered)  ondansetron (ZOFRAN) injection 4 mg (4 mg Intravenous Given 04/03/17 1028)  ondansetron (ZOFRAN) injection 4 mg (4 mg Intravenous Given 04/03/17 1203)  HYDROmorphone (DILAUDID) injection 1 mg (1 mg Intravenous Given 04/03/17 1147)     Initial Impression / Assessment and Plan / ED Course  I have reviewed the triage vital signs and the nursing notes.  Pertinent labs & imaging results that were available during my care of the patient were reviewed by me and considered in my medical decision making (see chart for details).    Patient with chronic abdominal pain. Patient's had increased difficulty with nausea and vomiting. Unable to take her pain medication. Pain is epigastric radiate some into the chest. Patient's lab workup here no leukocytosis no liver function test abnormalities. No evidence of pancreatitis. And troponin is negative. Symptoms of them present for a few days. Patient more comfortable and vomiting controlled with Zofran and more comfortable with  pain medicine hydromorphone.  Feel that patient if vomiting can be controlled at home will able to continue on her chronic pain medicine.     Final Clinical Impressions(s) / ED Diagnoses   Final diagnoses:  Epigastric pain    New Prescriptions New Prescriptions   ONDANSETRON (ZOFRAN ODT) 4 MG DISINTEGRATING TABLET    Take 1 tablet (4 mg total) by mouth every 8 (eight) hours as needed.   I personally performed the services described in this documentation, which was scribed in my presence. The recorded information has been reviewed and is accurate.       Fredia Sorrow, MD 04/03/17 1350

## 2017-04-03 NOTE — ED Triage Notes (Signed)
Pt brought in by [facility] with c/o nausea/vomiting. Pt was in the hospital recently for CVA. Pt reports the symptoms started last night and states, "I just don't feel good". Pt hasn't had BM in 1 week. Possible impaction, pt on opioids at home. Pt started c/o chest discomfort on ride to hospital. EMS done EKG - ST, HR 102 on monitor. Pt took 375m ASA at home prior to arrival.

## 2017-04-03 NOTE — Discharge Instructions (Signed)
Workup for the epigastric abdominal pain without any acute findings. Feel that part of the problem was inability to take pain medicine and has a history of chronic pain. Inability to take it due to the nausea and vomiting. It is important to try to control the nausea and vomiting trial of Zofran ODT if unsuccessful return. Return for any new or worse symptoms. Make an plan follow the record Dr.

## 2017-04-08 ENCOUNTER — Other Ambulatory Visit: Payer: Self-pay | Admitting: *Deleted

## 2017-04-08 NOTE — Patient Outreach (Signed)
Patient triggered Red on EMMI Stroke Dashboard.  Notification sent to: Sherrin Daisy, RN  Forde Radon Conmer

## 2017-04-08 NOTE — Patient Outreach (Signed)
[facility]) Care Management  04/08/2017  Deborah Russo Jul 15, 1961 [id number]  Received EMMI-Stroke referral 04/20 for red dashboard 04/19 of feeling worse. ? Questions/meds-day 3:  Telephone call to patient who was advised of reason for call. HIPPA verification received. Patient states she is not feeling any worse and that she still has right sided weakness that requires using a walker. Voices that she does not have any questions. States she is taking medications as prescribed by her MD. Deborah Russo that she has a hospital follow up appointment scheduled with primary care provider 04/11/17 and states she has transportation to appointment. Advised patient to follow up with neurology office if she has not received call to set up follow up appointment. Patient voices understanding & states she will follow up with office if no calls in next several weeks. States she has contact information.   Patient voices she will call 911 if symptoms of stroke occur. States she received stroke educational information & has been reading it since hospital discharge.   EMMI call completed.  Plan: Send falls prevention educational literature to patient. Send to care management assistant to close case out.  Deborah Daisy, RN BSN CCM Care Management Coordinator [facility] Care Management  [phone] .

## 2017-04-11 ENCOUNTER — Encounter: Payer: Self-pay | Admitting: *Deleted

## 2017-04-11 ENCOUNTER — Ambulatory Visit: Payer: Medicare Other | Admitting: Pediatrics

## 2017-04-14 ENCOUNTER — Ambulatory Visit: Payer: Medicare Other | Admitting: Pediatrics

## 2017-04-22 ENCOUNTER — Ambulatory Visit: Payer: Medicare Other | Admitting: Pediatrics

## 2017-04-27 ENCOUNTER — Encounter (HOSPITAL_COMMUNITY): Payer: Self-pay | Admitting: Cardiology

## 2017-04-27 ENCOUNTER — Emergency Department (HOSPITAL_COMMUNITY)
Admission: EM | Admit: 2017-04-27 | Discharge: 2017-04-27 | Disposition: A | Discharge status: Home / Self Care | Payer: Medicare Other | Attending: Emergency Medicine | Admitting: Emergency Medicine

## 2017-04-27 ENCOUNTER — Emergency Department (HOSPITAL_COMMUNITY): Payer: Medicare Other

## 2017-04-27 DIAGNOSIS — I10 Essential (primary) hypertension: Secondary | ICD-10-CM | POA: Diagnosis not present

## 2017-04-27 DIAGNOSIS — G43909 Migraine, unspecified, not intractable, without status migrainosus: Secondary | ICD-10-CM | POA: Diagnosis not present

## 2017-04-27 DIAGNOSIS — R42 Dizziness and giddiness: Secondary | ICD-10-CM | POA: Diagnosis not present

## 2017-04-27 DIAGNOSIS — I639 Cerebral infarction, unspecified: Secondary | ICD-10-CM | POA: Diagnosis not present

## 2017-04-27 DIAGNOSIS — E039 Hypothyroidism, unspecified: Secondary | ICD-10-CM | POA: Insufficient documentation

## 2017-04-27 DIAGNOSIS — F17210 Nicotine dependence, cigarettes, uncomplicated: Secondary | ICD-10-CM | POA: Insufficient documentation

## 2017-04-27 DIAGNOSIS — K2950 Unspecified chronic gastritis without bleeding: Secondary | ICD-10-CM | POA: Diagnosis not present

## 2017-04-27 DIAGNOSIS — Z7982 Long term (current) use of aspirin: Secondary | ICD-10-CM | POA: Insufficient documentation

## 2017-04-27 DIAGNOSIS — G8929 Other chronic pain: Secondary | ICD-10-CM

## 2017-04-27 DIAGNOSIS — R109 Unspecified abdominal pain: Secondary | ICD-10-CM

## 2017-04-27 DIAGNOSIS — R202 Paresthesia of skin: Secondary | ICD-10-CM

## 2017-04-27 DIAGNOSIS — R52 Pain, unspecified: Secondary | ICD-10-CM | POA: Diagnosis not present

## 2017-04-27 DIAGNOSIS — Z79899 Other long term (current) drug therapy: Secondary | ICD-10-CM | POA: Diagnosis not present

## 2017-04-27 HISTORY — DX: Cerebral infarction, unspecified: I63.9

## 2017-04-27 LAB — CBC WITH DIFFERENTIAL/PLATELET
BASOS ABS: 0 10*3/uL (ref 0.0–0.1)
BASOS PCT: 0 %
EOS ABS: 0.1 10*3/uL (ref 0.0–0.7)
Eosinophils Relative: 1 %
HCT: 42.7 % (ref 36.0–46.0)
Hemoglobin: 14.7 g/dL (ref 12.0–15.0)
Lymphocytes Relative: 26 %
Lymphs Abs: 1.9 10*3/uL (ref 0.7–4.0)
MCH: 37.2 pg — ABNORMAL HIGH (ref 26.0–34.0)
MCHC: 34.4 g/dL (ref 30.0–36.0)
MCV: 108.1 fL — ABNORMAL HIGH (ref 78.0–100.0)
MONO ABS: 0.2 10*3/uL (ref 0.1–1.0)
MONOS PCT: 2 %
Neutro Abs: 5.2 10*3/uL (ref 1.7–7.7)
Neutrophils Relative %: 71 %
PLATELETS: 174 10*3/uL (ref 150–400)
RBC: 3.95 MIL/uL (ref 3.87–5.11)
RDW: 12.9 % (ref 11.5–15.5)
WBC: 7.4 10*3/uL (ref 4.0–10.5)

## 2017-04-27 LAB — LIPASE, BLOOD: LIPASE: 24 U/L (ref 11–51)

## 2017-04-27 LAB — COMPREHENSIVE METABOLIC PANEL
ALBUMIN: 3.6 g/dL (ref 3.5–5.0)
ALT: 17 U/L (ref 14–54)
ANION GAP: 10 (ref 5–15)
AST: 22 U/L (ref 15–41)
Alkaline Phosphatase: 136 U/L — ABNORMAL HIGH (ref 38–126)
BUN: 6 mg/dL (ref 6–20)
CHLORIDE: 107 mmol/L (ref 101–111)
CO2: 24 mmol/L (ref 22–32)
Calcium: 9.8 mg/dL (ref 8.9–10.3)
Creatinine, Ser: 0.73 mg/dL (ref 0.44–1.00)
GFR calc Af Amer: >60 mL/min (ref >60)
GFR calc non Af Amer: >60 mL/min (ref >60)
GLUCOSE: 119 mg/dL — AB (ref 65–99)
Potassium: 3.8 mmol/L (ref 3.5–5.1)
SODIUM: 141 mmol/L (ref 135–145)
TOTAL PROTEIN: 7.3 g/dL (ref 6.5–8.1)
Total Bilirubin: 0.7 mg/dL (ref 0.3–1.2)

## 2017-04-27 LAB — TROPONIN I: Troponin I: <0.03 ng/mL (ref <0.03)

## 2017-04-27 MED ORDER — SODIUM CHLORIDE 0.9 % IV BOLUS (SEPSIS)
1000.0000 mL | Freq: Once | INTRAVENOUS | Status: AC
  Administered 2017-04-27: 1000 mL via INTRAVENOUS

## 2017-04-27 MED ORDER — GABAPENTIN 100 MG PO CAPS: 100.0000 mg | ORAL_CAPSULE | Freq: Three times a day (TID) | ORAL | 0 refills | Status: DC

## 2017-04-27 MED ORDER — MORPHINE SULFATE (PF) 4 MG/ML IV SOLN
4.0000 mg | Freq: Once | INTRAVENOUS | Status: AC
  Administered 2017-04-27: 4 mg via INTRAVENOUS
  Filled 2017-04-27: qty 1

## 2017-04-27 MED ORDER — ONDANSETRON HCL 4 MG/2ML IJ SOLN
4.0000 mg | Freq: Once | INTRAMUSCULAR | Status: AC
  Administered 2017-04-27: 4 mg via INTRAVENOUS
  Filled 2017-04-27: qty 2

## 2017-04-27 MED ORDER — SUCRALFATE 1 GM/10ML PO SUSP
1.0000 g | Freq: Once | ORAL | Status: AC
  Administered 2017-04-27: 1 g via ORAL
  Filled 2017-04-27: qty 10

## 2017-04-27 MED ORDER — FAMOTIDINE 20 MG PO TABS: 20.0000 mg | ORAL_TABLET | Freq: Two times a day (BID) | ORAL | 0 refills | Status: DC

## 2017-04-27 MED ORDER — ONDANSETRON HCL 4 MG PO TABS: 4.0000 mg | ORAL_TABLET | Freq: Four times a day (QID) | ORAL | 0 refills | Status: DC | PRN

## 2017-04-27 MED ORDER — GI COCKTAIL ~~LOC~~
30.0000 mL | Freq: Once | ORAL | Status: AC
  Administered 2017-04-27: 30 mL via ORAL
  Filled 2017-04-27: qty 30

## 2017-04-27 NOTE — ED Triage Notes (Signed)
Pt states she fell this morning in the bathroom and hit her head.

## 2017-04-27 NOTE — ED Provider Notes (Signed)
[facility] DEPT Provider Note   CSN: [id number] Arrival date & time: 04/27/17  0315   By signing my name below, I, Deborah Russo, attest that this documentation has been prepared under the direction and in the presence of Julianne Rice, MD. Electronically Signed: Hilbert Russo, Scribe. 04/27/17. 9:39 AM. History   Chief Complaint Chief Complaint  Patient presents with  . Numbness    The history is provided by the patient. No language interpreter was used.  Weakness  Primary symptoms include loss of sensation.  Primary symptoms include no dizziness. This is a new problem. The current episode started yesterday. There was right facial, right upper extremity and right lower extremity focality noted. There has been no fever. Associated symptoms include vomiting. Pertinent negatives include no shortness of breath, no chest pain and no headaches. There were no medications administered prior to arrival. Associated medical issues include CVA.  HPI Comments: Deborah Russo is a 56 y.o. female brought in by ambulance, who presents to the Emergency Department complaining of right-sided numbness since last night. She states that she had a CVA  that affected her right side around 3 to 4 weeks ago. She has been regaining her sensation on the right side and had almost regained full sensation when she began to experience some right-sided numbness again last night. She denies any new weakness. She reports multiple episodes of vomit since last night. She reports 1 episode of diarrhea since last night. She also reports some upper abdominal pain. She took a dose of ibuprofen last night with no significant relief. She denies any visual changes, blood in stool, blood in vomit.  Past Medical History:  Diagnosis Date  . Chronic abdominal pain    since approximately 1991  . Chronic migraine   . Chronic pain   . Hypothyroidism   . Nausea, vomiting, and diarrhea    recurrent, chronic  . Pain  management   . Sleep apnea    has CPAP, but doesn't wear it bc she says "i can't sleep with it on"  . Stroke [facility])     Patient Active Problem List   Diagnosis Date Noted  . Acute CVA (cerebrovascular accident) ([place]) 03/29/2017  . Tobacco abuse 03/29/2017  . HTN (hypertension) 03/29/2017  . Aortic atherosclerosis ([place]) 09/29/2016  . Chronic migraine 09/29/2016  . H/O Clostridium difficile infection 07/13/2014  . Diarrhea 07/13/2014  . FTT (failure to thrive) in adult 07/13/2014  . Abdominal pain, unspecified site 07/13/2014  . Chronic abdominal pain   . Abdominal pain 06/19/2014  . Hypothyroidism 06/19/2014  . Hypokalemia 06/19/2014  . Chronic pain 06/19/2014  . Anemia 06/19/2014  . Abscess of axilla, left 03/28/2013    Past Surgical History:  Procedure Laterality Date  . ABDOMINAL HYSTERECTOMY    . ABDOMINAL SURGERY    . BIOPSY N/A 07/17/2014   Procedure: BIOPSY;  Surgeon: Rogene Houston, MD;  Location: [facility] ORS;  Service: Endoscopy;  Laterality: N/A;  . BREAST SURGERY Left    removed nipple  . CESAREAN SECTION    . CHOLECYSTECTOMY    . ESOPHAGOGASTRODUODENOSCOPY N/A 07/17/2014   Procedure: ESOPHAGOGASTRODUODENOSCOPY (EGD);  Surgeon: Rogene Houston, MD;  Location: [facility] ORS;  Service: Endoscopy;  Laterality: N/A;    OB History    Gravida Para Term Preterm AB Living   2 1 1   1      SAB TAB Ectopic Multiple Live Births     1  Home Medications    Prior to Admission medications   Medication Sig Start Date End Date Taking? Authorizing Provider  aspirin 325 MG tablet Take 1 tablet (325 mg total) by mouth daily. 04/02/17  Yes Orvan Falconer, MD  baclofen (LIORESAL) 10 MG tablet Take 10 mg by mouth daily. 04/09/17  Yes [provider]  diazepam (VALIUM) 5 MG tablet Take 5 mg by mouth 3 (three) times daily.   Yes [provider]  levothyroxine (SYNTHROID, LEVOTHROID) 200 MCG tablet Take 1 tablet (200 mcg total) by mouth daily before breakfast. 04/02/17   Yes Orvan Falconer, MD  lisinopril (PRINIVIL,ZESTRIL) 10 MG tablet TAKE 1 TABLET DAILY 03/14/17  Yes Eustaquio Maize, MD  omeprazole (PRILOSEC) 20 MG capsule TAKE 1 TABLET DAILY 02/08/17  Yes Eustaquio Maize, MD  ondansetron (ZOFRAN ODT) 4 MG disintegrating tablet Take 1 tablet (4 mg total) by mouth every 8 (eight) hours as needed. 04/03/17  Yes Fredia Sorrow, MD  oxyCODONE (OXYCONTIN) 80 mg 12 hr tablet Take 1 tablet (80 mg total) by mouth every 12 (twelve) hours. 04/01/17  Yes Orvan Falconer, MD  oxyCODONE-acetaminophen (PERCOCET) 10-325 MG tablet Take 1 tablet by mouth 2 (two) times daily.   Yes [provider]  pravastatin (PRAVACHOL) 20 MG tablet TAKE 1 TABLET DAILY 03/14/17  Yes Eustaquio Maize, MD  senna-docusate (SENOKOT-S) 8.6-50 MG tablet Take 1 tablet by mouth at bedtime as needed for mild constipation. 04/01/17  Yes Orvan Falconer, MD  sucralfate (CARAFATE) 1 g tablet TAKE  (1)  TABLET  THREE TIMES DAILY AS NEEDED. Patient taking differently: TAKE  (1)  TABLET  THREE TIMES DAILY AS NEEDED FOR STOMACH PAIN 01/08/17  Yes Eustaquio Maize, MD  SUMAtriptan (IMITREX) 100 MG tablet Take 100 mg by mouth daily as needed for migraine. 04/09/17  Yes [provider]  famotidine (PEPCID) 20 MG tablet Take 1 tablet (20 mg total) by mouth 2 (two) times daily. 04/27/17   Julianne Rice, MD  gabapentin (NEURONTIN) 100 MG capsule Take 1 capsule (100 mg total) by mouth 3 (three) times daily. 04/27/17   Julianne Rice, MD  nicotine (NICODERM CQ - DOSED IN MG/24 HOURS) 21 mg/24hr patch Place 1 patch (21 mg total) onto the skin daily. Patient not taking: Reported on 04/27/2017 04/02/17   Orvan Falconer, MD  ondansetron [facility]) 4 MG tablet Take 1 tablet (4 mg total) by mouth every 6 (six) hours as needed for nausea or vomiting. 04/27/17   Julianne Rice, MD    Family History Family History  Problem Relation Age of Onset  . Asthma Mother   . Cancer Father     Social History Social History  Substance Use  Topics  . Smoking status: Current Every Day Smoker    Packs/day: 1.00    Years: 41.00    Types: Cigarettes  . Smokeless tobacco: Never Used  . Alcohol use No     Allergies   Penicillins and Sulfa antibiotics   Review of Systems Review of Systems  Constitutional: Negative for chills and fever.  HENT: Negative for facial swelling and trouble swallowing.   Eyes: Negative for visual disturbance.  Respiratory: Negative for cough, shortness of breath and wheezing.   Cardiovascular: Negative for chest pain, palpitations and leg swelling.  Gastrointestinal: Positive for abdominal pain, diarrhea, nausea and vomiting. Negative for blood in stool and constipation.  Genitourinary: Negative for dysuria, flank pain and frequency.  Musculoskeletal: Positive for arthralgias. Negative for back pain, myalgias and neck pain.  Skin: Negative for rash and wound.  Neurological: Positive for speech difficulty, weakness and numbness (Right-sided). Negative for dizziness, light-headedness and headaches.  All other systems reviewed and are negative.    Physical Exam Updated Vital Signs BP (!) 143/98 (BP Location: Right Arm)   Pulse 82   Temp 98.2 F (36.8 C) (Oral)   Resp 20   Ht 5' 2"  (1.575 m)   Wt 165 lb (74.8 kg)   SpO2 100%   BMI 30.18 kg/m   Physical Exam  Constitutional: She is oriented to person, place, and time. She appears well-developed and well-nourished. No distress.  HENT:  Head: Normocephalic and atraumatic.  Mouth/Throat: Oropharynx is clear and moist. No oropharyngeal exudate.  Eyes: EOM are normal. Pupils are equal, round, and reactive to light.  Neck: Normal range of motion. Neck supple. No JVD present.  Cardiovascular: Normal rate, regular rhythm and normal heart sounds.  Exam reveals no gallop and no friction rub.   No murmur heard. Pulmonary/Chest: Effort normal and breath sounds normal. No respiratory distress. She has no wheezes. She has no rales. She exhibits no  tenderness.  Abdominal: Soft. Bowel sounds are normal. She exhibits no distension. There is tenderness. There is no rebound and no guarding.  Patient with epigastric and left upper quadrant tenderness to palpation. No rebound or guarding.  Musculoskeletal: Normal range of motion. She exhibits no edema or tenderness.  No lower extremity swelling, asymmetry or tenderness.  Neurological: She is alert and oriented to person, place, and time.  Patient complains of mild decrease in sensation to the right face right upper extremity and right lower extremity. She has 4/5 motor in right upper and right lower extremities which she states is normal. Bilateral finger-to-nose is intact.  Skin: Skin is warm and dry. Capillary refill takes less than 2 seconds. No rash noted. She is not diaphoretic. No erythema.  Psychiatric: She has a normal mood and affect. Her behavior is normal. Judgment normal.  Nursing note and vitals reviewed.    ED Treatments / Results  DIAGNOSTIC STUDIES: Oxygen Saturation is 98% on RA, normal by my interpretation.    COORDINATION OF CARE: 9:06 AM Discussed treatment plan with pt at bedside and pt agreed to plan. I will check her labs, CT head, and EKG.  Labs (all labs ordered are listed, but only abnormal results are displayed) Labs Reviewed  CBC WITH DIFFERENTIAL/PLATELET - Abnormal; Notable for the following:       Result Value   MCV 108.1 (*)    MCH 37.2 (*)    All other components within normal limits  COMPREHENSIVE METABOLIC PANEL - Abnormal; Notable for the following:    Glucose, Bld 119 (*)    Alkaline Phosphatase 136 (*)    All other components within normal limits  LIPASE, BLOOD  TROPONIN I    EKG  EKG Interpretation  Date/Time:  Wednesday Apr 27 2017 09:44:12 EDT Ventricular Rate:  92 PR Interval:    QRS Duration: 83 QT Interval:  342 QTC Calculation: 423 R Axis:   78 Text Interpretation:  Sinus rhythm LAE, consider biatrial enlargement Nonspecific T  abnormalities, lateral leads No STEMI.  Confirmed by Nanda Quinton [id number]) on 04/28/2017 11:58:34 AM       Radiology Ct Head Wo Contrast  Result Date: 04/27/2017 CLINICAL DATA:  56 year old female with right side numbness. Distal left ACA territory infarct last month. EXAM: CT HEAD WITHOUT CONTRAST TECHNIQUE: Contiguous axial images were obtained from the base of the skull  through the vertex without intravenous contrast. COMPARISON:  Brain MRI and head CT 03/29/2017. FINDINGS: Brain: Patchy hypodensity in the distal left ACA territory including the posterior left corpus callosum and cingulate gyrus corresponding to the patchy acute infarcts seen last month by MRI. No associated hemorrhage or mass effect. Stable underlying cerebral volume. Gray-white matter differentiation elsewhere remains stable. No midline shift, mass effect, or evidence of intracranial mass lesion. No ventriculomegaly. Vascular: Calcified atherosclerosis at the skull base. No suspicious intracranial vascular hyperdensity. Skull: Stable and negative. No acute osseous abnormality identified. Sinuses/Orbits: Visualized paranasal sinuses and mastoids are stable and well pneumatized. Other: Visualized orbits and scalp soft tissues are within normal limits. IMPRESSION: 1. Expected evolution of the distal left ACA territory infarcts seen in April. 2. No new intracranial abnormality identified. Electronically Signed   By: Genevie Ann M.D.   On: 04/27/2017 12:10    Procedures Procedures (including critical care time)  Medications Ordered in ED Medications  ondansetron (ZOFRAN) injection 4 mg (4 mg Intravenous Given 04/27/17 0953)  morphine 4 MG/ML injection 4 mg (4 mg Intravenous Given 04/27/17 0953)  sodium chloride 0.9 % bolus 1,000 mL (0 mLs Intravenous Stopped 04/27/17 1100)  gi cocktail (Maalox,Lidocaine,Donnatal) (30 mLs Oral Given 04/27/17 1252)  sucralfate (CARAFATE) 1 GM/10ML suspension 1 g (1 g Oral Given 04/27/17 1439)     Initial  Impression / Assessment and Plan / ED Course  I have reviewed the triage vital signs and the nursing notes.  Pertinent labs & imaging results that were available during my care of the patient were reviewed by me and considered in my medical decision making (see chart for details).    CT head without acute changes. Discussed with neurology. Symptoms likely due to evolution of previous stroke. Recommend outpatient follow-up.  Patient's abdominal pain is improved. Patient advised to avoid all NSAIDs. No further vomiting in the emergency department. Final Clinical Impressions(s) / ED Diagnoses   Final diagnoses:  Paresthesia  Chronic gastritis without bleeding, unspecified gastritis type    New Prescriptions Discharge Medication List as of 04/27/2017  2:22 PM    START taking these medications   Details  famotidine (PEPCID) 20 MG tablet Take 1 tablet (20 mg total) by mouth 2 (two) times daily., Starting Wed 04/27/2017, Print    gabapentin (NEURONTIN) 100 MG capsule Take 1 capsule (100 mg total) by mouth 3 (three) times daily., Starting Wed 04/27/2017, Print       I personally performed the services described in this documentation, which was scribed in my presence. The recorded information has been reviewed and is accurate.      Julianne Rice, MD 04/28/17 [phone]

## 2017-04-27 NOTE — Discharge Instructions (Signed)
Avoid all NSAIDs including ibuprofen. Make an appointment to follow-up with gastroenterologist and the neurologist.

## 2017-04-27 NOTE — ED Triage Notes (Addendum)
Right sided numbness that started yesterday evening.  Headache,  Dizzy, nausea and vomiting since yesterday.  c/o worsening abdominal pain.  Recently had CVA that affected right side.

## 2017-05-24 ENCOUNTER — Encounter (HOSPITAL_COMMUNITY): Payer: Self-pay

## 2017-05-24 ENCOUNTER — Encounter (HOSPITAL_COMMUNITY): Payer: Self-pay | Admitting: Nurse Practitioner

## 2017-05-24 ENCOUNTER — Emergency Department (HOSPITAL_COMMUNITY)
Admission: EM | Admit: 2017-05-24 | Discharge: 2017-05-24 | Disposition: A | Discharge status: Home / Self Care | Payer: Medicare Other | Attending: Emergency Medicine | Admitting: Emergency Medicine

## 2017-05-24 ENCOUNTER — Emergency Department (HOSPITAL_COMMUNITY): Payer: Medicare Other

## 2017-05-24 ENCOUNTER — Emergency Department (HOSPITAL_COMMUNITY)
Admission: EM | Admit: 2017-05-24 | Discharge: 2017-05-24 | Discharge status: Against Medical Advice | Payer: Medicare Other | Source: Home / Self Care | Attending: Emergency Medicine | Admitting: Emergency Medicine

## 2017-05-24 DIAGNOSIS — G9389 Other specified disorders of brain: Secondary | ICD-10-CM | POA: Diagnosis not present

## 2017-05-24 DIAGNOSIS — R200 Anesthesia of skin: Secondary | ICD-10-CM

## 2017-05-24 DIAGNOSIS — R1111 Vomiting without nausea: Secondary | ICD-10-CM | POA: Diagnosis not present

## 2017-05-24 DIAGNOSIS — E039 Hypothyroidism, unspecified: Secondary | ICD-10-CM | POA: Insufficient documentation

## 2017-05-24 DIAGNOSIS — Z79899 Other long term (current) drug therapy: Secondary | ICD-10-CM | POA: Insufficient documentation

## 2017-05-24 DIAGNOSIS — Z7982 Long term (current) use of aspirin: Secondary | ICD-10-CM | POA: Insufficient documentation

## 2017-05-24 DIAGNOSIS — F17210 Nicotine dependence, cigarettes, uncomplicated: Secondary | ICD-10-CM | POA: Insufficient documentation

## 2017-05-24 DIAGNOSIS — Z8673 Personal history of transient ischemic attack (TIA), and cerebral infarction without residual deficits: Secondary | ICD-10-CM | POA: Insufficient documentation

## 2017-05-24 DIAGNOSIS — I10 Essential (primary) hypertension: Secondary | ICD-10-CM | POA: Insufficient documentation

## 2017-05-24 DIAGNOSIS — I63522 Cerebral infarction due to unspecified occlusion or stenosis of left anterior cerebral artery: Secondary | ICD-10-CM | POA: Insufficient documentation

## 2017-05-24 DIAGNOSIS — R531 Weakness: Secondary | ICD-10-CM | POA: Diagnosis not present

## 2017-05-24 DIAGNOSIS — R4182 Altered mental status, unspecified: Secondary | ICD-10-CM | POA: Diagnosis not present

## 2017-05-24 DIAGNOSIS — R404 Transient alteration of awareness: Secondary | ICD-10-CM | POA: Diagnosis not present

## 2017-05-24 DIAGNOSIS — R202 Paresthesia of skin: Secondary | ICD-10-CM | POA: Diagnosis not present

## 2017-05-24 LAB — CBC WITH DIFFERENTIAL/PLATELET
Basophils Absolute: 0 10*3/uL (ref 0.0–0.1)
Basophils Relative: 0 %
Eosinophils Absolute: 0.1 10*3/uL (ref 0.0–0.7)
Eosinophils Relative: 1 %
HEMATOCRIT: 40.3 % (ref 36.0–46.0)
Hemoglobin: 13.7 g/dL (ref 12.0–15.0)
LYMPHS ABS: 1.9 10*3/uL (ref 0.7–4.0)
LYMPHS PCT: 35 %
MCH: 36.6 pg — ABNORMAL HIGH (ref 26.0–34.0)
MCHC: 34 g/dL (ref 30.0–36.0)
MCV: 107.8 fL — AB (ref 78.0–100.0)
MONO ABS: 0.2 10*3/uL (ref 0.1–1.0)
MONOS PCT: 4 %
Neutro Abs: 3.3 10*3/uL (ref 1.7–7.7)
Neutrophils Relative %: 60 %
Platelets: 129 10*3/uL — ABNORMAL LOW (ref 150–400)
RBC: 3.74 MIL/uL — ABNORMAL LOW (ref 3.87–5.11)
RDW: 13.2 % (ref 11.5–15.5)
WBC: 5.6 10*3/uL (ref 4.0–10.5)

## 2017-05-24 LAB — URINALYSIS, ROUTINE W REFLEX MICROSCOPIC
Bacteria, UA: NONE SEEN
Bilirubin Urine: NEGATIVE
GLUCOSE, UA: NEGATIVE mg/dL
KETONES UR: NEGATIVE mg/dL
LEUKOCYTES UA: NEGATIVE
Nitrite: NEGATIVE
Protein, ur: NEGATIVE mg/dL
SPECIFIC GRAVITY, URINE: 1.003 — AB (ref 1.005–1.030)
SQUAMOUS EPITHELIAL / LPF: NONE SEEN
pH: 8 (ref 5.0–8.0)

## 2017-05-24 LAB — COMPREHENSIVE METABOLIC PANEL
ALT: 43 U/L (ref 14–54)
ANION GAP: 9 (ref 5–15)
AST: 45 U/L — ABNORMAL HIGH (ref 15–41)
Albumin: 3.3 g/dL — ABNORMAL LOW (ref 3.5–5.0)
Alkaline Phosphatase: 130 U/L — ABNORMAL HIGH (ref 38–126)
BUN: 6 mg/dL (ref 6–20)
CALCIUM: 9.3 mg/dL (ref 8.9–10.3)
CO2: 28 mmol/L (ref 22–32)
Chloride: 102 mmol/L (ref 101–111)
Creatinine, Ser: 0.59 mg/dL (ref 0.44–1.00)
GFR calc Af Amer: >60 mL/min (ref >60)
Glucose, Bld: 111 mg/dL — ABNORMAL HIGH (ref 65–99)
POTASSIUM: 3.4 mmol/L — AB (ref 3.5–5.1)
Sodium: 139 mmol/L (ref 135–145)
TOTAL PROTEIN: 6.8 g/dL (ref 6.5–8.1)
Total Bilirubin: 0.9 mg/dL (ref 0.3–1.2)

## 2017-05-24 LAB — LIPASE, BLOOD: LIPASE: 14 U/L (ref 11–51)

## 2017-05-24 MED ORDER — OXYCODONE-ACETAMINOPHEN 5-325 MG PO TABS
1.0000 | ORAL_TABLET | Freq: Once | ORAL | Status: AC
  Administered 2017-05-24: 1 via ORAL
  Filled 2017-05-24: qty 1

## 2017-05-24 MED ORDER — SODIUM CHLORIDE 0.9 % IV BOLUS (SEPSIS)
500.0000 mL | Freq: Once | INTRAVENOUS | Status: AC
  Administered 2017-05-24: 500 mL via INTRAVENOUS

## 2017-05-24 MED ORDER — ONDANSETRON HCL 4 MG/2ML IJ SOLN
4.0000 mg | Freq: Once | INTRAMUSCULAR | Status: AC
  Administered 2017-05-24: 4 mg via INTRAVENOUS
  Filled 2017-05-24: qty 2

## 2017-05-24 MED ORDER — HYDROMORPHONE HCL 1 MG/ML IJ SOLN
0.5000 mg | Freq: Once | INTRAMUSCULAR | Status: AC
  Administered 2017-05-24: 0.5 mg via INTRAVENOUS
  Filled 2017-05-24: qty 1

## 2017-05-24 NOTE — Discharge Instructions (Signed)
Follow up with Dr. Merlene Laughter.   We will make an appointment for you

## 2017-05-24 NOTE — ED Notes (Signed)
Pt has hx of stroke that affected her right side and does not ambulate.

## 2017-05-24 NOTE — ED Notes (Addendum)
Patient is a poor historian. States she's had multiple strokes in the past. Was at [facility] and here for new onset left sided numbness, tingling that started yesterday. Had CT but did not have MRI today. LSW was 8am yesterday. Hx of right sided weakness and decreased sensation from prior strokes. Taking aspirin but vomiting so hasn't taken in 2 days. C.o abd pain with vomiting that started 2 months ago. Upon assessment today, generalized weakness noted and decreased sensation to left side that started yesterday. Patient forgetful. C/o of h/a. Hx of migraines.

## 2017-05-24 NOTE — ED Provider Notes (Signed)
[facility] DEPT Provider Note   CSN: [id number] Arrival date & time: 05/24/17  7793     History   Chief Complaint Chief Complaint  Patient presents with  . Numbness    HPI Deborah Russo is a 56 y.o. female.  Patient complains of numbness in her right and left hand along with some numbness in her feet. She has a history of a stroke that started out with some numbness couple months ago.   The history is provided by the patient.  Cerebrovascular Accident  This is a recurrent problem. The current episode started 12 to 24 hours ago. The problem occurs constantly. The problem has not changed since onset.Pertinent negatives include no chest pain, no abdominal pain and no headaches. Nothing aggravates the symptoms. Nothing relieves the symptoms. She has tried nothing for the symptoms.    Past Medical History:  Diagnosis Date  . Chronic abdominal pain    since approximately 1991  . Chronic migraine   . Chronic pain   . Hypothyroidism   . Nausea, vomiting, and diarrhea    recurrent, chronic  . Pain management   . Sleep apnea    has CPAP, but doesn't wear it bc she says "i can't sleep with it on"  . Stroke [facility])     Patient Active Problem List   Diagnosis Date Noted  . Acute CVA (cerebrovascular accident) ([place]) 03/29/2017  . Tobacco abuse 03/29/2017  . HTN (hypertension) 03/29/2017  . Aortic atherosclerosis ([place]) 09/29/2016  . Chronic migraine 09/29/2016  . H/O Clostridium difficile infection 07/13/2014  . Diarrhea 07/13/2014  . FTT (failure to thrive) in adult 07/13/2014  . Abdominal pain, unspecified site 07/13/2014  . Chronic abdominal pain   . Abdominal pain 06/19/2014  . Hypothyroidism 06/19/2014  . Hypokalemia 06/19/2014  . Chronic pain 06/19/2014  . Anemia 06/19/2014  . Abscess of axilla, left 03/28/2013    Past Surgical History:  Procedure Laterality Date  . ABDOMINAL HYSTERECTOMY    . ABDOMINAL SURGERY    . BIOPSY N/A 07/17/2014   Procedure:  BIOPSY;  Surgeon: Rogene Houston, MD;  Location: [facility] ORS;  Service: Endoscopy;  Laterality: N/A;  . BREAST SURGERY Left    removed nipple  . CESAREAN SECTION    . CHOLECYSTECTOMY    . ESOPHAGOGASTRODUODENOSCOPY N/A 07/17/2014   Procedure: ESOPHAGOGASTRODUODENOSCOPY (EGD);  Surgeon: Rogene Houston, MD;  Location: [facility] ORS;  Service: Endoscopy;  Laterality: N/A;    OB History    Gravida Para Term Preterm AB Living   2 1 1   1      SAB TAB Ectopic Multiple Live Births     1             Home Medications    Prior to Admission medications   Medication Sig Start Date End Date Taking? Authorizing Provider  aspirin 325 MG tablet Take 1 tablet (325 mg total) by mouth daily. 04/02/17  Yes Orvan Falconer, MD  baclofen (LIORESAL) 10 MG tablet Take 10 mg by mouth daily. 04/09/17  Yes [provider]  diazepam (VALIUM) 5 MG tablet Take 5 mg by mouth 3 (three) times daily.   Yes [provider]  famotidine (PEPCID) 20 MG tablet Take 1 tablet (20 mg total) by mouth 2 (two) times daily. 04/27/17  Yes Julianne Rice, MD  gabapentin (NEURONTIN) 100 MG capsule Take 1 capsule (100 mg total) by mouth 3 (three) times daily. 04/27/17  Yes Julianne Rice, MD  levothyroxine (SYNTHROID, LEVOTHROID) 200 MCG  tablet Take 1 tablet (200 mcg total) by mouth daily before breakfast. 04/02/17  Yes Orvan Falconer, MD  lisinopril (PRINIVIL,ZESTRIL) 10 MG tablet TAKE 1 TABLET DAILY 03/14/17  Yes Eustaquio Maize, MD  omeprazole (PRILOSEC) 20 MG capsule TAKE 1 TABLET DAILY 02/08/17  Yes Eustaquio Maize, MD  ondansetron (ZOFRAN ODT) 4 MG disintegrating tablet Take 1 tablet (4 mg total) by mouth every 8 (eight) hours as needed. 04/03/17  Yes Fredia Sorrow, MD  ondansetron (ZOFRAN) 4 MG tablet Take 1 tablet (4 mg total) by mouth every 6 (six) hours as needed for nausea or vomiting. 04/27/17  Yes Julianne Rice, MD  oxyCODONE (OXYCONTIN) 80 mg 12 hr tablet Take 1 tablet (80 mg total) by mouth every 12 (twelve) hours. 04/01/17   Yes Orvan Falconer, MD  oxyCODONE-acetaminophen (PERCOCET) 10-325 MG tablet Take 1 tablet by mouth 2 (two) times daily.   Yes [provider]  pravastatin (PRAVACHOL) 20 MG tablet TAKE 1 TABLET DAILY 03/14/17  Yes Eustaquio Maize, MD  senna-docusate (SENOKOT-S) 8.6-50 MG tablet Take 1 tablet by mouth at bedtime as needed for mild constipation. 04/01/17  Yes Orvan Falconer, MD  sucralfate (CARAFATE) 1 g tablet TAKE  (1)  TABLET  THREE TIMES DAILY AS NEEDED. Patient taking differently: TAKE  (1)  TABLET  THREE TIMES DAILY AS NEEDED FOR STOMACH PAIN 01/08/17  Yes Eustaquio Maize, MD  SUMAtriptan (IMITREX) 100 MG tablet Take 100 mg by mouth daily as needed for migraine. 04/09/17  Yes [provider]  nicotine (NICODERM CQ - DOSED IN MG/24 HOURS) 21 mg/24hr patch Place 1 patch (21 mg total) onto the skin daily. Patient not taking: Reported on 04/27/2017 04/02/17   Orvan Falconer, MD    Family History Family History  Problem Relation Age of Onset  . Asthma Mother   . Cancer Father     Social History Social History  Substance Use Topics  . Smoking status: Current Every Day Smoker    Packs/day: 0.50    Years: 41.00    Types: Cigarettes  . Smokeless tobacco: Never Used  . Alcohol use No     Allergies   Penicillins and Sulfa antibiotics   Review of Systems Review of Systems  Constitutional: Negative for appetite change and fatigue.  HENT: Negative for congestion, ear discharge and sinus pressure.   Eyes: Negative for discharge.  Respiratory: Negative for cough.   Cardiovascular: Negative for chest pain.  Gastrointestinal: Negative for abdominal pain and diarrhea.  Genitourinary: Negative for frequency and hematuria.  Musculoskeletal: Negative for back pain.  Skin: Negative for rash.  Neurological: Negative for seizures and headaches.       Numbness in her feet and hands  Psychiatric/Behavioral: Negative for hallucinations.     Physical Exam Updated Vital Signs BP (!) 146/87    Pulse 97   Temp 97.8 F (36.6 C) (Oral)   Resp 14   Wt 74.8 kg (165 lb)   SpO2 93%   BMI 30.18 kg/m   Physical Exam  Constitutional: She is oriented to person, place, and time. She appears well-developed.  HENT:  Head: Normocephalic.  Eyes: Conjunctivae and EOM are normal. No scleral icterus.  Neck: Neck supple. No thyromegaly present.  Cardiovascular: Normal rate and regular rhythm.  Exam reveals no gallop and no friction rub.   No murmur heard. Pulmonary/Chest: No stridor. She has no wheezes. She has no rales. She exhibits no tenderness.  Abdominal: She exhibits no distension. There is no tenderness. There  is no rebound.  Musculoskeletal: Normal range of motion. She exhibits no edema.  Lymphadenopathy:    She has no cervical adenopathy.  Neurological: She is oriented to person, place, and time. She exhibits normal muscle tone. Coordination normal.  Decreased sensation in her hands and feet  Skin: No rash noted. No erythema.  Psychiatric: She has a normal mood and affect. Her behavior is normal.     ED Treatments / Results  Labs (all labs ordered are listed, but only abnormal results are displayed) Labs Reviewed  CBC WITH DIFFERENTIAL/PLATELET - Abnormal; Notable for the following:       Result Value   RBC 3.74 (*)    MCV 107.8 (*)    MCH 36.6 (*)    Platelets 129 (*)    All other components within normal limits  COMPREHENSIVE METABOLIC PANEL - Abnormal; Notable for the following:    Potassium 3.4 (*)    Glucose, Bld 111 (*)    Albumin 3.3 (*)    AST 45 (*)    Alkaline Phosphatase 130 (*)    All other components within normal limits  URINALYSIS, ROUTINE W REFLEX MICROSCOPIC - Abnormal; Notable for the following:    Color, Urine STRAW (*)    Specific Gravity, Urine 1.003 (*)    Hgb urine dipstick SMALL (*)    All other components within normal limits  LIPASE, BLOOD    EKG  EKG Interpretation None       Radiology Mr Jodene Nam Head Wo Contrast  Result Date:  05/24/2017 CLINICAL DATA:  56 year old female with altered mental status and weakness for 3 weeks. Numbness. Distal left ACA territory infarcts in April this year. EXAM: MRI HEAD WITHOUT CONTRAST MRA HEAD WITHOUT CONTRAST TECHNIQUE: Multiplanar, multiecho pulse sequences of the brain and surrounding structures were obtained without intravenous contrast. Angiographic images of the head were obtained using MRA technique without contrast. COMPARISON:  Head CT without contrast 04/27/2017. Brain MRI and intracranial MRA 03/29/2017. FINDINGS: MRI HEAD FINDINGS Brain: Developing encephalomalacia in the posteromedial left hemisphere, distal left ACA territory corresponding to the site of ischemia on 03/29/2017. Mild hemosiderin. Additionally, there is a small area of cortical encephalomalacia in the inferior left parietal lobe, left ACA/MCA watershed territory which is new from the prior MRI (series 9, image 27). No convincing diffusion restriction today. Pear[place] Cables and white matter signal elsewhere in the brain is stable since April, including a chronic lacunar infarct of the right caudate nucleus. No midline shift, mass effect, evidence of mass lesion, ventriculomegaly, extra-axial collection or acute intracranial hemorrhage. Cervicomedullary junction and pituitary are within normal limits. Vascular: Major intracranial vascular flow voids are stable and appear normal. Skull and upper cervical spine: Negative. Sinuses/Orbits: Stable and negative. Other: Trace left inferior mastoid air cell fluid is stable and appears inconsequential. Mastoids are otherwise clear. Visible internal auditory structures appear normal. Negative scalp soft tissues. MRA HEAD FINDINGS This MRA is of superior technical quality compared to that on 03/29/2017. Antegrade flow in the posterior circulation with patent distal vertebral arteries, the right is dominant. Patent right PICA origin. No distal vertebral stenosis. Patent vertebrobasilar junction and  basilar artery without stenosis. SCA and PCA origins appear patent and within normal limits. Posterior communicating arteries are diminutive or absent. Bilateral PCA branches appear stable and within normal limits. Antegrade flow in both ICA siphons. Negative distal cervical ICAs. No siphon stenosis. Normal ophthalmic artery origins. Patent carotid termini. MCA and ACA origins appear within normal limits. MCA M1 segments, MCA bifurcations,  and visible MCA branches appear within normal limits. Anterior communicating artery is normal. Dominant appearing left ACA A1 segment, versus dominant left ACA in general. Visible bilateral ACA branches are within normal limits, visible through the A2 segment nearly to the pericallosal artery level. IMPRESSION: 1. Mild extension of the distal left ACA territory infarct into the left parietal lobe since the prior MRI on 03/29/2017. However, no superimposed acute ischemia is identified. 2. Developing encephalomalacia related to #1 with mild hemosiderin but no mass effect or acute intracranial hemorrhage. 3. Chronic lacunar infarct in the right caudate. 4. Negative intracranial MRA. Visible left ACA branches are patent. Electronically Signed   By: Genevie Ann M.D.   On: 05/24/2017 10:05   Mr Brain Wo Contrast  Result Date: 05/24/2017 CLINICAL DATA:  56 year old female with altered mental status and weakness for 3 weeks. Numbness. Distal left ACA territory infarcts in April this year. EXAM: MRI HEAD WITHOUT CONTRAST MRA HEAD WITHOUT CONTRAST TECHNIQUE: Multiplanar, multiecho pulse sequences of the brain and surrounding structures were obtained without intravenous contrast. Angiographic images of the head were obtained using MRA technique without contrast. COMPARISON:  Head CT without contrast 04/27/2017. Brain MRI and intracranial MRA 03/29/2017. FINDINGS: MRI HEAD FINDINGS Brain: Developing encephalomalacia in the posteromedial left hemisphere, distal left ACA territory corresponding  to the site of ischemia on 03/29/2017. Mild hemosiderin. Additionally, there is a small area of cortical encephalomalacia in the inferior left parietal lobe, left ACA/MCA watershed territory which is new from the prior MRI (series 9, image 27). No convincing diffusion restriction today. Pear[place] Cables and white matter signal elsewhere in the brain is stable since April, including a chronic lacunar infarct of the right caudate nucleus. No midline shift, mass effect, evidence of mass lesion, ventriculomegaly, extra-axial collection or acute intracranial hemorrhage. Cervicomedullary junction and pituitary are within normal limits. Vascular: Major intracranial vascular flow voids are stable and appear normal. Skull and upper cervical spine: Negative. Sinuses/Orbits: Stable and negative. Other: Trace left inferior mastoid air cell fluid is stable and appears inconsequential. Mastoids are otherwise clear. Visible internal auditory structures appear normal. Negative scalp soft tissues. MRA HEAD FINDINGS This MRA is of superior technical quality compared to that on 03/29/2017. Antegrade flow in the posterior circulation with patent distal vertebral arteries, the right is dominant. Patent right PICA origin. No distal vertebral stenosis. Patent vertebrobasilar junction and basilar artery without stenosis. SCA and PCA origins appear patent and within normal limits. Posterior communicating arteries are diminutive or absent. Bilateral PCA branches appear stable and within normal limits. Antegrade flow in both ICA siphons. Negative distal cervical ICAs. No siphon stenosis. Normal ophthalmic artery origins. Patent carotid termini. MCA and ACA origins appear within normal limits. MCA M1 segments, MCA bifurcations, and visible MCA branches appear within normal limits. Anterior communicating artery is normal. Dominant appearing left ACA A1 segment, versus dominant left ACA in general. Visible bilateral ACA branches are within normal limits,  visible through the A2 segment nearly to the pericallosal artery level. IMPRESSION: 1. Mild extension of the distal left ACA territory infarct into the left parietal lobe since the prior MRI on 03/29/2017. However, no superimposed acute ischemia is identified. 2. Developing encephalomalacia related to #1 with mild hemosiderin but no mass effect or acute intracranial hemorrhage. 3. Chronic lacunar infarct in the right caudate. 4. Negative intracranial MRA. Visible left ACA branches are patent. Electronically Signed   By: Genevie Ann M.D.   On: 05/24/2017 10:05    Procedures Procedures (including critical care time)  Medications Ordered in ED Medications  oxyCODONE-acetaminophen (PERCOCET/ROXICET) 5-325 MG per tablet 1 tablet (not administered)  sodium chloride 0.9 % bolus 500 mL (0 mLs Intravenous Stopped 05/24/17 0927)  HYDROmorphone (DILAUDID) injection 0.5 mg (0.5 mg Intravenous Given 05/24/17 0940)  ondansetron (ZOFRAN) injection 4 mg (4 mg Intravenous Given 05/24/17 0946)     Initial Impression / Assessment and Plan / ED Course  I have reviewed the triage vital signs and the nursing notes.  Pertinent labs & imaging results that were available during my care of the patient were reviewed by me and considered in my medical decision making (see chart for details).     MRI of the brain did not show any acute changes suggesting new stroke. Although it does show the old stroke with some progression of that stroke but has not acute. Patient presently taking aspirin and was supposed to follow-up with neurologist but never did. We are going to make an appointment with the neurologist in the next week. And she will make sure she is at that appointment  Final Clinical Impressions(s) / ED Diagnoses   Final diagnoses:  Paresthesia    New Prescriptions New Prescriptions   No medications on file     Milton Ferguson, MD 05/24/17 1126

## 2017-05-24 NOTE — ED Triage Notes (Addendum)
Pt brought in by EMS due to numbness to bilateral upper extremities. Numbness began in right arm last night apprx 930 last night and during night has progressed to left arm. Equal grip strength. CBG 127

## 2017-05-24 NOTE — ED Notes (Signed)
Pt has decided to leave AMA and states that she is being forced to leave and no one is doing anything for her.

## 2017-05-24 NOTE — ED Triage Notes (Addendum)
Pt presents with c/o weakness and confusion. Her symptoms began several days ago but became worse yesterday. She reports fatigue, confusion, numbness in the entire left side of her body, nausea, vomiting, urinary frequency. Her friend reports the patient has been unable to complete her normal daily activities and has been forgetting normal details. She was at [facility] this morning for these complaints and was discharged home after all her tests results were normal. She returned to [facility] due to not feeling any better requesting further evaluation

## 2017-05-24 NOTE — Discharge Instructions (Signed)
Follow-up with your PCP and your neurologist.

## 2017-05-24 NOTE — ED Notes (Signed)
Pt states that she is in pain in her abdomen due to chronic issues.  Will notify EDP

## 2017-05-24 NOTE — ED Provider Notes (Signed)
[facility] DEPT Provider Note   CSN: [id number] Arrival date & time: 05/24/17  1543  By signing my name below, I, Evelene Croon, attest that this documentation has been prepared under the direction and in the presence of Virgel Manifold, MD . Electronically Signed: Evelene Croon, Scribe. 05/24/2017. 8:21 PM.  History   Chief Complaint Chief Complaint  Patient presents with  . Weakness    The history is provided by the patient. No language interpreter was used.    HPI Comments:  Deborah Russo is a 56 y.o. female with a history of CVA in 03/29/2017 with residual right sided deficits, who presents to the Emergency Department complaining of left sided numbness that has been waxing and waning but constant since onset last night ~2130. She notes numbness largely to the left hand this AM but now she has numbness to the entire left side. Pt reports associated HA, nausea, vomiting, confusion, and weakness in the left hand. She also notes decreased urination despite having the urge to urinate. She has not urinated since being catheterized at [facility] earlier today. Pt was discharged from [facility] today where she was seen for the same due to negative MRI. She was discharged with instructions to follow up with Neurologist; appointment was made for next week prior to pt's discharge.    Past Medical History:  Diagnosis Date  . Chronic abdominal pain    since approximately 1991  . Chronic migraine   . Chronic pain   . Hypothyroidism   . Nausea, vomiting, and diarrhea    recurrent, chronic  . Pain management   . Sleep apnea    has CPAP, but doesn't wear it bc she says "i can't sleep with it on"  . Stroke [facility])     Patient Active Problem List   Diagnosis Date Noted  . Acute CVA (cerebrovascular accident) ([place]) 03/29/2017  . Tobacco abuse 03/29/2017  . HTN (hypertension) 03/29/2017  . Aortic atherosclerosis ([place]) 09/29/2016  . Chronic migraine 09/29/2016  . H/O Clostridium difficile infection  07/13/2014  . Diarrhea 07/13/2014  . FTT (failure to thrive) in adult 07/13/2014  . Abdominal pain, unspecified site 07/13/2014  . Chronic abdominal pain   . Abdominal pain 06/19/2014  . Hypothyroidism 06/19/2014  . Hypokalemia 06/19/2014  . Chronic pain 06/19/2014  . Anemia 06/19/2014  . Abscess of axilla, left 03/28/2013    Past Surgical History:  Procedure Laterality Date  . ABDOMINAL HYSTERECTOMY    . ABDOMINAL SURGERY    . BIOPSY N/A 07/17/2014   Procedure: BIOPSY;  Surgeon: Rogene Houston, MD;  Location: [facility] ORS;  Service: Endoscopy;  Laterality: N/A;  . BREAST SURGERY Left    removed nipple  . CESAREAN SECTION    . CHOLECYSTECTOMY    . ESOPHAGOGASTRODUODENOSCOPY N/A 07/17/2014   Procedure: ESOPHAGOGASTRODUODENOSCOPY (EGD);  Surgeon: Rogene Houston, MD;  Location: [facility] ORS;  Service: Endoscopy;  Laterality: N/A;    OB History    Gravida Para Term Preterm AB Living   2 1 1   1      SAB TAB Ectopic Multiple Live Births     1             Home Medications    Prior to Admission medications   Medication Sig Start Date End Date Taking? Authorizing Provider  aspirin 325 MG tablet Take 1 tablet (325 mg total) by mouth daily. 04/02/17   Orvan Falconer, MD  baclofen (LIORESAL) 10 MG tablet Take 10 mg by mouth daily. 04/09/17  [provider]  diazepam (VALIUM) 5 MG tablet Take 5 mg by mouth 3 (three) times daily.    [provider]  famotidine (PEPCID) 20 MG tablet Take 1 tablet (20 mg total) by mouth 2 (two) times daily. 04/27/17   Julianne Rice, MD  gabapentin (NEURONTIN) 100 MG capsule Take 1 capsule (100 mg total) by mouth 3 (three) times daily. 04/27/17   Julianne Rice, MD  levothyroxine (SYNTHROID, LEVOTHROID) 200 MCG tablet Take 1 tablet (200 mcg total) by mouth daily before breakfast. 04/02/17   Orvan Falconer, MD  lisinopril (PRINIVIL,ZESTRIL) 10 MG tablet TAKE 1 TABLET DAILY 03/14/17   Eustaquio Maize, MD  nicotine (NICODERM CQ - DOSED IN MG/24 HOURS) 21  mg/24hr patch Place 1 patch (21 mg total) onto the skin daily. Patient not taking: Reported on 04/27/2017 04/02/17   Orvan Falconer, MD  omeprazole (PRILOSEC) 20 MG capsule TAKE 1 TABLET DAILY 02/08/17   Eustaquio Maize, MD  ondansetron (ZOFRAN ODT) 4 MG disintegrating tablet Take 1 tablet (4 mg total) by mouth every 8 (eight) hours as needed. 04/03/17   Fredia Sorrow, MD  ondansetron (ZOFRAN) 4 MG tablet Take 1 tablet (4 mg total) by mouth every 6 (six) hours as needed for nausea or vomiting. 04/27/17   Julianne Rice, MD  oxyCODONE (OXYCONTIN) 80 mg 12 hr tablet Take 1 tablet (80 mg total) by mouth every 12 (twelve) hours. 04/01/17   Orvan Falconer, MD  oxyCODONE-acetaminophen (PERCOCET) 10-325 MG tablet Take 1 tablet by mouth 2 (two) times daily.    [provider]  pravastatin (PRAVACHOL) 20 MG tablet TAKE 1 TABLET DAILY 03/14/17   Eustaquio Maize, MD  senna-docusate (SENOKOT-S) 8.6-50 MG tablet Take 1 tablet by mouth at bedtime as needed for mild constipation. 04/01/17   Orvan Falconer, MD  sucralfate (CARAFATE) 1 g tablet TAKE  (1)  TABLET  THREE TIMES DAILY AS NEEDED. Patient taking differently: TAKE  (1)  TABLET  THREE TIMES DAILY AS NEEDED FOR STOMACH PAIN 01/08/17   Eustaquio Maize, MD  SUMAtriptan (IMITREX) 100 MG tablet Take 100 mg by mouth daily as needed for migraine. 04/09/17   [provider]    Family History Family History  Problem Relation Age of Onset  . Asthma Mother   . Cancer Father     Social History Social History  Substance Use Topics  . Smoking status: Current Every Day Smoker    Packs/day: 0.50    Years: 41.00    Types: Cigarettes  . Smokeless tobacco: Never Used  . Alcohol use No     Allergies   Penicillins and Sulfa antibiotics   Review of Systems Review of Systems  Gastrointestinal: Positive for nausea and vomiting.  Genitourinary: Positive for decreased urine volume and difficulty urinating.  Neurological: Positive for dizziness, weakness and  numbness.  Psychiatric/Behavioral: Positive for confusion.  All other systems reviewed and are negative.    Physical Exam Updated Vital Signs BP (!) 181/98   Pulse 90   Temp 98.4 F (36.9 C) (Oral)   Resp 15   SpO2 95%   Physical Exam  Constitutional: She is oriented to person, place, and time. She appears well-developed and well-nourished. No distress.  HENT:  Head: Normocephalic and atraumatic.  Eyes: Conjunctivae are normal.  Cardiovascular: Normal rate.   Pulmonary/Chest: Effort normal.  Abdominal: Soft. She exhibits no distension. There is no tenderness.  Neurological: She is alert and oriented to person, place, and time.  Poor effort on  exam weak in BUE: 4/5 on right and  3/5 on left 4/5 strength to RLE and 3/5 LLE  Reports decreased sensation to entire left side LUE/LLE and face  Skin: Skin is warm and dry.  Nursing note and vitals reviewed.   ED Treatments / Results  DIAGNOSTIC STUDIES:  Oxygen Saturation is 95% on RA, normal by my interpretation.    COORDINATION OF CARE:  8:19 PM Discussed treatment plan with pt at bedside and pt agreed to plan.  Labs (all labs ordered are listed, but only abnormal results are displayed) Labs Reviewed - No data to display  EKG  EKG Interpretation None       Radiology Mr Virgel Paling Wo Contrast  Result Date: 05/24/2017 CLINICAL DATA:  56 year old female with altered mental status and weakness for 3 weeks. Numbness. Distal left ACA territory infarcts in April this year. EXAM: MRI HEAD WITHOUT CONTRAST MRA HEAD WITHOUT CONTRAST TECHNIQUE: Multiplanar, multiecho pulse sequences of the brain and surrounding structures were obtained without intravenous contrast. Angiographic images of the head were obtained using MRA technique without contrast. COMPARISON:  Head CT without contrast 04/27/2017. Brain MRI and intracranial MRA 03/29/2017. FINDINGS: MRI HEAD FINDINGS Brain: Developing encephalomalacia in the posteromedial left  hemisphere, distal left ACA territory corresponding to the site of ischemia on 03/29/2017. Mild hemosiderin. Additionally, there is a small area of cortical encephalomalacia in the inferior left parietal lobe, left ACA/MCA watershed territory which is new from the prior MRI (series 9, image 27). No convincing diffusion restriction today. [place] Cables and white matter signal elsewhere in the brain is stable since April, including a chronic lacunar infarct of the right caudate nucleus. No midline shift, mass effect, evidence of mass lesion, ventriculomegaly, extra-axial collection or acute intracranial hemorrhage. Cervicomedullary junction and pituitary are within normal limits. Vascular: Major intracranial vascular flow voids are stable and appear normal. Skull and upper cervical spine: Negative. Sinuses/Orbits: Stable and negative. Other: Trace left inferior mastoid air cell fluid is stable and appears inconsequential. Mastoids are otherwise clear. Visible internal auditory structures appear normal. Negative scalp soft tissues. MRA HEAD FINDINGS This MRA is of superior technical quality compared to that on 03/29/2017. Antegrade flow in the posterior circulation with patent distal vertebral arteries, the right is dominant. Patent right PICA origin. No distal vertebral stenosis. Patent vertebrobasilar junction and basilar artery without stenosis. SCA and PCA origins appear patent and within normal limits. Posterior communicating arteries are diminutive or absent. Bilateral PCA branches appear stable and within normal limits. Antegrade flow in both ICA siphons. Negative distal cervical ICAs. No siphon stenosis. Normal ophthalmic artery origins. Patent carotid termini. MCA and ACA origins appear within normal limits. MCA M1 segments, MCA bifurcations, and visible MCA branches appear within normal limits. Anterior communicating artery is normal. Dominant appearing left ACA A1 segment, versus dominant left ACA in general. Visible  bilateral ACA branches are within normal limits, visible through the A2 segment nearly to the pericallosal artery level. IMPRESSION: 1. Mild extension of the distal left ACA territory infarct into the left parietal lobe since the prior MRI on 03/29/2017. However, no superimposed acute ischemia is identified. 2. Developing encephalomalacia related to #1 with mild hemosiderin but no mass effect or acute intracranial hemorrhage. 3. Chronic lacunar infarct in the right caudate. 4. Negative intracranial MRA. Visible left ACA branches are patent. Electronically Signed   By: Genevie Ann M.D.   On: 05/24/2017 10:05   Mr Brain Wo Contrast  Result Date: 05/24/2017 CLINICAL DATA:  56 year old female  with altered mental status and weakness for 3 weeks. Numbness. Distal left ACA territory infarcts in April this year. EXAM: MRI HEAD WITHOUT CONTRAST MRA HEAD WITHOUT CONTRAST TECHNIQUE: Multiplanar, multiecho pulse sequences of the brain and surrounding structures were obtained without intravenous contrast. Angiographic images of the head were obtained using MRA technique without contrast. COMPARISON:  Head CT without contrast 04/27/2017. Brain MRI and intracranial MRA 03/29/2017. FINDINGS: MRI HEAD FINDINGS Brain: Developing encephalomalacia in the posteromedial left hemisphere, distal left ACA territory corresponding to the site of ischemia on 03/29/2017. Mild hemosiderin. Additionally, there is a small area of cortical encephalomalacia in the inferior left parietal lobe, left ACA/MCA watershed territory which is new from the prior MRI (series 9, image 27). No convincing diffusion restriction today. [place] Cables and white matter signal elsewhere in the brain is stable since April, including a chronic lacunar infarct of the right caudate nucleus. No midline shift, mass effect, evidence of mass lesion, ventriculomegaly, extra-axial collection or acute intracranial hemorrhage. Cervicomedullary junction and pituitary are within normal  limits. Vascular: Major intracranial vascular flow voids are stable and appear normal. Skull and upper cervical spine: Negative. Sinuses/Orbits: Stable and negative. Other: Trace left inferior mastoid air cell fluid is stable and appears inconsequential. Mastoids are otherwise clear. Visible internal auditory structures appear normal. Negative scalp soft tissues. MRA HEAD FINDINGS This MRA is of superior technical quality compared to that on 03/29/2017. Antegrade flow in the posterior circulation with patent distal vertebral arteries, the right is dominant. Patent right PICA origin. No distal vertebral stenosis. Patent vertebrobasilar junction and basilar artery without stenosis. SCA and PCA origins appear patent and within normal limits. Posterior communicating arteries are diminutive or absent. Bilateral PCA branches appear stable and within normal limits. Antegrade flow in both ICA siphons. Negative distal cervical ICAs. No siphon stenosis. Normal ophthalmic artery origins. Patent carotid termini. MCA and ACA origins appear within normal limits. MCA M1 segments, MCA bifurcations, and visible MCA branches appear within normal limits. Anterior communicating artery is normal. Dominant appearing left ACA A1 segment, versus dominant left ACA in general. Visible bilateral ACA branches are within normal limits, visible through the A2 segment nearly to the pericallosal artery level. IMPRESSION: 1. Mild extension of the distal left ACA territory infarct into the left parietal lobe since the prior MRI on 03/29/2017. However, no superimposed acute ischemia is identified. 2. Developing encephalomalacia related to #1 with mild hemosiderin but no mass effect or acute intracranial hemorrhage. 3. Chronic lacunar infarct in the right caudate. 4. Negative intracranial MRA. Visible left ACA branches are patent. Electronically Signed   By: Genevie Ann M.D.   On: 05/24/2017 10:05    Procedures Procedures (including critical care  time)  Medications Ordered in ED Medications - No data to display   Initial Impression / Assessment and Plan / ED Course  I have reviewed the triage vital signs and the nursing notes.  Pertinent labs & imaging results that were available during my care of the patient were reviewed by me and considered in my medical decision making (see chart for details).    Reviewed pt's imagining and labs from earlier ED visit today. Discussed that MRI did not show acute stroke and that further imaging or labs are not indicated. Offered nausea meds and catheterization as pt reported difficulty urinating and  bladder scan showed 400ccs of urine. She declined and states she will go to [facility].   Final Clinical Impressions(s) / ED Diagnoses   Final diagnoses:  Numbness  New Prescriptions New Prescriptions   No medications on file   I personally preformed the services scribed in my presence. The recorded information has been reviewed is accurate. Virgel Manifold, MD.     Virgel Manifold, MD 05/30/17 [id number]

## 2017-05-24 NOTE — ED Notes (Signed)
Pt states she is unable to urinate. 466m in bladder when scanned with bladder scanner.

## 2017-06-16 ENCOUNTER — Emergency Department (HOSPITAL_COMMUNITY): Payer: Medicare Other

## 2017-06-16 ENCOUNTER — Observation Stay (HOSPITAL_COMMUNITY)
Admission: EM | Admit: 2017-06-16 | Discharge: 2017-06-17 | Disposition: A | Discharge status: Home / Self Care | Payer: Medicare Other | Attending: Internal Medicine | Admitting: Internal Medicine

## 2017-06-16 ENCOUNTER — Encounter (HOSPITAL_COMMUNITY): Payer: Self-pay | Admitting: *Deleted

## 2017-06-16 DIAGNOSIS — I10 Essential (primary) hypertension: Secondary | ICD-10-CM | POA: Diagnosis present

## 2017-06-16 DIAGNOSIS — R9431 Abnormal electrocardiogram [ECG] [EKG]: Secondary | ICD-10-CM | POA: Diagnosis not present

## 2017-06-16 DIAGNOSIS — Z7189 Other specified counseling: Secondary | ICD-10-CM

## 2017-06-16 DIAGNOSIS — Z515 Encounter for palliative care: Secondary | ICD-10-CM

## 2017-06-16 DIAGNOSIS — R4020 Unspecified coma: Principal | ICD-10-CM | POA: Insufficient documentation

## 2017-06-16 DIAGNOSIS — Z79899 Other long term (current) drug therapy: Secondary | ICD-10-CM | POA: Diagnosis not present

## 2017-06-16 DIAGNOSIS — R4189 Other symptoms and signs involving cognitive functions and awareness: Secondary | ICD-10-CM

## 2017-06-16 DIAGNOSIS — Z7982 Long term (current) use of aspirin: Secondary | ICD-10-CM | POA: Diagnosis not present

## 2017-06-16 DIAGNOSIS — Z5181 Encounter for therapeutic drug level monitoring: Secondary | ICD-10-CM | POA: Diagnosis not present

## 2017-06-16 DIAGNOSIS — E039 Hypothyroidism, unspecified: Secondary | ICD-10-CM | POA: Diagnosis present

## 2017-06-16 DIAGNOSIS — F17210 Nicotine dependence, cigarettes, uncomplicated: Secondary | ICD-10-CM | POA: Insufficient documentation

## 2017-06-16 DIAGNOSIS — R4182 Altered mental status, unspecified: Secondary | ICD-10-CM | POA: Diagnosis present

## 2017-06-16 DIAGNOSIS — T50901A Poisoning by unspecified drugs, medicaments and biological substances, accidental (unintentional), initial encounter: Secondary | ICD-10-CM | POA: Diagnosis not present

## 2017-06-16 LAB — BLOOD GAS, ARTERIAL
ACID-BASE DEFICIT: 0.4 mmol/L (ref 0.0–2.0)
BICARBONATE: 24.7 mmol/L (ref 20.0–28.0)
DRAWN BY: 234301
FIO2: 21
O2 SAT: 99.1 %
Patient temperature: 37
pCO2 arterial: 33.4 mmHg (ref 32.0–48.0)
pH, Arterial: 7.454 — ABNORMAL HIGH (ref 7.350–7.450)
pO2, Arterial: 151 mmHg — ABNORMAL HIGH (ref 83.0–108.0)

## 2017-06-16 LAB — URINALYSIS, ROUTINE W REFLEX MICROSCOPIC
BACTERIA UA: NONE SEEN
Bilirubin Urine: NEGATIVE
Glucose, UA: NEGATIVE mg/dL
Ketones, ur: NEGATIVE mg/dL
Leukocytes, UA: NEGATIVE
Nitrite: NEGATIVE
PH: 6 (ref 5.0–8.0)
Protein, ur: NEGATIVE mg/dL
SPECIFIC GRAVITY, URINE: 1.008 (ref 1.005–1.030)

## 2017-06-16 LAB — ETHANOL: Alcohol, Ethyl (B): <5 mg/dL (ref <5)

## 2017-06-16 LAB — CBC
HEMATOCRIT: 45.4 % (ref 36.0–46.0)
Hemoglobin: 15.2 g/dL — ABNORMAL HIGH (ref 12.0–15.0)
MCH: 36.5 pg — ABNORMAL HIGH (ref 26.0–34.0)
MCHC: 33.5 g/dL (ref 30.0–36.0)
MCV: 109.1 fL — AB (ref 78.0–100.0)
PLATELETS: 146 10*3/uL — AB (ref 150–400)
RBC: 4.16 MIL/uL (ref 3.87–5.11)
RDW: 13.7 % (ref 11.5–15.5)
WBC: 7.8 10*3/uL (ref 4.0–10.5)

## 2017-06-16 LAB — RAPID URINE DRUG SCREEN, HOSP PERFORMED
AMPHETAMINES: NOT DETECTED
BENZODIAZEPINES: POSITIVE — AB
Barbiturates: NOT DETECTED
COCAINE: NOT DETECTED
OPIATES: POSITIVE — AB
TETRAHYDROCANNABINOL: POSITIVE — AB

## 2017-06-16 LAB — COMPREHENSIVE METABOLIC PANEL
ALBUMIN: 4.1 g/dL (ref 3.5–5.0)
ALK PHOS: 116 U/L (ref 38–126)
ALT: 14 U/L (ref 14–54)
AST: 19 U/L (ref 15–41)
Anion gap: 9 (ref 5–15)
BILIRUBIN TOTAL: 0.6 mg/dL (ref 0.3–1.2)
BUN: 11 mg/dL (ref 6–20)
CO2: 27 mmol/L (ref 22–32)
CREATININE: 0.89 mg/dL (ref 0.44–1.00)
Calcium: 9.7 mg/dL (ref 8.9–10.3)
Chloride: 105 mmol/L (ref 101–111)
GFR calc Af Amer: >60 mL/min (ref >60)
GLUCOSE: 131 mg/dL — AB (ref 65–99)
POTASSIUM: 3.9 mmol/L (ref 3.5–5.1)
Sodium: 141 mmol/L (ref 135–145)
TOTAL PROTEIN: 7.9 g/dL (ref 6.5–8.1)

## 2017-06-16 LAB — AMMONIA: AMMONIA: 21 umol/L (ref 9–35)

## 2017-06-16 LAB — APTT: aPTT: 29 seconds (ref 24–36)

## 2017-06-16 LAB — CBG MONITORING, ED: Glucose-Capillary: 132 mg/dL — ABNORMAL HIGH (ref 65–99)

## 2017-06-16 LAB — DIFFERENTIAL
BASOS ABS: 0 10*3/uL (ref 0.0–0.1)
Basophils Relative: 0 %
EOS ABS: 0.1 10*3/uL (ref 0.0–0.7)
Eosinophils Relative: 1 %
LYMPHS ABS: 2.3 10*3/uL (ref 0.7–4.0)
Lymphocytes Relative: 30 %
MONOS PCT: 5 %
Monocytes Absolute: 0.4 10*3/uL (ref 0.1–1.0)
Neutro Abs: 5 10*3/uL (ref 1.7–7.7)
Neutrophils Relative %: 64 %

## 2017-06-16 LAB — PROTIME-INR
INR: 0.95
PROTHROMBIN TIME: 12.7 s (ref 11.4–15.2)

## 2017-06-16 LAB — I-STAT TROPONIN, ED: Troponin i, poc: 0.02 ng/mL (ref 0.00–0.08)

## 2017-06-16 LAB — TSH: TSH: 8.128 u[IU]/mL — ABNORMAL HIGH (ref 0.350–4.500)

## 2017-06-16 LAB — T4, FREE: FREE T4: 0.72 ng/dL (ref 0.61–1.12)

## 2017-06-16 MED ORDER — NALOXONE HCL 2 MG/2ML IJ SOSY
1.0000 mg | PREFILLED_SYRINGE | Freq: Once | INTRAMUSCULAR | Status: AC
  Administered 2017-06-16: 1 mg via INTRAVENOUS

## 2017-06-16 MED ORDER — NALOXONE HCL 2 MG/2ML IJ SOSY
PREFILLED_SYRINGE | INTRAMUSCULAR | Status: AC
  Filled 2017-06-16: qty 2

## 2017-06-16 MED ORDER — LORAZEPAM 2 MG/ML IJ SOLN
0.5000 mg | Freq: Once | INTRAMUSCULAR | Status: AC
  Administered 2017-06-16: 0.5 mg via INTRAVENOUS
  Filled 2017-06-16: qty 1

## 2017-06-16 MED ORDER — NALOXONE HCL 2 MG/2ML IJ SOSY
1.0000 mg | PREFILLED_SYRINGE | Freq: Once | INTRAMUSCULAR | Status: AC
  Administered 2017-06-16: 1 mg via INTRAVENOUS
  Filled 2017-06-16: qty 2

## 2017-06-16 MED ORDER — DILTIAZEM HCL 25 MG/5ML IV SOLN: 10.0000 mg | Freq: Once | INTRAVENOUS | Status: DC

## 2017-06-16 NOTE — ED Notes (Signed)
Pt sleeping with snoring respirations, upon waking pt unable to state name, DOB, year, or location.

## 2017-06-16 NOTE — ED Notes (Signed)
MD Y[place] notified of pt's HR increase into 130s, has been sustaining rate.

## 2017-06-16 NOTE — ED Triage Notes (Signed)
Pt comes in with altered mental status starting this morning at 1000. Per sister, this was a sudden onset of mental status change. No unilateral weakness noted. EDP [place] has evaluated patient.

## 2017-06-16 NOTE — ED Notes (Signed)
Pt soiled the bed at this time, hands in feces and pulling wires. MD [facility] notified of behavior. Pt grabbing at staff.

## 2017-06-16 NOTE — ED Notes (Signed)
Pt sleeping at this time with even and unlabored respirations. NAD noted. MD at bedside.

## 2017-06-16 NOTE — ED Notes (Signed)
Pt sleeping with snoring respirations, pt unable to state name, date, location, or year upon waking.

## 2017-06-16 NOTE — ED Notes (Signed)
Pt began thrashing around in bed and pulling at cords, HR in 120s at this time. MD Lita Mains notified of pt's reaction. Seizure pads placed on bed and pt placed in Trendelenburg for safety.

## 2017-06-16 NOTE — ED Provider Notes (Signed)
[facility] DEPT Provider Note   CSN: [id number] Arrival date & time: 06/16/17  1756     History   Chief Complaint Chief Complaint  Patient presents with  . Altered Mental Status    HPI Deborah Russo is a 56 y.o. female.  HPI Patient is brought in by sister for altered mental status. Level V caveat applies. Sister states that the patient was drowsy and confused since 10:00 this morning. Does not believe the patient taken additional medications.States she gives the patient her medicines. Patient has a history of previous stroke earlier this year with right-sided deficits. No known recent trauma.   Past Medical History:  Diagnosis Date  . Chronic abdominal pain    since approximately 1991  . Chronic migraine   . Chronic pain   . Hypothyroidism   . Nausea, vomiting, and diarrhea    recurrent, chronic  . Pain management   . Sleep apnea    has CPAP, but doesn't wear it bc she says "i can't sleep with it on"  . Stroke [facility])     Patient Active Problem List   Diagnosis Date Noted  . Altered mental status 06/17/2017  . Polypharmacy   . Palliative care encounter   . Goals of care, counseling/discussion   . DNR (do not resuscitate) discussion   . Acute CVA (cerebrovascular accident) ([facility] [place] [facility]) 03/29/2017  . Tobacco abuse 03/29/2017  . HTN (hypertension) 03/29/2017  . Aortic atherosclerosis ([place]) 09/29/2016  . Chronic migraine 09/29/2016  . H/O Clostridium difficile infection 07/13/2014  . Diarrhea 07/13/2014  . FTT (failure to thrive) in adult 07/13/2014  . Abdominal pain, unspecified site 07/13/2014  . Chronic abdominal pain   . Abdominal pain 06/19/2014  . Hypothyroidism 06/19/2014  . Hypokalemia 06/19/2014  . Chronic pain 06/19/2014  . Anemia 06/19/2014  . Abscess of axilla, left 03/28/2013    Past Surgical History:  Procedure Laterality Date  . ABDOMINAL HYSTERECTOMY    . ABDOMINAL SURGERY    . BIOPSY N/A 07/17/2014   Procedure: BIOPSY;  Surgeon: Rogene Houston, MD;  Location: [facility] ORS;  Service: Endoscopy;  Laterality: N/A;  . BREAST SURGERY Left    removed nipple  . CESAREAN SECTION    . CHOLECYSTECTOMY    . ESOPHAGOGASTRODUODENOSCOPY N/A 07/17/2014   Procedure: ESOPHAGOGASTRODUODENOSCOPY (EGD);  Surgeon: Rogene Houston, MD;  Location: [facility] ORS;  Service: Endoscopy;  Laterality: N/A;    OB History    Gravida Para Term Preterm AB Living   2 1 1   1      SAB TAB Ectopic Multiple Live Births     1             Home Medications    Prior to Admission medications   Medication Sig Start Date End Date Taking? Authorizing Provider  aspirin 325 MG tablet Take 1 tablet (325 mg total) by mouth daily. 04/02/17  Yes Orvan Falconer, MD  baclofen (LIORESAL) 10 MG tablet Take 10 mg by mouth daily. 04/09/17  Yes [provider]  famotidine (PEPCID) 20 MG tablet Take 1 tablet (20 mg total) by mouth 2 (two) times daily. 04/27/17  Yes Julianne Rice, MD  gabapentin (NEURONTIN) 100 MG capsule Take 1 capsule (100 mg total) by mouth 3 (three) times daily. 04/27/17  Yes Julianne Rice, MD  levothyroxine (SYNTHROID, LEVOTHROID) 200 MCG tablet Take 1 tablet (200 mcg total) by mouth daily before breakfast. 04/02/17  Yes Orvan Falconer, MD  lisinopril (PRINIVIL,ZESTRIL) 10 MG tablet TAKE 1 TABLET  DAILY 03/14/17  Yes Eustaquio Maize, MD  omeprazole (PRILOSEC) 20 MG capsule TAKE 1 TABLET DAILY 02/08/17  Yes Eustaquio Maize, MD  oxyCODONE (OXYCONTIN) 80 mg 12 hr tablet Take 1 tablet (80 mg total) by mouth every 12 (twelve) hours. 04/01/17  Yes Orvan Falconer, MD  oxyCODONE-acetaminophen (PERCOCET) 10-325 MG tablet Take 1 tablet by mouth 2 (two) times daily.   Yes [provider]  pravastatin (PRAVACHOL) 20 MG tablet TAKE 1 TABLET DAILY 03/14/17  Yes Eustaquio Maize, MD  senna-docusate (SENOKOT-S) 8.6-50 MG tablet Take 1 tablet by mouth at bedtime as needed for mild constipation. 04/01/17  Yes Orvan Falconer, MD  sucralfate (CARAFATE) 1 g tablet TAKE  (1)  TABLET  THREE  TIMES DAILY AS NEEDED. Patient taking differently: TAKE  (1)  TABLET  THREE TIMES DAILY AS NEEDED FOR STOMACH PAIN 01/08/17  Yes Eustaquio Maize, MD  SUMAtriptan (IMITREX) 100 MG tablet Take 100 mg by mouth daily as needed for migraine. 04/09/17  Yes [provider]  diazepam (VALIUM) 5 MG tablet Take 0.5 tablets (2.5 mg total) by mouth 3 (three) times daily. 06/17/17   Janece Canterbury, MD    Family History Family History  Problem Relation Age of Onset  . Asthma Mother   . Cancer Father     Social History Social History  Substance Use Topics  . Smoking status: Current Every Day Smoker    Packs/day: 0.50    Years: 41.00    Types: Cigarettes  . Smokeless tobacco: Never Used  . Alcohol use No     Allergies   Penicillins and Sulfa antibiotics   Review of Systems Review of Systems  Unable to perform ROS: Mental status change     Physical Exam Updated Vital Signs BP (!) 153/96 (BP Location: Left Arm)   Pulse 83   Temp 97.8 F (36.6 C) (Oral)   Resp 20   SpO2 100%   Physical Exam  Constitutional: She appears well-developed and well-nourished. No distress.  Patient is drowsy but easily aroused  HENT:  Head: Normocephalic and atraumatic.  Mouth/Throat: Oropharynx is clear and moist.  No obvious head trauma. No intraoral trauma.  Eyes: EOM are normal. Pupils are equal, round, and reactive to light.  Pupils are 3 mm and sluggish.  Neck: Normal range of motion. Neck supple.  No meningismus. No posterior cervical tenderness to palpation.  Cardiovascular: Normal rate and regular rhythm.  Exam reveals no friction rub.   No murmur heard. Pulmonary/Chest: Effort normal and breath sounds normal. She has no wheezes. She has no rales. She exhibits no tenderness.  Abdominal: Soft. Bowel sounds are normal. There is no tenderness. There is no rebound and no guarding.  Musculoskeletal: Normal range of motion. She exhibits no edema or tenderness.  No lower extremity  swelling, asymmetry or tenderness. Distal pulses are 2+.  Lymphadenopathy:    She has no cervical adenopathy.  Neurological:  Patient is drowsy and minimally answering questions. She does follow commands. Cranial nerves II through XII are grossly intact. 5/5 motor in all extremities. Sensation to light touch is grossly intact.  Skin: Skin is warm and dry. Capillary refill takes less than 2 seconds. No rash noted. No erythema.  Nursing note and vitals reviewed.    ED Treatments / Results  Labs (all labs ordered are listed, but only abnormal results are displayed) Labs Reviewed  COMPREHENSIVE METABOLIC PANEL - Abnormal; Notable for the following:       Result Value  Glucose, Bld 131 (*)    All other components within normal limits  CBC - Abnormal; Notable for the following:    Hemoglobin 15.2 (*)    MCV 109.1 (*)    MCH 36.5 (*)    Platelets 146 (*)    All other components within normal limits  RAPID URINE DRUG SCREEN, HOSP PERFORMED - Abnormal; Notable for the following:    Opiates POSITIVE (*)    Benzodiazepines POSITIVE (*)    Tetrahydrocannabinol POSITIVE (*)    All other components within normal limits  URINALYSIS, ROUTINE W REFLEX MICROSCOPIC - Abnormal; Notable for the following:    Hgb urine dipstick SMALL (*)    Squamous Epithelial / LPF 0-5 (*)    All other components within normal limits  TSH - Abnormal; Notable for the following:    TSH 8.128 (*)    All other components within normal limits  BLOOD GAS, ARTERIAL - Abnormal; Notable for the following:    pH, Arterial 7.454 (*)    pO2, Arterial 151 (*)    All other components within normal limits  CBC - Abnormal; Notable for the following:    MCV 107.0 (*)    MCH 35.8 (*)    Platelets 138 (*)    All other components within normal limits  COMPREHENSIVE METABOLIC PANEL - Abnormal; Notable for the following:    Glucose, Bld 113 (*)    All other components within normal limits  GLUCOSE, CAPILLARY - Abnormal;  Notable for the following:    Glucose-Capillary 142 (*)    All other components within normal limits  CBG MONITORING, ED - Abnormal; Notable for the following:    Glucose-Capillary 132 (*)    All other components within normal limits  ETHANOL  PROTIME-INR  APTT  DIFFERENTIAL  T4, FREE  AMMONIA  I-STAT TROPOININ, ED    EKG  EKG Interpretation  Date/Time:  Friday June 17 2017 00:24:44 EDT Ventricular Rate:  103 PR Interval:    QRS Duration: 83 QT Interval:  366 QTC Calculation: 480 R Axis:   81 Text Interpretation:  Sinus tachycardia Left atrial enlargement Nonspecific repol abnormality, diffuse leads Baseline wander in lead(s) V4 V6 now sinus ST depressions laterally Confirmed by Ezequiel Essex [phone]) on 06/17/2017 12:55:03 AM       Radiology Ct Head Wo Contrast  Result Date: 06/16/2017 CLINICAL DATA:  Altered mental status EXAM: CT HEAD WITHOUT CONTRAST TECHNIQUE: Contiguous axial images were obtained from the base of the skull through the vertex without intravenous contrast. COMPARISON:  Brain MRI 05/24/2017 FINDINGS: Brain: No mass lesion, intraparenchymal hemorrhage or extra-axial collection. No evidence of acute cortical infarct. Encephalomalacia of within the posterior left anterior cerebral artery distribution. Old left basal ganglia lacunar infarct. Brain parenchyma is otherwise normal for age. Vascular: No hyperdense vessel or unexpected calcification. Skull: Normal visualized skull base, calvarium and extracranial soft tissues. Sinuses/Orbits: No sinus fluid levels or advanced mucosal thickening. No mastoid effusion. Normal orbits. IMPRESSION: Encephalomalacia at the site of recent (April 2018) left ACA infarct. No acute intracranial abnormality. Electronically Signed   By: Ulyses Jarred M.D.   On: 06/16/2017 20:13    Procedures Procedures (including critical care time)  Medications Ordered in ED Medications  famotidine (PEPCID) tablet 20 mg (not administered)    aspirin tablet 325 mg (325 mg Oral Given 06/17/17 0848)  levothyroxine (SYNTHROID, LEVOTHROID) tablet 200 mcg (200 mcg Oral Given 06/17/17 0848)  lisinopril (PRINIVIL,ZESTRIL) tablet 10 mg (10 mg Oral Given 06/17/17 0848)  pravastatin (PRAVACHOL) tablet 20 mg (20 mg Oral Given 06/17/17 0848)  pantoprazole (PROTONIX) EC tablet 40 mg (40 mg Oral Given 06/17/17 0849)  enoxaparin (LOVENOX) injection 40 mg (40 mg Subcutaneous Given 06/17/17 0849)  ondansetron (ZOFRAN) tablet 4 mg (not administered)    Or  ondansetron (ZOFRAN) injection 4 mg (not administered)  acetaminophen (TYLENOL) tablet 650 mg (650 mg Oral Not Given 06/17/17 0852)    Or  acetaminophen (TYLENOL) suppository 650 mg ( Rectal See Alternative 06/17/17 0852)  oxyCODONE-acetaminophen (PERCOCET/ROXICET) 5-325 MG per tablet 2 tablet (2 tablets Oral Given 06/17/17 1438)  oxyCODONE (OXYCONTIN) 12 hr tablet 60 mg (60 mg Oral Given 06/17/17 1213)  diazepam (VALIUM) tablet 2.5 mg (not administered)  naloxone [facility]) injection 1 mg (1 mg Intravenous Given 06/16/17 1830)  naloxone [facility]) injection 1 mg (1 mg Intravenous Given 06/16/17 2150)  LORazepam (ATIVAN) injection 0.5 mg (0.5 mg Intravenous Given 06/16/17 2355)     Initial Impression / Assessment and Plan / ED Course  I have reviewed the triage vital signs and the nursing notes.  Pertinent labs & imaging results that were available during my care of the patient were reviewed by me and considered in my medical decision making (see chart for details).     Concern for possible drug overdose. Given one Narcan in the emergency department. Patient became more alert. The numbness gradually become unresponsive. Repeat dose of Narcan given. Patient became very agitated heart rate increased into the 120s. EKG with what appears to be atrial flutter with 2:1 block versus sinus tachycardia. Given dose of Ativan and heart rate improved significantly. Diltiazem was never given. Discussed with hospitalist  and we'll see patient in the emergency department and admit.  Final Clinical Impressions(s) / ED Diagnoses   Final diagnoses:  Unresponsiveness  Polypharmacy    New Prescriptions Current Discharge Medication List       Julianne Rice, MD 06/17/17 1547

## 2017-06-17 ENCOUNTER — Encounter (HOSPITAL_COMMUNITY): Payer: Self-pay

## 2017-06-17 ENCOUNTER — Observation Stay (HOSPITAL_COMMUNITY): Payer: Medicare Other

## 2017-06-17 DIAGNOSIS — Z7189 Other specified counseling: Secondary | ICD-10-CM

## 2017-06-17 DIAGNOSIS — I10 Essential (primary) hypertension: Secondary | ICD-10-CM | POA: Diagnosis not present

## 2017-06-17 DIAGNOSIS — Z515 Encounter for palliative care: Secondary | ICD-10-CM

## 2017-06-17 DIAGNOSIS — E039 Hypothyroidism, unspecified: Secondary | ICD-10-CM | POA: Diagnosis not present

## 2017-06-17 DIAGNOSIS — R4189 Other symptoms and signs involving cognitive functions and awareness: Secondary | ICD-10-CM

## 2017-06-17 DIAGNOSIS — Z79899 Other long term (current) drug therapy: Secondary | ICD-10-CM

## 2017-06-17 DIAGNOSIS — R4182 Altered mental status, unspecified: Secondary | ICD-10-CM | POA: Diagnosis present

## 2017-06-17 DIAGNOSIS — R401 Stupor: Secondary | ICD-10-CM

## 2017-06-17 LAB — COMPREHENSIVE METABOLIC PANEL
ALT: 14 U/L (ref 14–54)
AST: 18 U/L (ref 15–41)
Albumin: 3.7 g/dL (ref 3.5–5.0)
Alkaline Phosphatase: 106 U/L (ref 38–126)
Anion gap: 7 (ref 5–15)
BILIRUBIN TOTAL: 1 mg/dL (ref 0.3–1.2)
BUN: 10 mg/dL (ref 6–20)
CHLORIDE: 107 mmol/L (ref 101–111)
CO2: 27 mmol/L (ref 22–32)
CREATININE: 0.72 mg/dL (ref 0.44–1.00)
Calcium: 9.4 mg/dL (ref 8.9–10.3)
Glucose, Bld: 113 mg/dL — ABNORMAL HIGH (ref 65–99)
POTASSIUM: 3.8 mmol/L (ref 3.5–5.1)
Sodium: 141 mmol/L (ref 135–145)
TOTAL PROTEIN: 7.1 g/dL (ref 6.5–8.1)

## 2017-06-17 LAB — GLUCOSE, CAPILLARY: GLUCOSE-CAPILLARY: 142 mg/dL — AB (ref 65–99)

## 2017-06-17 LAB — CBC
HEMATOCRIT: 42.8 % (ref 36.0–46.0)
Hemoglobin: 14.3 g/dL (ref 12.0–15.0)
MCH: 35.8 pg — ABNORMAL HIGH (ref 26.0–34.0)
MCHC: 33.4 g/dL (ref 30.0–36.0)
MCV: 107 fL — AB (ref 78.0–100.0)
PLATELETS: 138 10*3/uL — AB (ref 150–400)
RBC: 4 MIL/uL (ref 3.87–5.11)
RDW: 13.5 % (ref 11.5–15.5)
WBC: 7.7 10*3/uL (ref 4.0–10.5)

## 2017-06-17 MED ORDER — OXYCODONE HCL ER 20 MG PO T12A
60.0000 mg | EXTENDED_RELEASE_TABLET | Freq: Two times a day (BID) | ORAL | Status: DC
  Administered 2017-06-17: 60 mg via ORAL
  Filled 2017-06-17: qty 3

## 2017-06-17 MED ORDER — FAMOTIDINE 20 MG PO TABS: 20.0000 mg | ORAL_TABLET | Freq: Two times a day (BID) | ORAL | Status: DC

## 2017-06-17 MED ORDER — ACETAMINOPHEN 325 MG PO TABS
650.0000 mg | ORAL_TABLET | Freq: Four times a day (QID) | ORAL | Status: DC | PRN
  Filled 2017-06-17: qty 2

## 2017-06-17 MED ORDER — PANTOPRAZOLE SODIUM 40 MG PO TBEC
40.0000 mg | DELAYED_RELEASE_TABLET | Freq: Every day | ORAL | Status: DC
  Administered 2017-06-17: 40 mg via ORAL
  Filled 2017-06-17: qty 1

## 2017-06-17 MED ORDER — PRAVASTATIN SODIUM 10 MG PO TABS
20.0000 mg | ORAL_TABLET | Freq: Every day | ORAL | Status: DC
  Administered 2017-06-17: 20 mg via ORAL
  Filled 2017-06-17: qty 2

## 2017-06-17 MED ORDER — OXYCODONE-ACETAMINOPHEN 5-325 MG PO TABS
2.0000 | ORAL_TABLET | ORAL | Status: DC | PRN
  Administered 2017-06-17 (×2): 2 via ORAL
  Filled 2017-06-17 (×2): qty 2

## 2017-06-17 MED ORDER — LEVOTHYROXINE SODIUM 100 MCG PO TABS
200.0000 ug | ORAL_TABLET | Freq: Every day | ORAL | Status: DC
  Administered 2017-06-17: 200 ug via ORAL
  Filled 2017-06-17: qty 2

## 2017-06-17 MED ORDER — ASPIRIN 325 MG PO TABS
325.0000 mg | ORAL_TABLET | Freq: Every day | ORAL | Status: DC
  Administered 2017-06-17: 325 mg via ORAL
  Filled 2017-06-17: qty 1

## 2017-06-17 MED ORDER — DIAZEPAM 5 MG PO TABS: 2.5000 mg | ORAL_TABLET | Freq: Four times a day (QID) | ORAL | Status: DC | PRN

## 2017-06-17 MED ORDER — DIAZEPAM 5 MG PO TABS
5.0000 mg | ORAL_TABLET | Freq: Four times a day (QID) | ORAL | Status: DC | PRN
  Administered 2017-06-17: 5 mg via ORAL
  Filled 2017-06-17: qty 1

## 2017-06-17 MED ORDER — DIAZEPAM 5 MG PO TABS: 2.5000 mg | ORAL_TABLET | Freq: Three times a day (TID) | ORAL | 0 refills | Status: DC

## 2017-06-17 MED ORDER — SODIUM CHLORIDE 0.9 % IV SOLN
INTRAVENOUS | Status: DC
  Administered 2017-06-17: 04:00:00 via INTRAVENOUS

## 2017-06-17 MED ORDER — ENOXAPARIN SODIUM 40 MG/0.4ML ~~LOC~~ SOLN
40.0000 mg | SUBCUTANEOUS | Status: DC
Start: 2017-06-17 — End: 2017-06-17
  Administered 2017-06-17: 40 mg via SUBCUTANEOUS
  Filled 2017-06-17: qty 0.4

## 2017-06-17 MED ORDER — ONDANSETRON HCL 4 MG/2ML IJ SOLN: 4.0000 mg | Freq: Four times a day (QID) | INTRAMUSCULAR | Status: DC | PRN

## 2017-06-17 MED ORDER — ONDANSETRON HCL 4 MG PO TABS: 4.0000 mg | ORAL_TABLET | Freq: Four times a day (QID) | ORAL | Status: DC | PRN

## 2017-06-17 MED ORDER — LISINOPRIL 10 MG PO TABS
10.0000 mg | ORAL_TABLET | Freq: Every day | ORAL | Status: DC
  Administered 2017-06-17: 10 mg via ORAL
  Filled 2017-06-17: qty 1

## 2017-06-17 MED ORDER — ACETAMINOPHEN 650 MG RE SUPP: 650.0000 mg | Freq: Four times a day (QID) | RECTAL | Status: DC | PRN

## 2017-06-17 NOTE — Consult Note (Signed)
Consultation Note Date: 06/17/2017   Patient Name: Deborah Russo  DOB: 03-26-1961  MRN: [id number]  Age / Sex: 56 y.o., female  PCP: Chipper Herb, MD Referring Physician: Janece Canterbury, MD  Reason for Consultation: Establishing goals of care, Pain control and Psychosocial/spiritual support  HPI/Patient Profile: 56 y.o. female  with past medical history of Chronic abdominal pain since approximately 1991 when she was 9 months pregnant with her daughter, chronic migraine, hypothyroidism, sleep apnea, history of stroke April 2018 admitted for observation on 06/16/2017 with altered mental status calls unclear, possibly/likely polypharmacy.   Clinical Assessment and Goals of Care: Deborah Russo is resting quietly in bed. She greets me making and keeping contact as I enter. She tells me that she is having pain at this time. We talk about her pain management regimen. She states that she has a trusted neurologist of 28 years, Dr. Beatris Si who she sees in the [facility] every 3 months. She states that Dr. Beatris Si manages her pain, but is assisting her with finding a pain management clinic closer to her home.  We talk about Deborah Russo's functional status. She's shares that her eldest sister, Deborah Russo has been living in her home for approximately 6 years. She states that she has needed more help over the last 3 months due to her stroke in April of this year. She states that her sister now helps with bathing and dressing (although Deborah Russo is able to move and looks capable) and also cooks meals for them.   Deborah Russo states that she feels that she is had another stroke. We talk about further testing such as MRI.  She declines to do further testing today stating it's difficult for her being in the MRI suite for so long.  We talk about Deborah Russo's pain management regimen. She states that she has enough of her  prescription left to get her through until the 7/13 appointment with her trusted neurologist, Dr. Beatris Si. I share our concern about the amount of possibly mind altering medication that she takes. Ms. Russo states she is been on this regimen for quite some time, but is willing to try reduction. She states that she is willing to do what needs to be done in order to find pain relief.   Healthcare power of attorney NEXT OF KIN - Deborah Russo has not complete legal paperwork, but she requested her eldest sister, Deborah Russo be her Ambulance person. She does have an adult daughter approximate 48 years old, Deborah Russo. Both parents are deceased. Deborah Russo has a younger daughter, Deborah Russo, who she is not close with.   SUMMARY OF RECOMMENDATIONS   continue to treat the treatable, and no extraordinary measures such as CPR or intubation. Deborah Russo is scheduled to follow-up with her trusted neurologist of 28 years, Dr. Beatris Si in [place] on 7/13.  Code Status/Advance Care Planning:  DNR  Symptom Management:   patient agreeable to reduction in Valium from Valium 5 mg PO TID to Valium 2.5 mg PO TID.  Patient is agreeable to discontinue gabapentin 100 mg TID.   We discuss interventional radiology appointment for possible improved pain management. Deborah Russo is scheduled to follow-up with her trusted neurologist of 28 years, Dr. Beatris Si in [place] on 7/13.  Ms. Russo states that Dr. Beatris Si is helping her find a pain management clinic closer to her home.  Palliative Prophylaxis:   Delirium Protocol and Frequent Pain Assessment  Additional Recommendations (Limitations, Scope, Preferences):  Continue to treat the treatable but no CPR, no intubation.  Psycho-social/Spiritual:   Desire for further Chaplaincy support:no  Additional Recommendations: Caregiving  Support/Resources  Prognosis:   Unable to determine, based on outcomes. Several years anticipated, but a sudden acute  stroke could change Deborah Russo's trajectory.  Discharge Planning: Return home with the help of her sister, Deborah Russo.      Primary Diagnoses: Present on Admission: . Altered mental status . Hypothyroidism . HTN (hypertension)   I have reviewed the medical record, interviewed the patient and family, and examined the patient. The following aspects are pertinent.  Past Medical History:  Diagnosis Date  . Chronic abdominal pain    since approximately 1991  . Chronic migraine   . Chronic pain   . Hypothyroidism   . Nausea, vomiting, and diarrhea    recurrent, chronic  . Pain management   . Sleep apnea    has CPAP, but doesn't wear it bc she says "i can't sleep with it on"  . Stroke [facility])    Social History   Social History  . Marital status: Divorced    Spouse name: N/A  . Number of children: N/A  . Years of education: N/A   Social History Main Topics  . Smoking status: Current Every Day Smoker    Packs/day: 0.50    Years: 41.00    Types: Cigarettes  . Smokeless tobacco: Never Used  . Alcohol use No  . Drug use: No  . Sexual activity: Not Asked   Other Topics Concern  . None   Social History Narrative  . None   Family History  Problem Relation Age of Onset  . Asthma Mother   . Cancer Father    Scheduled Meds: . aspirin  325 mg Oral Daily  . enoxaparin (LOVENOX) injection  40 mg Subcutaneous Q24H  . [START ON 06/18/2017] famotidine  20 mg Oral BID  . levothyroxine  200 mcg Oral QAC breakfast  . lisinopril  10 mg Oral Daily  . oxyCODONE  60 mg Oral Q12H  . pantoprazole  40 mg Oral Daily  . pravastatin  20 mg Oral Daily   Continuous Infusions: PRN Meds:.acetaminophen **OR** acetaminophen, diazepam, ondansetron **OR** ondansetron (ZOFRAN) IV, oxyCODONE-acetaminophen Medications Prior to Admission:  Prior to Admission medications   Medication Sig Start Date End Date Taking? Authorizing Provider  aspirin 325 MG tablet Take 1 tablet (325 mg total) by  mouth daily. 04/02/17  Yes Orvan Falconer, MD  baclofen (LIORESAL) 10 MG tablet Take 10 mg by mouth daily. 04/09/17  Yes [provider]  diazepam (VALIUM) 5 MG tablet Take 5 mg by mouth 3 (three) times daily.   Yes [provider]  famotidine (PEPCID) 20 MG tablet Take 1 tablet (20 mg total) by mouth 2 (two) times daily. 04/27/17  Yes Julianne Rice, MD  gabapentin (NEURONTIN) 100 MG capsule Take 1 capsule (100 mg total) by mouth 3 (three) times daily. 04/27/17  Yes Julianne Rice, MD  levothyroxine (SYNTHROID, LEVOTHROID) 200 MCG tablet Take 1 tablet (200  mcg total) by mouth daily before breakfast. 04/02/17  Yes Orvan Falconer, MD  lisinopril (PRINIVIL,ZESTRIL) 10 MG tablet TAKE 1 TABLET DAILY 03/14/17  Yes Eustaquio Maize, MD  omeprazole (PRILOSEC) 20 MG capsule TAKE 1 TABLET DAILY 02/08/17  Yes Eustaquio Maize, MD  oxyCODONE (OXYCONTIN) 80 mg 12 hr tablet Take 1 tablet (80 mg total) by mouth every 12 (twelve) hours. 04/01/17  Yes Orvan Falconer, MD  oxyCODONE-acetaminophen (PERCOCET) 10-325 MG tablet Take 1 tablet by mouth 2 (two) times daily.   Yes [provider]  pravastatin (PRAVACHOL) 20 MG tablet TAKE 1 TABLET DAILY 03/14/17  Yes Eustaquio Maize, MD  senna-docusate (SENOKOT-S) 8.6-50 MG tablet Take 1 tablet by mouth at bedtime as needed for mild constipation. 04/01/17  Yes Orvan Falconer, MD  sucralfate (CARAFATE) 1 g tablet TAKE  (1)  TABLET  THREE TIMES DAILY AS NEEDED. Patient taking differently: TAKE  (1)  TABLET  THREE TIMES DAILY AS NEEDED FOR STOMACH PAIN 01/08/17  Yes Eustaquio Maize, MD  SUMAtriptan (IMITREX) 100 MG tablet Take 100 mg by mouth daily as needed for migraine. 04/09/17  Yes [provider]   Allergies  Allergen Reactions  . Penicillins Hives    Has patient had a PCN reaction causing immediate rash, facial/tongue/throat swelling, SOB or lightheadedness with hypotension: No Has patient had a PCN reaction causing severe rash involving mucus membranes or skin  necrosis: No Has patient had a PCN reaction that required hospitalization No Has patient had a PCN reaction occurring within the last 10 years: No If all of the above answers are "NO", then may proceed with Cephalosporin use.   . Sulfa Antibiotics Hives   Review of Systems  Unable to perform ROS: Mental status change    Physical Exam  Constitutional: She is oriented to person, place, and time. No distress.  Makes and keeps eye contact, calm and cooperative, some pressured speech  HENT:  Head: Normocephalic and atraumatic.  Cardiovascular: Normal rate.   Pulmonary/Chest: Effort normal. No respiratory distress.  Abdominal: Soft. She exhibits no distension.  Neurological: She is alert and oriented to person, place, and time.  Skin: Skin is warm and dry.  Bruising bilateral wrists, looks older than stated age  Nursing note and vitals reviewed.   Vital Signs: BP (!) 156/60 (BP Location: Left Arm)   Pulse 89   Temp 98.3 F (36.8 C)   Resp 16   SpO2 95%  Pain Assessment: 0-10   Pain Score: 10-Worst pain ever   SpO2: SpO2: 95 % O2 Device:SpO2: 95 % O2 Flow Rate: .   IO: Intake/output summary:  Intake/Output Summary (Last 24 hours) at 06/17/17 1303 Last data filed at 06/17/17 0900  Gross per 24 hour  Intake              145 ml  Output                0 ml  Net              145 ml    LBM:   Baseline Weight:   Most recent weight:       Palliative Assessment/Data:   Flowsheet Rows     Most Recent Value  Intake Tab  Referral Department  Hospitalist  Unit at Time of Referral  Med/Surg Unit  Palliative Care Primary Diagnosis  Other (Comment)  Date Notified  06/17/17  Palliative Care Type  New Palliative care  Reason for referral  Pain, Clarify  Goals of Care  Date of Admission  06/16/17  Date first seen by Palliative Care  06/17/17  # of days Palliative referral response time  0 Day(s)  # of days IP prior to Palliative referral  1  Clinical Assessment  Palliative  Performance Scale Score  60%  Pain Max last 24 hours  10  Pain Min Last 24 hours  1  Dyspnea Max Last 24 Hours  0  Dyspnea Min Last 24 hours  0  Psychosocial & Spiritual Assessment  Palliative Care Outcomes  Patient/Family meeting held?  Yes  Who was at the meeting?  Patient only today  Palliative Care Outcomes  Provided advance care planning, Clarified goals of care  Patient/Family wishes: Interventions discontinued/not started   Mechanical Ventilation      Time In: 1030 Time Out: 1125 Time Total: 55 minutes Greater than 50%  of this time was spent counseling and coordinating care related to the above assessment and plan.  Signed by: Drue Novel, NP   Please contact Palliative Medicine Team phone at [phone] for questions and concerns.  For individual provider: See Shea Evans

## 2017-06-17 NOTE — Evaluation (Signed)
Speech Language Pathology Evaluation Patient Details Name: DEBROH SIELOFF MRN: [id number] DOB: 14-Jun-1961 Today's Date: 06/17/2017 Time: 1100-1120 SLP Time Calculation (min) (ACUTE ONLY): 20 min  Problem List:  Patient Active Problem List   Diagnosis Date Noted  . Altered mental status 06/17/2017  . Acute CVA (cerebrovascular accident) ([place]) 03/29/2017  . Tobacco abuse 03/29/2017  . HTN (hypertension) 03/29/2017  . Aortic atherosclerosis ([place]) 09/29/2016  . Chronic migraine 09/29/2016  . H/O Clostridium difficile infection 07/13/2014  . Diarrhea 07/13/2014  . FTT (failure to thrive) in adult 07/13/2014  . Abdominal pain, unspecified site 07/13/2014  . Chronic abdominal pain   . Abdominal pain 06/19/2014  . Hypothyroidism 06/19/2014  . Hypokalemia 06/19/2014  . Chronic pain 06/19/2014  . Anemia 06/19/2014  . Abscess of axilla, left 03/28/2013   Past Medical History:  Past Medical History:  Diagnosis Date  . Chronic abdominal pain    since approximately 1991  . Chronic migraine   . Chronic pain   . Hypothyroidism   . Nausea, vomiting, and diarrhea    recurrent, chronic  . Pain management   . Sleep apnea    has CPAP, but doesn't wear it bc she says "i can't sleep with it on"  . Stroke [facility])    Past Surgical History:  Past Surgical History:  Procedure Laterality Date  . ABDOMINAL HYSTERECTOMY    . ABDOMINAL SURGERY    . BIOPSY N/A 07/17/2014   Procedure: BIOPSY;  Surgeon: Rogene Houston, MD;  Location: [facility];  Service: Endoscopy;  Laterality: N/A;  . BREAST SURGERY Left    removed nipple  . CESAREAN SECTION    . CHOLECYSTECTOMY    . ESOPHAGOGASTRODUODENOSCOPY N/A 07/17/2014   Procedure: ESOPHAGOGASTRODUODENOSCOPY (EGD);  Surgeon: Rogene Houston, MD;  Location: [facility];  Service: Endoscopy;  Laterality: N/A;   HPI:  Pt is a 56 y.o. female with PMH of chronic abdominal pain,hypothyroidism, sleep apnea, stroke who was brought to the hospital by patient's sister  in the morning after she became confused and drowsy around 10 AM on 6/28. Head CT showed encephalomalacia at the site of recent (April 2018) left ACA infarct. No acute intracranial abnormality. Pt was screened by SLP following CVA in April 2018 with no need indicated for further evaluation. SLP eval ordered on this admission as part of stroke workup.    Assessment / Plan / Recommendation Clinical Impression  Pt currently presenting with mild cognitive impairments in the areas of orientation, short-term memory/ recall of new information; pt and family report that pt has improved a great deal since admission to hospital but not quite at baseline. Pt oriented to self, location, and situation but disoriented to time; experiencing amnesia to yesterday's events and difficulty following multi-step directions. Pt currently experiencing abdominal pain; repeatedly requesting more medication- RN informed. Pt's pain level limited the depth of this evaluation. Motor speech and expressive language skills judged to be [facility]. SLP will continue to follow while pt in acute care to monitor progress/ determine if there is a need for continued SLP services upon discharge; however given progress from yesterday to today suspect that pt will continue to progress toward baseline.     SLP Assessment  SLP Recommendation/Assessment: Patient needs continued Speech Lanaguage Pathology Services SLP Visit Diagnosis: Cognitive communication deficit (R41.841)    Follow Up Recommendations  None    Frequency and Duration min 1 x/week  1 week      SLP Evaluation Cognition  Overall Cognitive Status: Impaired/Different  from baseline Arousal/Alertness: Awake/alert Orientation Level: Oriented to person;Oriented to place;Oriented to situation;Disoriented to time Attention: Selective;Alternating Selective Attention: Appears intact Alternating Attention: Appears intact Memory: Impaired Memory Impairment: Decreased recall of new  information Awareness: Appears intact Safety/Judgment: Appears intact       Comprehension  Auditory Comprehension Overall Auditory Comprehension: Impaired Yes/No Questions: Within Functional Limits Commands: Impaired Two Step Basic Commands: 75-100% accurate Multistep Basic Commands: 50-74% accurate Conversation: Simple Visual Recognition/Discrimination Discrimination: Within Function Limits Reading Comprehension Reading Status: Within funtional limits    Expression Expression Primary Mode of Expression: Verbal Verbal Expression Overall Verbal Expression: Appears within functional limits for tasks assessed Initiation: No impairment Level of Generative/Spontaneous Verbalization: Conversation Repetition: No impairment Naming: No impairment Non-Verbal Means of Communication: Not applicable   Oral / Motor  Oral Motor/Sensory Function Overall Oral Motor/Sensory Function: Within functional limits Motor Speech Overall Motor Speech: Appears within functional limits for tasks assessed Intelligibility: Intelligible   GO          Functional Assessment Tool Used:  (clinical judgment) Functional Limitations: Memory Memory Current Status (T8882): At least 20 percent but less than 40 percent impaired, limited or restricted Memory Goal Status (C0034): At least 1 percent but less than 20 percent impaired, limited or restricted         Kern Reap, Aniwa, CCC-SLP 06/17/2017, 11:32 AM [phone]

## 2017-06-17 NOTE — Care Management Note (Signed)
Case Management Note  Patient Details  Name: Deborah Russo MRN: [id number] Date of Birth: [birth year]  Subjective/Objective:                  Pt from home, ind with ADL's. Lives with her sister. Pt uses walker with ambulation. Pt very close with her neurologist. Pt has no needs at this time.   Action/Plan: Pt discharging home today with self care. No CM needs.   Expected Discharge Date:  06/17/17               Expected Discharge Plan:  Home/Self Care  In-House Referral:  Hospice / Palliative Care  Discharge planning Services  CM Consult  Post Acute Care Choice:  NA Choice offered to:  NA  Status of Service:  Completed, signed off  Sherald Barge, RN 06/17/2017, 2:28 PM

## 2017-06-17 NOTE — Discharge Summary (Signed)
Physician Discharge Summary  Deborah Russo EXH:[id number] DOB: 09-Oct-1961 DOA: 06/16/2017  PCP: Chipper Herb, MD  Admit date: 06/16/2017 Discharge date: 06/17/2017  Admitted From: home  Disposition:  home  Recommendations for Outpatient Follow-up:  1. Follow up with Dr. Beatris Si at already scheduled appointment in July  2. Reduced valium and stopped gabapentin and baclofen 3. Continued oxycontin and percocet as before.  Defer taper to Dr. Beatris Si 4. PCP to check TSH in 3-4 weeks and adjust levothyroxine.  May need a small increase in dose  Home Health:  none  Equipment/Devices:  None  Discharge Condition:  Stable, improved CODE STATUS:  DNR  Diet recommendation:  Healthy heart  Brief/Interim Summary:  Deborah Russo  is a 56 y.o. female with history of chronic abdominal pain, hypothyroidism, sleep apnea, and previous stroke who was brought to the hospital by patient's sister in the morning after she became confused and drowsy around 10 AM. Sister did not believe that patient took additional medications as she gives patient her medicines.  In the ER, Ms. Lyttle awoke but was confused.  She complained of abdominal pain.  Head CT demonstrated no acute change and patient declined MRI.  There were no signs or symptoms of infection.  She remained afebrile, no leukocytosis.  UA was negative.  CXR not completed due to lack of symptoms.  Her TSH was 8.128.  She improved after two doses of narcan, becoming agitated and more alert after each dose.    Discharge Diagnoses:  Active Problems:   Hypothyroidism   HTN (hypertension)   Altered mental status  Acute toxic encephalopathy due to polypharmacy, unintentional and has been on the same prescriptions for years.  She may also have had a stroke, however, she never had any hemiparesis noted by family or staff and her symptoms improved with narcan.  Patient declined further work up for TIA/stroke.  She is on aspirin and statin.  No history of  seizures and no witnessed seizures.  Doubt underlying infection.  She is on baclofen, valium, gabapentin, oxycontin 65m BID, and percocet 10-325 BID.  I called her pain clinic but was unable to speak to a provider.  I left a message to call back, but have not received a call at the time of this note.  I stopped her gabapentin and consulted palliative care to have a second opinion about how to continue to treat Ms. Depoy's chronic severe pain with less risk to her life.  We ultimately decided to reduce her valium, stop her gabapentin and baclofen and leave her narcotics the same pending her follow up in a few weeks with Dr. FBeatris Si  The patient was in agreement with this plan, however, she also stated that she wants her pain treated maximally even at risk to her life.  She elected to change her code status to DNR and would like to emphasize comfort.  This conversation should be continued by Dr. FBeatris Si  Chronic pain, see above.    Hypertension, stable, no changes.    Hypothyroidism, TSH in April was 412 currently 8.  In April her dose was increased from 1767m to 20059m     Discharge Instructions  Discharge Instructions    Call MD for:  difficulty breathing, headache or visual disturbances    Complete by:  As directed    Call MD for:  extreme fatigue    Complete by:  As directed    Call MD for:  hives    Complete by:  As  directed    Call MD for:  persistant dizziness or light-headedness    Complete by:  As directed    Call MD for:  persistant nausea and vomiting    Complete by:  As directed    Call MD for:  severe uncontrolled pain    Complete by:  As directed    Call MD for:  temperature >100.4    Complete by:  As directed    Diet - low sodium heart healthy    Complete by:  As directed    Discharge instructions    Complete by:  As directed    Please stop your baclofen and gabapentin and cut your valium in half until you can follow up with Dr. Beatris Si at your already scheduled  appointment in 2 weeks.   Increase activity slowly    Complete by:  As directed        Medication List    STOP taking these medications   baclofen 10 MG tablet Commonly known as:  LIORESAL   gabapentin 100 MG capsule Commonly known as:  NEURONTIN     TAKE these medications   aspirin 325 MG tablet Take 1 tablet (325 mg total) by mouth daily.   diazepam 5 MG tablet Commonly known as:  VALIUM Take 0.5 tablets (2.5 mg total) by mouth 3 (three) times daily. What changed:  how much to take   famotidine 20 MG tablet Commonly known as:  PEPCID Take 1 tablet (20 mg total) by mouth 2 (two) times daily.   levothyroxine 200 MCG tablet Commonly known as:  SYNTHROID, LEVOTHROID Take 1 tablet (200 mcg total) by mouth daily before breakfast.   lisinopril 10 MG tablet Commonly known as:  PRINIVIL,ZESTRIL TAKE 1 TABLET DAILY   omeprazole 20 MG capsule Commonly known as:  PRILOSEC TAKE 1 TABLET DAILY   oxyCODONE 80 mg 12 hr tablet Commonly known as:  OXYCONTIN Take 1 tablet (80 mg total) by mouth every 12 (twelve) hours.   oxyCODONE-acetaminophen 10-325 MG tablet Commonly known as:  PERCOCET Take 1 tablet by mouth 2 (two) times daily.   pravastatin 20 MG tablet Commonly known as:  PRAVACHOL TAKE 1 TABLET DAILY   senna-docusate 8.6-50 MG tablet Commonly known as:  Senokot-S Take 1 tablet by mouth at bedtime as needed for mild constipation.   sucralfate 1 g tablet Commonly known as:  CARAFATE TAKE  (1)  TABLET  THREE TIMES DAILY AS NEEDED. What changed:  See the new instructions.   SUMAtriptan 100 MG tablet Commonly known as:  IMITREX Take 100 mg by mouth daily as needed for migraine.      Follow-up Information    Chipper Herb, MD. Schedule an appointment as soon as possible for a visit.   Specialty:  Family Medicine Why:  as needed Contact information: [place] [address] [phone]        Drenda Freeze, MD. Go to.   Specialty:   Psychiatry Why:  Already scheduled appointment in July         Allergies  Allergen Reactions  . Penicillins Hives    Has patient had a PCN reaction causing immediate rash, facial/tongue/throat swelling, SOB or lightheadedness with hypotension: No Has patient had a PCN reaction causing severe rash involving mucus membranes or skin necrosis: No Has patient had a PCN reaction that required hospitalization No Has patient had a PCN reaction occurring within the last 10 years: No If all of the above answers are "NO",  then may proceed with Cephalosporin use.   . Sulfa Antibiotics Hives    Consultations: Palliative care   Procedures/Studies: Ct Head Wo Contrast  Result Date: 06/16/2017 CLINICAL DATA:  Altered mental status EXAM: CT HEAD WITHOUT CONTRAST TECHNIQUE: Contiguous axial images were obtained from the base of the skull through the vertex without intravenous contrast. COMPARISON:  Brain MRI 05/24/2017 FINDINGS: Brain: No mass lesion, intraparenchymal hemorrhage or extra-axial collection. No evidence of acute cortical infarct. Encephalomalacia of within the posterior left anterior cerebral artery distribution. Old left basal ganglia lacunar infarct. Brain parenchyma is otherwise normal for age. Vascular: No hyperdense vessel or unexpected calcification. Skull: Normal visualized skull base, calvarium and extracranial soft tissues. Sinuses/Orbits: No sinus fluid levels or advanced mucosal thickening. No mastoid effusion. Normal orbits. IMPRESSION: Encephalomalacia at the site of recent (April 2018) left ACA infarct. No acute intracranial abnormality. Electronically Signed   By: Ulyses Jarred M.D.   On: 06/16/2017 20:13   Mr Jodene Nam Head Wo Contrast  Result Date: 05/24/2017 CLINICAL DATA:  56 year old female with altered mental status and weakness for 3 weeks. Numbness. Distal left ACA territory infarcts in April this year. EXAM: MRI HEAD WITHOUT CONTRAST MRA HEAD WITHOUT CONTRAST TECHNIQUE:  Multiplanar, multiecho pulse sequences of the brain and surrounding structures were obtained without intravenous contrast. Angiographic images of the head were obtained using MRA technique without contrast. COMPARISON:  Head CT without contrast 04/27/2017. Brain MRI and intracranial MRA 03/29/2017. FINDINGS: MRI HEAD FINDINGS Brain: Developing encephalomalacia in the posteromedial left hemisphere, distal left ACA territory corresponding to the site of ischemia on 03/29/2017. Mild hemosiderin. Additionally, there is a small area of cortical encephalomalacia in the inferior left parietal lobe, left ACA/MCA watershed territory which is new from the prior MRI (series 9, image 27). No convincing diffusion restriction today. Pearline Cables and white matter signal elsewhere in the brain is stable since April, including a chronic lacunar infarct of the right caudate nucleus. No midline shift, mass effect, evidence of mass lesion, ventriculomegaly, extra-axial collection or acute intracranial hemorrhage. Cervicomedullary junction and pituitary are within normal limits. Vascular: Major intracranial vascular flow voids are stable and appear normal. Skull and upper cervical spine: Negative. Sinuses/Orbits: Stable and negative. Other: Trace left inferior mastoid air cell fluid is stable and appears inconsequential. Mastoids are otherwise clear. Visible internal auditory structures appear normal. Negative scalp soft tissues. MRA HEAD FINDINGS This MRA is of superior technical quality compared to that on 03/29/2017. Antegrade flow in the posterior circulation with patent distal vertebral arteries, the right is dominant. Patent right PICA origin. No distal vertebral stenosis. Patent vertebrobasilar junction and basilar artery without stenosis. SCA and PCA origins appear patent and within normal limits. Posterior communicating arteries are diminutive or absent. Bilateral PCA branches appear stable and within normal limits. Antegrade flow in  both ICA siphons. Negative distal cervical ICAs. No siphon stenosis. Normal ophthalmic artery origins. Patent carotid termini. MCA and ACA origins appear within normal limits. MCA M1 segments, MCA bifurcations, and visible MCA branches appear within normal limits. Anterior communicating artery is normal. Dominant appearing left ACA A1 segment, versus dominant left ACA in general. Visible bilateral ACA branches are within normal limits, visible through the A2 segment nearly to the pericallosal artery level. IMPRESSION: 1. Mild extension of the distal left ACA territory infarct into the left parietal lobe since the prior MRI on 03/29/2017. However, no superimposed acute ischemia is identified. 2. Developing encephalomalacia related to #1 with mild hemosiderin but no mass effect or acute intracranial  hemorrhage. 3. Chronic lacunar infarct in the right caudate. 4. Negative intracranial MRA. Visible left ACA branches are patent. Electronically Signed   By: Genevie Ann M.D.   On: 05/24/2017 10:05   Mr Brain Wo Contrast  Result Date: 05/24/2017 CLINICAL DATA:  56 year old female with altered mental status and weakness for 3 weeks. Numbness. Distal left ACA territory infarcts in April this year. EXAM: MRI HEAD WITHOUT CONTRAST MRA HEAD WITHOUT CONTRAST TECHNIQUE: Multiplanar, multiecho pulse sequences of the brain and surrounding structures were obtained without intravenous contrast. Angiographic images of the head were obtained using MRA technique without contrast. COMPARISON:  Head CT without contrast 04/27/2017. Brain MRI and intracranial MRA 03/29/2017. FINDINGS: MRI HEAD FINDINGS Brain: Developing encephalomalacia in the posteromedial left hemisphere, distal left ACA territory corresponding to the site of ischemia on 03/29/2017. Mild hemosiderin. Additionally, there is a small area of cortical encephalomalacia in the inferior left parietal lobe, left ACA/MCA watershed territory which is new from the prior MRI (series 9,  image 27). No convincing diffusion restriction today. Pearline Cables and white matter signal elsewhere in the brain is stable since April, including a chronic lacunar infarct of the right caudate nucleus. No midline shift, mass effect, evidence of mass lesion, ventriculomegaly, extra-axial collection or acute intracranial hemorrhage. Cervicomedullary junction and pituitary are within normal limits. Vascular: Major intracranial vascular flow voids are stable and appear normal. Skull and upper cervical spine: Negative. Sinuses/Orbits: Stable and negative. Other: Trace left inferior mastoid air cell fluid is stable and appears inconsequential. Mastoids are otherwise clear. Visible internal auditory structures appear normal. Negative scalp soft tissues. MRA HEAD FINDINGS This MRA is of superior technical quality compared to that on 03/29/2017. Antegrade flow in the posterior circulation with patent distal vertebral arteries, the right is dominant. Patent right PICA origin. No distal vertebral stenosis. Patent vertebrobasilar junction and basilar artery without stenosis. SCA and PCA origins appear patent and within normal limits. Posterior communicating arteries are diminutive or absent. Bilateral PCA branches appear stable and within normal limits. Antegrade flow in both ICA siphons. Negative distal cervical ICAs. No siphon stenosis. Normal ophthalmic artery origins. Patent carotid termini. MCA and ACA origins appear within normal limits. MCA M1 segments, MCA bifurcations, and visible MCA branches appear within normal limits. Anterior communicating artery is normal. Dominant appearing left ACA A1 segment, versus dominant left ACA in general. Visible bilateral ACA branches are within normal limits, visible through the A2 segment nearly to the pericallosal artery level. IMPRESSION: 1. Mild extension of the distal left ACA territory infarct into the left parietal lobe since the prior MRI on 03/29/2017. However, no superimposed acute  ischemia is identified. 2. Developing encephalomalacia related to #1 with mild hemosiderin but no mass effect or acute intracranial hemorrhage. 3. Chronic lacunar infarct in the right caudate. 4. Negative intracranial MRA. Visible left ACA branches are patent. Electronically Signed   By: Genevie Ann M.D.   On: 05/24/2017 10:05    none   Subjective: Having her usual severe left sided and epigastric abdominal pains.  Does not remember anything from yesterday, but remembers that she was feeling normal/well and nearly pain free the day before.  Denies recent fevers, chills, cough, SOB, dysuria.    Discharge Exam: Vitals:   06/17/17 0359 06/17/17 0825  BP: (!) 156/60   Pulse: 89   Resp: 16   Temp: 99 F (37.2 C) 98.3 F (36.8 C)   Vitals:   06/17/17 0030 06/17/17 0240 06/17/17 0359 06/17/17 0825  BP: (!) 137/91  132/86 (!) 156/60   Pulse:  86 89   Resp: 17 16 16    Temp:  97 F (36.1 C) 99 F (37.2 C) 98.3 F (36.8 C)  TempSrc:  Axillary Axillary   SpO2:  98% 95%     General: Pt is alert, awake, not in acute distress Cardiovascular: RRR, S1/S2 +, no rubs, no gallops Respiratory: CTA bilaterally, no wheezing, no rhonchi Abdominal:  Normal active bowel sounds, soft, nondistended, TTP in the LUQ without rebound or guarding Extremities: no edema, no cyanosis Neuro:  CN II-XII grossly intact, strength 5/5 throughout, sensation intact to light touch    The results of significant diagnostics from this hospitalization (including imaging, microbiology, ancillary and laboratory) are listed below for reference.     Microbiology: No results found for this or any previous visit (from the past 240 hour(s)).   Labs: BNP (last 3 results) No results for input(s): BNP in the last 8760 hours. Basic Metabolic Panel:  Recent Labs Lab 06/16/17 1802 06/17/17 0601  NA 141 141  K 3.9 3.8  CL 105 107  CO2 27 27  GLUCOSE 131* 113*  BUN 11 10  CREATININE 0.89 0.72  CALCIUM 9.7 9.4   Liver  Function Tests:  Recent Labs Lab 06/16/17 1802 06/17/17 0601  AST 19 18  ALT 14 14  ALKPHOS 116 106  BILITOT 0.6 1.0  PROT 7.9 7.1  ALBUMIN 4.1 3.7   No results for input(s): LIPASE, AMYLASE in the last 168 hours.  Recent Labs Lab 06/16/17 1957  AMMONIA 21   CBC:  Recent Labs Lab 06/16/17 1802 06/17/17 0601  WBC 7.8 7.7  NEUTROABS 5.0  --   HGB 15.2* 14.3  HCT 45.4 42.8  MCV 109.1* 107.0*  PLT 146* 138*   Cardiac Enzymes: No results for input(s): CKTOTAL, CKMB, CKMBINDEX, TROPONINI in the last 168 hours. BNP: Invalid input(s): POCBNP CBG:  Recent Labs Lab 06/16/17 1756 06/16/17 1814  GLUCAP 142* 132*   D-Dimer No results for input(s): DDIMER in the last 72 hours. Hgb A1c No results for input(s): HGBA1C in the last 72 hours. Lipid Profile No results for input(s): CHOL, HDL, LDLCALC, TRIG, CHOLHDL, LDLDIRECT in the last 72 hours. Thyroid function studies  Recent Labs  06/16/17 1805  TSH 8.128*   Anemia work up No results for input(s): VITAMINB12, FOLATE, FERRITIN, TIBC, IRON, RETICCTPCT in the last 72 hours. Urinalysis    Component Value Date/Time   COLORURINE YELLOW 06/16/2017 1802   APPEARANCEUR CLEAR 06/16/2017 1802   LABSPEC 1.008 06/16/2017 1802   PHURINE 6.0 06/16/2017 1802   GLUCOSEU NEGATIVE 06/16/2017 1802   HGBUR SMALL (A) 06/16/2017 1802   BILIRUBINUR NEGATIVE 06/16/2017 1802   KETONESUR NEGATIVE 06/16/2017 1802   PROTEINUR NEGATIVE 06/16/2017 1802   UROBILINOGEN 0.2 09/02/2015 1735   NITRITE NEGATIVE 06/16/2017 1802   LEUKOCYTESUR NEGATIVE 06/16/2017 1802   Sepsis Labs Invalid input(s): PROCALCITONIN,  WBC,  LACTICIDVEN   Time coordinating discharge: Over 30 minutes  SIGNED:   Janece Canterbury, MD  [facility] 06/17/2017, 1:31 PM Pager   If 7PM-7AM, please contact night-coverage [email or web address] Password [email or web address]

## 2017-06-17 NOTE — Progress Notes (Signed)
Late Entry:   0814: Patient request something to drink.  Notified Dr. Sheran Fava since the patient is more alert now than was given in report.  Requested that I redo the RN swallow screen since there had been a change in the patients condition for the better.  New Orders given and followed.  0849:  The patient is constantly complaining  of chronic abdominal pain.  Notified Dr. Sheran Fava new orders given and MD also stated that she notified the patient pain MD at [facility].  Discussed with patient.  9390: Added yellow socks and she removed the socks.  1100 approx: With the patients permission discussed the patients POC and documented reason for admission with the family.  Also updated palliative care nurse practioner Tosha.  D/c note: Patient discharged with instructions, prescription, and care notes.  Verbalized understanding via teach back.  IV was removed and the site was WNL. Patient voiced no further complaints or concerns at the time of discharge.  Appointments scheduled per instructions.  Patient left the floor via w/c family  And staff in stable condition.

## 2017-06-17 NOTE — H&P (Signed)
TRH H&P    Patient Demographics:    Deborah Russo, is a 56 y.o. female  MRN: [id number]  DOB - [birth year]  Admit Date - 06/16/2017  Referring MD/NP/PA: Dr Lita Mains  Outpatient Primary MD for the patient is Chipper Herb, MD  Patient coming from: Home  Chief Complaint  Patient presents with  . Altered Mental Status      HPI:    Deborah Russo  is a 56 y.o. female, With history of chronic abdominal pain, hypothyroidism, sleep apnea, stroke who was brought to the hospital by patient's sister in the morning after she became confused and drowsy around 10 AM. Sister did not believe that patient took additional medications as she gives patient her medicines. At this time patient is awake, though still unable to provide significant history. She denies chest pain, no shortness of breath. No nausea vomiting or diarrhea. Complains of chronic abdominal pain. She goes to pain clinic for pain management. No recent changes in pain medications as per patient.  In the ED labs showed TSH 8.128 CT head showed no acute abnormality   Review of systems:     All other systems reviewed and are negative.   With Past History of the following :    Past Medical History:  Diagnosis Date  . Chronic abdominal pain    since approximately 1991  . Chronic migraine   . Chronic pain   . Hypothyroidism   . Nausea, vomiting, and diarrhea    recurrent, chronic  . Pain management   . Sleep apnea    has CPAP, but doesn't wear it bc she says "i can't sleep with it on"  . Stroke [facility])       Past Surgical History:  Procedure Laterality Date  . ABDOMINAL HYSTERECTOMY    . ABDOMINAL SURGERY    . BIOPSY N/A 07/17/2014   Procedure: BIOPSY;  Surgeon: Rogene Houston, MD;  Location: [facility];  Service: Endoscopy;  Laterality: N/A;  . BREAST SURGERY Left    removed nipple  . CESAREAN SECTION    . CHOLECYSTECTOMY    .  ESOPHAGOGASTRODUODENOSCOPY N/A 07/17/2014   Procedure: ESOPHAGOGASTRODUODENOSCOPY (EGD);  Surgeon: Rogene Houston, MD;  Location: [facility];  Service: Endoscopy;  Laterality: N/A;      Social History:      Social History  Substance Use Topics  . Smoking status: Current Every Day Smoker    Packs/day: 0.50    Years: 41.00    Types: Cigarettes  . Smokeless tobacco: Never Used  . Alcohol use No       Family History :     Family History  Problem Relation Age of Onset  . Asthma Mother   . Cancer Father       Home Medications:   Prior to Admission medications   Medication Sig Start Date End Date Taking? Authorizing Provider  aspirin 325 MG tablet Take 1 tablet (325 mg total) by mouth daily. 04/02/17   Orvan Falconer, MD  baclofen (LIORESAL) 10 MG tablet Take 10 mg by  mouth daily. 04/09/17   [provider]  diazepam (VALIUM) 5 MG tablet Take 5 mg by mouth 3 (three) times daily.    [provider]  famotidine (PEPCID) 20 MG tablet Take 1 tablet (20 mg total) by mouth 2 (two) times daily. 04/27/17   Julianne Rice, MD  gabapentin (NEURONTIN) 100 MG capsule Take 1 capsule (100 mg total) by mouth 3 (three) times daily. 04/27/17   Julianne Rice, MD  levothyroxine (SYNTHROID, LEVOTHROID) 200 MCG tablet Take 1 tablet (200 mcg total) by mouth daily before breakfast. 04/02/17   Orvan Falconer, MD  lisinopril (PRINIVIL,ZESTRIL) 10 MG tablet TAKE 1 TABLET DAILY 03/14/17   Eustaquio Maize, MD  nicotine (NICODERM CQ - DOSED IN MG/24 HOURS) 21 mg/24hr patch Place 1 patch (21 mg total) onto the skin daily. Patient not taking: Reported on 04/27/2017 04/02/17   Orvan Falconer, MD  omeprazole (PRILOSEC) 20 MG capsule TAKE 1 TABLET DAILY 02/08/17   Eustaquio Maize, MD  ondansetron (ZOFRAN ODT) 4 MG disintegrating tablet Take 1 tablet (4 mg total) by mouth every 8 (eight) hours as needed. 04/03/17   Fredia Sorrow, MD  ondansetron (ZOFRAN) 4 MG tablet Take 1 tablet (4 mg total) by mouth every 6 (six)  hours as needed for nausea or vomiting. 04/27/17   Julianne Rice, MD  oxyCODONE (OXYCONTIN) 80 mg 12 hr tablet Take 1 tablet (80 mg total) by mouth every 12 (twelve) hours. 04/01/17   Orvan Falconer, MD  oxyCODONE-acetaminophen (PERCOCET) 10-325 MG tablet Take 1 tablet by mouth 2 (two) times daily.    [provider]  pravastatin (PRAVACHOL) 20 MG tablet TAKE 1 TABLET DAILY 03/14/17   Eustaquio Maize, MD  senna-docusate (SENOKOT-S) 8.6-50 MG tablet Take 1 tablet by mouth at bedtime as needed for mild constipation. 04/01/17   Orvan Falconer, MD  sucralfate (CARAFATE) 1 g tablet TAKE  (1)  TABLET  THREE TIMES DAILY AS NEEDED. Patient taking differently: TAKE  (1)  TABLET  THREE TIMES DAILY AS NEEDED FOR STOMACH PAIN 01/08/17   Eustaquio Maize, MD  SUMAtriptan (IMITREX) 100 MG tablet Take 100 mg by mouth daily as needed for migraine. 04/09/17   [provider]     Allergies:     Allergies  Allergen Reactions  . Penicillins Hives    Has patient had a PCN reaction causing immediate rash, facial/tongue/throat swelling, SOB or lightheadedness with hypotension: No Has patient had a PCN reaction causing severe rash involving mucus membranes or skin necrosis: No Has patient had a PCN reaction that required hospitalization No Has patient had a PCN reaction occurring within the last 10 years: No If all of the above answers are "NO", then may proceed with Cephalosporin use.   . Sulfa Antibiotics Hives     Physical Exam:   Vitals  Blood pressure (!) 137/91, pulse (!) 101, temperature 97.5 F (36.4 C), temperature source Oral, resp. rate 17, SpO2 98 %.  1.  General: Appears in no acute distress  2. Psychiatric: Alert, oriented to place and self,   3. Neurologic: No focal neurological deficits, all cranial nerves intact.Strength 5/5 all 4 extremities, sensation intact all 4 extremities, plantars down going.  4. Eyes :  anicteric sclerae, moist conjunctivae with no lid lag.  PERRLA.  5. ENMT:  Oropharynx clear with moist mucous membranes and good dentition  6. Neck:  supple, no cervical lymphadenopathy appriciated, No thyromegaly  7. Respiratory : Normal respiratory effort, good air movement bilaterally,clear to  auscultation bilaterally  8. Cardiovascular : RRR, no gallops, rubs or murmurs, no leg edema  9. Gastrointestinal:  Positive bowel sounds, abdomen soft, non-tender to palpation,no hepatosplenomegaly, no rigidity or guarding       10. Skin:  No cyanosis, normal texture and turgor, no rash, lesions or ulcers  11.Musculoskeletal:  Good muscle tone,  joints appear normal , no effusions,  normal range of motion    Data Review:    CBC  Recent Labs Lab 06/16/17 1802  WBC 7.8  HGB 15.2*  HCT 45.4  PLT 146*  MCV 109.1*  MCH 36.5*  MCHC 33.5  RDW 13.7  LYMPHSABS 2.3  MONOABS 0.4  EOSABS 0.1  BASOSABS 0.0   ------------------------------------------------------------------------------------------------------------------  Chemistries   Recent Labs Lab 06/16/17 1802  NA 141  K 3.9  CL 105  CO2 27  GLUCOSE 131*  BUN 11  CREATININE 0.89  CALCIUM 9.7  AST 19  ALT 14  ALKPHOS 116  BILITOT 0.6   ------------------------------------------------------------------------------------------------------------------  ------------------------------------------------------------------------------------------------------------------ GFR: CrCl cannot be calculated (Unknown ideal weight.). Liver Function Tests:  Recent Labs Lab 06/16/17 1802  AST 19  ALT 14  ALKPHOS 116  BILITOT 0.6  PROT 7.9  ALBUMIN 4.1   No results for input(s): LIPASE, AMYLASE in the last 168 hours.  Recent Labs Lab 06/16/17 1957  AMMONIA 21   Coagulation Profile:  Recent Labs Lab 06/16/17 1802  INR 0.95   Cardiac Enzymes: No results for input(s): CKTOTAL, CKMB, CKMBINDEX, TROPONINI in the last 168 hours. BNP (last 3 results) No results for  input(s): PROBNP in the last 8760 hours. HbA1C: No results for input(s): HGBA1C in the last 72 hours. CBG:  Recent Labs Lab 06/16/17 1814  GLUCAP 132*   Lipid Profile: No results for input(s): CHOL, HDL, LDLCALC, TRIG, CHOLHDL, LDLDIRECT in the last 72 hours. Thyroid Function Tests:  Recent Labs  06/16/17 1800 06/16/17 1805  TSH  --  8.128*  FREET4 0.72  --    Anemia Panel: No results for input(s): VITAMINB12, FOLATE, FERRITIN, TIBC, IRON, RETICCTPCT in the last 72 hours.  --------------------------------------------------------------------------------------------------------------- Urine analysis:    Component Value Date/Time   COLORURINE YELLOW 06/16/2017 1802   APPEARANCEUR CLEAR 06/16/2017 1802   LABSPEC 1.008 06/16/2017 1802   PHURINE 6.0 06/16/2017 1802   GLUCOSEU NEGATIVE 06/16/2017 1802   HGBUR SMALL (A) 06/16/2017 1802   BILIRUBINUR NEGATIVE 06/16/2017 1802   KETONESUR NEGATIVE 06/16/2017 1802   PROTEINUR NEGATIVE 06/16/2017 1802   UROBILINOGEN 0.2 09/02/2015 1735   NITRITE NEGATIVE 06/16/2017 1802   LEUKOCYTESUR NEGATIVE 06/16/2017 1802      Imaging Results:    Ct Head Wo Contrast  Result Date: 06/16/2017 CLINICAL DATA:  Altered mental status EXAM: CT HEAD WITHOUT CONTRAST TECHNIQUE: Contiguous axial images were obtained from the base of the skull through the vertex without intravenous contrast. COMPARISON:  Brain MRI 05/24/2017 FINDINGS: Brain: No mass lesion, intraparenchymal hemorrhage or extra-axial collection. No evidence of acute cortical infarct. Encephalomalacia of within the posterior left anterior cerebral artery distribution. Old left basal ganglia lacunar infarct. Brain parenchyma is otherwise normal for age. Vascular: No hyperdense vessel or unexpected calcification. Skull: Normal visualized skull base, calvarium and extracranial soft tissues. Sinuses/Orbits: No sinus fluid levels or advanced mucosal thickening. No mastoid effusion. Normal orbits.  IMPRESSION: Encephalomalacia at the site of recent (April 2018) left ACA infarct. No acute intracranial abnormality. Electronically Signed   By: Ulyses Jarred M.D.   On: 06/16/2017 20:13    My personal review  of EKG: Rhythm NSR   Assessment & Plan:    Active Problems:   Hypothyroidism   HTN (hypertension)   Altered mental status   1. Altered mental status- unclear etiology, likely polypharmacy. Will hold opioids at this time. Consider starting medications in a.m. once patient is more alert. 2. Hypertension-continue lisinopril 3. Hypothyroidism-TSH mildly elevated 8.128,? Sick euthyroid syndrome, will need to recheck TSH in 6-8 weeks as outpatient. Continue Synthroid.    DVT Prophylaxis-   Lovenox   AM Labs Ordered, also please review Full Orders  Family Communication: No family present at bedside  Code Status: Full code  Admission status: Observation    Time spent in minutes : 60 minutes   Joleena Weisenburger S M.D on 06/17/2017 at 2:14 AM  Between 7am to 7pm - Pager - [phone]. After 7pm go to [email or web address] - password [facility]  [facility] - Office  [phone]

## 2017-07-01 DIAGNOSIS — G9059 Complex regional pain syndrome I of other specified site: Secondary | ICD-10-CM | POA: Diagnosis not present

## 2017-07-01 DIAGNOSIS — G8929 Other chronic pain: Secondary | ICD-10-CM | POA: Diagnosis not present

## 2017-07-01 DIAGNOSIS — I639 Cerebral infarction, unspecified: Secondary | ICD-10-CM | POA: Diagnosis not present

## 2017-07-01 DIAGNOSIS — G43719 Chronic migraine without aura, intractable, without status migrainosus: Secondary | ICD-10-CM | POA: Diagnosis not present

## 2017-07-11 ENCOUNTER — Telehealth: Payer: Self-pay | Admitting: Pediatrics

## 2017-07-11 NOTE — Telephone Encounter (Signed)
NA / fast busy at the Number in chart

## 2017-07-11 NOTE — Telephone Encounter (Signed)
Can you call pt to schedule follow up with me? She recently saw her neurologist for headaches and he had several medical problems that need PCP follow up including recent strokes.

## 2017-07-13 ENCOUNTER — Other Ambulatory Visit: Payer: Self-pay | Admitting: *Deleted

## 2017-07-13 DIAGNOSIS — N200 Calculus of kidney: Secondary | ICD-10-CM | POA: Diagnosis not present

## 2017-07-13 DIAGNOSIS — K219 Gastro-esophageal reflux disease without esophagitis: Secondary | ICD-10-CM

## 2017-07-13 DIAGNOSIS — R2681 Unsteadiness on feet: Secondary | ICD-10-CM | POA: Diagnosis not present

## 2017-07-13 DIAGNOSIS — I10 Essential (primary) hypertension: Secondary | ICD-10-CM | POA: Diagnosis not present

## 2017-07-13 DIAGNOSIS — R5383 Other fatigue: Secondary | ICD-10-CM | POA: Diagnosis not present

## 2017-07-13 DIAGNOSIS — F17200 Nicotine dependence, unspecified, uncomplicated: Secondary | ICD-10-CM | POA: Diagnosis not present

## 2017-07-13 DIAGNOSIS — R404 Transient alteration of awareness: Secondary | ICD-10-CM | POA: Diagnosis not present

## 2017-07-13 DIAGNOSIS — R300 Dysuria: Secondary | ICD-10-CM | POA: Diagnosis not present

## 2017-07-13 DIAGNOSIS — R5081 Fever presenting with conditions classified elsewhere: Secondary | ICD-10-CM | POA: Diagnosis not present

## 2017-07-13 DIAGNOSIS — T50995A Adverse effect of other drugs, medicaments and biological substances, initial encounter: Secondary | ICD-10-CM | POA: Diagnosis not present

## 2017-07-13 DIAGNOSIS — R0989 Other specified symptoms and signs involving the circulatory and respiratory systems: Secondary | ICD-10-CM | POA: Diagnosis not present

## 2017-07-13 DIAGNOSIS — Z79899 Other long term (current) drug therapy: Secondary | ICD-10-CM | POA: Diagnosis not present

## 2017-07-13 DIAGNOSIS — R5381 Other malaise: Secondary | ICD-10-CM | POA: Diagnosis not present

## 2017-07-13 DIAGNOSIS — R1013 Epigastric pain: Secondary | ICD-10-CM | POA: Diagnosis not present

## 2017-07-13 DIAGNOSIS — Z881 Allergy status to other antibiotic agents status: Secondary | ICD-10-CM | POA: Diagnosis not present

## 2017-07-13 DIAGNOSIS — Z882 Allergy status to sulfonamides status: Secondary | ICD-10-CM | POA: Diagnosis not present

## 2017-07-13 DIAGNOSIS — R531 Weakness: Secondary | ICD-10-CM | POA: Diagnosis not present

## 2017-07-13 DIAGNOSIS — Z90710 Acquired absence of both cervix and uterus: Secondary | ICD-10-CM | POA: Diagnosis not present

## 2017-07-13 DIAGNOSIS — R309 Painful micturition, unspecified: Secondary | ICD-10-CM | POA: Diagnosis not present

## 2017-07-13 DIAGNOSIS — G43909 Migraine, unspecified, not intractable, without status migrainosus: Secondary | ICD-10-CM | POA: Diagnosis not present

## 2017-07-13 DIAGNOSIS — Z8673 Personal history of transient ischemic attack (TIA), and cerebral infarction without residual deficits: Secondary | ICD-10-CM | POA: Diagnosis not present

## 2017-07-13 DIAGNOSIS — M549 Dorsalgia, unspecified: Secondary | ICD-10-CM | POA: Diagnosis not present

## 2017-07-13 DIAGNOSIS — K5730 Diverticulosis of large intestine without perforation or abscess without bleeding: Secondary | ICD-10-CM | POA: Diagnosis not present

## 2017-07-13 DIAGNOSIS — I517 Cardiomegaly: Secondary | ICD-10-CM | POA: Diagnosis not present

## 2017-07-13 DIAGNOSIS — R739 Hyperglycemia, unspecified: Secondary | ICD-10-CM | POA: Diagnosis not present

## 2017-07-13 DIAGNOSIS — R4182 Altered mental status, unspecified: Secondary | ICD-10-CM | POA: Diagnosis not present

## 2017-07-13 DIAGNOSIS — Z91041 Radiographic dye allergy status: Secondary | ICD-10-CM | POA: Diagnosis not present

## 2017-07-13 DIAGNOSIS — G8929 Other chronic pain: Secondary | ICD-10-CM | POA: Diagnosis not present

## 2017-07-13 DIAGNOSIS — Z880 Allergy status to penicillin: Secondary | ICD-10-CM | POA: Diagnosis not present

## 2017-07-13 DIAGNOSIS — R931 Abnormal findings on diagnostic imaging of heart and coronary circulation: Secondary | ICD-10-CM | POA: Diagnosis not present

## 2017-07-13 DIAGNOSIS — Z9049 Acquired absence of other specified parts of digestive tract: Secondary | ICD-10-CM | POA: Diagnosis not present

## 2017-07-13 NOTE — Telephone Encounter (Signed)
What medicine was requested? Also can you set up a hospital follow up appt, 30 min OV. Multiple admissions since last OV.

## 2017-07-14 DIAGNOSIS — I6529 Occlusion and stenosis of unspecified carotid artery: Secondary | ICD-10-CM | POA: Diagnosis not present

## 2017-07-14 MED ORDER — PANTOPRAZOLE SODIUM 20 MG PO TBEC: 20.0000 mg | DELAYED_RELEASE_TABLET | Freq: Every day | ORAL | 0 refills | Status: DC

## 2017-07-14 NOTE — Addendum Note (Signed)
Addended by: Eustaquio Maize on: 07/14/2017 10:20 AM   Modules accepted: Orders

## 2017-08-03 NOTE — Telephone Encounter (Signed)
Several attempts have been made to contact patient this encounter will be closed.  

## 2017-08-08 ENCOUNTER — Other Ambulatory Visit: Payer: Self-pay | Admitting: Pediatrics

## 2017-08-08 DIAGNOSIS — K219 Gastro-esophageal reflux disease without esophagitis: Secondary | ICD-10-CM

## 2017-08-10 ENCOUNTER — Other Ambulatory Visit: Payer: Self-pay | Admitting: *Deleted

## 2017-08-10 NOTE — Telephone Encounter (Signed)
Received fax request to refill Levothyroxine Reviewed notes that pt needed hospital follow-up & last TSH was in June as well which was elevated. Pt's phone number is invalid LMOVM to Deborah Russo to give [place] a call back to schedule pt an appointment as well as to get an updated phone number. Pharmacy fax has same phone number as we have documented

## 2017-08-12 ENCOUNTER — Other Ambulatory Visit: Payer: Self-pay | Admitting: *Deleted

## 2017-08-13 MED ORDER — LEVOTHYROXINE SODIUM 200 MCG PO TABS: 200.0000 ug | ORAL_TABLET | Freq: Every day | ORAL | 0 refills | Status: DC

## 2017-09-03 ENCOUNTER — Other Ambulatory Visit: Payer: Self-pay | Admitting: Pediatrics

## 2017-09-03 DIAGNOSIS — K219 Gastro-esophageal reflux disease without esophagitis: Secondary | ICD-10-CM

## 2017-09-07 ENCOUNTER — Other Ambulatory Visit: Payer: Self-pay | Admitting: Pediatrics

## 2017-09-07 DIAGNOSIS — K219 Gastro-esophageal reflux disease without esophagitis: Secondary | ICD-10-CM

## 2017-09-07 NOTE — Telephone Encounter (Signed)
Attempted to contact patient. Number not in service

## 2017-09-07 NOTE — Telephone Encounter (Signed)
Last seen 10/11/7  Dr Evette Doffing

## 2017-09-23 DIAGNOSIS — G8929 Other chronic pain: Secondary | ICD-10-CM | POA: Diagnosis not present

## 2017-10-27 ENCOUNTER — Ambulatory Visit: Payer: Medicare Other | Admitting: Pediatrics

## 2017-11-24 ENCOUNTER — Other Ambulatory Visit: Payer: Self-pay | Admitting: Pediatrics

## 2018-01-22 DIAGNOSIS — R197 Diarrhea, unspecified: Secondary | ICD-10-CM | POA: Diagnosis not present

## 2018-01-22 DIAGNOSIS — R4182 Altered mental status, unspecified: Secondary | ICD-10-CM | POA: Diagnosis not present

## 2018-01-22 DIAGNOSIS — G894 Chronic pain syndrome: Secondary | ICD-10-CM | POA: Diagnosis not present

## 2018-01-22 DIAGNOSIS — K2970 Gastritis, unspecified, without bleeding: Secondary | ICD-10-CM | POA: Diagnosis not present

## 2018-01-22 DIAGNOSIS — Z90710 Acquired absence of both cervix and uterus: Secondary | ICD-10-CM | POA: Diagnosis not present

## 2018-01-22 DIAGNOSIS — F17200 Nicotine dependence, unspecified, uncomplicated: Secondary | ICD-10-CM | POA: Diagnosis not present

## 2018-01-22 DIAGNOSIS — Z91041 Radiographic dye allergy status: Secondary | ICD-10-CM | POA: Diagnosis not present

## 2018-01-22 DIAGNOSIS — Z79899 Other long term (current) drug therapy: Secondary | ICD-10-CM | POA: Diagnosis not present

## 2018-01-22 DIAGNOSIS — G43909 Migraine, unspecified, not intractable, without status migrainosus: Secondary | ICD-10-CM | POA: Diagnosis not present

## 2018-01-22 DIAGNOSIS — R109 Unspecified abdominal pain: Secondary | ICD-10-CM | POA: Diagnosis not present

## 2018-01-22 DIAGNOSIS — R531 Weakness: Secondary | ICD-10-CM | POA: Diagnosis not present

## 2018-01-22 DIAGNOSIS — Z8673 Personal history of transient ischemic attack (TIA), and cerebral infarction without residual deficits: Secondary | ICD-10-CM | POA: Diagnosis not present

## 2018-01-22 DIAGNOSIS — G8929 Other chronic pain: Secondary | ICD-10-CM | POA: Diagnosis not present

## 2018-01-22 DIAGNOSIS — Z880 Allergy status to penicillin: Secondary | ICD-10-CM | POA: Diagnosis not present

## 2018-01-22 DIAGNOSIS — E039 Hypothyroidism, unspecified: Secondary | ICD-10-CM | POA: Diagnosis not present

## 2018-01-22 DIAGNOSIS — Z882 Allergy status to sulfonamides status: Secondary | ICD-10-CM | POA: Diagnosis not present

## 2018-01-22 DIAGNOSIS — R112 Nausea with vomiting, unspecified: Secondary | ICD-10-CM | POA: Diagnosis not present

## 2018-01-22 DIAGNOSIS — R110 Nausea: Secondary | ICD-10-CM | POA: Diagnosis not present

## 2018-01-22 DIAGNOSIS — Z9049 Acquired absence of other specified parts of digestive tract: Secondary | ICD-10-CM | POA: Diagnosis not present

## 2018-01-23 DIAGNOSIS — G894 Chronic pain syndrome: Secondary | ICD-10-CM | POA: Diagnosis not present

## 2018-01-23 DIAGNOSIS — F17200 Nicotine dependence, unspecified, uncomplicated: Secondary | ICD-10-CM | POA: Diagnosis not present

## 2018-01-23 DIAGNOSIS — R4182 Altered mental status, unspecified: Secondary | ICD-10-CM | POA: Diagnosis not present

## 2018-03-20 DIAGNOSIS — G43719 Chronic migraine without aura, intractable, without status migrainosus: Secondary | ICD-10-CM | POA: Diagnosis not present

## 2018-03-20 DIAGNOSIS — R52 Pain, unspecified: Secondary | ICD-10-CM | POA: Diagnosis not present

## 2018-06-11 ENCOUNTER — Other Ambulatory Visit: Payer: Self-pay

## 2018-06-11 ENCOUNTER — Inpatient Hospital Stay (HOSPITAL_COMMUNITY): Payer: Medicare Other

## 2018-06-11 ENCOUNTER — Emergency Department (HOSPITAL_COMMUNITY): Payer: Medicare Other

## 2018-06-11 ENCOUNTER — Encounter (HOSPITAL_COMMUNITY): Payer: Self-pay | Admitting: Emergency Medicine

## 2018-06-11 ENCOUNTER — Inpatient Hospital Stay (HOSPITAL_COMMUNITY)
Admission: EM | Admit: 2018-06-11 | Discharge: 2018-06-24 | DRG: 871 | Disposition: A | Discharge status: Skilled Nursing Facility | Payer: Medicare Other | Attending: Internal Medicine | Admitting: Internal Medicine

## 2018-06-11 DIAGNOSIS — T402X5A Adverse effect of other opioids, initial encounter: Secondary | ICD-10-CM | POA: Diagnosis present

## 2018-06-11 DIAGNOSIS — N179 Acute kidney failure, unspecified: Secondary | ICD-10-CM | POA: Diagnosis not present

## 2018-06-11 DIAGNOSIS — R159 Full incontinence of feces: Secondary | ICD-10-CM | POA: Diagnosis present

## 2018-06-11 DIAGNOSIS — K7031 Alcoholic cirrhosis of liver with ascites: Secondary | ICD-10-CM | POA: Diagnosis not present

## 2018-06-11 DIAGNOSIS — R0902 Hypoxemia: Secondary | ICD-10-CM | POA: Diagnosis not present

## 2018-06-11 DIAGNOSIS — I69391 Dysphagia following cerebral infarction: Secondary | ICD-10-CM | POA: Diagnosis not present

## 2018-06-11 DIAGNOSIS — Y92009 Unspecified place in unspecified non-institutional (private) residence as the place of occurrence of the external cause: Secondary | ICD-10-CM

## 2018-06-11 DIAGNOSIS — R188 Other ascites: Secondary | ICD-10-CM | POA: Diagnosis present

## 2018-06-11 DIAGNOSIS — K6289 Other specified diseases of anus and rectum: Secondary | ICD-10-CM | POA: Diagnosis not present

## 2018-06-11 DIAGNOSIS — K859 Acute pancreatitis without necrosis or infection, unspecified: Secondary | ICD-10-CM | POA: Diagnosis present

## 2018-06-11 DIAGNOSIS — G894 Chronic pain syndrome: Secondary | ICD-10-CM | POA: Diagnosis not present

## 2018-06-11 DIAGNOSIS — G43909 Migraine, unspecified, not intractable, without status migrainosus: Secondary | ICD-10-CM | POA: Diagnosis present

## 2018-06-11 DIAGNOSIS — J9600 Acute respiratory failure, unspecified whether with hypoxia or hypercapnia: Secondary | ICD-10-CM | POA: Diagnosis present

## 2018-06-11 DIAGNOSIS — K219 Gastro-esophageal reflux disease without esophagitis: Secondary | ICD-10-CM | POA: Diagnosis present

## 2018-06-11 DIAGNOSIS — E669 Obesity, unspecified: Secondary | ICD-10-CM | POA: Diagnosis present

## 2018-06-11 DIAGNOSIS — E876 Hypokalemia: Secondary | ICD-10-CM | POA: Diagnosis not present

## 2018-06-11 DIAGNOSIS — R1084 Generalized abdominal pain: Secondary | ICD-10-CM | POA: Diagnosis not present

## 2018-06-11 DIAGNOSIS — R41844 Frontal lobe and executive function deficit: Secondary | ICD-10-CM | POA: Diagnosis not present

## 2018-06-11 DIAGNOSIS — J9601 Acute respiratory failure with hypoxia: Secondary | ICD-10-CM | POA: Diagnosis not present

## 2018-06-11 DIAGNOSIS — R627 Adult failure to thrive: Secondary | ICD-10-CM | POA: Diagnosis present

## 2018-06-11 DIAGNOSIS — R531 Weakness: Secondary | ICD-10-CM | POA: Diagnosis not present

## 2018-06-11 DIAGNOSIS — E2740 Unspecified adrenocortical insufficiency: Secondary | ICD-10-CM | POA: Diagnosis present

## 2018-06-11 DIAGNOSIS — Z8673 Personal history of transient ischemic attack (TIA), and cerebral infarction without residual deficits: Secondary | ICD-10-CM

## 2018-06-11 DIAGNOSIS — K5100 Ulcerative (chronic) pancolitis without complications: Secondary | ICD-10-CM | POA: Diagnosis present

## 2018-06-11 DIAGNOSIS — E039 Hypothyroidism, unspecified: Secondary | ICD-10-CM | POA: Diagnosis present

## 2018-06-11 DIAGNOSIS — F17210 Nicotine dependence, cigarettes, uncomplicated: Secondary | ICD-10-CM | POA: Diagnosis present

## 2018-06-11 DIAGNOSIS — A419 Sepsis, unspecified organism: Principal | ICD-10-CM

## 2018-06-11 DIAGNOSIS — K7460 Unspecified cirrhosis of liver: Secondary | ICD-10-CM

## 2018-06-11 DIAGNOSIS — I248 Other forms of acute ischemic heart disease: Secondary | ICD-10-CM | POA: Diagnosis present

## 2018-06-11 DIAGNOSIS — F419 Anxiety disorder, unspecified: Secondary | ICD-10-CM | POA: Diagnosis present

## 2018-06-11 DIAGNOSIS — Z79899 Other long term (current) drug therapy: Secondary | ICD-10-CM

## 2018-06-11 DIAGNOSIS — K760 Fatty (change of) liver, not elsewhere classified: Secondary | ICD-10-CM | POA: Diagnosis present

## 2018-06-11 DIAGNOSIS — I745 Embolism and thrombosis of iliac artery: Secondary | ICD-10-CM | POA: Diagnosis present

## 2018-06-11 DIAGNOSIS — G9341 Metabolic encephalopathy: Secondary | ICD-10-CM | POA: Diagnosis not present

## 2018-06-11 DIAGNOSIS — R41840 Attention and concentration deficit: Secondary | ICD-10-CM | POA: Diagnosis not present

## 2018-06-11 DIAGNOSIS — I10 Essential (primary) hypertension: Secondary | ICD-10-CM | POA: Diagnosis present

## 2018-06-11 DIAGNOSIS — Z79891 Long term (current) use of opiate analgesic: Secondary | ICD-10-CM

## 2018-06-11 DIAGNOSIS — I700 Atherosclerosis of aorta: Secondary | ICD-10-CM | POA: Diagnosis present

## 2018-06-11 DIAGNOSIS — Z9114 Patient's other noncompliance with medication regimen: Secondary | ICD-10-CM

## 2018-06-11 DIAGNOSIS — Z825 Family history of asthma and other chronic lower respiratory diseases: Secondary | ICD-10-CM

## 2018-06-11 DIAGNOSIS — R933 Abnormal findings on diagnostic imaging of other parts of digestive tract: Secondary | ICD-10-CM | POA: Diagnosis not present

## 2018-06-11 DIAGNOSIS — Z6837 Body mass index (BMI) 37.0-37.9, adult: Secondary | ICD-10-CM

## 2018-06-11 DIAGNOSIS — F411 Generalized anxiety disorder: Secondary | ICD-10-CM | POA: Diagnosis not present

## 2018-06-11 DIAGNOSIS — D696 Thrombocytopenia, unspecified: Secondary | ICD-10-CM | POA: Diagnosis not present

## 2018-06-11 DIAGNOSIS — R6520 Severe sepsis without septic shock: Secondary | ICD-10-CM | POA: Diagnosis not present

## 2018-06-11 DIAGNOSIS — Z716 Tobacco abuse counseling: Secondary | ICD-10-CM

## 2018-06-11 DIAGNOSIS — K529 Noninfective gastroenteritis and colitis, unspecified: Secondary | ICD-10-CM | POA: Diagnosis not present

## 2018-06-11 DIAGNOSIS — Z720 Tobacco use: Secondary | ICD-10-CM | POA: Diagnosis not present

## 2018-06-11 DIAGNOSIS — G4730 Sleep apnea, unspecified: Secondary | ICD-10-CM | POA: Diagnosis present

## 2018-06-11 DIAGNOSIS — Z880 Allergy status to penicillin: Secondary | ICD-10-CM

## 2018-06-11 DIAGNOSIS — F1910 Other psychoactive substance abuse, uncomplicated: Secondary | ICD-10-CM | POA: Diagnosis present

## 2018-06-11 DIAGNOSIS — J9811 Atelectasis: Secondary | ICD-10-CM | POA: Diagnosis not present

## 2018-06-11 DIAGNOSIS — K6389 Other specified diseases of intestine: Secondary | ICD-10-CM | POA: Diagnosis not present

## 2018-06-11 DIAGNOSIS — E46 Unspecified protein-calorie malnutrition: Secondary | ICD-10-CM | POA: Diagnosis not present

## 2018-06-11 DIAGNOSIS — Z9049 Acquired absence of other specified parts of digestive tract: Secondary | ICD-10-CM

## 2018-06-11 DIAGNOSIS — Z79890 Hormone replacement therapy (postmenopausal): Secondary | ICD-10-CM

## 2018-06-11 DIAGNOSIS — Z90710 Acquired absence of both cervix and uterus: Secondary | ICD-10-CM

## 2018-06-11 DIAGNOSIS — E872 Acidosis: Secondary | ICD-10-CM | POA: Diagnosis present

## 2018-06-11 DIAGNOSIS — E871 Hypo-osmolality and hyponatremia: Secondary | ICD-10-CM | POA: Diagnosis present

## 2018-06-11 DIAGNOSIS — D539 Nutritional anemia, unspecified: Secondary | ICD-10-CM | POA: Diagnosis not present

## 2018-06-11 DIAGNOSIS — R0689 Other abnormalities of breathing: Secondary | ICD-10-CM | POA: Diagnosis not present

## 2018-06-11 DIAGNOSIS — L8990 Pressure ulcer of unspecified site, unspecified stage: Secondary | ICD-10-CM | POA: Diagnosis present

## 2018-06-11 DIAGNOSIS — R739 Hyperglycemia, unspecified: Secondary | ICD-10-CM | POA: Diagnosis not present

## 2018-06-11 DIAGNOSIS — R21 Rash and other nonspecific skin eruption: Secondary | ICD-10-CM | POA: Diagnosis present

## 2018-06-11 DIAGNOSIS — E861 Hypovolemia: Secondary | ICD-10-CM | POA: Diagnosis present

## 2018-06-11 DIAGNOSIS — I639 Cerebral infarction, unspecified: Secondary | ICD-10-CM | POA: Diagnosis not present

## 2018-06-11 DIAGNOSIS — K7030 Alcoholic cirrhosis of liver without ascites: Secondary | ICD-10-CM | POA: Diagnosis not present

## 2018-06-11 DIAGNOSIS — Z781 Physical restraint status: Secondary | ICD-10-CM

## 2018-06-11 DIAGNOSIS — N139 Obstructive and reflux uropathy, unspecified: Secondary | ICD-10-CM | POA: Diagnosis not present

## 2018-06-11 DIAGNOSIS — I9589 Other hypotension: Secondary | ICD-10-CM | POA: Diagnosis present

## 2018-06-11 DIAGNOSIS — I959 Hypotension, unspecified: Secondary | ICD-10-CM | POA: Diagnosis not present

## 2018-06-11 DIAGNOSIS — R1311 Dysphagia, oral phase: Secondary | ICD-10-CM | POA: Diagnosis not present

## 2018-06-11 DIAGNOSIS — Z743 Need for continuous supervision: Secondary | ICD-10-CM | POA: Diagnosis not present

## 2018-06-11 DIAGNOSIS — R279 Unspecified lack of coordination: Secondary | ICD-10-CM | POA: Diagnosis not present

## 2018-06-11 DIAGNOSIS — Z7982 Long term (current) use of aspirin: Secondary | ICD-10-CM

## 2018-06-11 DIAGNOSIS — R339 Retention of urine, unspecified: Secondary | ICD-10-CM | POA: Diagnosis present

## 2018-06-11 DIAGNOSIS — E538 Deficiency of other specified B group vitamins: Secondary | ICD-10-CM | POA: Diagnosis present

## 2018-06-11 DIAGNOSIS — E559 Vitamin D deficiency, unspecified: Secondary | ICD-10-CM | POA: Diagnosis not present

## 2018-06-11 DIAGNOSIS — M6281 Muscle weakness (generalized): Secondary | ICD-10-CM | POA: Diagnosis not present

## 2018-06-11 DIAGNOSIS — D649 Anemia, unspecified: Secondary | ICD-10-CM | POA: Diagnosis not present

## 2018-06-11 DIAGNOSIS — N816 Rectocele: Secondary | ICD-10-CM | POA: Diagnosis present

## 2018-06-11 DIAGNOSIS — Z4682 Encounter for fitting and adjustment of non-vascular catheter: Secondary | ICD-10-CM | POA: Diagnosis not present

## 2018-06-11 DIAGNOSIS — R140 Abdominal distension (gaseous): Secondary | ICD-10-CM | POA: Diagnosis not present

## 2018-06-11 DIAGNOSIS — R4182 Altered mental status, unspecified: Secondary | ICD-10-CM | POA: Diagnosis not present

## 2018-06-11 DIAGNOSIS — J069 Acute upper respiratory infection, unspecified: Secondary | ICD-10-CM | POA: Diagnosis not present

## 2018-06-11 DIAGNOSIS — Z01818 Encounter for other preprocedural examination: Secondary | ICD-10-CM

## 2018-06-11 DIAGNOSIS — E1165 Type 2 diabetes mellitus with hyperglycemia: Secondary | ICD-10-CM | POA: Diagnosis not present

## 2018-06-11 DIAGNOSIS — D509 Iron deficiency anemia, unspecified: Secondary | ICD-10-CM | POA: Diagnosis present

## 2018-06-11 DIAGNOSIS — R197 Diarrhea, unspecified: Secondary | ICD-10-CM | POA: Diagnosis not present

## 2018-06-11 DIAGNOSIS — Z882 Allergy status to sulfonamides status: Secondary | ICD-10-CM

## 2018-06-11 LAB — CBC WITH DIFFERENTIAL/PLATELET
ABS IMMATURE GRANULOCYTES: 0.1 10*3/uL (ref 0.0–0.1)
BASOS ABS: 0 10*3/uL (ref 0.0–0.1)
BASOS PCT: 0 %
Basophils Absolute: 0 10*3/uL (ref 0.0–0.1)
Basophils Relative: 0 %
EOS PCT: 0 %
Eosinophils Absolute: 0 10*3/uL (ref 0.0–0.7)
Eosinophils Absolute: 0.1 10*3/uL (ref 0.0–0.7)
Eosinophils Relative: 1 %
HCT: 29.9 % — ABNORMAL LOW (ref 36.0–46.0)
HCT: 27.1 % — ABNORMAL LOW (ref 36.0–46.0)
HEMOGLOBIN: 9.9 g/dL — AB (ref 12.0–15.0)
Hemoglobin: 10.8 g/dL — ABNORMAL LOW (ref 12.0–15.0)
IMMATURE GRANULOCYTES: 1 %
LYMPHS PCT: 24 %
Lymphocytes Relative: 18 %
Lymphs Abs: 1.5 10*3/uL (ref 0.7–4.0)
Lymphs Abs: 2.8 10*3/uL (ref 0.7–4.0)
MCH: 36.9 pg — ABNORMAL HIGH (ref 26.0–34.0)
MCH: 36.9 pg — AB (ref 26.0–34.0)
MCHC: 36.1 g/dL — ABNORMAL HIGH (ref 30.0–36.0)
MCHC: 36.5 g/dL — ABNORMAL HIGH (ref 30.0–36.0)
MCV: 102 fL — ABNORMAL HIGH (ref 78.0–100.0)
MCV: 101.1 fL — ABNORMAL HIGH (ref 78.0–100.0)
MONO ABS: 0.2 10*3/uL (ref 0.1–1.0)
MONO ABS: 0.5 10*3/uL (ref 0.1–1.0)
Monocytes Relative: 3 %
Monocytes Relative: 3 %
NEUTROS ABS: 11.7 10*3/uL — AB (ref 1.7–7.7)
NEUTROS PCT: 77 %
Neutro Abs: 4.6 10*3/uL (ref 1.7–7.7)
Neutrophils Relative %: 73 %
PLATELETS: 78 10*3/uL — AB (ref 150–400)
PLATELETS: 90 10*3/uL — AB (ref 150–400)
RBC: 2.93 MIL/uL — ABNORMAL LOW (ref 3.87–5.11)
RBC: 2.68 MIL/uL — AB (ref 3.87–5.11)
RDW: 14.7 % (ref 11.5–15.5)
RDW: 14.1 % (ref 11.5–15.5)
WBC: 6.3 10*3/uL (ref 4.0–10.5)
WBC: 15.2 10*3/uL — AB (ref 4.0–10.5)

## 2018-06-11 LAB — COMPREHENSIVE METABOLIC PANEL
ALT: 11 U/L — ABNORMAL LOW (ref 14–54)
ALT: 13 U/L — AB (ref 14–54)
ANION GAP: 9 (ref 5–15)
AST: 19 U/L (ref 15–41)
AST: 23 U/L (ref 15–41)
Albumin: 2 g/dL — ABNORMAL LOW (ref 3.5–5.0)
Albumin: 2.6 g/dL — ABNORMAL LOW (ref 3.5–5.0)
Alkaline Phosphatase: 66 U/L (ref 38–126)
Alkaline Phosphatase: 87 U/L (ref 38–126)
Anion gap: 10 (ref 5–15)
BILIRUBIN TOTAL: 0.7 mg/dL (ref 0.3–1.2)
BUN: 8 mg/dL (ref 6–20)
BUN: 6 mg/dL (ref 6–20)
CALCIUM: 7.4 mg/dL — AB (ref 8.9–10.3)
CO2: 21 mmol/L — ABNORMAL LOW (ref 22–32)
CO2: 23 mmol/L (ref 22–32)
CREATININE: 0.97 mg/dL (ref 0.44–1.00)
Calcium: 5.3 mg/dL — CL (ref 8.9–10.3)
Chloride: 106 mmol/L (ref 101–111)
Chloride: 98 mmol/L — ABNORMAL LOW (ref 101–111)
Creatinine, Ser: 0.75 mg/dL (ref 0.44–1.00)
GFR calc Af Amer: >60 mL/min (ref >60)
GFR calc non Af Amer: >60 mL/min (ref >60)
GLUCOSE: 324 mg/dL — AB (ref 65–99)
Glucose, Bld: 130 mg/dL — ABNORMAL HIGH (ref 65–99)
Potassium: <2 mmol/L — CL (ref 3.5–5.1)
Potassium: <2 mmol/L — CL (ref 3.5–5.1)
Sodium: 136 mmol/L (ref 135–145)
Sodium: 131 mmol/L — ABNORMAL LOW (ref 135–145)
TOTAL PROTEIN: 4 g/dL — AB (ref 6.5–8.1)
Total Bilirubin: 0.8 mg/dL (ref 0.3–1.2)
Total Protein: 5.3 g/dL — ABNORMAL LOW (ref 6.5–8.1)

## 2018-06-11 LAB — POCT I-STAT 3, ART BLOOD GAS (G3+)
ACID-BASE DEFICIT: 2 mmol/L (ref 0.0–2.0)
BICARBONATE: 22.1 mmol/L (ref 20.0–28.0)
O2 Saturation: 100 %
PCO2 ART: 32.6 mmHg (ref 32.0–48.0)
PO2 ART: 191 mmHg — AB (ref 83.0–108.0)
Patient temperature: 36.4
TCO2: 23 mmol/L (ref 22–32)
pH, Arterial: 7.437 (ref 7.350–7.450)

## 2018-06-11 LAB — ACETAMINOPHEN LEVEL: Acetaminophen (Tylenol), Serum: <10 ug/mL — ABNORMAL LOW (ref 10–30)

## 2018-06-11 LAB — BLOOD GAS, ARTERIAL
ACID-BASE EXCESS: 5.7 mmol/L — AB (ref 0.0–2.0)
BICARBONATE: 30.1 mmol/L — AB (ref 20.0–28.0)
Drawn by: 221791
FIO2: 21
O2 Saturation: 98.3 %
PATIENT TEMPERATURE: 37
pCO2 arterial: 30.6 mmHg — ABNORMAL LOW (ref 32.0–48.0)
pH, Arterial: 7.571 — ABNORMAL HIGH (ref 7.350–7.450)
pO2, Arterial: 99.1 mmHg (ref 83.0–108.0)

## 2018-06-11 LAB — TRIGLYCERIDES: Triglycerides: 115 mg/dL (ref <150)

## 2018-06-11 LAB — URINALYSIS, ROUTINE W REFLEX MICROSCOPIC
BILIRUBIN URINE: NEGATIVE
Glucose, UA: NEGATIVE mg/dL
Ketones, ur: NEGATIVE mg/dL
LEUKOCYTES UA: NEGATIVE
Nitrite: NEGATIVE
PROTEIN: NEGATIVE mg/dL
Specific Gravity, Urine: 1.009 (ref 1.005–1.030)
pH: 7 (ref 5.0–8.0)

## 2018-06-11 LAB — MRSA PCR SCREENING: MRSA by PCR: NEGATIVE

## 2018-06-11 LAB — TROPONIN I
TROPONIN I: 0.08 ng/mL — AB (ref <0.03)
Troponin I: 1.17 ng/mL (ref <0.03)

## 2018-06-11 LAB — PHOSPHORUS

## 2018-06-11 LAB — RAPID URINE DRUG SCREEN, HOSP PERFORMED
AMPHETAMINES: NOT DETECTED
Benzodiazepines: POSITIVE — AB
Cocaine: NOT DETECTED
Opiates: POSITIVE — AB
TETRAHYDROCANNABINOL: POSITIVE — AB

## 2018-06-11 LAB — I-STAT CG4 LACTIC ACID, ED: LACTIC ACID, VENOUS: 4.58 mmol/L — AB (ref 0.5–1.9)

## 2018-06-11 LAB — TYPE AND SCREEN
ABO/RH(D): B POS
ABO/RH(D): B POS
ANTIBODY SCREEN: NEGATIVE
Antibody Screen: NEGATIVE

## 2018-06-11 LAB — GLUCOSE, CAPILLARY
GLUCOSE-CAPILLARY: 243 mg/dL — AB (ref 65–99)
GLUCOSE-CAPILLARY: 248 mg/dL — AB (ref 65–99)
Glucose-Capillary: 197 mg/dL — ABNORMAL HIGH (ref 65–99)

## 2018-06-11 LAB — ETHANOL

## 2018-06-11 LAB — LIPASE, BLOOD: Lipase: 30 U/L (ref 11–51)

## 2018-06-11 LAB — LACTIC ACID, PLASMA
Lactic Acid, Venous: 3 mmol/L (ref 0.5–1.9)
Lactic Acid, Venous: 2.3 mmol/L (ref 0.5–1.9)

## 2018-06-11 LAB — PROTIME-INR
INR: 1.22
PROTHROMBIN TIME: 15.3 s — AB (ref 11.4–15.2)

## 2018-06-11 LAB — CBG MONITORING, ED: Glucose-Capillary: 180 mg/dL — ABNORMAL HIGH (ref 65–99)

## 2018-06-11 LAB — MAGNESIUM
Magnesium: 1.3 mg/dL — ABNORMAL LOW (ref 1.7–2.4)
Magnesium: 2 mg/dL (ref 1.7–2.4)

## 2018-06-11 LAB — SALICYLATE LEVEL

## 2018-06-11 LAB — BRAIN NATRIURETIC PEPTIDE: B NATRIURETIC PEPTIDE 5: 226.2 pg/mL — AB (ref 0.0–100.0)

## 2018-06-11 MED ORDER — SODIUM CHLORIDE 0.9 % IV SOLN
INTRAVENOUS | Status: DC
  Administered 2018-06-11: 23:00:00 via INTRAVENOUS

## 2018-06-11 MED ORDER — PIPERACILLIN-TAZOBACTAM 3.375 G IVPB
3.3750 g | Freq: Three times a day (TID) | INTRAVENOUS | Status: DC
  Administered 2018-06-11 – 2018-06-15 (×11): 3.375 g via INTRAVENOUS
  Filled 2018-06-11 (×12): qty 50

## 2018-06-11 MED ORDER — ASPIRIN 300 MG RE SUPP
300.0000 mg | RECTAL | Status: AC
  Administered 2018-06-11: 300 mg via RECTAL
  Filled 2018-06-11: qty 1

## 2018-06-11 MED ORDER — ASPIRIN 81 MG PO CHEW: 324.0000 mg | CHEWABLE_TABLET | ORAL | Status: AC

## 2018-06-11 MED ORDER — SODIUM PHOSPHATES 45 MMOLE/15ML IV SOLN
30.0000 mmol | Freq: Once | INTRAVENOUS | Status: AC
  Administered 2018-06-11: 30 mmol via INTRAVENOUS
  Filled 2018-06-11: qty 10

## 2018-06-11 MED ORDER — SUCCINYLCHOLINE CHLORIDE 20 MG/ML IJ SOLN
120.0000 mg | Freq: Once | INTRAMUSCULAR | Status: AC
  Administered 2018-06-11: 20 mg via INTRAVENOUS

## 2018-06-11 MED ORDER — SODIUM CHLORIDE 0.9 % IV SOLN
1.0000 g | Freq: Once | INTRAVENOUS | Status: AC
  Administered 2018-06-11: 1 g via INTRAVENOUS
  Filled 2018-06-11: qty 10

## 2018-06-11 MED ORDER — DEXMEDETOMIDINE HCL IN NACL 200 MCG/50ML IV SOLN
INTRAVENOUS | Status: AC
  Administered 2018-06-11: 0.4 ug/kg/h via INTRAVENOUS
  Filled 2018-06-11: qty 50

## 2018-06-11 MED ORDER — MAGNESIUM SULFATE 2 GM/50ML IV SOLN
2.0000 g | Freq: Once | INTRAVENOUS | Status: AC
Start: 2018-06-11 — End: 2018-06-11
  Administered 2018-06-11: 2 g via INTRAVENOUS
  Filled 2018-06-11: qty 50

## 2018-06-11 MED ORDER — SODIUM CHLORIDE 0.9 % IV SOLN
Freq: Once | INTRAVENOUS | Status: AC
Start: 2018-06-11 — End: 2018-06-11
  Administered 2018-06-11: 17:00:00 via INTRAVENOUS

## 2018-06-11 MED ORDER — ORAL CARE MOUTH RINSE
15.0000 mL | OROMUCOSAL | Status: DC
  Administered 2018-06-11 – 2018-06-13 (×15): 15 mL via OROMUCOSAL

## 2018-06-11 MED ORDER — LIDOCAINE-EPINEPHRINE (PF) 2 %-1:200000 IJ SOLN
INTRAMUSCULAR | Status: AC
  Administered 2018-06-11: 15:00:00
  Filled 2018-06-11: qty 20

## 2018-06-11 MED ORDER — CHLORHEXIDINE GLUCONATE 0.12% ORAL RINSE (MEDLINE KIT)
15.0000 mL | Freq: Two times a day (BID) | OROMUCOSAL | Status: DC
  Administered 2018-06-11 – 2018-06-12 (×3): 15 mL via OROMUCOSAL

## 2018-06-11 MED ORDER — FENTANYL CITRATE (PF) 100 MCG/2ML IJ SOLN
25.0000 ug | INTRAMUSCULAR | Status: DC | PRN
  Administered 2018-06-12 – 2018-06-14 (×11): 25 ug via INTRAVENOUS
  Filled 2018-06-11 (×11): qty 2

## 2018-06-11 MED ORDER — NOREPINEPHRINE 16 MG/250ML-% IV SOLN
0.0000 ug/min | INTRAVENOUS | Status: DC
  Administered 2018-06-11: 35 ug/min via INTRAVENOUS
  Administered 2018-06-12: 32 ug/min via INTRAVENOUS
  Administered 2018-06-12: 25 ug/min via INTRAVENOUS
  Administered 2018-06-12: 40 ug/min via INTRAVENOUS
  Administered 2018-06-13: 2 ug/min via INTRAVENOUS
  Filled 2018-06-11 (×4): qty 250
  Filled 2018-06-11: qty 16
  Filled 2018-06-11: qty 250

## 2018-06-11 MED ORDER — SODIUM CHLORIDE 0.9 % IV SOLN
250.0000 mL | INTRAVENOUS | Status: DC | PRN
  Administered 2018-06-15 – 2018-06-21 (×2): 250 mL via INTRAVENOUS

## 2018-06-11 MED ORDER — SODIUM CHLORIDE 0.9 % IV BOLUS (SEPSIS)
1000.0000 mL | Freq: Once | INTRAVENOUS | Status: AC
  Administered 2018-06-11: 1000 mL via INTRAVENOUS

## 2018-06-11 MED ORDER — HEPARIN SODIUM (PORCINE) 5000 UNIT/ML IJ SOLN
5000.0000 [IU] | Freq: Three times a day (TID) | INTRAMUSCULAR | Status: DC
  Administered 2018-06-11 – 2018-06-18 (×20): 5000 [IU] via SUBCUTANEOUS
  Filled 2018-06-11 (×21): qty 1

## 2018-06-11 MED ORDER — LACTATED RINGERS IV SOLN
INTRAVENOUS | Status: DC
  Administered 2018-06-11: 20:00:00 via INTRAVENOUS

## 2018-06-11 MED ORDER — POTASSIUM CHLORIDE 10 MEQ/100ML IV SOLN
10.0000 meq | INTRAVENOUS | Status: AC
  Administered 2018-06-11 (×3): 10 meq via INTRAVENOUS
  Filled 2018-06-11 (×2): qty 100

## 2018-06-11 MED ORDER — PROPOFOL 1000 MG/100ML IV EMUL
5.0000 ug/kg/min | INTRAVENOUS | Status: DC
  Administered 2018-06-11: 5 ug/kg/min via INTRAVENOUS
  Filled 2018-06-11: qty 100

## 2018-06-11 MED ORDER — ETOMIDATE 2 MG/ML IV SOLN
10.0000 mg | Freq: Once | INTRAVENOUS | Status: AC
  Administered 2018-06-11: 7.5 mg via INTRAVENOUS

## 2018-06-11 MED ORDER — NOREPINEPHRINE 4 MG/250ML-% IV SOLN
INTRAVENOUS | Status: AC
  Administered 2018-06-11: 3.733 ug/min via INTRAVENOUS
  Filled 2018-06-11: qty 250

## 2018-06-11 MED ORDER — VANCOMYCIN HCL IN DEXTROSE 1-5 GM/200ML-% IV SOLN: 1000.0000 mg | Freq: Once | INTRAVENOUS | Status: DC

## 2018-06-11 MED ORDER — POTASSIUM CHLORIDE 10 MEQ/100ML IV SOLN
INTRAVENOUS | Status: AC
  Administered 2018-06-11: 10 meq via INTRAVENOUS
  Filled 2018-06-11: qty 100

## 2018-06-11 MED ORDER — NOREPINEPHRINE 4 MG/250ML-% IV SOLN
0.0000 ug/min | INTRAVENOUS | Status: DC
  Administered 2018-06-11: 3.733 ug/min via INTRAVENOUS
  Administered 2018-06-11: 20 ug/min via INTRAVENOUS
  Administered 2018-06-11: 25 ug/min via INTRAVENOUS
  Filled 2018-06-11 (×2): qty 250

## 2018-06-11 MED ORDER — VANCOMYCIN HCL IN DEXTROSE 1-5 GM/200ML-% IV SOLN
1000.0000 mg | Freq: Two times a day (BID) | INTRAVENOUS | Status: DC
  Administered 2018-06-11 – 2018-06-12 (×3): 1000 mg via INTRAVENOUS
  Filled 2018-06-11 (×4): qty 200

## 2018-06-11 MED ORDER — SODIUM CHLORIDE 0.9 % IV BOLUS (SEPSIS)
500.0000 mL | Freq: Once | INTRAVENOUS | Status: AC
Start: 2018-06-11 — End: 2018-06-11
  Administered 2018-06-11: 1000 mL via INTRAVENOUS

## 2018-06-11 MED ORDER — VANCOMYCIN HCL 10 G IV SOLR
1500.0000 mg | Freq: Once | INTRAVENOUS | Status: AC
  Administered 2018-06-11: 1500 mg via INTRAVENOUS
  Filled 2018-06-11: qty 1500

## 2018-06-11 MED ORDER — POTASSIUM CHLORIDE 10 MEQ/100ML IV SOLN
10.0000 meq | INTRAVENOUS | Status: AC
  Administered 2018-06-11 – 2018-06-12 (×5): 10 meq via INTRAVENOUS
  Filled 2018-06-11 (×5): qty 100

## 2018-06-11 MED ORDER — FAMOTIDINE IN NACL 20-0.9 MG/50ML-% IV SOLN
20.0000 mg | Freq: Two times a day (BID) | INTRAVENOUS | Status: DC
  Administered 2018-06-11 – 2018-06-12 (×2): 20 mg via INTRAVENOUS
  Filled 2018-06-11 (×2): qty 50

## 2018-06-11 MED ORDER — IOPAMIDOL (ISOVUE-300) INJECTION 61%
100.0000 mL | Freq: Once | INTRAVENOUS | Status: AC | PRN
  Administered 2018-06-11: 100 mL via INTRAVENOUS

## 2018-06-11 MED ORDER — SODIUM CHLORIDE 0.9 % IV SOLN
Freq: Once | INTRAVENOUS | Status: AC
  Administered 2018-06-11: 16:00:00 via INTRAVENOUS

## 2018-06-11 MED ORDER — DEXMEDETOMIDINE HCL IN NACL 200 MCG/50ML IV SOLN
0.4000 ug/kg/h | INTRAVENOUS | Status: DC
  Administered 2018-06-11: 0.4 ug/kg/h via INTRAVENOUS
  Administered 2018-06-11: 0.5 ug/kg/h via INTRAVENOUS
  Administered 2018-06-11: 1 ug/kg/h via INTRAVENOUS
  Administered 2018-06-11: 1.2 ug/kg/h via INTRAVENOUS
  Filled 2018-06-11: qty 100

## 2018-06-11 MED ORDER — FOLIC ACID 5 MG/ML IJ SOLN
1.0000 mg | Freq: Every day | INTRAMUSCULAR | Status: DC
  Administered 2018-06-11 – 2018-06-12 (×2): 1 mg via INTRAVENOUS
  Filled 2018-06-11 (×3): qty 0.2

## 2018-06-11 MED ORDER — CALCIUM GLUCONATE 10 % IV SOLN
1.0000 g | Freq: Once | INTRAVENOUS | Status: AC
  Administered 2018-06-11: 1 g via INTRAVENOUS
  Filled 2018-06-11: qty 10

## 2018-06-11 MED ORDER — NOREPINEPHRINE BITARTRATE 1 MG/ML IV SOLN: 4.0000 mg | Freq: Once | INTRAVENOUS | Status: DC

## 2018-06-11 MED ORDER — DEXMEDETOMIDINE BOLUS VIA INFUSION
1.0000 ug/kg | Freq: Once | INTRAVENOUS | Status: AC
  Administered 2018-06-11: 77.1 ug via INTRAVENOUS
  Filled 2018-06-11: qty 78

## 2018-06-11 MED ORDER — PIPERACILLIN-TAZOBACTAM 3.375 G IVPB 30 MIN
3.3750 g | Freq: Once | INTRAVENOUS | Status: AC
  Administered 2018-06-11: 3.375 g via INTRAVENOUS
  Filled 2018-06-11: qty 50

## 2018-06-11 MED ORDER — THIAMINE HCL 100 MG/ML IJ SOLN
100.0000 mg | Freq: Every day | INTRAMUSCULAR | Status: DC
  Administered 2018-06-11 – 2018-06-12 (×2): 100 mg via INTRAVENOUS
  Filled 2018-06-11 (×2): qty 2

## 2018-06-11 NOTE — Progress Notes (Signed)
CRITICAL VALUE ALERT  Critical Value:  Lactic acid 2.3, BNP=226.2, K+=<2, Ca=7.4, NA=131  Date & Time Notied:  06/11/2018 2223  Provider Notified: Warren Lacy  Orders Received/Actions taken: Awaiting new orders

## 2018-06-11 NOTE — Progress Notes (Signed)
Patient arrived on the unit on ventilator with precedex and levophed gtt. Bp 70/58, HR 56, sats 100% and RR 18.

## 2018-06-11 NOTE — ED Notes (Signed)
From CT

## 2018-06-11 NOTE — ED Notes (Signed)
Amaryllis Dyke (best friend) [phone]

## 2018-06-11 NOTE — Progress Notes (Signed)
Endotracheal tube withdrew to 23 cm H20.

## 2018-06-11 NOTE — Sedation Documentation (Signed)
[facility] [employer] paramedic student assisting Dr. Jeanell Sparrow with intubation.

## 2018-06-11 NOTE — ED Notes (Signed)
CRITICAL VALUE ALERT  Critical Value:  Lactic 3.0  Date & Time Notied: 06/11/2018, 5525  Notified Charge RN, Barbaraann Rondo, at [facility].  She said she will notify MD.   Provider Notified:   Orders Received/Actions taken: Charge RN will notify  MD.

## 2018-06-11 NOTE — ED Notes (Signed)
Partial report Lurena Joiner, Therapist, sports, [employer] 

## 2018-06-11 NOTE — Progress Notes (Signed)
Pharmacy Antibiotic Note  Deborah Russo is a 57 y.o. female admitted on 06/11/2018 with sepsis.  Pharmacy has been consulted for Vancomycin and Zosyn  dosing.  Plan: Give vancomycin loading dose of 1.5g x1 dose, then start vancomycin 1g IV q12h. Give Zosyn 3.375g IV x1 dose over 30 min, then start Zosyn 3.375g IV q8h (4-hr inf) Every other day CBC and BMET ordered. Pharmacy will continue to monitor patient's progress, cultures and levels as appropriate based on intended length of therapy.       Height: 5' 2"  (157.5 cm) Weight: 170 lb (77.1 kg) IBW/kg (Calculated) : 50.1  Temp (24hrs), Avg:96.9 F (36.1 C), Min:94.8 F (34.9 C), Max:98.5 F (36.9 C)  Recent Labs  Lab 06/11/18 1141 06/11/18 1212 06/11/18 1238  WBC 6.3  --   --   CREATININE  --   --  0.75  LATICACIDVEN  --  4.58*  --     Estimated Creatinine Clearance: 73.7 mL/min (by C-G formula based on SCr of 0.75 mg/dL).    Allergies  Allergen Reactions  . Penicillins Hives    Has patient had a PCN reaction causing immediate rash, facial/tongue/throat swelling, SOB or lightheadedness with hypotension: No Has patient had a PCN reaction causing severe rash involving mucus membranes or skin necrosis: No Has patient had a PCN reaction that required hospitalization No Has patient had a PCN reaction occurring within the last 10 years: No If all of the above answers are "NO", then may proceed with Cephalosporin use.   . Sulfa Antibiotics Hives    Antimicrobials this admission: 6/23 vancomycin >>  6/23 Zosyn >>  Dose adjustments this admission: n/a  Microbiology results: 6/23 BCx2: in progress 6/23 UCx:  In progress    Thank you for allowing pharmacy to be a part of this patient's care.  Despina Pole 06/11/2018 2:07 PM

## 2018-06-11 NOTE — Progress Notes (Signed)
CRITICAL VALUE ALERT  Critical Value: LA 3.0   Date & Time Notied:  06/11/18 1832  Provider Notified: Dr. Jackey Loge   Orders Received/Actions taken: no actions given at this time

## 2018-06-11 NOTE — ED Notes (Signed)
From home- sister moved out a week ago Called EMS for SOB, was sitting on front porch of filthy home per EMS  EMS IV with 500 cc NS

## 2018-06-11 NOTE — ED Notes (Addendum)
Kandice Moos (daughter) [facility] Nurses  [phone]  Driscilla Moats (sister) [phone]

## 2018-06-11 NOTE — ED Notes (Signed)
Pt is followed per her report at [facility] pain clinic  Requesting Oxy Complaining that she is cold  Blankets provided

## 2018-06-11 NOTE — ED Provider Notes (Addendum)
[facility] EMERGENCY DEPARTMENT Provider Note   CSN: [id number] Arrival date & time: 06/11/18  1112     History   Chief Complaint Chief Complaint  Patient presents with  . Weakness    HPI Deborah Russo is a 57 y.o. female. Level 5 caveat secondary to altered mental status and severity of illness HPI  57 year old female presented via EMS with altered mental status and hypotension.  She reports abdominal pain, vomiting, and diarrhea.  She is very drowsy and is difficult to get history.  There is report that her sister was living with her but has been gone for the past week.  Discharge summary from  1 year ago was reviewed when she presented with altered mental status.  There was diagnosed with acute toxic encephalopathy thought to be due to polypharmacy with unintentional overdose.  However it was also thought that she may have had a stroke at that time she refused further evaluation.  Past Medical History:  Diagnosis Date  . Chronic abdominal pain    since approximately 1991  . Chronic migraine   . Chronic pain   . Hypothyroidism   . Nausea, vomiting, and diarrhea    recurrent, chronic  . Pain management   . Sleep apnea    has CPAP, but doesn't wear it bc she says "i can't sleep with it on"  . Stroke [facility])     Patient Active Problem List   Diagnosis Date Noted  . Altered mental status 06/17/2017  . Polypharmacy   . Palliative care encounter   . Goals of care, counseling/discussion   . DNR (do not resuscitate) discussion   . Acute CVA (cerebrovascular accident) ([place]) 03/29/2017  . Tobacco abuse 03/29/2017  . HTN (hypertension) 03/29/2017  . Aortic atherosclerosis ([place]) 09/29/2016  . Chronic migraine 09/29/2016  . H/O Clostridium difficile infection 07/13/2014  . Diarrhea 07/13/2014  . FTT (failure to thrive) in adult 07/13/2014  . Abdominal pain, unspecified site 07/13/2014  . Chronic abdominal pain   . Abdominal pain 06/19/2014  . Hypothyroidism 06/19/2014   . Hypokalemia 06/19/2014  . Chronic pain 06/19/2014  . Anemia 06/19/2014  . Abscess of axilla, left 03/28/2013    Past Surgical History:  Procedure Laterality Date  . ABDOMINAL HYSTERECTOMY    . ABDOMINAL SURGERY    . BIOPSY N/A 07/17/2014   Procedure: BIOPSY;  Surgeon: Rogene Houston, MD;  Location: [facility];  Service: Endoscopy;  Laterality: N/A;  . BREAST SURGERY Left    removed nipple  . CESAREAN SECTION    . CHOLECYSTECTOMY    . ESOPHAGOGASTRODUODENOSCOPY N/A 07/17/2014   Procedure: ESOPHAGOGASTRODUODENOSCOPY (EGD);  Surgeon: Rogene Houston, MD;  Location: [facility]S;  Service: Endoscopy;  Laterality: N/A;     OB History    Gravida  2   Para  1   Term  1   Preterm      AB  1   Living        SAB      TAB  1   Ectopic      Multiple      Live Births               Home Medications    Prior to Admission medications   Medication Sig Start Date End Date Taking? Authorizing Provider  aspirin 325 MG tablet Take 1 tablet (325 mg total) by mouth daily. 04/02/17   Orvan Falconer, MD  diazepam (VALIUM) 5 MG tablet Take 0.5 tablets (2.5  mg total) by mouth 3 (three) times daily. 06/17/17   Janece Canterbury, MD  famotidine (PEPCID) 20 MG tablet Take 1 tablet (20 mg total) by mouth 2 (two) times daily. 04/27/17   Julianne Rice, MD  levothyroxine (SYNTHROID, LEVOTHROID) 200 MCG tablet TAKE 1 TABLET DAILY 09/07/17   Eustaquio Maize, MD  lisinopril (PRINIVIL,ZESTRIL) 10 MG tablet TAKE 1 TABLET DAILY 03/14/17   Eustaquio Maize, MD  omeprazole (PRILOSEC) 20 MG capsule TAKE 1 TABLET DAILY 02/08/17   Eustaquio Maize, MD  oxyCODONE (OXYCONTIN) 80 mg 12 hr tablet Take 1 tablet (80 mg total) by mouth every 12 (twelve) hours. 04/01/17   Orvan Falconer, MD  oxyCODONE-acetaminophen (PERCOCET) 10-325 MG tablet Take 1 tablet by mouth 2 (two) times daily.    [provider]  pantoprazole (PROTONIX) 20 MG tablet TAKE 1 TABLET ONCE DAILY 09/07/17   Eustaquio Maize, MD  pravastatin  (PRAVACHOL) 20 MG tablet TAKE 1 TABLET DAILY 03/14/17   Eustaquio Maize, MD  senna-docusate (SENOKOT-S) 8.6-50 MG tablet Take 1 tablet by mouth at bedtime as needed for mild constipation. 04/01/17   Orvan Falconer, MD  sucralfate (CARAFATE) 1 g tablet TAKE  (1)  TABLET  THREE TIMES DAILY AS NEEDED. Patient taking differently: TAKE  (1)  TABLET  THREE TIMES DAILY AS NEEDED FOR STOMACH PAIN 01/08/17   Eustaquio Maize, MD  SUMAtriptan (IMITREX) 100 MG tablet Take 100 mg by mouth daily as needed for migraine. 04/09/17   [provider]    Family History Family History  Problem Relation Age of Onset  . Asthma Mother   . Cancer Father     Social History Social History   Tobacco Use  . Smoking status: Current Every Day Smoker    Packs/day: 1.00    Years: 41.00    Pack years: 41.00    Types: Cigarettes  . Smokeless tobacco: Never Used  Substance Use Topics  . Alcohol use: No  . Drug use: Yes    Frequency: 2.0 times per week    Types: Marijuana     Allergies   Penicillins and Sulfa antibiotics   Review of Systems Review of Systems  Unable to perform ROS: Acuity of condition     Physical Exam Updated Vital Signs BP (!) 80/47   Pulse 84   Temp (!) 97 F (36.1 C)   Resp 17   Ht 1.575 m (5' 2" )   Wt 77.1 kg (170 lb)   SpO2 100%   BMI 31.09 kg/m   Physical Exam  Constitutional: She appears well-developed and well-nourished.  HENT:  Head: Normocephalic and atraumatic.  Right Ear: External ear normal.  Left Ear: External ear normal.  Mucous membranes appear dry  Eyes: Pupils are equal, round, and reactive to light. EOM are normal.  Neck: Normal range of motion. Neck supple.  Cardiovascular: Normal rate and regular rhythm.  Pulmonary/Chest:  Diffuse rhonchi with some scattered expiratory wheezes  Abdominal: Soft. She exhibits no distension. There is tenderness. There is no guarding.  Diffusely tender to palpation  Musculoskeletal: Normal range of motion.    Neurological:  Decreased responsiveness but does open eyes and answer some questions No lateralized deficits noticed but generalized diffuse weakness Deep tendon reflexes are equal I did not attempt to assess sensory deficits to acute illness decreased mental status  Skin: Skin is warm and dry. Capillary refill takes less than 2 seconds.  Nursing note and vitals reviewed.    ED Treatments /  Results  Labs (all labs ordered are listed, but only abnormal results are displayed) Labs Reviewed  CBC WITH DIFFERENTIAL/PLATELET - Abnormal; Notable for the following components:      Result Value   RBC 2.93 (*)    Hemoglobin 10.8 (*)    HCT 29.9 (*)    MCV 102.0 (*)    MCH 36.9 (*)    MCHC 36.1 (*)    Platelets 78 (*)    All other components within normal limits  URINALYSIS, ROUTINE W REFLEX MICROSCOPIC - Abnormal; Notable for the following components:   Hgb urine dipstick MODERATE (*)    Bacteria, UA RARE (*)    All other components within normal limits  BLOOD GAS, ARTERIAL - Abnormal; Notable for the following components:   pH, Arterial 7.571 (*)    pCO2 arterial 30.6 (*)    Bicarbonate 30.1 (*)    Acid-Base Excess 5.7 (*)    All other components within normal limits  RAPID URINE DRUG SCREEN, HOSP PERFORMED - Abnormal; Notable for the following components:   Opiates POSITIVE (*)    Benzodiazepines POSITIVE (*)    Tetrahydrocannabinol POSITIVE (*)    Barbiturates   (*)    Value: Result not available. Reagent lot number recalled by manufacturer.   All other components within normal limits  ACETAMINOPHEN LEVEL - Abnormal; Notable for the following components:   Acetaminophen (Tylenol), Serum <10 (*)    All other components within normal limits  COMPREHENSIVE METABOLIC PANEL - Abnormal; Notable for the following components:   Potassium <2.0 (*)    CO2 21 (*)    Glucose, Bld 130 (*)    Calcium 5.3 (*)    Total Protein 4.0 (*)    Albumin 2.0 (*)    ALT 11 (*)    All other  components within normal limits  TROPONIN I - Abnormal; Notable for the following components:   Troponin I 0.08 (*)    All other components within normal limits  MAGNESIUM - Abnormal; Notable for the following components:   Magnesium 1.3 (*)    All other components within normal limits  CBG MONITORING, ED - Abnormal; Notable for the following components:   Glucose-Capillary 180 (*)    All other components within normal limits  I-STAT CG4 LACTIC ACID, ED - Abnormal; Notable for the following components:   Lactic Acid, Venous 4.58 (*)    All other components within normal limits  CULTURE, BLOOD (ROUTINE X 2)  CULTURE, BLOOD (ROUTINE X 2)  URINE CULTURE  ETHANOL  SALICYLATE LEVEL  I-STAT CG4 LACTIC ACID, ED  TYPE AND SCREEN    EKG None  Radiology Ct Head Wo Contrast  Result Date: 06/11/2018 CLINICAL DATA:  Altered mental status. EXAM: CT HEAD WITHOUT CONTRAST TECHNIQUE: Contiguous axial images were obtained from the base of the skull through the vertex without intravenous contrast. COMPARISON:  01/23/2018 FINDINGS: Brain: No evidence of acute infarction, hemorrhage, hydrocephalus, extra-axial collection or mass lesion/mass effect. There is sulcal enlargement consistent with mild atrophy. Old infarct is noted at the junction of the posterior left frontal and left parietal lobe superiorly, stable from the prior exam. Vascular: No hyperdense vessel or unexpected calcification. Skull: Normal. Negative for fracture or focal lesion. Sinuses/Orbits: Visualized globes and orbits are unremarkable. The visualized sinuses and mastoid air cells are clear. Other: None. IMPRESSION: 1. No acute intracranial abnormalities. 2. Mild atrophy.  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 Electronically Signed   By: Lajean Manes M.D.   On:  06/11/2018 12:51   Ct Abdomen Pelvis W Contrast  Result Date: 06/11/2018 CLINICAL DATA:  57 year old female with history of altered mental status. Possible abdominal mass  noted on physical examination. EXAM: CT ABDOMEN AND PELVIS WITH CONTRAST TECHNIQUE: Multidetector CT imaging of the abdomen and pelvis was performed using the standard protocol following bolus administration of intravenous contrast. CONTRAST:  141m ISOVUE-300 IOPAMIDOL (ISOVUE-300) INJECTION 61% COMPARISON:  CT the abdomen and pelvis 07/13/2017. FINDINGS: Lower chest: Unremarkable. Hepatobiliary: Diffuse low attenuation throughout the hepatic parenchyma, indicative of severe hepatic steatosis. Liver also has a shrunken appearance and nodular contour, indicative of a background of cirrhosis. No definite cystic or solid hepatic lesions. No intra or extrahepatic biliary ductal dilatation. Small amount of pneumobilia in the left lobe of the liver, indicative of prior sphincterotomy. Status post cholecystectomy. Pancreas: No pancreatic mass. No pancreatic ductal dilatation. No pancreatic or peripancreatic fluid or inflammatory changes. Spleen: Unremarkable. Adrenals/Urinary Tract: Bilateral kidneys and bilateral adrenal glands are normal in appearance. No hydroureteronephrosis. Foley balloon catheter present in the lumen of the urinary bladder. Urinary bladder is nearly decompressed, but otherwise unremarkable in appearance. Stomach/Bowel: Normal appearance of the stomach. No pathologic dilatation of small bowel or colon. Severe mural thickening throughout the entire colon and rectum with surrounding inflammatory changes in the adjacent mesocolon and mesorectum, indicative of severe pancolitis. Normal appendix. Vascular/Lymphatic: Aortic atherosclerosis, without evidence of aneurysm or dissection in the abdominal or pelvic vasculature. High-grade stenosis or complete occlusion of the left common iliac artery with distal reconstitution of flow related to collateral pathways. No lymphadenopathy noted in the abdomen or pelvis. Reproductive: Status post hysterectomy. Ovaries are not confidently identified may be surgically  absent or atrophic. Other: Trace volume of ascites.  No pneumoperitoneum. Musculoskeletal: Chronic compression fracture of L4 with approximately 20% loss of anterior vertebral body height, unchanged. There are no aggressive appearing lytic or blastic lesions noted in the visualized portions of the skeleton. IMPRESSION: 1. Severe pancolitis. Clinical correlation for signs and symptoms of C difficile colitis is suggested. 2. Hepatic steatosis with evidence of cirrhosis. 3. Aortic atherosclerosis, including high-grade stenosis or occlusion of the left common iliac artery. 4. Additional incidental findings, as above. Electronically Signed   By: DVinnie LangtonM.D.   On: 06/11/2018 12:36   Dg Chest Port 1 View  Result Date: 06/11/2018 CLINICAL DATA:  57year old female with history of altered mental status. Code sepsis. EXAM: PORTABLE CHEST 1 VIEW COMPARISON:  Chest x-Delita Chiquito 01/22/2018. FINDINGS: There is cephalization of the pulmonary vasculature and slight indistinctness of the interstitial markings suggestive of mild pulmonary edema. No pleural effusions. Mild cardiomegaly. Upper mediastinal contours are within normal limits. Aortic atherosclerosis. IMPRESSION: 1. The appearance the chest may suggest mild congestive heart failure. 2. Aortic atherosclerosis. Electronically Signed   By: DVinnie LangtonM.D.   On: 06/11/2018 12:28    Procedures .Central Line Date/Time: 06/11/2018 3:08 PM Performed by: RPattricia Boss MD Authorized by: RPattricia Boss MD   Consent:    Consent obtained:  Emergent situation   Consent given by:  Patient   Alternatives discussed:  No treatment Pre-procedure details:    Hand hygiene: Hand hygiene performed prior to insertion     Sterile barrier technique: All elements of maximal sterile technique followed     Skin preparation:  2% chlorhexidine   Skin preparation agent: Skin preparation agent completely dried prior to procedure   Anesthesia (see MAR for exact dosages):     Anesthesia method:  Local infiltration Procedure details:  Location:  R femoral   Patient position:  Reverse Trendelenburg   Procedural supplies:  Triple lumen   Landmarks identified: yes     Ultrasound guidance: yes     Sterile ultrasound techniques: Sterile gel and sterile probe covers were used     Number of attempts:  2   Successful placement: yes   Post-procedure details:    Post-procedure:  Dressing applied and line sutured   Assessment:  Blood return through all ports and free fluid flow   Patient tolerance of procedure:  Tolerated well, no immediate complications Procedure Name: Intubation Date/Time: 06/11/2018 3:42 PM Performed by: Pattricia Boss, MD Pre-anesthesia Checklist: Patient identified Oxygen Delivery Method: Non-rebreather mask Preoxygenation: Pre-oxygenation with 100% oxygen Induction Type: Rapid sequence Ventilation: Mask ventilation without difficulty Laryngoscope Size: 4 Grade View: Grade II Tube type: Non-subglottic suction tube Tube size: 7.5 mm Number of attempts: 1 Airway Equipment and Method: Video-laryngoscopy Placement Confirmation: ETT inserted through vocal cords under direct vision,  Positive ETCO2,  CO2 detector and Breath sounds checked- equal and bilateral Secured at: 25 cm Tube secured with: ETT holder Dental Injury: Teeth and Oropharynx as per pre-operative assessment  Comments: Precedex ordered for post intubation sedation    .Critical Care Performed by: Pattricia Boss, MD Authorized by: Pattricia Boss, MD   Critical care provider statement:    Critical care time (minutes):  50   Critical care was necessary to treat or prevent imminent or life-threatening deterioration of the following conditions:  Cardiac failure, circulatory failure, CNS failure or compromise, dehydration, metabolic crisis, respiratory failure, shock and sepsis   Critical care was time spent personally by me on the following activities:  Blood draw for specimens,  discussions with consultants, evaluation of patient's response to treatment, examination of patient, obtaining history from patient or surrogate, ordering and performing treatments and interventions, ordering and review of laboratory studies, ordering and review of radiographic studies, pulse oximetry, re-evaluation of patient's condition, review of old charts and vascular access procedures   (including critical care time)  Medications Ordered in ED Medications  potassium chloride 10 mEq in 100 mL IVPB (has no administration in time range)  calcium gluconate 1 g in sodium chloride 0.9 % 100 mL IVPB (has no administration in time range)  lidocaine-EPINEPHrine (XYLOCAINE W/EPI) 2 %-1:200000 (PF) injection (has no administration in time range)  piperacillin-tazobactam (ZOSYN) IVPB 3.375 g (has no administration in time range)  vancomycin (VANCOCIN) IVPB 1000 mg/200 mL premix (has no administration in time range)  norepinephrine (LEVOPHED) 4-5 MG/250ML-% infusion SOLN (has no administration in time range)  sodium chloride 0.9 % bolus 1,000 mL (1,000 mLs Intravenous New Bag/Given 06/11/18 1110)    And  sodium chloride 0.9 % bolus 1,000 mL (1,000 mLs Intravenous New Bag/Given 06/11/18 1220)    And  sodium chloride 0.9 % bolus 500 mL (1,000 mLs Intravenous New Bag/Given 06/11/18 1313)  piperacillin-tazobactam (ZOSYN) IVPB 3.375 g (0 g Intravenous Stopped 06/11/18 1230)  vancomycin (VANCOCIN) 1,500 mg in sodium chloride 0.9 % 500 mL IVPB (1,500 mg Intravenous New Bag/Given 06/11/18 1235)  iopamidol (ISOVUE-300) 61 % injection 100 mL (100 mLs Intravenous Contrast Given 06/11/18 1207)  potassium chloride 10 MEQ/100ML IVPB (10 mEq  New Bag/Given 06/11/18 1329)     Initial Impression / Assessment and Plan / ED Course  I have reviewed the triage vital signs and the nursing notes.  Pertinent labs & imaging results that were available during my care of the patient were reviewed by me and considered  in my  medical decision making (see chart for details).  Clinical Course as of Jun 11 1506  Sun Jun 11, 2018  1504 Severe hypokalemia repletion ensuing   [DR]  1505 Severe hypocalcemia- calcium gluconate ordere  Calcium(!!): 5.3 [DR]  1505 Elevated troponin noted  Troponin I(!!): 0.08 [DR]  1506 CT ABDOMEN PELVIS W CONTRAST [DR]    Clinical Course User Index [DR] Pattricia Boss, MD    Code sepsis initiated on my initial evaluation.  Blood cultures have been obtained and patient has received broad-spectrum antibiotics.  She has been tachycardic and hypotensive with hypothermia. CT obtained of head and abdomen.  CT of abdomen is significant for severe pancolitis.  Hemoccult pending Patient received IV fluids at 30 cc/kg and continues to be hypotensive Norepinephrine started Central line placed in the ED due to hypotension and need for pressors, fluid, and multiple therapies including IV potassium as patient is severely hypokalemic and hypocalcemic EKG performed consistent with hypertension and demand ischemia.  Patient with elevated troponin.  I suspect this is secondary to other disease process. -1-sepsis-infection, elevated lactic acid with hypotension 2- hypotension 3-- altered mental status 4-hypokalemia 5-hypocalcemia 6 hypomagnesemia 7 cardiac ischemia likely secondary to hypotension 8- pan colitis 9- polysubstance use  Discussed with Dr. Jackey Loge on call for critical care Plan intubation prior to transfer Patient with improved bp on norepi    Final Clinical Impressions(s) / ED Diagnoses   Final diagnoses:  Sepsis, due to unspecified organism ([place])  Hypotension, unspecified hypotension type    ED Discharge Orders    None       Pattricia Boss, MD 06/11/18 1547    Pattricia Boss, MD 06/26/18 1505

## 2018-06-11 NOTE — H&P (Signed)
PULMONARY / CRITICAL CARE MEDICINE   Name: Deborah Russo MRN: [id number] DOB: June 20, 1961    ADMISSION DATE:  06/11/2018 CONSULTATION DATE:  6/23  REFERRING MD:  Jeanell Sparrow  CHIEF COMPLAINT:  SOB, AMS   HISTORY OF PRESENT ILLNESS:   [name] is a 57 y.o. female with PMHx significant for substance abuse and previous episode of being admitted approximately one year ago with AMS thought to be secondary to polysubstance abuse and unintentional overdose. The patient called EMS to her house for feeling SOB and overall malaise. The patient was altered in the ED at the OSH and was found to be hypotensive eventually requiring a CVC and ET tube placement for pressor support and airway protection, respectively. The patient was stabilized and sent to [facility] for ICU admission. Initial work up was remarkable for significant electrolyte abnormalities, Cr WNL however, a CT of the abdomen that revealed pan colitis, lactic acidosis, and a macrocytic anemia associated with thrombocytopenia with no leukocytosis. She was given broad spectrum antibiotics as well as electrolyte replacement prior to transport.   PAST MEDICAL HISTORY :  She  has a past medical history of Chronic abdominal pain, Chronic migraine, Chronic pain, Hypothyroidism, Nausea, vomiting, and diarrhea, Pain management, Sleep apnea, and Stroke ([place]).  PAST SURGICAL HISTORY: She  has a past surgical history that includes Cesarean section; Cholecystectomy; Abdominal hysterectomy; Abdominal surgery; Esophagogastroduodenoscopy (N/A, 07/17/2014); biopsy (N/A, 07/17/2014); and Breast surgery (Left).  Allergies  Allergen Reactions  . Penicillins Hives    Has patient had a PCN reaction causing immediate rash, facial/tongue/throat swelling, SOB or lightheadedness with hypotension: No Has patient had a PCN reaction causing severe rash involving mucus membranes or skin necrosis: No Has patient had a PCN reaction that required hospitalization  No Has patient had a PCN reaction occurring within the last 10 years: No If all of the above answers are "NO", then may proceed with Cephalosporin use.   . Sulfa Antibiotics Hives    No current facility-administered medications on file prior to encounter.    Current Outpatient Medications on File Prior to Encounter  Medication Sig  . aspirin 325 MG tablet Take 1 tablet (325 mg total) by mouth daily.  . diazepam (VALIUM) 5 MG tablet Take 0.5 tablets (2.5 mg total) by mouth 3 (three) times daily.  . famotidine (PEPCID) 20 MG tablet Take 1 tablet (20 mg total) by mouth 2 (two) times daily.  Marland Kitchen levothyroxine (SYNTHROID, LEVOTHROID) 200 MCG tablet TAKE 1 TABLET DAILY  . lisinopril (PRINIVIL,ZESTRIL) 10 MG tablet TAKE 1 TABLET DAILY  . omeprazole (PRILOSEC) 20 MG capsule TAKE 1 TABLET DAILY  . oxyCODONE (OXYCONTIN) 80 mg 12 hr tablet Take 1 tablet (80 mg total) by mouth every 12 (twelve) hours.  Marland Kitchen oxyCODONE-acetaminophen (PERCOCET) 10-325 MG tablet Take 1 tablet by mouth 2 (two) times daily.  . pantoprazole (PROTONIX) 20 MG tablet TAKE 1 TABLET ONCE DAILY  . pravastatin (PRAVACHOL) 20 MG tablet TAKE 1 TABLET DAILY  . senna-docusate (SENOKOT-S) 8.6-50 MG tablet Take 1 tablet by mouth at bedtime as needed for mild constipation.  . sucralfate (CARAFATE) 1 g tablet TAKE  (1)  TABLET  THREE TIMES DAILY AS NEEDED. (Patient taking differently: TAKE  (1)  TABLET  THREE TIMES DAILY AS NEEDED FOR STOMACH PAIN)  . SUMAtriptan (IMITREX) 100 MG tablet Take 100 mg by mouth daily as needed for migraine.    FAMILY HISTORY:  Her indicated that her mother is deceased. She indicated that her father is  deceased. She indicated that both of her sisters are alive.   SOCIAL HISTORY: She  reports that she has been smoking cigarettes.  She has a 41.00 pack-year smoking history. She has never used smokeless tobacco. She reports that she has current or past drug history. Drug: Marijuana. Frequency: 2.00 times per week.  She reports that she does not drink alcohol.  REVIEW OF SYSTEMS:   Unable to perform secondary to condition.   SUBJECTIVE:  Sedated and ventilated.   VITAL SIGNS: BP (!) 80/65   Pulse (!) 59   Temp 98.1 F (36.7 C) (Oral)   Resp 17   Ht 5' 2"  (1.575 m)   Wt 77.1 kg (170 lb)   SpO2 100%   BMI 31.09 kg/m   HEMODYNAMICS:    VENTILATOR SETTINGS: Vent Mode: PRVC FiO2 (%):  [50 %-100 %] 50 % Set Rate:  [20 bmp] 20 bmp Vt Set:  [400 mL-450 mL] 400 mL PEEP:  [5 cmH20] 5 cmH20 Plateau Pressure:  [10 cmH20-15 cmH20] 10 cmH20  INTAKE / OUTPUT: I/O last 3 completed shifts: In: 3600 [I.V.:2500; IV Piggyback:1100] Out: 1000 [Urine:1000]  PHYSICAL EXAMINATION: General:  Ill appearing disheveled ventilated woman with visible dirt on the elbows and feet bilaterally.  Neuro:  Able to open eyes on command. Spontaneously moving all extremities. No focal deficits appreciated HEENT:  No LAD, ET tube in place (25 at the lip) Cardiovascular:  Bradycardic without murmurs  Lungs:  Coarse bilaterally without appreciable wheezing, rales, or crackles present Abdomen:  TTP in all quadrants, no guarding. BS hyperactive  Musculoskeletal:  No visible lesions, no edema present Skin:  Dry and coarse with dirt present on feet and elbows. No lesions identified on sacrum   LABS:  BMET Recent Labs  Lab 06/11/18 1238  NA 136  K <2.0*  CL 106  CO2 21*  BUN 8  CREATININE 0.75  GLUCOSE 130*    Electrolytes Recent Labs  Lab 06/11/18 1238 06/11/18 1243  CALCIUM 5.3*  --   MG  --  1.3*    CBC Recent Labs  Lab 06/11/18 1141  WBC 6.3  HGB 10.8*  HCT 29.9*  PLT 78*    Coag's No results for input(s): APTT, INR in the last 168 hours.  Sepsis Markers Recent Labs  Lab 06/11/18 1212 06/11/18 1651  LATICACIDVEN 4.58* 3.0*    ABG Recent Labs  Lab 06/11/18 1150  PHART 7.571*  PCO2ART 30.6*  PO2ART 99.1    Liver Enzymes Recent Labs  Lab 06/11/18 1238  AST 19  ALT 11*   ALKPHOS 66  BILITOT 0.7  ALBUMIN 2.0*    Cardiac Enzymes Recent Labs  Lab 06/11/18 1238  TROPONINI 0.08*    Glucose Recent Labs  Lab 06/11/18 1245  GLUCAP 180*    Imaging Ct Head Wo Contrast  Result Date: 06/11/2018 CLINICAL DATA:  Altered mental status. EXAM: CT HEAD WITHOUT CONTRAST TECHNIQUE: Contiguous axial images were obtained from the base of the skull through the vertex without intravenous contrast. COMPARISON:  01/23/2018 FINDINGS: Brain: No evidence of acute infarction, hemorrhage, hydrocephalus, extra-axial collection or mass lesion/mass effect. There is sulcal enlargement consistent with mild atrophy. Old infarct is noted at the junction of the posterior left frontal and left parietal lobe superiorly, stable from the prior exam. Vascular: No hyperdense vessel or unexpected calcification. Skull: Normal. Negative for fracture or focal lesion. Sinuses/Orbits: Visualized globes and orbits are unremarkable. The visualized sinuses and mastoid air cells are clear. Other: None. IMPRESSION: 1.  No acute intracranial abnormalities. 2. Mild atrophy.  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 Electronically Signed   By: Lajean Manes M.D.   On: 06/11/2018 12:51   Ct Abdomen Pelvis W Contrast  Result Date: 06/11/2018 CLINICAL DATA:  57 year old female with history of altered mental status. Possible abdominal mass noted on physical examination. EXAM: CT ABDOMEN AND PELVIS WITH CONTRAST TECHNIQUE: Multidetector CT imaging of the abdomen and pelvis was performed using the standard protocol following bolus administration of intravenous contrast. CONTRAST:  16m ISOVUE-300 IOPAMIDOL (ISOVUE-300) INJECTION 61% COMPARISON:  CT the abdomen and pelvis 07/13/2017. FINDINGS: Lower chest: Unremarkable. Hepatobiliary: Diffuse low attenuation throughout the hepatic parenchyma, indicative of severe hepatic steatosis. Liver also has a shrunken appearance and nodular contour, indicative of a background  of cirrhosis. No definite cystic or solid hepatic lesions. No intra or extrahepatic biliary ductal dilatation. Small amount of pneumobilia in the left lobe of the liver, indicative of prior sphincterotomy. Status post cholecystectomy. Pancreas: No pancreatic mass. No pancreatic ductal dilatation. No pancreatic or peripancreatic fluid or inflammatory changes. Spleen: Unremarkable. Adrenals/Urinary Tract: Bilateral kidneys and bilateral adrenal glands are normal in appearance. No hydroureteronephrosis. Foley balloon catheter present in the lumen of the urinary bladder. Urinary bladder is nearly decompressed, but otherwise unremarkable in appearance. Stomach/Bowel: Normal appearance of the stomach. No pathologic dilatation of small bowel or colon. Severe mural thickening throughout the entire colon and rectum with surrounding inflammatory changes in the adjacent mesocolon and mesorectum, indicative of severe pancolitis. Normal appendix. Vascular/Lymphatic: Aortic atherosclerosis, without evidence of aneurysm or dissection in the abdominal or pelvic vasculature. High-grade stenosis or complete occlusion of the left common iliac artery with distal reconstitution of flow related to collateral pathways. No lymphadenopathy noted in the abdomen or pelvis. Reproductive: Status post hysterectomy. Ovaries are not confidently identified may be surgically absent or atrophic. Other: Trace volume of ascites.  No pneumoperitoneum. Musculoskeletal: Chronic compression fracture of L4 with approximately 20% loss of anterior vertebral body height, unchanged. There are no aggressive appearing lytic or blastic lesions noted in the visualized portions of the skeleton. IMPRESSION: 1. Severe pancolitis. Clinical correlation for signs and symptoms of C difficile colitis is suggested. 2. Hepatic steatosis with evidence of cirrhosis. 3. Aortic atherosclerosis, including high-grade stenosis or occlusion of the left common iliac artery. 4.  Additional incidental findings, as above. Electronically Signed   By: DVinnie LangtonM.D.   On: 06/11/2018 12:36   Dg Chest Portable 1 View  Result Date: 06/11/2018 CLINICAL DATA:  57year old female status post endotracheal tube placement. Orogastric tube placement. EXAM: PORTABLE CHEST 1 VIEW COMPARISON:  Chest x-ray 06/11/2018. FINDINGS: An endotracheal tube is in place with tip 1.7 cm above the carina. A nasogastric tube is seen extending into the stomach, however, the tip of the nasogastric tube extends below the lower margin of the image. Low lung volumes. No consolidative airspace disease. There is cephalization of the pulmonary vasculature and slight indistinctness of the interstitial markings suggestive of mild pulmonary edema. No pleural effusions. Mild cardiomegaly. Upper mediastinal contours are within normal limits. Aortic atherosclerosis. IMPRESSION: 1. Support apparatus, as above. 2. The appearance the chest suggests mild congestive heart failure, as above. 3. Aortic atherosclerosis. Electronically Signed   By: DVinnie LangtonM.D.   On: 06/11/2018 16:37   Dg Chest Port 1 View  Result Date: 06/11/2018 CLINICAL DATA:  57year old female with history of altered mental status. Code sepsis. EXAM: PORTABLE CHEST 1 VIEW COMPARISON:  Chest x-ray 01/22/2018. FINDINGS: There is cephalization  of the pulmonary vasculature and slight indistinctness of the interstitial markings suggestive of mild pulmonary edema. No pleural effusions. Mild cardiomegaly. Upper mediastinal contours are within normal limits. Aortic atherosclerosis. IMPRESSION: 1. The appearance the chest may suggest mild congestive heart failure. 2. Aortic atherosclerosis. Electronically Signed   By: Vinnie Langton M.D.   On: 06/11/2018 12:28     STUDIES:  CT abdomen:  IMPRESSION: 1. Severe pancolitis. Clinical correlation for signs and symptoms of C difficile colitis is suggested. 2. Hepatic steatosis with evidence of  cirrhosis. 3. Aortic atherosclerosis, including high-grade stenosis or occlusion of the left common iliac artery.  CT head: IMPRESSION: 1. No acute intracranial abnormalities. 2. Mild atrophy.  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  CXR: Personal read: No significant pulmonary abnormalities of note, ET tube right on the carina and will need to be pulled back ~3cm.   CULTURES: Pending   ANTIBIOTICS: Vanc/Zosyn 6/23>>>  SIGNIFICANT EVENTS: Intubated for airway protection CVC placed for pressor administration   LINES/TUBES: ET Tube CVC  DISCUSSION: Deborah Russo is a 56 y.o. female with history of substance abuse who was brought to OSH ED with AMS and eventually intubated for airway protection and hypotension requiring vasopressor support. CT abdomen concerning for pan colitis. Electrolyte abnormalities in setting of no elevated creatinine. Mildly elevated troponin without STEMI on EKG but strain changes. Started on broad spectrum ABx for lactic acidosis and hypotension.   ASSESSMENT / PLAN:  PULMONARY A: Mech ventilated for airway protection  P:   -Full vent support with weaning to keep SpO2 >92% -ABG PRN   CARDIOVASCULAR A:  Strain pattern on EKG but no STEMI, Trops mildly elevated. Likely demand ischemia secondary to hypotension. On pressors  P:  -Follow troponins -EKG PRN  -Pressors to keep MAP >60 -ECHO ordered and pending  -Monitor telemetry -Keep K>4 and Mg>2   RENAL A:   No significant AKI, however concerning derangement of electrolytes seen  P:   -Replace electrolytes aggressively  -Monitor UOP -Follow up serial BMP, Mg, Phos, Ca2+  GASTROINTESTINAL A:   CT with pan colitis, no clear localized source. May be secondary to hypotension.  P:   -Covering GN anerobes with zosyn  -May consider surgical consult if patient decompensates or abdominal exam changes significantly  -Trending LA -IVF PRN   HEMATOLOGIC A:   Mild macrocytic anemia  present with thrombocytopenia  P:  -Folate, thiamine  -Follow CBC -Transfuse if <7  INFECTIOUS A:   No clear source of sepsis, possible intra-abdominal infection.  P:   -Continue vanc/zosyn -Follow up cultures  ENDOCRINE A:   No acute concerns    P:   -Follow up BG and initiate Sliding Scale if needed   NEUROLOGIC A:   AMS with possible history of stroke and substance abuse. CT head without any intracranial abnormalities.  P:   RASS goal: -2 overnight -Continue neuro checks periodically.  -Propofol with fentanyl PRN for sedation    FAMILY  - Updates:  Family needs to be updated, attempted a call but was unsuccessful.  - Inter-disciplinary family meet or Palliative Care meeting due by:  6/30   Caffie Damme, MD Pulmonary and [place] Pager: [phone]  06/11/2018, 7:02 PM

## 2018-06-11 NOTE — ED Notes (Signed)
Vanc received  There are no available channels for pump avail in the department [facility] apprised

## 2018-06-11 NOTE — ED Notes (Addendum)
Report to Delane Ginger, RN , [facility]

## 2018-06-11 NOTE — ED Notes (Signed)
IV x2 Antibiotic infusing Foley temp  PT to CT

## 2018-06-11 NOTE — ED Notes (Signed)
Decision to instill a central line Pt moved to rm 1 for more room  Report to Cristopher Peru RN   Continued with pt to assist in line placement

## 2018-06-11 NOTE — ED Notes (Signed)
CRITICAL VALUE ALERT  Critical Value:  Ph 7.571, pco2 30.6,  po2 99.1, bicarb 30.1, so2 98.3  Date & Time Notied:  06/11/2018, 1208  Provider Notified: Dr. Jeanell Sparrow  Orders Received/Actions taken: see chart

## 2018-06-11 NOTE — ED Notes (Signed)
Pt moved from rm 5 to rm 1  Central line placement by Dr Jeanell Sparrow, Cristopher Peru, RN, A Tutlle RN and [place] paramedic student observed  Call to bed control re: Code sepsis with no response Confirmed that code sepsis went out "it got paged, but don't know why no one responded"  Dr Jeanell Sparrow informed  Line placement to L Fem Orders for nor epi- hung and placed to L fem

## 2018-06-11 NOTE — ED Notes (Signed)
Call from [place] in the lab  K less than 2 Ca 5.3 Trop 0.08  Dr Jeanell Sparrow is informed

## 2018-06-11 NOTE — ED Notes (Signed)
Awaiting carelink

## 2018-06-11 NOTE — ED Notes (Signed)
Call from [place]

## 2018-06-11 NOTE — Progress Notes (Signed)
[place] Progress Note Patient Name: Deborah Russo DOB: 1961/11/04 MRN: [id number]   Date of Service  06/11/2018  HPI/Events of Note  Na+ = 136 --> 131, K+ < 2.0, PO4--- < 1.0, Ca++ = 7.4 which corrects to 8.52 given albumin = 2.5 and Creatinine = 0.97. K+ already being replaced.  Patient is on LR IV infusion. Troponin = 1.17. Demand Ischemia? Already on ASA.  eICU Interventions  Will order: 1. Replace PO4---, Ca++. 2. D/C LR IV infusion. 3. 0.9 NaCl to run IV at 100 mL/hour.  4. Continue to trend Troponin.       Intervention Category Major Interventions: Electrolyte abnormality - evaluation and management Intermediate Interventions: Diagnostic test evaluation  Deborah Russo [name] 06/11/2018, 11:22 PM

## 2018-06-11 NOTE — Sedation Documentation (Addendum)
Pt intubated with size 7.5 ett, positive color change, no sounds over epigastrum, bilateral breath sounds auscultated.  Respiratory secured tube at 25cm at lip.  OG ordered.  Portable chest x ray ordered.

## 2018-06-12 ENCOUNTER — Inpatient Hospital Stay (HOSPITAL_COMMUNITY): Payer: Medicare Other

## 2018-06-12 ENCOUNTER — Encounter (HOSPITAL_COMMUNITY): Payer: Self-pay

## 2018-06-12 DIAGNOSIS — J9601 Acute respiratory failure with hypoxia: Secondary | ICD-10-CM

## 2018-06-12 DIAGNOSIS — I10 Essential (primary) hypertension: Secondary | ICD-10-CM

## 2018-06-12 LAB — GLUCOSE, CAPILLARY
GLUCOSE-CAPILLARY: 294 mg/dL — AB (ref 65–99)
Glucose-Capillary: 179 mg/dL — ABNORMAL HIGH (ref 65–99)
Glucose-Capillary: 259 mg/dL — ABNORMAL HIGH (ref 65–99)
Glucose-Capillary: 285 mg/dL — ABNORMAL HIGH (ref 65–99)
Glucose-Capillary: 298 mg/dL — ABNORMAL HIGH (ref 65–99)
Glucose-Capillary: 302 mg/dL — ABNORMAL HIGH (ref 65–99)

## 2018-06-12 LAB — BASIC METABOLIC PANEL
ANION GAP: 10 (ref 5–15)
Anion gap: 13 (ref 5–15)
BUN: 6 mg/dL (ref 6–20)
BUN: <5 mg/dL — ABNORMAL LOW (ref 6–20)
CALCIUM: 7.3 mg/dL — AB (ref 8.9–10.3)
CHLORIDE: 98 mmol/L — AB (ref 101–111)
CO2: 22 mmol/L (ref 22–32)
CO2: 21 mmol/L — ABNORMAL LOW (ref 22–32)
Calcium: 7.6 mg/dL — ABNORMAL LOW (ref 8.9–10.3)
Chloride: 101 mmol/L (ref 101–111)
Creatinine, Ser: 0.96 mg/dL (ref 0.44–1.00)
Creatinine, Ser: 0.96 mg/dL (ref 0.44–1.00)
GFR calc Af Amer: >60 mL/min (ref >60)
GLUCOSE: 262 mg/dL — AB (ref 65–99)
Glucose, Bld: 265 mg/dL — ABNORMAL HIGH (ref 65–99)
POTASSIUM: 3 mmol/L — AB (ref 3.5–5.1)
Potassium: <2 mmol/L — CL (ref 3.5–5.1)
SODIUM: 133 mmol/L — AB (ref 135–145)
SODIUM: 132 mmol/L — AB (ref 135–145)

## 2018-06-12 LAB — CBC
HCT: 29.4 % — ABNORMAL LOW (ref 36.0–46.0)
Hemoglobin: 10.4 g/dL — ABNORMAL LOW (ref 12.0–15.0)
MCH: 35.6 pg — ABNORMAL HIGH (ref 26.0–34.0)
MCHC: 35.4 g/dL (ref 30.0–36.0)
MCV: 100.7 fL — ABNORMAL HIGH (ref 78.0–100.0)
PLATELETS: 104 10*3/uL — AB (ref 150–400)
RBC: 2.92 MIL/uL — ABNORMAL LOW (ref 3.87–5.11)
RDW: 14.2 % (ref 11.5–15.5)
WBC: 17.8 10*3/uL — AB (ref 4.0–10.5)

## 2018-06-12 LAB — POCT I-STAT 3, ART BLOOD GAS (G3+)
ACID-BASE DEFICIT: 4 mmol/L — AB (ref 0.0–2.0)
Bicarbonate: 19 mmol/L — ABNORMAL LOW (ref 20.0–28.0)
O2 SAT: 99 %
TCO2: 20 mmol/L — AB (ref 22–32)
pCO2 arterial: 29.2 mmHg — ABNORMAL LOW (ref 32.0–48.0)
pH, Arterial: 7.423 (ref 7.350–7.450)
pO2, Arterial: 150 mmHg — ABNORMAL HIGH (ref 83.0–108.0)

## 2018-06-12 LAB — CK: Total CK: 274 U/L — ABNORMAL HIGH (ref 38–234)

## 2018-06-12 LAB — PROCALCITONIN: Procalcitonin: 0.12 ng/mL

## 2018-06-12 LAB — ECHOCARDIOGRAM COMPLETE
Height: 62 in
Weight: 2719.59 oz

## 2018-06-12 LAB — TROPONIN I
TROPONIN I: 1.2 ng/mL — AB (ref <0.03)
TROPONIN I: 1.2 ng/mL — AB (ref <0.03)

## 2018-06-12 LAB — HIV ANTIBODY (ROUTINE TESTING W REFLEX): HIV SCREEN 4TH GENERATION: NONREACTIVE

## 2018-06-12 LAB — ABO/RH: ABO/RH(D): B POS

## 2018-06-12 LAB — CREATININE, URINE, RANDOM: CREATININE, URINE: 25.69 mg/dL

## 2018-06-12 LAB — MAGNESIUM
MAGNESIUM: 1.8 mg/dL (ref 1.7–2.4)
MAGNESIUM: 1.9 mg/dL (ref 1.7–2.4)

## 2018-06-12 LAB — NA AND K (SODIUM & POTASSIUM), RAND UR
Potassium Urine: 17 mmol/L
Sodium, Ur: 123 mmol/L

## 2018-06-12 LAB — PHOSPHORUS: Phosphorus: 2.3 mg/dL — ABNORMAL LOW (ref 2.5–4.6)

## 2018-06-12 MED ORDER — MAGNESIUM OXIDE 400 (241.3 MG) MG PO TABS
400.0000 mg | ORAL_TABLET | Freq: Every day | ORAL | Status: AC
  Administered 2018-06-12 – 2018-06-16 (×5): 400 mg via ORAL
  Filled 2018-06-12 (×5): qty 1

## 2018-06-12 MED ORDER — INSULIN ASPART 100 UNIT/ML ~~LOC~~ SOLN
1.0000 [IU] | SUBCUTANEOUS | Status: DC
  Administered 2018-06-12: 3 [IU] via SUBCUTANEOUS

## 2018-06-12 MED ORDER — SODIUM CHLORIDE 0.9 % IV SOLN
1.0000 g | Freq: Once | INTRAVENOUS | Status: AC
  Administered 2018-06-12: 1 g via INTRAVENOUS
  Filled 2018-06-12: qty 10

## 2018-06-12 MED ORDER — RINGERS IV SOLN: INTRAVENOUS | Status: DC

## 2018-06-12 MED ORDER — LACTATED RINGERS IV SOLN
INTRAVENOUS | Status: DC
  Administered 2018-06-12 – 2018-06-13 (×2): via INTRAVENOUS
  Administered 2018-06-13: 100 mL/h via INTRAVENOUS

## 2018-06-12 MED ORDER — FOLIC ACID 1 MG PO TABS
1.0000 mg | ORAL_TABLET | Freq: Every day | ORAL | Status: DC
  Administered 2018-06-13 – 2018-06-23 (×11): 1 mg
  Filled 2018-06-12 (×11): qty 1

## 2018-06-12 MED ORDER — SODIUM PHOSPHATES 45 MMOLE/15ML IV SOLN
10.0000 mmol | Freq: Once | INTRAVENOUS | Status: AC
  Administered 2018-06-12: 10 mmol via INTRAVENOUS
  Filled 2018-06-12: qty 3.33

## 2018-06-12 MED ORDER — STERILE WATER FOR INJECTION IV SOLN: INTRAVENOUS | Status: DC

## 2018-06-12 MED ORDER — INSULIN ASPART 100 UNIT/ML ~~LOC~~ SOLN
0.0000 [IU] | SUBCUTANEOUS | Status: DC
  Administered 2018-06-12: 3 [IU] via SUBCUTANEOUS
  Administered 2018-06-12: 11 [IU] via SUBCUTANEOUS
  Administered 2018-06-13 (×2): 2 [IU] via SUBCUTANEOUS
  Administered 2018-06-13: 3 [IU] via SUBCUTANEOUS
  Administered 2018-06-13: 2 [IU] via SUBCUTANEOUS
  Administered 2018-06-14: 3 [IU] via SUBCUTANEOUS
  Administered 2018-06-14: 11 [IU] via SUBCUTANEOUS
  Administered 2018-06-14 (×2): 2 [IU] via SUBCUTANEOUS
  Administered 2018-06-14: 5 [IU] via SUBCUTANEOUS
  Administered 2018-06-15: 3 [IU] via SUBCUTANEOUS
  Administered 2018-06-15 (×3): 2 [IU] via SUBCUTANEOUS
  Administered 2018-06-16: 3 [IU] via SUBCUTANEOUS
  Administered 2018-06-16 – 2018-06-17 (×3): 2 [IU] via SUBCUTANEOUS
  Administered 2018-06-17: 3 [IU] via SUBCUTANEOUS
  Administered 2018-06-17 – 2018-06-19 (×8): 2 [IU] via SUBCUTANEOUS
  Administered 2018-06-19: 3 [IU] via SUBCUTANEOUS
  Administered 2018-06-19 – 2018-06-22 (×7): 2 [IU] via SUBCUTANEOUS
  Administered 2018-06-23: 3 [IU] via SUBCUTANEOUS
  Administered 2018-06-23: 2 [IU] via SUBCUTANEOUS
  Administered 2018-06-23: 3 [IU] via SUBCUTANEOUS
  Administered 2018-06-24: 2 [IU] via SUBCUTANEOUS

## 2018-06-12 MED ORDER — MAGNESIUM SULFATE IN D5W 1-5 GM/100ML-% IV SOLN
1.0000 g | Freq: Once | INTRAVENOUS | Status: AC
  Administered 2018-06-12: 1 g via INTRAVENOUS
  Filled 2018-06-12: qty 100

## 2018-06-12 MED ORDER — FAMOTIDINE 40 MG/5ML PO SUSR
20.0000 mg | Freq: Two times a day (BID) | ORAL | Status: DC
  Administered 2018-06-12 – 2018-06-13 (×2): 20 mg via ORAL
  Filled 2018-06-12 (×2): qty 2.5

## 2018-06-12 MED ORDER — POTASSIUM CHLORIDE 20 MEQ PO PACK
40.0000 meq | PACK | Freq: Three times a day (TID) | ORAL | Status: DC
  Administered 2018-06-12 – 2018-06-13 (×4): 40 meq via ORAL
  Filled 2018-06-12 (×4): qty 2

## 2018-06-12 MED ORDER — PROMETHAZINE HCL 25 MG/ML IJ SOLN
12.5000 mg | Freq: Four times a day (QID) | INTRAMUSCULAR | Status: DC | PRN
  Administered 2018-06-12 – 2018-06-14 (×3): 12.5 mg via INTRAVENOUS
  Filled 2018-06-12 (×3): qty 1

## 2018-06-12 MED ORDER — POTASSIUM CHLORIDE 10 MEQ/50ML IV SOLN
10.0000 meq | INTRAVENOUS | Status: AC
  Administered 2018-06-12 (×6): 10 meq via INTRAVENOUS
  Filled 2018-06-12 (×6): qty 50

## 2018-06-12 MED ORDER — VITAMIN B-1 100 MG PO TABS
100.0000 mg | ORAL_TABLET | Freq: Every day | ORAL | Status: DC
  Administered 2018-06-13 – 2018-06-15 (×3): 100 mg
  Filled 2018-06-12 (×3): qty 1

## 2018-06-12 NOTE — Progress Notes (Signed)
  Echocardiogram 2D Echocardiogram has been performed.  Deborah Russo 06/12/2018, 9:26 AM

## 2018-06-12 NOTE — Progress Notes (Signed)
[place] Progress Note Patient Name: Deborah Russo DOB: [birth year] MRN: [id number]   Date of Service  06/12/2018  HPI/Events of Note  Troponin @ 5:00 AM this morning = 1.2. Nursing voices concern. However, 2D Cardiac Echo this morning reveals an estimated ejection fraction was in the range of 70% to 75%. There was dynamic obstruction at rest in the mid cavity. Wall motion was normal; there were no regional wall motion abnormalities. The cardiac echo is not c/w ischemic disease. Elevated Troponin likely demand ischemia.   eICU Interventions  No reason to repeat Troponin in absence of symptoms.      Intervention Category Intermediate Interventions: Diagnostic test evaluation  Jule Schlabach [name] 06/12/2018, 9:25 PM

## 2018-06-12 NOTE — Progress Notes (Signed)
CRITICAL VALUE ALERT  Critical Value:  Troponin 1.20   Date & Time Notied:  06/12/2018 0151  Provider Notified: Warren Lacy   Orders Received/Actions taken: No new orders

## 2018-06-12 NOTE — Progress Notes (Signed)
PULMONARY / CRITICAL CARE MEDICINE   Name: Deborah Russo MRN: [id number] DOB: [birth year]    ADMISSION DATE:  06/11/2018 CONSULTATION DATE: 06/11/2018  REFERRING MD: Pryor Curia, MD-[place], ED  CHIEF COMPLAINT: Hypotension and altered mental status  HISTORY OF PRESENT ILLNESS:   Deborah Russo is a 57 y.o. female with PMHx significant for substance abuse and previous episode of being admitted approximately one year ago with AMS thought to be secondary to polysubstance abuse and unintentional overdose. The patient was altered in the ED at the OSH and was found to be hypotensive eventually requiring a CVC and ET tube placement for pressor support and airway protection, respectively.  PAST MEDICAL HISTORY :  She  has a past medical history of Chronic abdominal pain, Chronic migraine, Chronic pain, Hypothyroidism, Nausea, vomiting, and diarrhea, Pain management, Sleep apnea, and Stroke ([facility]).  PAST SURGICAL HISTORY: She  has a past surgical history that includes Cesarean section; Cholecystectomy; Abdominal hysterectomy; Abdominal surgery; Esophagogastroduodenoscopy (N/A, 07/17/2014); biopsy (N/A, 07/17/2014); and Breast surgery (Left).  Allergies  Allergen Reactions  . Penicillins Hives    Has patient had a PCN reaction causing immediate rash, facial/tongue/throat swelling, SOB or lightheadedness with hypotension: No Has patient had a PCN reaction causing severe rash involving mucus membranes or skin necrosis: No Has patient had a PCN reaction that required hospitalization No Has patient had a PCN reaction occurring within the last 10 years: No If all of the above answers are "NO", then may proceed with Cephalosporin use.   . Sulfa Antibiotics Hives    No current facility-administered medications on file prior to encounter.    Current Outpatient Medications on File Prior to Encounter  Medication Sig  . amitriptyline (ELAVIL) 10 MG tablet Take 30 mg by mouth at bedtime.  .  baclofen (LIORESAL) 10 MG tablet Take 10-20 mg by mouth 3 (three) times daily as needed for muscle spasms.  . diazepam (VALIUM) 5 MG tablet Take 0.5 tablets (2.5 mg total) by mouth 3 (three) times daily. (Patient taking differently: Take 5 mg by mouth 3 (three) times daily. )  . oxyCODONE (OXYCONTIN) 80 mg 12 hr tablet Take 1 tablet (80 mg total) by mouth every 12 (twelve) hours.  Marland Kitchen oxyCODONE-acetaminophen (PERCOCET) 10-325 MG tablet Take 1 tablet by mouth 2 (two) times daily as needed (breakthrough pain).   Marland Kitchen aspirin 325 MG tablet Take 1 tablet (325 mg total) by mouth daily.  . famotidine (PEPCID) 20 MG tablet Take 1 tablet (20 mg total) by mouth 2 (two) times daily. (Patient not taking: Reported on 06/12/2018)  . levothyroxine (SYNTHROID, LEVOTHROID) 200 MCG tablet TAKE 1 TABLET DAILY (Patient not taking: Reported on 06/12/2018)  . lisinopril (PRINIVIL,ZESTRIL) 10 MG tablet TAKE 1 TABLET DAILY (Patient not taking: Reported on 06/12/2018)  . omeprazole (PRILOSEC) 20 MG capsule TAKE 1 TABLET DAILY (Patient not taking: Reported on 06/12/2018)  . pantoprazole (PROTONIX) 20 MG tablet TAKE 1 TABLET ONCE DAILY (Patient not taking: Reported on 06/12/2018)  . pravastatin (PRAVACHOL) 20 MG tablet TAKE 1 TABLET DAILY (Patient not taking: Reported on 06/12/2018)  . senna-docusate (SENOKOT-S) 8.6-50 MG tablet Take 1 tablet by mouth at bedtime as needed for mild constipation.  . sucralfate (CARAFATE) 1 g tablet TAKE  (1)  TABLET  THREE TIMES DAILY AS NEEDED. (Patient not taking: Reported on 06/12/2018)    FAMILY HISTORY:  Her indicated that her mother is deceased. She indicated that her father is deceased. She indicated that both of her sisters are  alive.   SOCIAL HISTORY: She  reports that she has been smoking cigarettes.  She has a 41.00 pack-year smoking history. She has never used smokeless tobacco. She reports that she has current or past drug history. Drugs: Marijuana and Oxycodone. Frequency: 2.00 times per  week. She reports that she does not drink alcohol.  REVIEW OF SYSTEMS:   Patient and her family endorse a several year history of diarrhea.  Remainder review of review of systems negative  SUBJECTIVE:  Patient is awake and denies pain.  Requesting ET tube to be removed.  VITAL SIGNS: BP 126/81   Pulse 72   Temp 100 F (37.8 C)   Resp 17   Ht 5' 2"  (1.575 m)   Wt 169 lb 15.6 oz (77.1 kg)   SpO2 100%   BMI 31.09 kg/m   HEMODYNAMICS:    VENTILATOR SETTINGS: Vent Mode: PSV;CPAP FiO2 (%):  [40 %-100 %] 40 % Set Rate:  [20 bmp] 20 bmp Vt Set:  [400 mL-450 mL] 400 mL PEEP:  [5 cmH20] 5 cmH20 Pressure Support:  [10 cmH20] 10 cmH20 Plateau Pressure:  [8 cmH20-12 cmH20] 12 cmH20  INTAKE / OUTPUT: I/O last 3 completed shifts: In: 6906 [I.V.:4455.3; IV Piggyback:2450.7] Out: 2050 [Urine:1700; Emesis/NG output:350]  PHYSICAL EXAMINATION: General: Obese.  Appears older than stated age.  Calm. Neuro: Able to follow commands.  No focal deficits. HEENT: Endotracheal tube in place. Cardiovascular: Extremities warm well perfused.  Heart sounds are unremarkable. Lungs: Chest clear to auscultation.  On pressure support ventilation with adequate tidal volume.  No accessory muscle use, no ventilator dyssynchrony. Abdomen: Soft and nontender.  Mild distention. Musculoskeletal: No edema. Skin: No rashes.  LABS:  BMET Recent Labs  Lab 06/11/18 2114 06/12/18 0452 06/12/18 1330  NA 131* 133* 132*  K <2.0* <2.0* 3.0*  CL 98* 101 98*  CO2 23 22 21*  BUN 6 6 <5*  CREATININE 0.97 0.96 0.96  GLUCOSE 324* 262* 265*    Electrolytes Recent Labs  Lab 06/11/18 2114 06/12/18 0452 06/12/18 1330  CALCIUM 7.4* 7.3* 7.6*  MG 2.0 1.8 1.9  PHOS <1.0* 2.3*  --     CBC Recent Labs  Lab 06/11/18 1141 06/11/18 2114 06/12/18 0452  WBC 6.3 15.2* 17.8*  HGB 10.8* 9.9* 10.4*  HCT 29.9* 27.1* 29.4*  PLT 78* 90* 104*    Coag's Recent Labs  Lab 06/11/18 2114  INR 1.22    Sepsis  Markers Recent Labs  Lab 06/11/18 1212 06/11/18 1651 06/11/18 2114  LATICACIDVEN 4.58* 3.0* 2.3*  PROCALCITON  --   --  0.12    ABG Recent Labs  Lab 06/11/18 1150 06/11/18 2235 06/12/18 0419  PHART 7.571* 7.437 7.423  PCO2ART 30.6* 32.6 29.2*  PO2ART 99.1 191.0* 150.0*    Liver Enzymes Recent Labs  Lab 06/11/18 1238 06/11/18 2114  AST 19 23  ALT 11* 13*  ALKPHOS 66 87  BILITOT 0.7 0.8  ALBUMIN 2.0* 2.6*    Cardiac Enzymes Recent Labs  Lab 06/11/18 2114 06/12/18 0102 06/12/18 0452  TROPONINI 1.17* 1.20* 1.20*    Glucose Recent Labs  Lab 06/11/18 1958 06/11/18 2314 06/12/18 0425 06/12/18 0749 06/12/18 1127 06/12/18 1524  GLUCAP 243* 248* 259* 285* 294* 298*    Imaging Dg Chest Port 1 View  Result Date: 06/11/2018 CLINICAL DATA:  Intubated. EXAM: PORTABLE CHEST 1 VIEW COMPARISON:  Earlier today. FINDINGS: The endotracheal tube has been advanced with its tip 5 mm above the carina. Nasogastric tube extending into  the stomach. Mildly improved enlargement of the cardiac silhouette. Decreased prominence of the pulmonary vasculature and interstitial markings. Linear and small amount of ill-defined density at the left lung base. Diffuse osteopenia. IMPRESSION: 1. Endotracheal tube tip 5 mm above the carina. It is recommended that this be retracted 4 cm. 2. Improved cardiomegaly and changes of congestive heart failure. 3. Left basilar atelectasis and possible small amount of pleural fluid. Electronically Signed   By: Claudie Revering M.D.   On: 06/11/2018 19:19   STUDIES:  Echocardiogram 06/12/2018 Study Conclusions  - Left ventricle: The cavity size was normal. There was moderate   concentric hypertrophy. Systolic function was hyperdynamic. The   estimated ejection fraction was in the range of 70% to 75%. There   was dynamic obstruction at restin the mid cavity. Wall motion was   normal; there were no regional wall motion abnormalities. Doppler   parameters are  consistent with abnormal left ventricular   relaxation (grade 1 diastolic dysfunction). Doppler parameters   are consistent with elevated mean left atrial filling pressure. - Aortic valve: Valve area (VTI): 2.53 cm^2. Valve area (Vmax):   3.21 cm^2. Valve area (Vmean): 2.52 cm^2.  CULTURES: Results for orders placed or performed during the hospital encounter of 06/11/18  Blood Culture (routine x 2)     Status: None (Preliminary result)   Collection Time: 06/11/18 11:41 AM  Result Value Ref Range Status   Specimen Description BLOOD LEFT ARM  Final   Special Requests   Final    BOTTLES DRAWN AEROBIC ONLY Blood Culture adequate volume   Culture   Final    NO GROWTH < 24 HOURS Performed at [facility], [address] [place], [place] [address]    Report Status PENDING  Incomplete  Blood Culture (routine x 2)     Status: None (Preliminary result)   Collection Time: 06/11/18 12:36 PM  Result Value Ref Range Status   Specimen Description BLOOD RIGHT HAND  Final   Special Requests   Final    BOTTLES DRAWN AEROBIC AND ANAEROBIC Blood Culture adequate volume   Culture   Final    NO GROWTH < 24 HOURS Performed at [facility], [address] [place], [place] [address]    Report Status PENDING  Incomplete  MRSA PCR Screening     Status: None   Collection Time: 06/11/18  6:34 PM  Result Value Ref Range Status   MRSA by PCR NEGATIVE NEGATIVE Final    Comment:        The GeneXpert MRSA Assay (FDA approved for NASAL specimens only), is one component of a comprehensive MRSA colonization surveillance program. It is not intended to diagnose MRSA infection nor to guide or monitor treatment for MRSA infections. Performed at [facility], [place] [address], [place], [place] [address]   Culture, blood (routine x 2)     Status: None (Preliminary result)   Collection Time: 06/11/18  7:40 PM  Result Value Ref Range Status   Specimen Description BLOOD RIGHT HAND  Final   Special Requests    Final    BOTTLES DRAWN AEROBIC ONLY Blood Culture adequate volume   Culture   Final    NO GROWTH < 24 HOURS Performed at [facility], [place] [address], [place], [place] [address]    Report Status PENDING  Incomplete  Culture, blood (routine x 2)     Status: None (Preliminary result)   Collection Time: 06/11/18  7:45 PM  Result  Value Ref Range Status   Specimen Description BLOOD RIGHT ANTECUBITAL  Final   Special Requests   Final    BOTTLES DRAWN AEROBIC ONLY Blood Culture adequate volume   Culture   Final    NO GROWTH < 24 HOURS Performed at [facility], [address] [place], [place] [address]    Report Status PENDING  Incomplete  Culture, respiratory (tracheal aspirate)     Status: None (Preliminary result)   Collection Time: 06/11/18 10:57 PM  Result Value Ref Range Status   Specimen Description TRACHEAL ASPIRATE  Final   Special Requests NONE  Final   Gram Stain   Final    WBC PRESENT, PREDOMINANTLY PMN NO ORGANISMS SEEN FEW GRAM POSITIVE RODS FEW YEAST Performed at [facility], [place] [address], [place],  [address]    Culture PENDING  Incomplete   Report Status PENDING  Incomplete    ANTIBIOTICS: Anti-infectives (From admission, onward)   Start     Dose/Rate Route Frequency Ordered Stop   06/12/18 0000  vancomycin (VANCOCIN) IVPB 1000 mg/200 mL premix     1,000 mg 200 mL/hr over 60 Minutes Intravenous Every 12 hours 06/11/18 1419     06/11/18 2100  piperacillin-tazobactam (ZOSYN) IVPB 3.375 g     3.375 g 12.5 mL/hr over 240 Minutes Intravenous Every 8 hours 06/11/18 1419     06/11/18 1300  vancomycin (VANCOCIN) 1,500 mg in sodium chloride 0.9 % 500 mL IVPB     1,500 mg 250 mL/hr over 120 Minutes Intravenous  Once 06/11/18 1143 06/11/18 1444   06/11/18 1145  piperacillin-tazobactam (ZOSYN) IVPB 3.375 g     3.375 g 100 mL/hr over 30 Minutes Intravenous  Once 06/11/18 1137 06/11/18 1230   06/11/18 1145  vancomycin (VANCOCIN) IVPB 1000  mg/200 mL premix  Status:  Discontinued     1,000 mg 200 mL/hr over 60 Minutes Intravenous  Once 06/11/18 1137 06/11/18 1142      SIGNIFICANT EVENTS: Past SBT and extubated 6/24  LINES/TUBES: Drain   NG/OG Tube Orogastric less than 1 day  Urethral Catheter less than 1 day     Airway   Airway 7.5 mm 1 day     CVC Line   CVC Triple Lumen 06/11/18 Right Femoral less than 1 day     PIV Line   Peripheral IV 06/11/18 Right Forearm 1 day  Peripheral IV 06/11/18 Left Hand less than 1 day     DISCUSSION: 57 year old woman who presents with altered mental status and hypotension in the context of chronic diarrhea with significant hypokalemia.  Hypotension and altered mental status have resolved with fluid administration.  Aggressive potassium replacement.  Needs work-up as to cause.  GI losses from diarrhea most likely.  ASSESSMENT / PLAN:  PULMONARY A: Acute respiratory failure due to inability to protect airway.  Mentation has improved and patient has passed SBT. P:   Extubate today Monitor for neuromuscular weakness Progressive ambulation  CARDIOVASCULAR A:  Hypotension due to hypovolemia.  Corrected with volume resuscitation. P:  Wean the norepinephrine to keep MAP greater than 65   RENAL A:   Unexplained hypokalemia and hypomagnesemia likely due to GI losses from diarrhea.  Low potassium to creatinine ratio effectively rules out renal potassium wasting. Volume Status: Clinically volume contracted. P:   Continue aggressive potassium and magnesium repletion.  Continue volume resuscitation Foley: Is required to monitor urine output.  GASTROINTESTINAL A:   History of diarrhea with evidence of pancolitis on  CT scan.  Stool studies pending. Nutritional Status: Moderate nutritional risk. P:   Further work-up for colitis once extubated and electrolytes corrected. Continue to cover for infectious source.  HEMATOLOGIC A:   At high risk for DVT DVT prophylaxis:  Unfractionated heparin; Transfusion requirements: No transfusion requirements P:  As above  INFECTIOUS A:   Improving leukocytosis and lactic acid. P:   Continue current antibiotics pending culture results.   ENDOCRINE A:   Hyperglycemia without known diabetes.  Likely stress hyperglycemia exacerbated by hypokalemia. Glycemic control: Suboptimal; Stress steroids: None P:   Start sensitive correction scale monitor for hypokalemia.  NEUROLOGIC A:   Altered mental status due to metabolic encephalopathy has resolved. P:   Monitor for development of delirium once extubated   FAMILY  - Updates: Patient's sisters have been updated at bedside 06/12/2018.  - Inter-disciplinary family meet or Palliative Care meeting due by:   day 7  CRITICAL CARE Performed by: Kipp Brood  Total critical care time: 40 minutes  Critical care time was exclusive of separately billable procedures and treating other patients.  Critical care was necessary to treat or prevent imminent or life-threatening deterioration.  Critical care was time spent personally by me on the following activities: development of treatment plan with patient and/or surrogate as well as nursing, discussions with consultants, evaluation of patient's response to treatment, examination of patient, obtaining history from patient or surrogate, ordering and performing treatments and interventions, ordering and review of laboratory studies, ordering and review of radiographic studies, pulse oximetry and re-evaluation of patient's condition.  Kipp Brood, MD [facility] and [place] Pager: [phone] After hours [phone]  06/12/2018, 4:09 PM

## 2018-06-12 NOTE — Progress Notes (Signed)
Inpatient Diabetes Program Recommendations  AACE/ADA: New Consensus Statement on Inpatient Glycemic Control (2015)  Target Ranges:  Prepandial:   less than 140 mg/dL      Peak postprandial:   less than 180 mg/dL (1-2 hours)      Critically ill patients:  140 - 180 mg/dL   Lab Results  Component Value Date   GLUCAP 294 (H) 06/12/2018   HGBA1C 5.0 03/29/2017    Review of Glycemic Control Results for Deborah Russo, Deborah Russo (MRN [id number]) as of 06/12/2018 11:51  Ref. Range 06/12/2018 04:25 06/12/2018 07:49 06/12/2018 11:27  Glucose-Capillary Latest Ref Range: 65 - 99 mg/dL 259 (H) 285 (H) 294 (H)   Diabetes history: None noted Outpatient Diabetes medications: none Current orders for Inpatient glycemic control: none  Inpatient Diabetes Program Recommendations:    Last A1C from 03/2017 was 5.0%, consider repeating A1C?  Would recommend initiating ICU Glycemic control order set phase 1, and place insulin orders for Novolog 0-9 units Q4H.   Patient could benefit from basal insulin given pressors. Consider Lantus 12 units QD.  Thanks, Bronson Curb, MSN, RNC-OB Diabetes Coordinator [phone] (8a-5p)

## 2018-06-12 NOTE — Progress Notes (Signed)
Initial Nutrition Assessment  DOCUMENTATION CODES:   Obesity unspecified  INTERVENTION:   If unable to extubate within 24-48 hours, recommend initiation of tube feeding when medically able  Tube Feeding:  Vital High Protein @ 40 ml/hr Pro-Stat 30 mL daily Provides 100 g of protein, 1060 kcals, 806 mL of free water   NUTRITION DIAGNOSIS:   Inadequate oral intake related to acute illness as evidenced by NPO status.  GOAL:   Patient will meet greater than or equal to 90% of their needs  MONITOR:   TF tolerance, Vent status, Labs, Weight trends  REASON FOR ASSESSMENT:   Ventilator    ASSESSMENT:   57 yo female admitted with AMS, intubated for airway protection. Pt with hx of substance abuse, stroke, chronic pain, diarrhea  Pt remains on vent support CT abdomen concerning for pan colitis  Pt's sister and pt's best friend at bedside. They report pt has not been eating well for a while. They report pt eating bites for the past week but indicate  Pt's friend indicates that the day of admission, pt call her and stated that she had severe abdominal pain. Per report, pt has hx of chronic diarrhea, takes imodium frequently No significant weight loss per weight encounters  Labs: sodium 133, potassium <2.0, phosphorus 2.3, CBGs 243-285 Meds: NS at 100 ml/hr, thiamine, mag ox, folic acid, KCl  NUTRITION - FOCUSED PHYSICAL EXAM:    Most Recent Value  Orbital Region  No depletion  Upper Arm Region  No depletion  Thoracic and Lumbar Region  No depletion  Buccal Region  No depletion  Temple Region  No depletion  Clavicle Bone Region  No depletion  Clavicle and Acromion Bone Region  No depletion  Scapular Bone Region  No depletion  Dorsal Hand  No depletion  Patellar Region  No depletion  Anterior Thigh Region  No depletion  Posterior Calf Region  No depletion  Edema (RD Assessment)  Mild       Diet Order:   Diet Order           Diet NPO time specified  Diet  effective now          EDUCATION NEEDS:   No education needs have been identified at this time  Skin:  Skin Assessment: Reviewed RN Assessment  Last BM:  6/22  Height:   Ht Readings from Last 1 Encounters:  06/11/18 5' 2"  (1.575 m)    Weight:   Wt Readings from Last 1 Encounters:  06/12/18 169 lb 15.6 oz (77.1 kg)    Ideal Body Weight:     BMI:  Body mass index is 31.09 kg/m.  Estimated Nutritional Needs:   Kcal:  1078 kcals  Protein:  100 g   Fluid:  >/= 1.5 L    Kerman Passey MS, RD, LDN, CNSC [phone] Pager  [phone] Weekend/On-Call Pager

## 2018-06-12 NOTE — Progress Notes (Signed)
[facility] Progress Note Patient Name: Deborah Russo DOB: 12/20/1961 MRN: [id number]   Date of Service  06/12/2018  HPI/Events of Note  Hyperglycemia - Blood glucose = 294 --> 298 --> 302. Currently on Q 4 hour standard Novolog SSI.  eICU Interventions  Will change to Q 4 hour moderate Novolog SSI.     Intervention Category Major Interventions: Hyperglycemia - active titration of insulin therapy  Lysle Dingwall 06/12/2018, 7:55 PM

## 2018-06-12 NOTE — Progress Notes (Signed)
CRITICAL VALUE ALERT  Critical Value:  K+=<2.0 and troponin 1.20  Date & Time Notied:  06/12/2018 0612  Provider Notified: Warren Lacy   Orders Received/Actions taken: Awaiting new orders

## 2018-06-12 NOTE — Procedures (Signed)
Extubation Procedure Note  Patient Details:   Name: CAMY LEDER DOB: 06/06/1961 MRN: [id number]   Airway Documentation:    Vent end date: 06/12/18 Vent end time: 1800   Evaluation  O2 sats: stable throughout Complications: No apparent complications Patient did not tolerate procedure well. Bilateral Breath Sounds: Clear, Diminished   Yes  Gonzella Lex 06/12/2018, 6:07 PM

## 2018-06-12 NOTE — Progress Notes (Signed)
[facility] Progress Note Patient Name: Deborah Russo DOB: 1961-03-21 MRN: [id number]   Date of Service  06/12/2018  HPI/Events of Note  K+ < 2.0, Ca++ = 7.3, PO4--- = 2.3, Mg++ = 1.8 and Creatinine = 0.96.  eICU Interventions  Will replace K+, PO4---, Mg++ and Ca++.     Intervention Category Major Interventions: Electrolyte abnormality - evaluation and management  Quantay Zaremba [name] 06/12/2018, 6:24 AM

## 2018-06-12 NOTE — Progress Notes (Signed)
[facility] Progress Note Patient Name: NEHA WAIGHT DOB: January 08, 1961 MRN: [id number]   Date of Service  06/12/2018  HPI/Events of Note  Troponin = 1.17 --> 1.2. Already on ASA. Demand ischemia?  eICU Interventions  Continue to trend Troponin.      Intervention Category Intermediate Interventions: Diagnostic test evaluation  Sommer,Steven [name] 06/12/2018, 2:03 AM

## 2018-06-12 NOTE — Progress Notes (Signed)
[facility] Progress Note Patient Name: Deborah Russo DOB: 1961/01/14 MRN: [id number]   Date of Service  06/12/2018  HPI/Events of Note  Multiple issues: 1. Agitation - Request for bilateral soft wrist restraints and 2. Nausea - QTc = 0.56  eICU Interventions  Will order: 1. Phenergan 12.5 mg IV Q 6 hours PRN nausea.      Intervention Category Major Interventions: Other: Minor Interventions: Agitation / anxiety - evaluation and management  Maddi Collar [name] 06/12/2018, 4:12 AM

## 2018-06-13 LAB — BASIC METABOLIC PANEL
ANION GAP: 9 (ref 5–15)
Anion gap: 8 (ref 5–15)
BUN: <5 mg/dL — ABNORMAL LOW (ref 6–20)
CHLORIDE: 102 mmol/L (ref 98–111)
CO2: 24 mmol/L (ref 22–32)
CO2: 25 mmol/L (ref 22–32)
Calcium: 7.8 mg/dL — ABNORMAL LOW (ref 8.9–10.3)
Calcium: 7.6 mg/dL — ABNORMAL LOW (ref 8.9–10.3)
Chloride: 100 mmol/L (ref 98–111)
Creatinine, Ser: 0.99 mg/dL (ref 0.44–1.00)
Creatinine, Ser: 0.95 mg/dL (ref 0.44–1.00)
GFR calc Af Amer: >60 mL/min (ref >60)
GFR calc Af Amer: >60 mL/min (ref >60)
GFR calc non Af Amer: >60 mL/min (ref >60)
GFR calc non Af Amer: >60 mL/min (ref >60)
GLUCOSE: 148 mg/dL — AB (ref 70–99)
GLUCOSE: 99 mg/dL (ref 70–99)
POTASSIUM: 2.5 mmol/L — AB (ref 3.5–5.1)
POTASSIUM: 2.8 mmol/L — AB (ref 3.5–5.1)
SODIUM: 135 mmol/L (ref 135–145)
Sodium: 133 mmol/L — ABNORMAL LOW (ref 135–145)

## 2018-06-13 LAB — C DIFFICILE QUICK SCREEN W PCR REFLEX
C DIFFICILE (CDIFF) INTERP: NOT DETECTED
C DIFFICILE (CDIFF) TOXIN: NEGATIVE
C Diff antigen: NEGATIVE

## 2018-06-13 LAB — GLUCOSE, CAPILLARY
GLUCOSE-CAPILLARY: 166 mg/dL — AB (ref 70–99)
Glucose-Capillary: 103 mg/dL — ABNORMAL HIGH (ref 70–99)
Glucose-Capillary: 124 mg/dL — ABNORMAL HIGH (ref 70–99)
Glucose-Capillary: 128 mg/dL — ABNORMAL HIGH (ref 70–99)
Glucose-Capillary: 133 mg/dL — ABNORMAL HIGH (ref 70–99)
Glucose-Capillary: 91 mg/dL (ref 70–99)

## 2018-06-13 LAB — URINE CULTURE: Culture: NO GROWTH

## 2018-06-13 LAB — NA AND K (SODIUM & POTASSIUM), RAND UR
Potassium Urine: 25 mmol/L
SODIUM UR: 114 mmol/L

## 2018-06-13 LAB — MAGNESIUM: Magnesium: 2 mg/dL (ref 1.7–2.4)

## 2018-06-13 LAB — OSMOLALITY, URINE: Osmolality, Ur: 306 mOsm/kg (ref 300–900)

## 2018-06-13 LAB — TSH: TSH: 80.278 u[IU]/mL — ABNORMAL HIGH (ref 0.350–4.500)

## 2018-06-13 MED ORDER — POTASSIUM CHLORIDE CRYS ER 20 MEQ PO TBCR
40.0000 meq | EXTENDED_RELEASE_TABLET | Freq: Three times a day (TID) | ORAL | Status: DC
  Administered 2018-06-13: 40 meq via ORAL
  Filled 2018-06-13: qty 2

## 2018-06-13 MED ORDER — POTASSIUM CHLORIDE 10 MEQ/50ML IV SOLN
10.0000 meq | INTRAVENOUS | Status: DC
  Administered 2018-06-13 (×6): 10 meq via INTRAVENOUS
  Filled 2018-06-13 (×5): qty 50

## 2018-06-13 MED ORDER — PANTOPRAZOLE SODIUM 40 MG PO TBEC
40.0000 mg | DELAYED_RELEASE_TABLET | Freq: Two times a day (BID) | ORAL | Status: DC
  Administered 2018-06-13 – 2018-06-15 (×5): 40 mg via ORAL
  Filled 2018-06-13 (×5): qty 1

## 2018-06-13 MED ORDER — POTASSIUM CHLORIDE 2 MEQ/ML IV SOLN
INTRAVENOUS | Status: DC
  Administered 2018-06-13 – 2018-06-15 (×3): via INTRAVENOUS
  Filled 2018-06-13 (×7): qty 1000

## 2018-06-13 MED ORDER — FAMOTIDINE 20 MG PO TABS
40.0000 mg | ORAL_TABLET | Freq: Two times a day (BID) | ORAL | Status: DC
  Administered 2018-06-13 – 2018-06-15 (×5): 40 mg via ORAL
  Filled 2018-06-13 (×5): qty 2

## 2018-06-13 MED ORDER — SODIUM CHLORIDE 0.9 % IV BOLUS
1000.0000 mL | Freq: Once | INTRAVENOUS | Status: AC
  Administered 2018-06-13: 1000 mL via INTRAVENOUS

## 2018-06-13 MED ORDER — POTASSIUM CHLORIDE CRYS ER 20 MEQ PO TBCR
40.0000 meq | EXTENDED_RELEASE_TABLET | ORAL | Status: DC
  Administered 2018-06-13 – 2018-06-14 (×3): 40 meq via ORAL
  Filled 2018-06-13 (×3): qty 2

## 2018-06-13 MED ORDER — CHOLESTYRAMINE 4 G PO PACK
4.0000 g | PACK | Freq: Three times a day (TID) | ORAL | Status: DC | PRN
  Administered 2018-06-13: 4 g via ORAL
  Filled 2018-06-13 (×3): qty 1

## 2018-06-13 MED ORDER — LOPERAMIDE HCL 2 MG PO CAPS
4.0000 mg | ORAL_CAPSULE | ORAL | Status: DC | PRN
  Administered 2018-06-14: 4 mg via ORAL
  Filled 2018-06-13: qty 2

## 2018-06-13 MED ORDER — CHLORHEXIDINE GLUCONATE 0.12 % MT SOLN
15.0000 mL | Freq: Two times a day (BID) | OROMUCOSAL | Status: DC
  Administered 2018-06-13 – 2018-06-21 (×14): 15 mL via OROMUCOSAL
  Filled 2018-06-13 (×15): qty 15

## 2018-06-13 MED ORDER — ORAL CARE MOUTH RINSE
15.0000 mL | Freq: Two times a day (BID) | OROMUCOSAL | Status: DC
  Administered 2018-06-13 – 2018-06-23 (×10): 15 mL via OROMUCOSAL

## 2018-06-13 NOTE — Progress Notes (Signed)
[facility] Progress Note Patient Name: Deborah Russo DOB: 1961/09/27 MRN: [id number]   Date of Service  06/13/2018  HPI/Events of Note  K+ = 2.5 and Creatinine = 0.99.  eICU Interventions  Will replace K+.     Intervention Category Major Interventions: Electrolyte abnormality - evaluation and management  Sommer,Steven [name] 06/13/2018, 5:06 AM

## 2018-06-13 NOTE — Progress Notes (Signed)
Follow up - Critical Care Medicine Note  Patient Details:    Deborah Russo is an 57 y.o. female.  She was admitted June 23 for altered mental status thought to be due to polysubstance abuse.  Had a similar presentation one year ago.  Was also found to be hypotensive. Was intubated for airway protection and received fluid resuscitation.  Past Medical History:  Diagnosis Date  . Chronic abdominal pain    since approximately 1991  . Chronic migraine   . Chronic pain   . Hypothyroidism   . Nausea, vomiting, and diarrhea    recurrent, chronic  . Pain management   . Sleep apnea    has CPAP, but doesn't wear it bc she says "i can't sleep with it on"  . Stroke [facility])       Lines, Airways, Drains: CVC Triple Lumen 06/11/18 Right Femoral (Active)  Indication for Insertion or Continuance of Line Vasoactive infusions;Limited venous access - need for IV therapy >5 days (PICC only);Poor Vasculature-patient has had multiple peripheral attempts or PIVs lasting less than 24 hours 06/13/2018  7:45 AM  Site Assessment Clean;Dry;Intact 06/13/2018  7:45 AM  Proximal Lumen Status Infusing 06/13/2018  7:45 AM  Medial Lumen Status Infusing 06/13/2018  7:45 AM  Distal Lumen Status Flushed;In-line blood sampling system in place 06/13/2018  7:45 AM  Dressing Type Transparent;Occlusive 06/13/2018  7:45 AM  Dressing Status Clean;Dry;Intact;Antimicrobial disc in place 06/13/2018  7:45 AM  Line Care Connections checked and tightened 06/13/2018  7:45 AM  Dressing Change Due 06/18/18 06/13/2018  7:45 AM     Urethral Catheter (Active)  Indication for Insertion or Continuance of Catheter Unstable critical patients (first 24-48 hours) 06/13/2018  8:00 AM  Site Assessment Clean;Intact 06/13/2018  8:00 AM  Catheter Maintenance Bag below level of bladder;Catheter secured;Drainage bag/tubing not touching floor;Insertion date on drainage bag;No dependent loops;Seal intact;Bag emptied prior to transport 06/13/2018  8:00 AM   Collection Container Standard drainage bag 06/13/2018  8:00 AM  Securement Method Leg strap 06/13/2018  8:00 AM  Urinary Catheter Interventions Unclamped 06/13/2018  8:00 AM  Output (mL) 137 mL 06/13/2018  8:00 AM    Anti-infectives:  Anti-infectives (From admission, onward)   Start     Dose/Rate Route Frequency Ordered Stop   06/12/18 0000  vancomycin (VANCOCIN) IVPB 1000 mg/200 mL premix     1,000 mg 200 mL/hr over 60 Minutes Intravenous Every 12 hours 06/11/18 1419     06/11/18 2100  piperacillin-tazobactam (ZOSYN) IVPB 3.375 g     3.375 g 12.5 mL/hr over 240 Minutes Intravenous Every 8 hours 06/11/18 1419     06/11/18 1300  vancomycin (VANCOCIN) 1,500 mg in sodium chloride 0.9 % 500 mL IVPB     1,500 mg 250 mL/hr over 120 Minutes Intravenous  Once 06/11/18 1143 06/11/18 1444   06/11/18 1145  piperacillin-tazobactam (ZOSYN) IVPB 3.375 g     3.375 g 100 mL/hr over 30 Minutes Intravenous  Once 06/11/18 1137 06/11/18 1230   06/11/18 1145  vancomycin (VANCOCIN) IVPB 1000 mg/200 mL premix  Status:  Discontinued     1,000 mg 200 mL/hr over 60 Minutes Intravenous  Once 06/11/18 1137 06/11/18 1142      Microbiology: Results for orders placed or performed during the hospital encounter of 06/11/18  Blood Culture (routine x 2)     Status: None (Preliminary result)   Collection Time: 06/11/18 11:41 AM  Result Value Ref Range Status   Specimen Description BLOOD LEFT ARM  Final  Special Requests   Final    BOTTLES DRAWN AEROBIC ONLY Blood Culture adequate volume   Culture   Final    NO GROWTH 2 DAYS Performed at [facility], [address], [place], [place] [address]    Report Status PENDING  Incomplete  Blood Culture (routine x 2)     Status: None (Preliminary result)   Collection Time: 06/11/18 12:36 PM  Result Value Ref Range Status   Specimen Description BLOOD RIGHT HAND  Final   Special Requests   Final    BOTTLES DRAWN AEROBIC AND ANAEROBIC Blood Culture adequate volume    Culture   Final    NO GROWTH 2 DAYS Performed at [facility], [address] [place], [place] [address]    Report Status PENDING  Incomplete  MRSA PCR Screening     Status: None   Collection Time: 06/11/18  6:34 PM  Result Value Ref Range Status   MRSA by PCR NEGATIVE NEGATIVE Final    Comment:        The GeneXpert MRSA Assay (FDA approved for NASAL specimens only), is one component of a comprehensive MRSA colonization surveillance program. It is not intended to diagnose MRSA infection nor to guide or monitor treatment for MRSA infections. Performed at [facility], [place] [address] [place], [place] [address]   Culture, blood (routine x 2)     Status: None (Preliminary result)   Collection Time: 06/11/18  7:40 PM  Result Value Ref Range Status   Specimen Description BLOOD RIGHT HAND  Final   Special Requests   Final    BOTTLES DRAWN AEROBIC ONLY Blood Culture adequate volume   Culture   Final    NO GROWTH < 24 HOURS Performed at [facility], [place] [address], [place], [place] [address]    Report Status PENDING  Incomplete  Culture, blood (routine x 2)     Status: None (Preliminary result)   Collection Time: 06/11/18  7:45 PM  Result Value Ref Range Status   Specimen Description BLOOD RIGHT ANTECUBITAL  Final   Special Requests   Final    BOTTLES DRAWN AEROBIC ONLY Blood Culture adequate volume   Culture   Final    NO GROWTH < 24 HOURS Performed at [facility], [place] [address] [place], [place] [address]    Report Status PENDING  Incomplete  Culture, respiratory (tracheal aspirate)     Status: None (Preliminary result)   Collection Time: 06/11/18 10:57 PM  Result Value Ref Range Status   Specimen Description TRACHEAL ASPIRATE  Final   Special Requests NONE  Final   Gram Stain   Final    WBC PRESENT, PREDOMINANTLY PMN NO ORGANISMS SEEN FEW GRAM POSITIVE RODS FEW YEAST Performed at [facility], [place] [address] [place], [place] [address]     Culture PENDING  Incomplete   Report Status PENDING  Incomplete    Best Practice/Protocols:  VTE Prophylaxis: Lovenox (prophylaxtic dose)   Events:  Extubated 06/12/2018  Studies: Ct Head Wo Contrast  Result Date: 06/11/2018 CLINICAL DATA:  Altered mental status. EXAM: CT HEAD WITHOUT CONTRAST TECHNIQUE: Contiguous axial images were obtained from the base of the skull through the vertex without intravenous contrast. COMPARISON:  01/23/2018 FINDINGS: Brain: No evidence of acute infarction, hemorrhage, hydrocephalus, extra-axial collection or mass lesion/mass effect. There is sulcal enlargement consistent with mild atrophy. Old infarct is noted at the junction of the posterior left frontal and left parietal lobe  superiorly, stable from the prior exam. Vascular: No hyperdense vessel or unexpected calcification. Skull: Normal. Negative for fracture or focal lesion. Sinuses/Orbits: Visualized globes and orbits are unremarkable. The visualized sinuses and mastoid air cells are clear. Other: None. IMPRESSION: 1. No acute intracranial abnormalities. 2. Mild atrophy.  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 Electronically Signed   By: Lajean Manes M.D.   On: 06/11/2018 12:51   Ct Abdomen Pelvis W Contrast  Result Date: 06/11/2018 CLINICAL DATA:  57 year old female with history of altered mental status. Possible abdominal mass noted on physical examination. EXAM: CT ABDOMEN AND PELVIS WITH CONTRAST TECHNIQUE: Multidetector CT imaging of the abdomen and pelvis was performed using the standard protocol following bolus administration of intravenous contrast. CONTRAST:  162m ISOVUE-300 IOPAMIDOL (ISOVUE-300) INJECTION 61% COMPARISON:  CT the abdomen and pelvis 07/13/2017. FINDINGS: Lower chest: Unremarkable. Hepatobiliary: Diffuse low attenuation throughout the hepatic parenchyma, indicative of severe hepatic steatosis. Liver also has a shrunken appearance and nodular contour, indicative of a background of  cirrhosis. No definite cystic or solid hepatic lesions. No intra or extrahepatic biliary ductal dilatation. Small amount of pneumobilia in the left lobe of the liver, indicative of prior sphincterotomy. Status post cholecystectomy. Pancreas: No pancreatic mass. No pancreatic ductal dilatation. No pancreatic or peripancreatic fluid or inflammatory changes. Spleen: Unremarkable. Adrenals/Urinary Tract: Bilateral kidneys and bilateral adrenal glands are normal in appearance. No hydroureteronephrosis. Foley balloon catheter present in the lumen of the urinary bladder. Urinary bladder is nearly decompressed, but otherwise unremarkable in appearance. Stomach/Bowel: Normal appearance of the stomach. No pathologic dilatation of small bowel or colon. Severe mural thickening throughout the entire colon and rectum with surrounding inflammatory changes in the adjacent mesocolon and mesorectum, indicative of severe pancolitis. Normal appendix. Vascular/Lymphatic: Aortic atherosclerosis, without evidence of aneurysm or dissection in the abdominal or pelvic vasculature. High-grade stenosis or complete occlusion of the left common iliac artery with distal reconstitution of flow related to collateral pathways. No lymphadenopathy noted in the abdomen or pelvis. Reproductive: Status post hysterectomy. Ovaries are not confidently identified may be surgically absent or atrophic. Other: Trace volume of ascites.  No pneumoperitoneum. Musculoskeletal: Chronic compression fracture of L4 with approximately 20% loss of anterior vertebral body height, unchanged. There are no aggressive appearing lytic or blastic lesions noted in the visualized portions of the skeleton. IMPRESSION: 1. Severe pancolitis. Clinical correlation for signs and symptoms of C difficile colitis is suggested. 2. Hepatic steatosis with evidence of cirrhosis. 3. Aortic atherosclerosis, including high-grade stenosis or occlusion of the left common iliac artery. 4.  Additional incidental findings, as above. Electronically Signed   By: DVinnie LangtonM.D.   On: 06/11/2018 12:36   Dg Chest Port 1 View  Result Date: 06/11/2018 CLINICAL DATA:  Intubated. EXAM: PORTABLE CHEST 1 VIEW COMPARISON:  Earlier today. FINDINGS: The endotracheal tube has been advanced with its tip 5 mm above the carina. Nasogastric tube extending into the stomach. Mildly improved enlargement of the cardiac silhouette. Decreased prominence of the pulmonary vasculature and interstitial markings. Linear and small amount of ill-defined density at the left lung base. Diffuse osteopenia. IMPRESSION: 1. Endotracheal tube tip 5 mm above the carina. It is recommended that this be retracted 4 cm. 2. Improved cardiomegaly and changes of congestive heart failure. 3. Left basilar atelectasis and possible small amount of pleural fluid. Electronically Signed   By: SClaudie ReveringM.D.   On: 06/11/2018 19:19    Consults:    Subjective:    Overnight Issues: tolerated extubation. Remains  on low dose NE. + Appetite. Reports chronic upper abdominal pain for which she has been taking narcotics for many years following cholecystectomy.  Objective:  Vital signs for last 24 hours: Temp:  [99.1 F (37.3 C)-100.2 F (37.9 C)] 99.3 F (37.4 C) (06/25 0815) Pulse Rate:  [54-80] 67 (06/25 0815) Resp:  [2-21] 19 (06/25 0815) BP: (96-136)/(63-81) 110/74 (06/25 0800) SpO2:  [97 %-100 %] 99 % (06/25 0815) FiO2 (%):  [40 %] 40 % (06/24 1556) Weight:  [186 lb 1.1 oz (84.4 kg)] 186 lb 1.1 oz (84.4 kg) (06/25 0341)  Hemodynamic parameters for last 24 hours:  Remains of NE.  Intake/Output from previous day: 06/24 0701 - 06/25 0700 In: 7467 [I.V.:2762.5; IV Piggyback:4704.4] Out: 2987 [ASTMH:9622; Emesis/NG output:500]  Intake/Output this shift: Total I/O In: 119.7 [I.V.:119.7] Out: 137 [Urine:137]  Vent settings for last 24 hours: Vent Mode: PSV;CPAP FiO2 (%):  [40 %] 40 % Set Rate:  [20 bmp] 20 bmp Vt  Set:  [400 mL] 400 mL PEEP:  [5 cmH20] 5 cmH20 Pressure Support:  [10 cmH20] 10 cmH20 Plateau Pressure:  [12 cmH20] 12 cmH20  Physical Exam:  General: alert and no respiratory distress Neuro: alert, oriented and nonfocal exam HEENT/Neck: no JVD Resp: clear to auscultation bilaterally CVS: murmur GI: distended Skin: no rash Extremities: no edema, no erythema, pulses WNL  Assessment/Plan:   NEURO  No delirium. Ill-defined chronic pain syndrome. Chronic narcotic use may be contributing to current admission.    Plan: Limit narcotic and attempt to make firm diagnosis   PULM  Repiratory failure has resolved.    Plan: progressive ambulation.  CARDIO  Hypotension requiring NE to maintain MAP. Etiology unclear.   Plan: Continue to wean pressors. Evaluate for adrenal insufficiency.  RENAL  Persistent hypokalemia and volume contraction of unclear etiology.   Plan: Nephrology consulltation.  GI  Colitis on CT with history of chronic epigastric pain.    Plan: GI consultation.  ID  Colitis of possible ischemic vs infectious etiology. Borderline PCT.   Plan: Discontinue antibiotics if PCT continues to decline.  HEME  Leukocytosis (reactive.)   Plan: no indication for transfusion.   ENDO adequate glycemic control.   Plan: continue current insulin regimen.  Global Issues   Improving but underlying disease process still unclear.    LOS: 2 days   Additional comments:None  Critical Care Total Time*: 40  Loriene Taunton 06/13/2018  *Care during the described time interval was provided by me and/or other providers on the critical care team.  I have reviewed this patient's available data, including medical history, events of note, physical examination and test results as part of my evaluation.

## 2018-06-13 NOTE — Consult Note (Signed)
[facility] Nephrology Consultation Note  Requesting MD: Dr. Lynetta Mare (ICU) Reason for consult: Hypokalemia  HPI:  Deborah Russo is a 57 y.o. female with history of hypertension, chronic migraine, chronic abdominal pain, substance abuse, hypothyroidism, noncompliant with medication who was admitted for altered mental status in the setting of polysubstance abuse and unintentional overdose.  The patient initially went to ER at outside hospital where she was found to be hypotensive required a central line and she was intubated for airway protection.  The patient was stabilized and [facility]. On admission patient was found to have serum potassium level less than 2, calcium 5.3, albumin 2, magnesium 1.3 and phosphorus less than 1.  Patient was not acidotic. She was started on Levophed for hypotension.  She was repleted with electrolytes including potassium and magnesium however the serum potassium level still remains low therefore nephrology was consulted.  Patient is stated that she has 4-5 episodes of diarrhea which was confirmed with the patient's nurse at bedside.  She has chronic abdominal pain and reported diarrhea for last few weeks.  Also reports decreased oral intake.  Patient reported not taking any medications including Synthroid.  She has normal serum creatinine level.  Patient denied chest pain, shortness of breath, headache or dizziness.  She reported abdominal pain and asking for pain medication.  Denies dysuria, urgency or frequency.  Urine toxicology positive for benzo, opiates and cannabinoid.  Creatinine, Ser  Date/Time Value Ref Range Status  06/13/2018 03:49 AM 0.99 0.44 - 1.00 mg/dL Final  06/12/2018 01:30 PM 0.96 0.44 - 1.00 mg/dL Final  06/12/2018 04:52 AM 0.96 0.44 - 1.00 mg/dL Final  06/11/2018 09:14 PM 0.97 0.44 - 1.00 mg/dL Final  06/11/2018 12:38 PM 0.75 0.44 - 1.00 mg/dL Final  06/17/2017 06:01 AM 0.72 0.44 - 1.00 mg/dL  Final  06/16/2017 06:02 PM 0.89 0.44 - 1.00 mg/dL Final  05/24/2017 08:03 AM 0.59 0.44 - 1.00 mg/dL Final  04/27/2017 09:38 AM 0.73 0.44 - 1.00 mg/dL Final  04/03/2017 10:30 AM 0.67 0.44 - 1.00 mg/dL Final  04/01/2017 05:34 AM 0.81 0.44 - 1.00 mg/dL Final  03/29/2017 06:32 AM 0.63 0.44 - 1.00 mg/dL Final  10/04/2016 05:53 PM 0.88 0.44 - 1.00 mg/dL Final  09/12/2016 09:14 AM 1.02 (H) 0.44 - 1.00 mg/dL Final  08/08/2016 05:50 PM 0.80 0.44 - 1.00 mg/dL Final  03/17/2016 08:22 AM 0.74 0.44 - 1.00 mg/dL Final  09/02/2015 08:13 PM 1.07 (H) 0.44 - 1.00 mg/dL Final  09/02/2015 05:10 PM 1.11 (H) 0.44 - 1.00 mg/dL Final  02/19/2015 02:49 PM 1.53 (H) 0.50 - 1.10 mg/dL Final  08/15/2014 09:15 PM 0.71 0.50 - 1.10 mg/dL Final  07/17/2014 06:19 AM 0.79 0.50 - 1.10 mg/dL Final  07/14/2014 06:10 AM 0.63 0.50 - 1.10 mg/dL Final  07/13/2014 03:44 PM 0.59 0.50 - 1.10 mg/dL Final  07/01/2014 08:28 AM 0.63 0.50 - 1.10 mg/dL Final  06/27/2014 09:10 AM 0.65 0.50 - 1.10 mg/dL Final  06/22/2014 06:35 AM 0.68 0.50 - 1.10 mg/dL Final  06/21/2014 07:39 AM 0.70 0.50 - 1.10 mg/dL Final  06/20/2014 05:27 AM 0.67 0.50 - 1.10 mg/dL Final  06/19/2014 03:24 PM 0.66 0.50 - 1.10 mg/dL Final  05/01/2014 03:42 PM 0.87 0.50 - 1.10 mg/dL Final  04/08/2014 09:55 AM 1.33 (H) 0.50 - 1.10 mg/dL Final  09/19/2013 05:13 AM 0.91 0.50 - 1.10 mg/dL Final  09/18/2013 04:42 PM 0.89 0.50 - 1.10 mg/dL Final     PMHx:   Past  Medical History:  Diagnosis Date  . Chronic abdominal pain    since approximately 1991  . Chronic migraine   . Chronic pain   . Hypothyroidism   . Nausea, vomiting, and diarrhea    recurrent, chronic  . Pain management   . Sleep apnea    has CPAP, but doesn't wear it bc she says "i can't sleep with it on"  . Stroke [facility])     Past Surgical History:  Procedure Laterality Date  . ABDOMINAL HYSTERECTOMY    . ABDOMINAL SURGERY    . BIOPSY N/A 07/17/2014   Procedure: BIOPSY;  Surgeon: Rogene Houston, MD;   Location: [facility];  Service: Endoscopy;  Laterality: N/A;  . BREAST SURGERY Left    removed nipple  . CESAREAN SECTION    . CHOLECYSTECTOMY    . ESOPHAGOGASTRODUODENOSCOPY N/A 07/17/2014   Procedure: ESOPHAGOGASTRODUODENOSCOPY (EGD);  Surgeon: Rogene Houston, MD;  Location: [facility];  Service: Endoscopy;  Laterality: N/A;    Family Hx:  Family History  Problem Relation Age of Onset  . Asthma Mother   . Cancer Father     Social History:  reports that she has been smoking cigarettes.  She has a 41.00 pack-year smoking history. She has never used smokeless tobacco. She reports that she has current or past drug history. Drugs: Marijuana and Oxycodone. Frequency: 2.00 times per week. She reports that she does not drink alcohol.  Allergies:  Allergies  Allergen Reactions  . Penicillins Hives    *Tolerates zosyn Has patient had a PCN reaction causing immediate rash, facial/tongue/throat swelling, SOB or lightheadedness with hypotension: No Has patient had a PCN reaction causing severe rash involving mucus membranes or skin necrosis: No Has patient had a PCN reaction that required hospitalization No Has patient had a PCN reaction occurring within the last 10 years: No If all of the above answers are "NO", then may proceed with Cephalosporin use.   . Sulfa Antibiotics Hives    Medications: Prior to Admission medications   Medication Sig Start Date End Date Taking? Authorizing Provider  amitriptyline (ELAVIL) 10 MG tablet Take 30 mg by mouth at bedtime. 05/22/18  Yes [provider]  baclofen (LIORESAL) 10 MG tablet Take 10-20 mg by mouth 3 (three) times daily as needed for muscle spasms. 06/07/18  Yes [provider]  diazepam (VALIUM) 5 MG tablet Take 0.5 tablets (2.5 mg total) by mouth 3 (three) times daily. Patient taking differently: Take 5 mg by mouth 3 (three) times daily.  06/17/17  Yes Janece Canterbury, MD  oxyCODONE (OXYCONTIN) 80 mg 12 hr tablet Take 1 tablet (80  mg total) by mouth every 12 (twelve) hours. 04/01/17  Yes Orvan Falconer, MD  oxyCODONE-acetaminophen (PERCOCET) 10-325 MG tablet Take 1 tablet by mouth 2 (two) times daily as needed (breakthrough pain).    Yes [provider]  aspirin 325 MG tablet Take 1 tablet (325 mg total) by mouth daily. 04/02/17   Orvan Falconer, MD  famotidine (PEPCID) 20 MG tablet Take 1 tablet (20 mg total) by mouth 2 (two) times daily. Patient not taking: Reported on 06/12/2018 04/27/17   Julianne Rice, MD  levothyroxine (SYNTHROID, LEVOTHROID) 200 MCG tablet TAKE 1 TABLET DAILY Patient not taking: Reported on 06/12/2018 09/07/17   Eustaquio Maize, MD  lisinopril (PRINIVIL,ZESTRIL) 10 MG tablet TAKE 1 TABLET DAILY Patient not taking: Reported on 06/12/2018 03/14/17   Eustaquio Maize, MD  omeprazole (PRILOSEC) 20 MG capsule TAKE 1 TABLET DAILY Patient  not taking: Reported on 06/12/2018 02/08/17   Eustaquio Maize, MD  pantoprazole (PROTONIX) 20 MG tablet TAKE 1 TABLET ONCE DAILY Patient not taking: Reported on 06/12/2018 09/07/17   Eustaquio Maize, MD  pravastatin (PRAVACHOL) 20 MG tablet TAKE 1 TABLET DAILY Patient not taking: Reported on 06/12/2018 03/14/17   Eustaquio Maize, MD  senna-docusate (SENOKOT-S) 8.6-50 MG tablet Take 1 tablet by mouth at bedtime as needed for mild constipation. 04/01/17   Orvan Falconer, MD  sucralfate (CARAFATE) 1 g tablet TAKE  (1)  TABLET  THREE TIMES DAILY AS NEEDED. Patient not taking: Reported on 06/12/2018 01/08/17   Eustaquio Maize, MD    I have reviewed the patient's current medications.  Labs:  Results for orders placed or performed during the hospital encounter of 06/11/18 (from the past 48 hour(s))  Lactic acid, plasma     Status: Abnormal   Collection Time: 06/11/18  4:51 PM  Result Value Ref Range   Lactic Acid, Venous 3.0 (HH) 0.5 - 1.9 mmol/L    Comment: CRITICAL RESULT CALLED TO, READ BACK BY AND VERIFIED WITH: CARDWELL,L @ 1806 ON 06/11/18 BY JUW Performed at [facility],  [address] [place], [place] [address]   Glucose, capillary     Status: Abnormal   Collection Time: 06/11/18  6:29 PM  Result Value Ref Range   Glucose-Capillary 197 (H) 65 - 99 mg/dL  MRSA PCR Screening     Status: None   Collection Time: 06/11/18  6:34 PM  Result Value Ref Range   MRSA by PCR NEGATIVE NEGATIVE    Comment:        The GeneXpert MRSA Assay (FDA approved for NASAL specimens only), is one component of a comprehensive MRSA colonization surveillance program. It is not intended to diagnose MRSA infection nor to guide or monitor treatment for MRSA infections. Performed at [facility], [place] [address] [place], [place] [address]   Culture, blood (routine x 2)     Status: None (Preliminary result)   Collection Time: 06/11/18  7:40 PM  Result Value Ref Range   Specimen Description BLOOD RIGHT HAND    Special Requests      BOTTLES DRAWN AEROBIC ONLY Blood Culture adequate volume   Culture      NO GROWTH 2 DAYS Performed at [facility], [place] [address], [place], [place] [address]    Report Status PENDING   Culture, blood (routine x 2)     Status: None (Preliminary result)   Collection Time: 06/11/18  7:45 PM  Result Value Ref Range   Specimen Description BLOOD RIGHT ANTECUBITAL    Special Requests      BOTTLES DRAWN AEROBIC ONLY Blood Culture adequate volume   Culture      NO GROWTH 2 DAYS Performed at [facility], [place] [address] [place], [place] [address]    Report Status PENDING   Glucose, capillary     Status: Abnormal   Collection Time: 06/11/18  7:58 PM  Result Value Ref Range   Glucose-Capillary 243 (H) 65 - 99 mg/dL  HIV antibody (Routine Testing)     Status: None   Collection Time: 06/11/18  9:14 PM  Result Value Ref Range   HIV Screen 4th Generation wRfx Non Reactive Non Reactive    Comment: (NOTE) Performed At: [facility] [place], [place] [id number] Rush Farmer MD [id number] Performed at [facility], [place],  [place] [address]   Comprehensive metabolic panel     Status: Abnormal   Collection Time: 06/11/18  9:14 PM  Result Value Ref Range   Sodium 131 (L) 135 - 145 mmol/L   Potassium <2.0 (LL) 3.5 - 5.1 mmol/L    Comment: REPEATED TO VERIFY CRITICAL RESULT CALLED TO, READ BACK BY AND VERIFIED WITH: WOODSON B,RN 06/11/18 2225 WAYK    Chloride 98 (L) 101 - 111 mmol/L   CO2 23 22 - 32 mmol/L   Glucose, Bld 324 (H) 65 - 99 mg/dL   BUN 6 6 - 20 mg/dL   Creatinine, Ser 0.97 0.44 - 1.00 mg/dL   Calcium 7.4 (L) 8.9 - 10.3 mg/dL   Total Protein 5.3 (L) 6.5 - 8.1 g/dL   Albumin 2.6 (L) 3.5 - 5.0 g/dL   AST 23 15 - 41 U/L   ALT 13 (L) 14 - 54 U/L   Alkaline Phosphatase 87 38 - 126 U/L   Total Bilirubin 0.8 0.3 - 1.2 mg/dL   GFR calc non Af Amer >60 >60 mL/min   GFR calc Af Amer >60 >60 mL/min    Comment: (NOTE) The eGFR has been calculated using the CKD EPI equation. This calculation has not been validated in all clinical situations. eGFR's persistently <60 mL/min signify possible Chronic Kidney Disease.    Anion gap 10 5 - 15    Comment: Performed at [facility] [address] [place], [place] [address]  Magnesium     Status: None   Collection Time: 06/11/18  9:14 PM  Result Value Ref Range   Magnesium 2.0 1.7 - 2.4 mg/dL    Comment: Performed at [facility], [place] [address], [place], [place] [address]  Phosphorus     Status: Abnormal   Collection Time: 06/11/18  9:14 PM  Result Value Ref Range   Phosphorus <1.0 (LL) 2.5 - 4.6 mg/dL    Comment: REPEATED TO VERIFY CRITICAL RESULT CALLED TO, READ BACK BY AND VERIFIED WITH: Ruben Gottron 06/11/18 2225 WAYK Performed at [facility], [place] [address], [place], [place] [address]   Lipase, blood     Status: None   Collection Time: 06/11/18  9:14 PM  Result Value Ref Range   Lipase 30 11 - 51 U/L    Comment: Performed at [facility], [place] [address] [place], [place] [address]   Lactic acid, plasma     Status: Abnormal   Collection Time: 06/11/18  9:14 PM  Result Value Ref Range   Lactic Acid, Venous 2.3 (HH) 0.5 - 1.9 mmol/L    Comment: CRITICAL RESULT CALLED TO, READ BACK BY AND VERIFIED WITH: Ruben Gottron 06/11/18 2214 WAYK Performed at [facility], [place] [address], [place], [place] [address]   Troponin I     Status: Abnormal   Collection Time: 06/11/18  9:14 PM  Result Value Ref Range   Troponin I 1.17 (HH) <0.03 ng/mL    Comment: CRITICAL RESULT CALLED TO, READ BACK BY AND VERIFIED WITH: Ruben Gottron 06/11/18 2242 WAYK Performed at [facility] [address], [place], [place] [address]   Procalcitonin     Status: None   Collection Time: 06/11/18  9:14 PM  Result Value Ref Range   Procalcitonin 0.12 ng/mL    Comment:        Interpretation: PCT (Procalcitonin) <= 0.5 ng/mL: Systemic infection (sepsis) is not likely. Local bacterial infection is possible. (NOTE)  Sepsis PCT Algorithm           Lower Respiratory Tract                                      Infection PCT Algorithm    ----------------------------     ----------------------------         PCT < 0.25 ng/mL                PCT < 0.10 ng/mL         Strongly encourage             Strongly discourage   discontinuation of antibiotics    initiation of antibiotics    ----------------------------     -----------------------------       PCT 0.25 - 0.50 ng/mL            PCT 0.10 - 0.25 ng/mL               OR       >80% decrease in PCT            Discourage initiation of                                            antibiotics      Encourage discontinuation           of antibiotics    ----------------------------     -----------------------------         PCT >= 0.50 ng/mL              PCT 0.26 - 0.50 ng/mL               AND        <80% decrease in PCT             Encourage initiation of                                             antibiotics       Encourage continuation            of antibiotics    ----------------------------     -----------------------------        PCT >= 0.50 ng/mL                  PCT > 0.50 ng/mL               AND         increase in PCT                  Strongly encourage                                      initiation of antibiotics    Strongly encourage escalation           of antibiotics                                     -----------------------------  PCT <= 0.25 ng/mL                                                 OR                                        > 80% decrease in PCT                                     Discontinue / Do not initiate                                             antibiotics Performed at [facility], [place] [address] [place], [place] [address]   Brain natriuretic peptide     Status: Abnormal   Collection Time: 06/11/18  9:14 PM  Result Value Ref Range   B Natriuretic Peptide 226.2 (H) 0.0 - 100.0 pg/mL    Comment: Performed at [facility] [address], [place], [place] [address]  CBC WITH DIFFERENTIAL     Status: Abnormal   Collection Time: 06/11/18  9:14 PM  Result Value Ref Range   WBC 15.2 (H) 4.0 - 10.5 K/uL   RBC 2.68 (L) 3.87 - 5.11 MIL/uL   Hemoglobin 9.9 (L) 12.0 - 15.0 g/dL   HCT 27.1 (L) 36.0 - 46.0 %   MCV 101.1 (H) 78.0 - 100.0 fL   MCH 36.9 (H) 26.0 - 34.0 pg   MCHC 36.5 (H) 30.0 - 36.0 g/dL   RDW 14.1 11.5 - 15.5 %   Platelets 90 (L) 150 - 400 K/uL    Comment: REPEATED TO VERIFY SPECIMEN CHECKED FOR CLOTS PLATELET COUNT CONFIRMED BY SMEAR    Neutrophils Relative % 77 %   Neutro Abs 11.7 (H) 1.7 - 7.7 K/uL   Lymphocytes Relative 18 %   Lymphs Abs 2.8 0.7 - 4.0 K/uL   Monocytes Relative 3 %   Monocytes Absolute 0.5 0.1 - 1.0 K/uL   Eosinophils Relative 1 %   Eosinophils Absolute 0.1 0.0 - 0.7 K/uL   Basophils Relative 0 %   Basophils Absolute 0.0 0.0 - 0.1 K/uL   Immature Granulocytes 1 %   Abs Immature Granulocytes 0.1 0.0  - 0.1 K/uL    Comment: Performed at [facility], [place] [address], [place], [place] [address]  Protime-INR     Status: Abnormal   Collection Time: 06/11/18  9:14 PM  Result Value Ref Range   Prothrombin Time 15.3 (H) 11.4 - 15.2 seconds   INR 1.22     Comment: Performed at [facility] [address] [place], [place] [address]  Triglycerides     Status: None   Collection Time: 06/11/18  9:14 PM  Result Value Ref Range   Triglycerides 115 <150 mg/dL    Comment: Performed at [facility] [address], [place],  [address]  Type and screen If need to transfuse blood products please use the blood administration order set     Status: None  Collection Time: 06/11/18  9:20 PM  Result Value Ref Range   ABO/RH(D) B POS    Antibody Screen NEG    Sample Expiration      06/14/2018 Performed at [facility], [place] [address] [place], [place] [address]   ABO/Rh     Status: None   Collection Time: 06/11/18  9:20 PM  Result Value Ref Range   ABO/RH(D)      B POS Performed at [facility] [address], [place], [place] [address]   I-STAT 3, arterial blood gas (G3+)     Status: Abnormal   Collection Time: 06/11/18 10:35 PM  Result Value Ref Range   pH, Arterial 7.437 7.350 - 7.450   pCO2 arterial 32.6 32.0 - 48.0 mmHg   pO2, Arterial 191.0 (H) 83.0 - 108.0 mmHg   Bicarbonate 22.1 20.0 - 28.0 mmol/L   TCO2 23 22 - 32 mmol/L   O2 Saturation 100.0 %   Acid-base deficit 2.0 0.0 - 2.0 mmol/L   Patient temperature 36.4 C    Collection site RADIAL, ALLEN'S TEST ACCEPTABLE    Drawn by Operator    Sample type ARTERIAL   Culture, respiratory (tracheal aspirate)     Status: None (Preliminary result)   Collection Time: 06/11/18 10:57 PM  Result Value Ref Range   Specimen Description TRACHEAL ASPIRATE    Special Requests NONE    Gram Stain      WBC PRESENT, PREDOMINANTLY PMN NO ORGANISMS SEEN FEW GRAM POSITIVE RODS FEW YEAST    Culture      CULTURE  REINCUBATED FOR BETTER GROWTH Performed at [facility], [address] [place], [place] [address]    Report Status PENDING   Glucose, capillary     Status: Abnormal   Collection Time: 06/11/18 11:14 PM  Result Value Ref Range   Glucose-Capillary 248 (H) 65 - 99 mg/dL  Troponin I     Status: Abnormal   Collection Time: 06/12/18  1:02 AM  Result Value Ref Range   Troponin I 1.20 (HH) <0.03 ng/mL    Comment: CRITICAL VALUE NOTED.  VALUE IS CONSISTENT WITH PREVIOUSLY REPORTED AND CALLED VALUE. Performed at [facility], [place] [address] [place], [place] [address]   I-STAT 3, arterial blood gas (G3+)     Status: Abnormal   Collection Time: 06/12/18  4:19 AM  Result Value Ref Range   pH, Arterial 7.423 7.350 - 7.450   pCO2 arterial 29.2 (L) 32.0 - 48.0 mmHg   pO2, Arterial 150.0 (H) 83.0 - 108.0 mmHg   Bicarbonate 19.0 (L) 20.0 - 28.0 mmol/L   TCO2 20 (L) 22 - 32 mmol/L   O2 Saturation 99.0 %   Acid-base deficit 4.0 (H) 0.0 - 2.0 mmol/L   Patient temperature 37.3 C    Collection site RADIAL, ALLEN'S TEST ACCEPTABLE    Drawn by Operator    Sample type ARTERIAL   Glucose, capillary     Status: Abnormal   Collection Time: 06/12/18  4:25 AM  Result Value Ref Range   Glucose-Capillary 259 (H) 65 - 99 mg/dL  Troponin I     Status: Abnormal   Collection Time: 06/12/18  4:52 AM  Result Value Ref Range   Troponin I 1.20 (HH) <0.03 ng/mL    Comment: CRITICAL VALUE NOTED.  VALUE IS CONSISTENT WITH PREVIOUSLY REPORTED AND CALLED VALUE. Performed at [facility], [place] [address], [place], [place] [address]   CBC  Status: Abnormal   Collection Time: 06/12/18  4:52 AM  Result Value Ref Range   WBC 17.8 (H) 4.0 - 10.5 K/uL   RBC 2.92 (L) 3.87 - 5.11 MIL/uL   Hemoglobin 10.4 (L) 12.0 - 15.0 g/dL   HCT 29.4 (L) 36.0 - 46.0 %   MCV 100.7 (H) 78.0 - 100.0 fL   MCH 35.6 (H) 26.0 - 34.0 pg   MCHC 35.4 30.0 - 36.0 g/dL   RDW 14.2 11.5 - 15.5 %   Platelets 104 (L) 150 - 400  K/uL    Comment: CONSISTENT WITH PREVIOUS RESULT Performed at [facility], [place] [address], [place], [place] [address]   Basic metabolic panel     Status: Abnormal   Collection Time: 06/12/18  4:52 AM  Result Value Ref Range   Sodium 133 (L) 135 - 145 mmol/L   Potassium <2.0 (LL) 3.5 - 5.1 mmol/L    Comment: REPEATED TO VERIFY CRITICAL RESULT CALLED TO, READ BACK BY AND VERIFIED WITH: WOODSON B,RN 06/12/18 0604 WAYK    Chloride 101 101 - 111 mmol/L   CO2 22 22 - 32 mmol/L   Glucose, Bld 262 (H) 65 - 99 mg/dL   BUN 6 6 - 20 mg/dL   Creatinine, Ser 0.96 0.44 - 1.00 mg/dL   Calcium 7.3 (L) 8.9 - 10.3 mg/dL   GFR calc non Af Amer >60 >60 mL/min   GFR calc Af Amer >60 >60 mL/min    Comment: (NOTE) The eGFR has been calculated using the CKD EPI equation. This calculation has not been validated in all clinical situations. eGFR's persistently <60 mL/min signify possible Chronic Kidney Disease.    Anion gap 10 5 - 15    Comment: Performed at [facility] [address], [place], [place] [address]  Magnesium     Status: None   Collection Time: 06/12/18  4:52 AM  Result Value Ref Range   Magnesium 1.8 1.7 - 2.4 mg/dL    Comment: Performed at [facility] [address], [place], [place] [address]  Phosphorus     Status: Abnormal   Collection Time: 06/12/18  4:52 AM  Result Value Ref Range   Phosphorus 2.3 (L) 2.5 - 4.6 mg/dL    Comment: Performed at [facility] [address], [place], [place] [address]  Glucose, capillary     Status: Abnormal   Collection Time: 06/12/18  7:49 AM  Result Value Ref Range   Glucose-Capillary 285 (H) 65 - 99 mg/dL  Na and K (sodium & potassium), rand urine     Status: None   Collection Time: 06/12/18 10:27 AM  Result Value Ref Range   Sodium, Ur 123 mmol/L   Potassium Urine 17 mmol/L    Comment: Performed at [facility], [place] [address], [place], [place] [address]  Creatinine, urine, random     Status: None    Collection Time: 06/12/18 10:27 AM  Result Value Ref Range   Creatinine, Urine 25.69 mg/dL    Comment: Performed at [facility] [address] [place], [place] [address]  CK     Status: Abnormal   Collection Time: 06/12/18 10:31 AM  Result Value Ref Range   Total CK 274 (H) 38 - 234 U/L    Comment: Performed at [facility], [place] [address] [place], [place] [address]  Glucose, capillary     Status: Abnormal   Collection Time: 06/12/18 11:27  AM  Result Value Ref Range   Glucose-Capillary 294 (H) 65 - 99 mg/dL  Basic metabolic panel     Status: Abnormal   Collection Time: 06/12/18  1:30 PM  Result Value Ref Range   Sodium 132 (L) 135 - 145 mmol/L   Potassium 3.0 (L) 3.5 - 5.1 mmol/L   Chloride 98 (L) 101 - 111 mmol/L   CO2 21 (L) 22 - 32 mmol/L   Glucose, Bld 265 (H) 65 - 99 mg/dL   BUN <5 (L) 6 - 20 mg/dL   Creatinine, Ser 0.96 0.44 - 1.00 mg/dL   Calcium 7.6 (L) 8.9 - 10.3 mg/dL   GFR calc non Af Amer >60 >60 mL/min   GFR calc Af Amer >60 >60 mL/min    Comment: (NOTE) The eGFR has been calculated using the CKD EPI equation. This calculation has not been validated in all clinical situations. eGFR's persistently <60 mL/min signify possible Chronic Kidney Disease.    Anion gap 13 5 - 15    Comment: Performed at [facility] [address], [place], [place] [address]  Magnesium     Status: None   Collection Time: 06/12/18  1:30 PM  Result Value Ref Range   Magnesium 1.9 1.7 - 2.4 mg/dL    Comment: Performed at [facility], [place] [address], [place], [place] [address]  Glucose, capillary     Status: Abnormal   Collection Time: 06/12/18  3:24 PM  Result Value Ref Range   Glucose-Capillary 298 (H) 65 - 99 mg/dL  Glucose, capillary     Status: Abnormal   Collection Time: 06/12/18  7:33 PM  Result Value Ref Range   Glucose-Capillary 302 (H) 65 - 99 mg/dL  Glucose, capillary     Status: Abnormal   Collection Time: 06/12/18 11:38 PM  Result Value Ref  Range   Glucose-Capillary 179 (H) 65 - 99 mg/dL  Glucose, capillary     Status: Abnormal   Collection Time: 06/13/18  3:23 AM  Result Value Ref Range   Glucose-Capillary 166 (H) 70 - 99 mg/dL  Basic metabolic panel     Status: Abnormal   Collection Time: 06/13/18  3:49 AM  Result Value Ref Range   Sodium 133 (L) 135 - 145 mmol/L   Potassium 2.5 (LL) 3.5 - 5.1 mmol/L    Comment: CRITICAL RESULT CALLED TO, READ BACK BY AND VERIFIED WITH: J.CLIFTON,RN 0501 06/13/18 M.CAMPBELL    Chloride 100 98 - 111 mmol/L   CO2 24 22 - 32 mmol/L   Glucose, Bld 148 (H) 70 - 99 mg/dL   BUN <5 (L) 6 - 20 mg/dL   Creatinine, Ser 0.99 0.44 - 1.00 mg/dL   Calcium 7.8 (L) 8.9 - 10.3 mg/dL   GFR calc non Af Amer >60 >60 mL/min   GFR calc Af Amer >60 >60 mL/min    Comment: (NOTE) The eGFR has been calculated using the CKD EPI equation. This calculation has not been validated in all clinical situations. eGFR's persistently <60 mL/min signify possible Chronic Kidney Disease.    Anion gap 9 5 - 15    Comment: Performed at [facility] [address], [place], [place] [address]  Magnesium     Status: None   Collection Time: 06/13/18  3:49 AM  Result Value Ref Range   Magnesium 2.0 1.7 - 2.4 mg/dL    Comment: Performed at [facility] [address], [place], [place] [address]  Glucose,  capillary     Status: Abnormal   Collection Time: 06/13/18  7:32 AM  Result Value Ref Range   Glucose-Capillary 128 (H) 70 - 99 mg/dL  Glucose, capillary     Status: Abnormal   Collection Time: 06/13/18 11:16 AM  Result Value Ref Range   Glucose-Capillary 124 (H) 70 - 99 mg/dL  Glucose, capillary     Status: Abnormal   Collection Time: 06/13/18  4:08 PM  Result Value Ref Range   Glucose-Capillary 103 (H) 70 - 99 mg/dL     ROS:  Pertinent items noted in HPI and remainder of comprehensive ROS otherwise negative.  Physical Exam: Vitals:   06/13/18 1545 06/13/18 1600  BP: (!) 88/70 100/70  Pulse:  81 75  Resp: 20 19  Temp: 99.3 F (37.4 C) 99.3 F (37.4 C)  SpO2: 99% 97%     General exam: Lying on bed, not in distress Respiratory system: Clear to auscultation. Respiratory effort normal. No wheezing or crackle Cardiovascular system: S1 & S2 heard, RRR.  No pedal edema. Gastrointestinal system: Abdomen is mildly distended, soft. Normal bowel sounds heard. Central nervous system: Alert and oriented. No focal neurological deficits. Extremities: Symmetric 5 x 5 power. Skin: No rashes, lesions or ulcers Psychiatry: Judgement and insight appear normal. Mood & affect appropriate.   Assessment/Plan:  #Severe hypokalemia likely due to GI loss, decreased oral intake and in the setting of Levophed use. -Patient has persistent diarrhea and currently 4-5 loose bowel movement every day.  CT scan of abdomen with colitis.  - Urine potassium level was 17 with urine potassium creatinine ratio significantly low consistent with extrarenal cause of hypokalemia.  No use of diuretics.  Serum creatinine level normal. -On admission patient had hyponatremia, hypomagnesemia, hypophosphatemia, hypokalemia could be due to chronic poor oral intake and diarrhea.  The urinalysis does not have any glucose, protein or cells. -I will change potassium to 40 mEq every 4 hour, magnesium 2 today.  Check renal panel in the morning to evaluate serum albumin and phosphorus level. -Patient has history of hypothyroidism and not taking Synthroid therefore recommended to check TSH, discussed with Dr.Agarwala.  #Hypocalcemia, hypophosphatemia: Check vitamin D and PTH level.  Monitor labs.  #Hyponatremia multifactorial etiology including hypothyroidism and hypovolemic in the setting of diarrhea.  Checking urine electrolytes, urine osmolality and serum osmolality.  Serum sodium level was 133 today.  #Hypotension: Off Levophed today.  On Zosyn for colitis.  Monitor blood pressure.  I have discussed with ICU team.  Rosita Fire 06/13/2018, 4:29 PM  [facility].

## 2018-06-13 NOTE — Progress Notes (Signed)
CRITICAL VALUE ALERT  Critical Value:  K 2.5  Date & Time Notied:  06/13/18 0505  Provider Notified: Dr. Oletta Darter  Orders Received/Actions taken: 6 runs of K ordered

## 2018-06-13 NOTE — Progress Notes (Signed)
[facility] Progress Note Patient Name: Deborah Russo DOB: 12-24-1960 MRN: [id number]   Date of Service  06/13/2018  HPI/Events of Note  Patient requests medication for diarrhea.   eICU Interventions  Will order: 1. Questran 4 gm PO Q 6 hours PRN diarrhea.     Intervention Category Major Interventions: Other:  [name] [name] 06/13/2018, 4:08 AM

## 2018-06-14 LAB — RENAL FUNCTION PANEL
Albumin: 2.3 g/dL — ABNORMAL LOW (ref 3.5–5.0)
Albumin: 2.4 g/dL — ABNORMAL LOW (ref 3.5–5.0)
Anion gap: 7 (ref 5–15)
Anion gap: 6 (ref 5–15)
BUN: <5 mg/dL — ABNORMAL LOW (ref 6–20)
CALCIUM: 7.5 mg/dL — AB (ref 8.9–10.3)
CHLORIDE: 103 mmol/L (ref 98–111)
CO2: 25 mmol/L (ref 22–32)
CO2: 23 mmol/L (ref 22–32)
CREATININE: 1.05 mg/dL — AB (ref 0.44–1.00)
CREATININE: 1 mg/dL (ref 0.44–1.00)
Calcium: 7.8 mg/dL — ABNORMAL LOW (ref 8.9–10.3)
Chloride: 108 mmol/L (ref 98–111)
GFR calc Af Amer: >60 mL/min (ref >60)
GFR calc non Af Amer: 57 mL/min — ABNORMAL LOW (ref >60)
GFR calc non Af Amer: >60 mL/min (ref >60)
GLUCOSE: 129 mg/dL — AB (ref 70–99)
Glucose, Bld: 197 mg/dL — ABNORMAL HIGH (ref 70–99)
Phosphorus: 1.3 mg/dL — ABNORMAL LOW (ref 2.5–4.6)
Phosphorus: 1.1 mg/dL — ABNORMAL LOW (ref 2.5–4.6)
Potassium: 3.5 mmol/L (ref 3.5–5.1)
Potassium: 3.7 mmol/L (ref 3.5–5.1)
SODIUM: 135 mmol/L (ref 135–145)
Sodium: 137 mmol/L (ref 135–145)

## 2018-06-14 LAB — CORTISOL: Cortisol, Plasma: 19.2 ug/dL

## 2018-06-14 LAB — GLUCOSE, CAPILLARY
GLUCOSE-CAPILLARY: 174 mg/dL — AB (ref 70–99)
GLUCOSE-CAPILLARY: 206 mg/dL — AB (ref 70–99)
GLUCOSE-CAPILLARY: 132 mg/dL — AB (ref 70–99)
Glucose-Capillary: 315 mg/dL — ABNORMAL HIGH (ref 70–99)
Glucose-Capillary: 182 mg/dL — ABNORMAL HIGH (ref 70–99)
Glucose-Capillary: 132 mg/dL — ABNORMAL HIGH (ref 70–99)
Glucose-Capillary: 115 mg/dL — ABNORMAL HIGH (ref 70–99)

## 2018-06-14 LAB — C-REACTIVE PROTEIN: CRP: 1.4 mg/dL — AB (ref <1.0)

## 2018-06-14 LAB — CULTURE, RESPIRATORY W GRAM STAIN: Culture: NORMAL

## 2018-06-14 LAB — OSMOLALITY: Osmolality: 281 mOsm/kg (ref 275–295)

## 2018-06-14 LAB — CULTURE, RESPIRATORY

## 2018-06-14 LAB — CALCIUM: Calcium: 7.4 mg/dL — ABNORMAL LOW (ref 8.9–10.3)

## 2018-06-14 LAB — MAGNESIUM: MAGNESIUM: 1.8 mg/dL (ref 1.7–2.4)

## 2018-06-14 LAB — VITAMIN B12: VITAMIN B 12: 98 pg/mL — AB (ref 180–914)

## 2018-06-14 MED ORDER — ENSURE ENLIVE PO LIQD
237.0000 mL | Freq: Two times a day (BID) | ORAL | Status: DC
  Administered 2018-06-14 – 2018-06-15 (×2): 237 mL via ORAL

## 2018-06-14 MED ORDER — POTASSIUM PHOSPHATE MONOBASIC 500 MG PO TABS
1000.0000 mg | ORAL_TABLET | Freq: Three times a day (TID) | ORAL | Status: DC
  Filled 2018-06-14 (×2): qty 2

## 2018-06-14 MED ORDER — LEVOTHYROXINE SODIUM 50 MCG PO TABS
50.0000 ug | ORAL_TABLET | Freq: Every day | ORAL | Status: DC
  Administered 2018-06-14 – 2018-06-15 (×2): 50 ug via ORAL
  Filled 2018-06-14 (×2): qty 1

## 2018-06-14 MED ORDER — MAGNESIUM SULFATE 2 GM/50ML IV SOLN
2.0000 g | Freq: Once | INTRAVENOUS | Status: AC
  Administered 2018-06-14: 2 g via INTRAVENOUS
  Filled 2018-06-14: qty 50

## 2018-06-14 MED ORDER — K PHOS MONO-SOD PHOS DI & MONO 155-852-130 MG PO TABS
1000.0000 mg | ORAL_TABLET | Freq: Three times a day (TID) | ORAL | Status: DC
  Administered 2018-06-14 – 2018-06-15 (×5): 1000 mg via ORAL
  Filled 2018-06-14 (×8): qty 4

## 2018-06-14 MED ORDER — POTASSIUM CHLORIDE CRYS ER 20 MEQ PO TBCR
40.0000 meq | EXTENDED_RELEASE_TABLET | Freq: Four times a day (QID) | ORAL | Status: AC
  Administered 2018-06-14 – 2018-06-15 (×5): 40 meq via ORAL
  Filled 2018-06-14 (×7): qty 2

## 2018-06-14 NOTE — Progress Notes (Signed)
Inpatient Diabetes Program Recommendations  AACE/ADA: New Consensus Statement on Inpatient Glycemic Control (2015)  Target Ranges:  Prepandial:   less than 140 mg/dL      Peak postprandial:   less than 180 mg/dL (1-2 hours)      Critically ill patients:  140 - 180 mg/dL   Results for Deborah Russo, Deborah Russo (MRN [id number]) as of 06/14/2018 11:59  Ref. Range 06/14/2018 03:21 06/14/2018 07:35 06/14/2018 11:36  Glucose-Capillary Latest Ref Range: 70 - 99 mg/dL 174 (H) 206 (H) 315 (H)   Review of Glycemic Control  Diabetes history: None Current orders for Inpatient glycemic control: Novolog Moderate Correction 0-15 units Q4hours  Inpatient Diabetes Program Recommendations:    Patient was on clear liquid diet now on a regular diet. Glucose trends elevated in the 200-300 range. Consider an updated A1c level. If glucose continues to be elevated, consider low dose basal insulin Lantus 8-10 units.   Thanks,  Tama Headings RN, MSN, BC-ADM, [facility] Inpatient Diabetes Coordinator Team Pager [phone] (8a-5p)

## 2018-06-14 NOTE — Progress Notes (Signed)
Follow up - Critical Care Medicine Note  Patient Details:    Deborah Russo is an 57 y.o. female.  She was admitted June 23 for altered mental status thought to be due to polysubstance abuse.  Had a similar presentation one year ago.  Was also found to be hypotensive. Was intubated for airway protection and received fluid resuscitation.  Reports long history of abdominal pain treated with narcotics, persistent nausea vomiting and profuse diarrhea. Reportedly had not been eating or drinking normally and taking none of her medication apart from analgesics and anxiolytics.  Past Medical History:  Diagnosis Date  . Chronic abdominal pain    since approximately 1991  . Chronic migraine   . Chronic pain   . Hypothyroidism   . Nausea, vomiting, and diarrhea    recurrent, chronic  . Pain management   . Sleep apnea    has CPAP, but doesn't wear it bc she says "i can't sleep with it on"  . Stroke [facility])       Lines, Airways, Drains: CVC Triple Lumen 06/11/18 Right Femoral (Active)  Indication for Insertion or Continuance of Line Vasoactive infusions;Limited venous access - need for IV therapy >5 days (PICC only);Poor Vasculature-patient has had multiple peripheral attempts or PIVs lasting less than 24 hours 06/13/2018  7:45 AM  Site Assessment Clean;Dry;Intact 06/13/2018  7:45 AM  Proximal Lumen Status Infusing 06/13/2018  7:45 AM  Medial Lumen Status Infusing 06/13/2018  7:45 AM  Distal Lumen Status Flushed;In-line blood sampling system in place 06/13/2018  7:45 AM  Dressing Type Transparent;Occlusive 06/13/2018  7:45 AM  Dressing Status Clean;Dry;Intact;Antimicrobial disc in place 06/13/2018  7:45 AM  Line Care Connections checked and tightened 06/13/2018  7:45 AM  Dressing Change Due 06/18/18 06/13/2018  7:45 AM     Urethral Catheter (Active)  Indication for Insertion or Continuance of Catheter Unstable critical patients (first 24-48 hours) 06/13/2018  8:00 AM  Site Assessment Clean;Intact  06/13/2018  8:00 AM  Catheter Maintenance Bag below level of bladder;Catheter secured;Drainage bag/tubing not touching floor;Insertion date on drainage bag;No dependent loops;Seal intact;Bag emptied prior to transport 06/13/2018  8:00 AM  Collection Container Standard drainage bag 06/13/2018  8:00 AM  Securement Method Leg strap 06/13/2018  8:00 AM  Urinary Catheter Interventions Unclamped 06/13/2018  8:00 AM  Output (mL) 137 mL 06/13/2018  8:00 AM    Anti-infectives:  Anti-infectives (From admission, onward)   Start     Dose/Rate Route Frequency Ordered Stop   06/12/18 0000  vancomycin (VANCOCIN) IVPB 1000 mg/200 mL premix  Status:  Discontinued     1,000 mg 200 mL/hr over 60 Minutes Intravenous Every 12 hours 06/11/18 1419 06/13/18 1021   06/11/18 2100  piperacillin-tazobactam (ZOSYN) IVPB 3.375 g     3.375 g 12.5 mL/hr over 240 Minutes Intravenous Every 8 hours 06/11/18 1419     06/11/18 1300  vancomycin (VANCOCIN) 1,500 mg in sodium chloride 0.9 % 500 mL IVPB     1,500 mg 250 mL/hr over 120 Minutes Intravenous  Once 06/11/18 1143 06/11/18 1444   06/11/18 1145  piperacillin-tazobactam (ZOSYN) IVPB 3.375 g     3.375 g 100 mL/hr over 30 Minutes Intravenous  Once 06/11/18 1137 06/11/18 1230   06/11/18 1145  vancomycin (VANCOCIN) IVPB 1000 mg/200 mL premix  Status:  Discontinued     1,000 mg 200 mL/hr over 60 Minutes Intravenous  Once 06/11/18 1137 06/11/18 1142      Microbiology: Results for orders placed or performed during the hospital  encounter of 06/11/18  Urine culture     Status: None   Collection Time: 06/11/18 11:37 AM  Result Value Ref Range Status   Specimen Description   Final    URINE, CLEAN CATCH Performed at [facility], [address] [place], [place] [address]    Special Requests   Final    NONE Performed at [facility], [address], [place], [place] [address]    Culture   Final    NO GROWTH Performed at [facility], [place] [address] [place],  [place] [address]    Report Status 06/13/2018 FINAL  Final  Blood Culture (routine x 2)     Status: None (Preliminary result)   Collection Time: 06/11/18 11:41 AM  Result Value Ref Range Status   Specimen Description BLOOD LEFT ARM  Final   Special Requests   Final    BOTTLES DRAWN AEROBIC ONLY Blood Culture adequate volume   Culture   Final    NO GROWTH 2 DAYS Performed at [facility], [address], [place], [place] [address]    Report Status PENDING  Incomplete  Blood Culture (routine x 2)     Status: None (Preliminary result)   Collection Time: 06/11/18 12:36 PM  Result Value Ref Range Status   Specimen Description BLOOD RIGHT HAND  Final   Special Requests   Final    BOTTLES DRAWN AEROBIC AND ANAEROBIC Blood Culture adequate volume   Culture   Final    NO GROWTH 2 DAYS Performed at [facility], [address] [place], [place] [address]    Report Status PENDING  Incomplete  MRSA PCR Screening     Status: None   Collection Time: 06/11/18  6:34 PM  Result Value Ref Range Status   MRSA by PCR NEGATIVE NEGATIVE Final    Comment:        The GeneXpert MRSA Assay (FDA approved for NASAL specimens only), is one component of a comprehensive MRSA colonization surveillance program. It is not intended to diagnose MRSA infection nor to guide or monitor treatment for MRSA infections. Performed at [facility], [place] [address], [place], [place] [address]   Culture, blood (routine x 2)     Status: None (Preliminary result)   Collection Time: 06/11/18  7:40 PM  Result Value Ref Range Status   Specimen Description BLOOD RIGHT HAND  Final   Special Requests   Final    BOTTLES DRAWN AEROBIC ONLY Blood Culture adequate volume   Culture   Final    NO GROWTH 2 DAYS Performed at [facility], [place] [address] [place], [place] [address]    Report Status PENDING  Incomplete  Culture, blood (routine x 2)     Status: None (Preliminary result)   Collection Time: 06/11/18  7:45 PM   Result Value Ref Range Status   Specimen Description BLOOD RIGHT ANTECUBITAL  Final   Special Requests   Final    BOTTLES DRAWN AEROBIC ONLY Blood Culture adequate volume   Culture   Final    NO GROWTH 2 DAYS Performed at [facility], [place] [address] [place], [place] [address]    Report Status PENDING  Incomplete  Culture, respiratory (tracheal aspirate)     Status: None (Preliminary result)   Collection Time: 06/11/18 10:57 PM  Result Value Ref Range Status   Specimen Description TRACHEAL ASPIRATE  Final   Special Requests NONE  Final   Gram Stain  Final    WBC PRESENT, PREDOMINANTLY PMN NO ORGANISMS SEEN FEW GRAM POSITIVE RODS FEW YEAST    Culture   Final    CULTURE REINCUBATED FOR BETTER GROWTH Performed at [facility], [place] [address], [place], [place] [address]    Report Status PENDING  Incomplete  C Difficile Quick Screen w PCR reflex     Status: None   Collection Time: 06/13/18  4:19 PM  Result Value Ref Range Status   C Diff antigen NEGATIVE NEGATIVE Final   C Diff toxin NEGATIVE NEGATIVE Final   C Diff interpretation No C. difficile detected.  Final    Comment: Performed at [facility], [place] [address] [place], [place] [address]   BMET    Component Value Date/Time   NA 135 06/14/2018 0415   K 3.5 06/14/2018 0415   CL 103 06/14/2018 0415   CO2 25 06/14/2018 0415   GLUCOSE 197 (H) 06/14/2018 0415   BUN <5 (L) 06/14/2018 0415   CREATININE 1.05 (H) 06/14/2018 0415   CALCIUM 7.4 (L) 06/14/2018 0417   GFRNONAA 57 (L) 06/14/2018 0415   GFRAA >60 06/14/2018 0415   CBC    Component Value Date/Time   WBC 17.8 (H) 06/12/2018 0452   RBC 2.92 (L) 06/12/2018 0452   HGB 10.4 (L) 06/12/2018 0452   HCT 29.4 (L) 06/12/2018 0452   PLT 104 (L) 06/12/2018 0452   MCV 100.7 (H) 06/12/2018 0452   MCH 35.6 (H) 06/12/2018 0452   MCHC 35.4 06/12/2018 0452   RDW 14.2 06/12/2018 0452   LYMPHSABS 2.8 06/11/2018 2114   MONOABS 0.5 06/11/2018 2114   EOSABS  0.1 06/11/2018 2114   BASOSABS 0.0 06/11/2018 2114   TSH: 80.3  Best Practice/Protocols:  VTE Prophylaxis: Lovenox (prophylaxtic dose)  Events:  Extubated 06/12/2018  Studies: Ct Head Wo Contrast  Result Date: 06/11/2018 CLINICAL DATA:  Altered mental status. EXAM: CT HEAD WITHOUT CONTRAST TECHNIQUE: Contiguous axial images were obtained from the base of the skull through the vertex without intravenous contrast. COMPARISON:  01/23/2018 FINDINGS: Brain: No evidence of acute infarction, hemorrhage, hydrocephalus, extra-axial collection or mass lesion/mass effect. There is sulcal enlargement consistent with mild atrophy. Old infarct is noted at the junction of the posterior left frontal and left parietal lobe superiorly, stable from the prior exam. Vascular: No hyperdense vessel or unexpected calcification. Skull: Normal. Negative for fracture or focal lesion. Sinuses/Orbits: Visualized globes and orbits are unremarkable. The visualized sinuses and mastoid air cells are clear. Other: None. IMPRESSION: 1. No acute intracranial abnormalities. 2. Mild atrophy.  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 Electronically Signed   By: Lajean Manes M.D.   On: 06/11/2018 12:51   Ct Abdomen Pelvis W Contrast  Result Date: 06/11/2018 CLINICAL DATA:  57 year old female with history of altered mental status. Possible abdominal mass noted on physical examination. EXAM: CT ABDOMEN AND PELVIS WITH CONTRAST TECHNIQUE: Multidetector CT imaging of the abdomen and pelvis was performed using the standard protocol following bolus administration of intravenous contrast. CONTRAST:  141m ISOVUE-300 IOPAMIDOL (ISOVUE-300) INJECTION 61% COMPARISON:  CT the abdomen and pelvis 07/13/2017. FINDINGS: Lower chest: Unremarkable. Hepatobiliary: Diffuse low attenuation throughout the hepatic parenchyma, indicative of severe hepatic steatosis. Liver also has a shrunken appearance and nodular contour, indicative of a background of  cirrhosis. No definite cystic or solid hepatic lesions. No intra or extrahepatic biliary ductal dilatation. Small amount of pneumobilia in the left lobe of the liver, indicative of prior sphincterotomy. Status post cholecystectomy.  Pancreas: No pancreatic mass. No pancreatic ductal dilatation. No pancreatic or peripancreatic fluid or inflammatory changes. Spleen: Unremarkable. Adrenals/Urinary Tract: Bilateral kidneys and bilateral adrenal glands are normal in appearance. No hydroureteronephrosis. Foley balloon catheter present in the lumen of the urinary bladder. Urinary bladder is nearly decompressed, but otherwise unremarkable in appearance. Stomach/Bowel: Normal appearance of the stomach. No pathologic dilatation of small bowel or colon. Severe mural thickening throughout the entire colon and rectum with surrounding inflammatory changes in the adjacent mesocolon and mesorectum, indicative of severe pancolitis. Normal appendix. Vascular/Lymphatic: Aortic atherosclerosis, without evidence of aneurysm or dissection in the abdominal or pelvic vasculature. High-grade stenosis or complete occlusion of the left common iliac artery with distal reconstitution of flow related to collateral pathways. No lymphadenopathy noted in the abdomen or pelvis. Reproductive: Status post hysterectomy. Ovaries are not confidently identified may be surgically absent or atrophic. Other: Trace volume of ascites.  No pneumoperitoneum. Musculoskeletal: Chronic compression fracture of L4 with approximately 20% loss of anterior vertebral body height, unchanged. There are no aggressive appearing lytic or blastic lesions noted in the visualized portions of the skeleton. IMPRESSION: 1. Severe pancolitis. Clinical correlation for signs and symptoms of C difficile colitis is suggested. 2. Hepatic steatosis with evidence of cirrhosis. 3. Aortic atherosclerosis, including high-grade stenosis or occlusion of the left common iliac artery. 4.  Additional incidental findings, as above. Electronically Signed   By: Vinnie Langton M.D.   On: 06/11/2018 12:36   Dg Chest Port 1 View  Result Date: 06/11/2018 CLINICAL DATA:  Intubated. EXAM: PORTABLE CHEST 1 VIEW COMPARISON:  Earlier today. FINDINGS: The endotracheal tube has been advanced with its tip 5 mm above the carina. Nasogastric tube extending into the stomach. Mildly improved enlargement of the cardiac silhouette. Decreased prominence of the pulmonary vasculature and interstitial markings. Linear and small amount of ill-defined density at the left lung base. Diffuse osteopenia. IMPRESSION: 1. Endotracheal tube tip 5 mm above the carina. It is recommended that this be retracted 4 cm. 2. Improved cardiomegaly and changes of congestive heart failure. 3. Left basilar atelectasis and possible small amount of pleural fluid. Electronically Signed   By: Claudie Revering M.D.   On: 06/11/2018 19:19    Consults: Treatment Team:  Rosita Fire, MD   Subjective:    Overnight Issues: tolerated extubation. Remains on low dose NE. + Appetite. Reports chronic upper abdominal pain for which she has been taking narcotics for many years following cholecystectomy.  Profuse watery diarrhea yesterday requiring insertion of Flexiseal.   Objective:  Vital signs for last 24 hours: Temp:  [98.2 F (36.8 C)-99.9 F (37.7 C)] 99.9 F (37.7 C) (06/26 0700) Pulse Rate:  [62-86] 79 (06/26 0700) Resp:  [8-28] 14 (06/26 0700) BP: (72-130)/(33-85) 94/79 (06/26 0700) SpO2:  [95 %-100 %] 100 % (06/26 0700) Weight:  [186 lb 11.7 oz (84.7 kg)] 186 lb 11.7 oz (84.7 kg) (06/26 0350)  Hemodynamic parameters for last 24 hours:  Remains of NE.  Intake/Output from previous day: 06/25 0701 - 06/26 0700 In: 2571.5 [I.V.:2434; IV Piggyback:137.5] Out: 3013 [Urine:2413; Stool:600]  Intake/Output this shift: No intake/output data recorded.  Vent settings for last 24 hours:    Physical Exam:  General:  alert and no respiratory distress Neuro: alert, oriented and nonfocal exam HEENT/Neck: no JVD Resp: clear to auscultation bilaterally CVS: murmur GI: distended Skin: no rash Extremities: no edema, no erythema, pulses WNL  Assessment/Plan:   NEURO  No delirium. Ill-defined chronic pain syndrome. Chronic narcotic use  may be contributing to current admission.    Plan: Limit narcotic and attempt to make firm diagnosis   PULM  Repiratory failure has resolved.    Plan: progressive ambulation.  CARDIO  Hypotension requiring NE to maintain MAP, likely due to volume contraction and hypothyroidism.   Plan: Continue to wean pressors. Evaluate for adrenal insufficiency.  RENAL  Persistent hypokalemia and volume contraction of unclear etiology.   Plan: Nephrology consulltation.  GI  Colitis on CT with history of chronic epigastric pain.   Profuse diarrhea - secretory vs malabsorptive. Rule out inflammatory causes.  CT also shows evidence of cirrhosis NASH vs alcoholic cirrhosis.    Plan: GI consultation.  Diarrheal studies sent. Start anti-diarrheal agents. Rule out concurrent viral hepatitis.  ID  Colitis of possible ischemic vs infectious etiology. Borderline PCT.   Plan: Discontinue antibiotics if PCT continues to decline.  HEME  Leukocytosis (reactive.)   Plan: no indication for transfusion.   ENDO adequate glycemic control. Severe hypothyroidism   Plan: continue current insulin regimen. Initiate thyroid replacement. Rule out concurrent hypoadrenalism.  Initiate levothyroxine at 79mg/day. Goal dose: 112-1270m.  Global Issues   Improving, will start thyroid hormone replacement and continue diarrhea work-up.    LOS: 3 days   Additional comments:None  Critical Care Total Time*: 40[place]/26/2019  *Care during the described time interval was provided by me and/or other providers on the critical care team.  I have reviewed this patient's available data, including medical  history, events of note, physical examination and test results as part of my evaluation.

## 2018-06-14 NOTE — Progress Notes (Signed)
[facility] NEPHROLOGY PROGRESS NOTE  Assessment/ Plan: Pt is a 57 y.o. yo female history of hypertension, chronic migraine, chronic abdomen pain, substance abuse, hypothyroidism, noncompliant with the medication admitted with altered mental status in the setting of polysubstance abuse and unintentional overdose.  Consulted for severe electrolyte disturbance.  Assessment/Plan:  #Severe hypokalemia likely due to GI loss, decreased oral intake. -Patient has persistent diarrhea and currently 4-5 loose bowel movement every day.  CT scan of abdomen with colitis.  - Urine potassium level was 17 with urine potassium creatinine ratio significantly low consistent with extrarenal cause of hypokalemia.  No use of diuretics.  Serum creatinine level normal. -On admission patient had hyponatremia, hypomagnesemia, hypophosphatemia, hypokalemia could be due to chronic poor oral intake and diarrhea.  The urinalysis does not have any glucose, protein or cells. -Serum potassium level improved to 3.5 with KCl repletion.  Continue to monitor electrolytes and replete IV and oral.  Discussed with ICU team.  #Hypocalcemia, hypophosphatemia: Started Kphos.  Monitor labs.  Follow-up vitamin D and PTH level.    #Hyponatremia multifactorial etiology including hypothyroidism and hypovolemic in the setting of diarrhea.    Level improved to 135.  #Hypotension: Off Levophed today.  On Zosyn for colitis.  Monitor blood pressure.  #Persistent diarrhea with colitis: Per primary team.  Discussed with ICU team.  Electrolytes improving.  Continue to monitor lab.  I will sign off at this time, call with question.  Subjective: Seen and examined at bedside.  Still having loose bowel movement.  Some abdominal discomfort.  No nausea, vomiting, chest pain or shortness of breath. Objective Vital signs in last 24 hours: Vitals:   06/14/18 0915 06/14/18 0930 06/14/18 0945 06/14/18 1000  BP: 102/89 108/73 100/75  96/78  Pulse: 79 76 74 78  Resp: 17 19 18 16   Temp: 99.3 F (37.4 C) 99.1 F (37.3 C) 99.1 F (37.3 C) 99.1 F (37.3 C)  TempSrc:      SpO2: 100% 99% 99% 99%  Weight:      Height:       Weight change: 0.3 kg (10.6 oz)  Intake/Output Summary (Last 24 hours) at 06/14/2018 1130 Last data filed at 06/14/2018 1000 Gross per 24 hour  Intake 2416.63 ml  Output 2603 ml  Net -186.37 ml       Labs: Basic Metabolic Panel: Recent Labs  Lab 06/11/18 2114 06/12/18 0452  06/13/18 0349 06/13/18 1704 06/14/18 0415 06/14/18 0417  NA 131* 133*   < > 133* 135 135  --   K <2.0* <2.0*   < > 2.5* 2.8* 3.5  --   CL 98* 101   < > 100 102 103  --   CO2 23 22   < > 24 25 25   --   GLUCOSE 324* 262*   < > 148* 99 197*  --   BUN 6 6   < > <5* <5* <5*  --   CREATININE 0.97 0.96   < > 0.99 0.95 1.05*  --   CALCIUM 7.4* 7.3*   < > 7.8* 7.6* 7.5* 7.4*  PHOS <1.0* 2.3*  --   --   --  1.3*  --    < > = values in this interval not displayed.   Liver Function Tests: Recent Labs  Lab 06/11/18 1238 06/11/18 2114 06/14/18 0415  AST 19 23  --   ALT 11* 13*  --   ALKPHOS 66 87  --   BILITOT 0.7 0.8  --  PROT 4.0* 5.3*  --   ALBUMIN 2.0* 2.6* 2.3*   Recent Labs  Lab 06/11/18 2114  LIPASE 30   No results for input(s): AMMONIA in the last 168 hours. CBC: Recent Labs  Lab 06/11/18 1141 06/11/18 2114 06/12/18 0452  WBC 6.3 15.2* 17.8*  NEUTROABS 4.6 11.7*  --   HGB 10.8* 9.9* 10.4*  HCT 29.9* 27.1* 29.4*  MCV 102.0* 101.1* 100.7*  PLT 78* 90* 104*   Cardiac Enzymes: Recent Labs  Lab 06/11/18 1238 06/11/18 2114 06/12/18 0102 06/12/18 0452 06/12/18 1031  CKTOTAL  --   --   --   --  274*  TROPONINI 0.08* 1.17* 1.20* 1.20*  --    CBG: Recent Labs  Lab 06/13/18 1608 06/13/18 1940 06/13/18 2331 06/14/18 0321 06/14/18 0735  GLUCAP 103* 91 133* 174* 206*    Iron Studies: No results for input(s): IRON, TIBC, TRANSFERRIN, FERRITIN in the last 72 hours. Studies/Results: No  results found.  Medications: Infusions: . sodium chloride    . dextrose 5 % lactated ringers with KCl/Additives Pediatric custom IV fluid 100 mL/hr at 06/14/18 0434  . norepinephrine (LEVOPHED) Adult infusion Stopped (06/14/18 1006)  . piperacillin-tazobactam (ZOSYN)  IV 3.375 g (06/14/18 0349)    Scheduled Medications: . chlorhexidine  15 mL Mouth Rinse BID  . famotidine  40 mg Oral BID  . folic acid  1 mg Per Tube Daily  . heparin  5,000 Units Subcutaneous Q8H  . insulin aspart  0-15 Units Subcutaneous Q4H  . levothyroxine  50 mcg Oral QAC breakfast  . magnesium oxide  400 mg Oral Daily  . mouth rinse  15 mL Mouth Rinse q12n4p  . pantoprazole  40 mg Oral BID  . phosphorus  1,000 mg Oral TID WC & HS  . potassium chloride  40 mEq Oral Q6H  . thiamine  100 mg Per Tube Daily    have reviewed scheduled and prn medications.  Physical Exam: General:NAD, comfortable Heart:RRR, s1s2 nl Lungs:clear b/l, no crackle Abdomen:soft, mildly distended Extremities:No edema  Dron Prasad Bhandari 06/14/2018,11:30 AM  LOS: 3 days

## 2018-06-14 NOTE — Progress Notes (Signed)
Nutrition Follow-up  DOCUMENTATION CODES:   Obesity unspecified  INTERVENTION:   Ensure Enlive po TID, each supplement provides 350 kcal and 20 grams of protein  If poor po intake continues, recommend consideration of nutrition support: Cortrak insertion with initiation of TF vs initiation of TPN depending on results of GI work-up  NUTRITION DIAGNOSIS:   Inadequate oral intake related to acute illness as evidenced by NPO status.  Continues but being addressed via supplements  GOAL:   Patient will meet greater than or equal to 90% of their needs  Not Met  MONITOR:   TF tolerance, Vent status, Labs, Weight trends  REASON FOR ASSESSMENT:   Consult Assessment of nutrition requirement/status  ASSESSMENT:   57 yo female admitted with AMS, intubated for airway protection. Pt with hx of substance abuse, stroke, chronic pain, diarrhea  6/23 CT abdomen severe pan-colitis, hepatic steatosis with evidenced of cirrhosis; CT head negative 6/24 Extubated  Pt alert, very flat on visit today Pt eating minimally, 15% at breakfast yesterday AM, no other intake recorded. Pt did not eat anything for breakfast other than coffee this AM. Pt taking meds in applesauce with assistance from RN on visit today. Pt agreeable to taking some Ensure, took sips during med pass but reports she did not have Ensure yesterday.  Pt did not receive TF during brief intubation  Watery stool persists, 500 mL total yesterday, rectal tube in place. On visit today, rectal tube leaking with stool in bed, RN present. Pt indicates long hx of abdominal pain treated with pain meds, persistent N/V, profuse diarrhea with frequent use of imodium as outpatient. GI has been consulted and work-up in progress  Again interviewed patient this AM. Pt unable to give very good history. Pt verbalizes she has not been eating well but does not know how long. Pt cannot tell writer how well she has been eating. Pt does not know if she has  lost weight and does not know what her UBW. RD previously was told that pt's abdomen "grows and shrinks" and sometimes her clothes do not fit well.   Pt indicates to RN that she just wants to be left at home; does not want to be cleaned up despite sitting in stool.   Unclear social situation. Pt reports she resides in her own home with daughter but have also received report that pt lives with her sister sometimes and sometimes with her best friend and that they help care for her. Also noted pt has not been taking meds at home including her thyroid medication  Labs: BUN <5, Creaitnine 0.93, phosphorus 1.7, CBGs 91-315, CRP 1.4, PTH 234 Meds: cholecalciferol, ss novolog, imodium, mag-ox, mag sulfate, MVI, KCl, K Phos Neutral, thiamine, B-12  Pt very likely has some nutritional deficiencies but unable to diagnose patient with malnutrition at this time based on current exam using ASPEN/AND criteria for malnutrition   NUTRITION - FOCUSED PHYSICAL EXAM:  RD does not see any true signs of muscle wasting or subcutaneous fat loss. Abdomen is obese/distended but no signs of edema  Diet Order:   Diet Order           Diet regular Room service appropriate? Yes; Fluid consistency: Thin  Diet effective now          EDUCATION NEEDS:   No education needs have been identified at this time  Skin:  Skin Assessment: Reviewed RN Assessment  Last BM:  6/27 watery stool via rectal tube  Height:   Ht Readings  from Last 1 Encounters:  06/11/18 _0  (1.575 m)    Weight:   Wt Readings from Last 1 Encounters:  06/15/18 192 lb 0.3 oz (87.1 kg)    Ideal Body Weight:     BMI:  Body mass index is 35.12 kg/m.  Estimated Nutritional Needs:   Kcal:  5996-8957 kcals  Protein:  85-100 g  Fluid:  >/= 1.5 L   Kerman Passey MS, RD, LDN, CNSC [phone] Pager  [phone] Weekend/On-Call Pager

## 2018-06-14 NOTE — Progress Notes (Signed)
Patient transferred from 57m0 to 56m. Telemetry box 42m12mplaced on patient with 2 staff verifications. Skin assessed by 2 RNs. Foam dressing to sacrum for preventative. Assessment completed. Patient oriented to room. VSS. Will continue to monitor. GWABartholomew CrewsN

## 2018-06-15 ENCOUNTER — Inpatient Hospital Stay (HOSPITAL_COMMUNITY): Payer: Medicare Other

## 2018-06-15 DIAGNOSIS — D539 Nutritional anemia, unspecified: Secondary | ICD-10-CM

## 2018-06-15 DIAGNOSIS — K7460 Unspecified cirrhosis of liver: Secondary | ICD-10-CM

## 2018-06-15 DIAGNOSIS — K529 Noninfective gastroenteritis and colitis, unspecified: Secondary | ICD-10-CM

## 2018-06-15 DIAGNOSIS — R933 Abnormal findings on diagnostic imaging of other parts of digestive tract: Secondary | ICD-10-CM

## 2018-06-15 LAB — RENAL FUNCTION PANEL
Albumin: 2.6 g/dL — ABNORMAL LOW (ref 3.5–5.0)
Albumin: 2.6 g/dL — ABNORMAL LOW (ref 3.5–5.0)
Anion gap: 10 (ref 5–15)
Anion gap: 8 (ref 5–15)
BUN: <5 mg/dL — ABNORMAL LOW (ref 6–20)
CHLORIDE: 108 mmol/L (ref 98–111)
CO2: 20 mmol/L — AB (ref 22–32)
CO2: 20 mmol/L — AB (ref 22–32)
CREATININE: 0.98 mg/dL (ref 0.44–1.00)
Calcium: 7.9 mg/dL — ABNORMAL LOW (ref 8.9–10.3)
Calcium: 7.9 mg/dL — ABNORMAL LOW (ref 8.9–10.3)
Chloride: 109 mmol/L (ref 98–111)
Creatinine, Ser: 0.93 mg/dL (ref 0.44–1.00)
GLUCOSE: 129 mg/dL — AB (ref 70–99)
Glucose, Bld: 134 mg/dL — ABNORMAL HIGH (ref 70–99)
PHOSPHORUS: 1.7 mg/dL — AB (ref 2.5–4.6)
POTASSIUM: 4.9 mmol/L (ref 3.5–5.1)
Phosphorus: 2.6 mg/dL (ref 2.5–4.6)
Potassium: 4.4 mmol/L (ref 3.5–5.1)
Sodium: 139 mmol/L (ref 135–145)
Sodium: 136 mmol/L (ref 135–145)

## 2018-06-15 LAB — GLUCOSE, CAPILLARY
GLUCOSE-CAPILLARY: 115 mg/dL — AB (ref 70–99)
GLUCOSE-CAPILLARY: 119 mg/dL — AB (ref 70–99)
GLUCOSE-CAPILLARY: 133 mg/dL — AB (ref 70–99)
GLUCOSE-CAPILLARY: 144 mg/dL — AB (ref 70–99)
GLUCOSE-CAPILLARY: 145 mg/dL — AB (ref 70–99)
GLUCOSE-CAPILLARY: 156 mg/dL — AB (ref 70–99)
Glucose-Capillary: 114 mg/dL — ABNORMAL HIGH (ref 70–99)

## 2018-06-15 LAB — PTH, INTACT AND CALCIUM
Calcium, Total (PTH): 7.2 mg/dL — ABNORMAL LOW (ref 8.7–10.2)
PTH: 234 pg/mL — ABNORMAL HIGH (ref 15–65)

## 2018-06-15 LAB — VITAMIN D 25 HYDROXY (VIT D DEFICIENCY, FRACTURES): Vit D, 25-Hydroxy: <4 ng/mL — ABNORMAL LOW (ref 30.0–100.0)

## 2018-06-15 LAB — GLIA (IGA/G) + TTG IGA
ANTIGLIADIN ABS, IGA: 4 U (ref 0–19)
GLIADIN IGG: 1 U (ref 0–19)

## 2018-06-15 LAB — GASTRIN: Gastrin: 76 pg/mL (ref 0–115)

## 2018-06-15 LAB — CALPROTECTIN, FECAL: Calprotectin, Fecal: 39 ug/g (ref 0–120)

## 2018-06-15 LAB — OVA + PARASITE EXAM

## 2018-06-15 LAB — MAGNESIUM: MAGNESIUM: 2.2 mg/dL (ref 1.7–2.4)

## 2018-06-15 LAB — O&P RESULT

## 2018-06-15 MED ORDER — THIAMINE HCL 100 MG/ML IJ SOLN
100.0000 mg | Freq: Every day | INTRAMUSCULAR | Status: DC
  Administered 2018-06-15 – 2018-06-16 (×2): 100 mg via INTRAVENOUS
  Filled 2018-06-15 (×2): qty 2

## 2018-06-15 MED ORDER — VITAMIN D 1000 UNITS PO TABS
1000.0000 [IU] | ORAL_TABLET | Freq: Every day | ORAL | Status: DC
  Administered 2018-06-15 – 2018-06-23 (×9): 1000 [IU] via ORAL
  Filled 2018-06-15 (×9): qty 1

## 2018-06-15 MED ORDER — COSYNTROPIN 0.25 MG IJ SOLR
0.2500 mg | Freq: Once | INTRAMUSCULAR | Status: AC
  Administered 2018-06-16: 0.25 mg via INTRAVENOUS
  Filled 2018-06-15: qty 0.25

## 2018-06-15 MED ORDER — ENSURE ENLIVE PO LIQD: 237.0000 mL | Freq: Three times a day (TID) | ORAL | Status: DC

## 2018-06-15 MED ORDER — VITAMIN B-12 100 MCG PO TABS
250.0000 ug | ORAL_TABLET | Freq: Every day | ORAL | Status: DC
  Administered 2018-06-15 – 2018-06-23 (×9): 250 ug via ORAL
  Filled 2018-06-15 (×9): qty 3

## 2018-06-15 MED ORDER — LEVOTHYROXINE SODIUM 100 MCG IV SOLR
50.0000 ug | Freq: Every day | INTRAVENOUS | Status: DC
  Administered 2018-06-15 – 2018-06-16 (×2): 50 ug via INTRAVENOUS
  Filled 2018-06-15 (×3): qty 5

## 2018-06-15 MED ORDER — DIPHENOXYLATE-ATROPINE 2.5-0.025 MG PO TABS
1.0000 | ORAL_TABLET | Freq: Four times a day (QID) | ORAL | Status: DC
  Administered 2018-06-16 – 2018-06-23 (×30): 1 via ORAL
  Filled 2018-06-15 (×31): qty 1

## 2018-06-15 MED ORDER — PANTOPRAZOLE SODIUM 40 MG IV SOLR
40.0000 mg | Freq: Two times a day (BID) | INTRAVENOUS | Status: DC
Start: 2018-06-15 — End: 2018-06-17
  Administered 2018-06-15 – 2018-06-16 (×3): 40 mg via INTRAVENOUS
  Filled 2018-06-15 (×4): qty 40

## 2018-06-15 MED ORDER — ADULT MULTIVITAMIN W/MINERALS CH
1.0000 | ORAL_TABLET | Freq: Every day | ORAL | Status: DC
  Administered 2018-06-15 – 2018-06-23 (×9): 1 via ORAL
  Filled 2018-06-15 (×9): qty 1

## 2018-06-15 MED ORDER — DEXTROSE IN LACTATED RINGERS 5 % IV SOLN
INTRAVENOUS | Status: DC
  Administered 2018-06-15 – 2018-06-17 (×5): via INTRAVENOUS

## 2018-06-15 MED ORDER — FAMOTIDINE IN NACL 20-0.9 MG/50ML-% IV SOLN
20.0000 mg | Freq: Two times a day (BID) | INTRAVENOUS | Status: DC
  Administered 2018-06-15 – 2018-06-16 (×3): 20 mg via INTRAVENOUS
  Filled 2018-06-15 (×3): qty 50

## 2018-06-15 NOTE — H&P (View-Only) (Signed)
Consultation  Referring Provider:  Dr. Lynetta Mare    Primary Care Physician:  Eustaquio Maize, MD Primary Gastroenterologist: [name] [facility])       Reason for Consultation: Diarrhea, abnormal CT of the abdomen            Impression / Plan:   Impression: 1.  Pancolitis: Seen on CT, currently on Zosyn, loose stools 4-5/day with hypokalemia initially (replenished), now with recto-seal in place-continues with copious loose stool, patient does report this has been occurring for over a mos-prior to recent intubation/pressors, etc.; consider infectious etiology vs other 2.  Mild macrocytic anemia with thrombocytopenia 3.  AMS: History of substance abuse, CT head without any intracranial abnormalities 4.  Abnormal CT of the abdomen: Showing pancolitis as well as cirrhosis  Pain: 1.  Awaiting stool studies-cdiff negative; O&P, GI path panel and calprotectin pending 2.  Stopped imodium, started scheduled lomotil 1 tab q6 hours 3.  Agree with further testing for hepatitis B and C-will likely need further liver serologies in the future 4. Continue Protonix BID and Pepcid 5. Please await any further recommendations from Dr. Carlean Purl later today  Thank you for your kind consultation, we will continue to follow.  [name]  06/15/2018, 11:50 AM Pager #: [phone]    [facility] Attending   I have taken an interval history, reviewed the chart and examined the patient. I agree with the Advanced Practitioner's note, impression and recommendations.   She seems quite tender and quite ill and lab trend is worsening.  Doubt an infection - could be new IBD, could be ischemia - calprotectin NL?  Check Abd films  NPO  D5LR  unprepped flex may be tomorrow  [name], MD, [facility] 06/15/2018 7:49 PM      HPI:   Deborah Russo is a 57 y.o. female with past medical history of chronic abdominal pain, substance abuse and others listed  below, who presented to the ER 06/11/2018 for complaint of shortness of breath and altered mental status thought related to polysubstance abuse and unintentional overdose with severe electrolyte disturbance.    Today, very poor historian, describes generalized abdominal pain over the past month with an increase in bowel habits to "I don't know how many" per day which are loose.  Also nausea and general discomfort.  Patient has been using her pain medications to control this which resulted in this admission.  Currently continues to be uncomfortable.  Tells me that "nothing has changed".    Per nursing patient has a rectocele on "filling her bag all the time".    Denies fever, chills or Gi bleed.  ER/hospital course:  -Patient was found to be hypotensive in the ER and eventually required a CBC and ET tube placement for pressure support and airway protection.  CT of the abdomen revealed pancolitis; lactic acidosis and macrocytic anemia associated thrombocytopenia and no leukocytosis -Persistent diarrhea with 4-5 loose bowel movements per day  Past GI history: 07/17/2014 EGD, [name]: For anemia and acute on chronic epigastric pain; impression: No evidence of erosive reflux esophagitis, mild changes of portal gastropathy and antral gastritis with no evidence of peptic ulcer disease, duodenogastric bile reflux 01/25/2002 colonoscopy, Dr. Olevia Perches: For diarrhea, abdominal pain and bloating; assessment: Normal exam, repeat recommended in 10 years  Histories garnered from previous records.  Past Medical History:  Diagnosis Date  . Chronic abdominal pain    since approximately 1991  . Chronic migraine   .  Chronic pain   . Hypothyroidism   . Nausea, vomiting, and diarrhea    recurrent, chronic  . Pain management   . Sleep apnea    has CPAP, but doesn't wear it bc she says "i can't sleep with it on"  . Stroke [facility])     Past Surgical History:  Procedure Laterality Date  . ABDOMINAL HYSTERECTOMY      . ABDOMINAL SURGERY    . BIOPSY N/A 07/17/2014   Procedure: BIOPSY;  Surgeon: Rogene Houston, MD;  Location: [facility];  Service: Endoscopy;  Laterality: N/A;  . BREAST SURGERY Left    removed nipple  . CESAREAN SECTION    . CHOLECYSTECTOMY    . ESOPHAGOGASTRODUODENOSCOPY N/A 07/17/2014   Procedure: ESOPHAGOGASTRODUODENOSCOPY (EGD);  Surgeon: Rogene Houston, MD;  Location: [facility];  Service: Endoscopy;  Laterality: N/A;    Family History  Problem Relation Age of Onset  . Asthma Mother   . Cancer Father      Social History   Tobacco Use  . Smoking status: Current Every Day Smoker    Packs/day: 1.00    Years: 41.00    Pack years: 41.00    Types: Cigarettes  . Smokeless tobacco: Never Used  Substance Use Topics  . Alcohol use: No  . Drug use: Yes    Frequency: 2.0 times per week    Types: Marijuana, Oxycodone    Prior to Admission medications   Medication Sig Start Date End Date Taking? Authorizing Provider  amitriptyline (ELAVIL) 10 MG tablet Take 30 mg by mouth at bedtime. 05/22/18  Yes [provider]  baclofen (LIORESAL) 10 MG tablet Take 10-20 mg by mouth 3 (three) times daily as needed for muscle spasms. 06/07/18  Yes [provider]  diazepam (VALIUM) 5 MG tablet Take 0.5 tablets (2.5 mg total) by mouth 3 (three) times daily. Patient taking differently: Take 5 mg by mouth 3 (three) times daily.  06/17/17  Yes Janece Canterbury, MD  oxyCODONE (OXYCONTIN) 80 mg 12 hr tablet Take 1 tablet (80 mg total) by mouth every 12 (twelve) hours. 04/01/17  Yes Orvan Falconer, MD  oxyCODONE-acetaminophen (PERCOCET) 10-325 MG tablet Take 1 tablet by mouth 2 (two) times daily as needed (breakthrough pain).    Yes [provider]  aspirin 325 MG tablet Take 1 tablet (325 mg total) by mouth daily. 04/02/17   Orvan Falconer, MD  famotidine (PEPCID) 20 MG tablet Take 1 tablet (20 mg total) by mouth 2 (two) times daily. Patient not taking: Reported on 06/12/2018 04/27/17   Julianne Rice, MD  levothyroxine (SYNTHROID, LEVOTHROID) 200 MCG tablet TAKE 1 TABLET DAILY Patient not taking: Reported on 06/12/2018 09/07/17   Eustaquio Maize, MD  lisinopril (PRINIVIL,ZESTRIL) 10 MG tablet TAKE 1 TABLET DAILY Patient not taking: Reported on 06/12/2018 03/14/17   Eustaquio Maize, MD  omeprazole (PRILOSEC) 20 MG capsule TAKE 1 TABLET DAILY Patient not taking: Reported on 06/12/2018 02/08/17   Eustaquio Maize, MD  pantoprazole (PROTONIX) 20 MG tablet TAKE 1 TABLET ONCE DAILY Patient not taking: Reported on 06/12/2018 09/07/17   Eustaquio Maize, MD  pravastatin (PRAVACHOL) 20 MG tablet TAKE 1 TABLET DAILY Patient not taking: Reported on 06/12/2018 03/14/17   Eustaquio Maize, MD  senna-docusate (SENOKOT-S) 8.6-50 MG tablet Take 1 tablet by mouth at bedtime as needed for mild constipation. 04/01/17   Orvan Falconer, MD  sucralfate (CARAFATE) 1 g tablet TAKE  (1)  TABLET  THREE TIMES DAILY AS  NEEDED. Patient not taking: Reported on 06/12/2018 01/08/17   Eustaquio Maize, MD    Current Facility-Administered Medications  Medication Dose Route Frequency Provider Last Rate Last Dose  . 0.9 %  sodium chloride infusion  250 mL Intravenous PRN Caffie Damme, MD      . chlorhexidine (PERIDEX) 0.12 % solution 15 mL  15 mL Mouth Rinse BID Kipp Brood, MD   15 mL at 06/15/18 0056  . cholecalciferol (VITAMIN D) tablet 1,000 Units  1,000 Units Oral Daily Agarwala, Ravi, MD      . cholestyramine Lucrezia Starch) packet 4 g  4 g Oral Q8H PRN Anders Simmonds, MD   4 g at 06/13/18 0536  . [START ON 06/16/2018] cosyntropin (CORTROSYN) injection 0.25 mg  0.25 mg Intravenous Once Agarwala, Einar Grad, MD      . famotidine (PEPCID) tablet 40 mg  40 mg Oral BID Kipp Brood, MD   40 mg at 06/15/18 0057  . feeding supplement (ENSURE ENLIVE) (ENSURE ENLIVE) liquid 237 mL  237 mL Oral BID BM Agarwala, Ravi, MD   237 mL at 06/14/18 1757  . folic acid (FOLVITE) tablet 1 mg  1 mg Per Tube Daily Caffie Damme, MD   1 mg  at 06/14/18 0945  . heparin injection 5,000 Units  5,000 Units Subcutaneous Q8H Caffie Damme, MD   5,000 Units at 06/15/18 0509  . insulin aspart (novoLOG) injection 0-15 Units  0-15 Units Subcutaneous Q4H Anders Simmonds, MD   2 Units at 06/15/18 [phone]  . levothyroxine (SYNTHROID, LEVOTHROID) tablet 50 mcg  50 mcg Oral QAC breakfast Kipp Brood, MD   50 mcg at 06/15/18 0843  . loperamide (IMODIUM) capsule 4 mg  4 mg Oral PRN Kipp Brood, MD   4 mg at 06/14/18 0158  . magnesium oxide (MAG-OX) tablet 400 mg  400 mg Oral Daily Kipp Brood, MD   400 mg at 06/14/18 0945  . MEDLINE mouth rinse  15 mL Mouth Rinse q12n4p Agarwala, Ravi, MD   15 mL at 06/13/18 1644  . multivitamin with minerals tablet 1 tablet  1 tablet Oral Daily Agarwala, Ravi, MD      . pantoprazole (PROTONIX) EC tablet 40 mg  40 mg Oral BID Kipp Brood, MD   40 mg at 06/15/18 0056  . phosphorus (K PHOS NEUTRAL) tablet 1,000 mg  1,000 mg Oral TID WC & HS Kipp Brood, MD   1,000 mg at 06/15/18 0843  . potassium chloride SA (K-DUR,KLOR-CON) CR tablet 40 mEq  40 mEq Oral Q6H Rosita Fire, MD   40 mEq at 06/15/18 0420  . promethazine (PHENERGAN) injection 12.5 mg  12.5 mg Intravenous Q6H PRN Anders Simmonds, MD   12.5 mg at 06/14/18 0348  . thiamine (VITAMIN B-1) tablet 100 mg  100 mg Per Tube Daily Caffie Damme, MD   100 mg at 06/14/18 0945  . vitamin B-12 (CYANOCOBALAMIN) tablet 250 mcg  250 mcg Oral Daily Kipp Brood, MD        Allergies as of 06/11/2018 - Review Complete 06/11/2018  Allergen Reaction Noted  . Penicillins Hives 09/23/2012  . Sulfa antibiotics Hives 09/23/2012     Review of Systems:  (limited due to poor historian)  Constitutional: No fever or chills Skin: No rash  Cardiovascular: No chest pain Respiratory: No SOB Gastrointestinal: See HPI and otherwise negative Genitourinary: No dysuria Neurological: No headache Musculoskeletal: No new muscle or joint  pain Hematologic: No bleeding  Psychiatric: No  history of depression or anxiety   Physical Exam:  Vital signs in last 24 hours: Temp:  [98.4 F (36.9 C)-99.7 F (37.6 C)] 98.4 F (36.9 C) (06/27 0857) Pulse Rate:  [68-76] 70 (06/27 0857) Resp:  [12-22] 18 (06/27 0857) BP: (88-154)/(35-110) 153/79 (06/27 0857) SpO2:  [98 %-100 %] 99 % (06/27 0857) Weight:  [192 lb 0.3 oz (87.1 kg)] 192 lb 0.3 oz (87.1 kg) (06/27 0521) Last BM Date: 06/15/18 General:   Chronically ill appearing Caucasian female appears to be in NAD, Well developed, Well nourished, alert and cooperative Head:  Normocephalic and atraumatic. Eyes:   PEERL, EOMI. No icterus. Conjunctiva pink. Ears:  Normal auditory acuity. Neck:  Supple Throat: Oral cavity and pharynx without inflammation, swelling or lesion.  Lungs: Respirations even and unlabored. Lungs clear to auscultation bilaterally.   No wheezes, crackles, or rhonchi.  Heart: Normal S1, S2. No MRG. Regular rate and rhythm. No peripheral edema, cyanosis or pallor.  Abdomen:  Soft, mild distension,  Mild generalized ttp. No rebound or guarding. Normal bowel sounds. No appreciable masses or hepatomegaly. Rectal:  Not performed.  Msk:  Symmetrical without gross deformities. Peripheral pulses intact.  Extremities:  Without edema, no deformity or joint abnormality.  Neurologic:  Alert and  oriented x4;  grossly normal neurologically.  Skin:   Dry and intact without significant lesions or rashes. Psychiatric: Demonstrates good judgement and reason without abnormal affect or behaviors.   LAB RESULTS: BMET Recent Labs    06/14/18 0415 06/14/18 0417 06/14/18 1813 06/15/18 0741  NA 135  --  137 139  K 3.5  --  3.7 4.4  CL 103  --  108 109  CO2 25  --  23 20*  GLUCOSE 197*  --  129* 129*  BUN <5*  --  <5* <5*  CREATININE 1.05*  --  1.00 0.93  CALCIUM 7.5* 7.4*  7.2* 7.8* 7.9*   LFT Recent Labs    06/15/18 0741  ALBUMIN 2.6*

## 2018-06-15 NOTE — Care Management Note (Addendum)
Case Management Note  Patient Details  Name: Deborah Russo MRN: [id number] Date of Birth: 1961/09/04  Subjective/Objective:            Patient admitted w possible unintentional overdose, overmedicating self to treat abd pain. Continue w diarrhea, unclear etiology at this time and poor po intake.  Patient is followed inpatient by RD and GI.  Attempt to reach family to clarify social/ living situation. Only answer was from [place]. Per her, patient lives with her daughter Andee Poles and her sister Loletha Carrow. She states that the contents of the house belong patient but the house in in [place] name. She stated that their problem is mostly Andee Poles who is "really into drugs." She provides that Loletha Carrow is on disability and they use her food stamps for food. They mostly stay in the house all the time, they do not have a car.  She said the house is not well kept but in her opinion it was safe to live in.  She states that patient is [facility] for [facility] prescriptions and she feels she is not using them correctly. She states that patient usually gets rides from others to get her MD appointments Coverage Medicare PCP Melvyn Novas,  [place]    Martin,Vickie Sister [phone]    [place] [phone]  [phone]  Yoshika, [place] Daughter [phone]    Sharen Hones Sister [phone]            Action/Plan:  CM will continue to follow.   Expected Discharge Date:                  Expected Discharge Plan:     In-House Referral:     Discharge planning Services     Post Acute Care Choice:    Choice offered to:     DME Arranged:    DME Agency:     HH Arranged:    HH Agency:     Status of Service:     If discussed at [employer], dates discussed:    Additional Comments:  Carles Collet, RN 06/15/2018, 2:59 PM

## 2018-06-15 NOTE — Progress Notes (Signed)
Patientt is refusing to take her p.o medications this evening and will only drink very minimal water. Will continue to encourage. Dorthey Sawyer, RN

## 2018-06-15 NOTE — Consult Note (Addendum)
Consultation  Referring Provider:  Dr. Lynetta Mare    Primary Care Physician:  Eustaquio Maize, MD Primary Gastroenterologist: [name] [facility])       Reason for Consultation: Diarrhea, abnormal CT of the abdomen            Impression / Plan:   Impression: 1.  Pancolitis: Seen on CT, currently on Zosyn, loose stools 4-5/day with hypokalemia initially (replenished), now with recto-seal in place-continues with copious loose stool, patient does report this has been occurring for over a mos-prior to recent intubation/pressors, etc.; consider infectious etiology vs other 2.  Mild macrocytic anemia with thrombocytopenia 3.  AMS: History of substance abuse, CT head without any intracranial abnormalities 4.  Abnormal CT of the abdomen: Showing pancolitis as well as cirrhosis  Pain: 1.  Awaiting stool studies-cdiff negative; O&P, GI path panel and calprotectin pending 2.  Stopped imodium, started scheduled lomotil 1 tab q6 hours 3.  Agree with further testing for hepatitis B and C-will likely need further liver serologies in the future 4. Continue Protonix BID and Pepcid 5. Please await any further recommendations from Dr. Carlean Purl later today  Thank you for your kind consultation, we will continue to follow.  [name]  06/15/2018, 11:50 AM Pager #: [phone]    [facility] Attending   I have taken an interval history, reviewed the chart and examined the patient. I agree with the Advanced Practitioner's note, impression and recommendations.   She seems quite tender and quite ill and lab trend is worsening.  Doubt an infection - could be new IBD, could be ischemia - calprotectin NL?  Check Abd films  NPO  D5LR  unprepped flex may be tomorrow  [name], MD, [facility] 06/15/2018 7:49 PM      HPI:   Deborah Russo is a 57 y.o. female with past medical history of chronic abdominal pain, substance abuse and others listed  below, who presented to the ER 06/11/2018 for complaint of shortness of breath and altered mental status thought related to polysubstance abuse and unintentional overdose with severe electrolyte disturbance.    Today, very poor historian, describes generalized abdominal pain over the past month with an increase in bowel habits to "I don't know how many" per day which are loose.  Also nausea and general discomfort.  Patient has been using her pain medications to control this which resulted in this admission.  Currently continues to be uncomfortable.  Tells me that "nothing has changed".    Per nursing patient has a rectocele on "filling her bag all the time".    Denies fever, chills or Gi bleed.  ER/hospital course:  -Patient was found to be hypotensive in the ER and eventually required a CBC and ET tube placement for pressure support and airway protection.  CT of the abdomen revealed pancolitis; lactic acidosis and macrocytic anemia associated thrombocytopenia and no leukocytosis -Persistent diarrhea with 4-5 loose bowel movements per day  Past GI history: 07/17/2014 EGD, [name]: For anemia and acute on chronic epigastric pain; impression: No evidence of erosive reflux esophagitis, mild changes of portal gastropathy and antral gastritis with no evidence of peptic ulcer disease, duodenogastric bile reflux 01/25/2002 colonoscopy, Dr. Olevia Perches: For diarrhea, abdominal pain and bloating; assessment: Normal exam, repeat recommended in 10 years  Histories garnered from previous records.  Past Medical History:  Diagnosis Date  . Chronic abdominal pain    since approximately 1991  . Chronic migraine   .  Chronic pain   . Hypothyroidism   . Nausea, vomiting, and diarrhea    recurrent, chronic  . Pain management   . Sleep apnea    has CPAP, but doesn't wear it bc she says "i can't sleep with it on"  . Stroke [facility])     Past Surgical History:  Procedure Laterality Date  . ABDOMINAL HYSTERECTOMY      . ABDOMINAL SURGERY    . BIOPSY N/A 07/17/2014   Procedure: BIOPSY;  Surgeon: Rogene Houston, MD;  Location: [facility];  Service: Endoscopy;  Laterality: N/A;  . BREAST SURGERY Left    removed nipple  . CESAREAN SECTION    . CHOLECYSTECTOMY    . ESOPHAGOGASTRODUODENOSCOPY N/A 07/17/2014   Procedure: ESOPHAGOGASTRODUODENOSCOPY (EGD);  Surgeon: Rogene Houston, MD;  Location: [facility];  Service: Endoscopy;  Laterality: N/A;    Family History  Problem Relation Age of Onset  . Asthma Mother   . Cancer Father      Social History   Tobacco Use  . Smoking status: Current Every Day Smoker    Packs/day: 1.00    Years: 41.00    Pack years: 41.00    Types: Cigarettes  . Smokeless tobacco: Never Used  Substance Use Topics  . Alcohol use: No  . Drug use: Yes    Frequency: 2.0 times per week    Types: Marijuana, Oxycodone    Prior to Admission medications   Medication Sig Start Date End Date Taking? Authorizing Provider  amitriptyline (ELAVIL) 10 MG tablet Take 30 mg by mouth at bedtime. 05/22/18  Yes [provider]  baclofen (LIORESAL) 10 MG tablet Take 10-20 mg by mouth 3 (three) times daily as needed for muscle spasms. 06/07/18  Yes [provider]  diazepam (VALIUM) 5 MG tablet Take 0.5 tablets (2.5 mg total) by mouth 3 (three) times daily. Patient taking differently: Take 5 mg by mouth 3 (three) times daily.  06/17/17  Yes Janece Canterbury, MD  oxyCODONE (OXYCONTIN) 80 mg 12 hr tablet Take 1 tablet (80 mg total) by mouth every 12 (twelve) hours. 04/01/17  Yes Orvan Falconer, MD  oxyCODONE-acetaminophen (PERCOCET) 10-325 MG tablet Take 1 tablet by mouth 2 (two) times daily as needed (breakthrough pain).    Yes [provider]  aspirin 325 MG tablet Take 1 tablet (325 mg total) by mouth daily. 04/02/17   Orvan Falconer, MD  famotidine (PEPCID) 20 MG tablet Take 1 tablet (20 mg total) by mouth 2 (two) times daily. Patient not taking: Reported on 06/12/2018 04/27/17   Julianne Rice, MD  levothyroxine (SYNTHROID, LEVOTHROID) 200 MCG tablet TAKE 1 TABLET DAILY Patient not taking: Reported on 06/12/2018 09/07/17   Eustaquio Maize, MD  lisinopril (PRINIVIL,ZESTRIL) 10 MG tablet TAKE 1 TABLET DAILY Patient not taking: Reported on 06/12/2018 03/14/17   Eustaquio Maize, MD  omeprazole (PRILOSEC) 20 MG capsule TAKE 1 TABLET DAILY Patient not taking: Reported on 06/12/2018 02/08/17   Eustaquio Maize, MD  pantoprazole (PROTONIX) 20 MG tablet TAKE 1 TABLET ONCE DAILY Patient not taking: Reported on 06/12/2018 09/07/17   Eustaquio Maize, MD  pravastatin (PRAVACHOL) 20 MG tablet TAKE 1 TABLET DAILY Patient not taking: Reported on 06/12/2018 03/14/17   Eustaquio Maize, MD  senna-docusate (SENOKOT-S) 8.6-50 MG tablet Take 1 tablet by mouth at bedtime as needed for mild constipation. 04/01/17   Orvan Falconer, MD  sucralfate (CARAFATE) 1 g tablet TAKE  (1)  TABLET  THREE TIMES DAILY AS  NEEDED. Patient not taking: Reported on 06/12/2018 01/08/17   Eustaquio Maize, MD    Current Facility-Administered Medications  Medication Dose Route Frequency Provider Last Rate Last Dose  . 0.9 %  sodium chloride infusion  250 mL Intravenous PRN Caffie Damme, MD      . chlorhexidine (PERIDEX) 0.12 % solution 15 mL  15 mL Mouth Rinse BID Kipp Brood, MD   15 mL at 06/15/18 0056  . cholecalciferol (VITAMIN D) tablet 1,000 Units  1,000 Units Oral Daily Agarwala, Ravi, MD      . cholestyramine Lucrezia Starch) packet 4 g  4 g Oral Q8H PRN Anders Simmonds, MD   4 g at 06/13/18 0536  . [START ON 06/16/2018] cosyntropin (CORTROSYN) injection 0.25 mg  0.25 mg Intravenous Once Agarwala, Einar Grad, MD      . famotidine (PEPCID) tablet 40 mg  40 mg Oral BID Kipp Brood, MD   40 mg at 06/15/18 0057  . feeding supplement (ENSURE ENLIVE) (ENSURE ENLIVE) liquid 237 mL  237 mL Oral BID BM Agarwala, Ravi, MD   237 mL at 06/14/18 1757  . folic acid (FOLVITE) tablet 1 mg  1 mg Per Tube Daily Caffie Damme, MD   1 mg  at 06/14/18 0945  . heparin injection 5,000 Units  5,000 Units Subcutaneous Q8H Caffie Damme, MD   5,000 Units at 06/15/18 0509  . insulin aspart (novoLOG) injection 0-15 Units  0-15 Units Subcutaneous Q4H Anders Simmonds, MD   2 Units at 06/15/18 [phone]  . levothyroxine (SYNTHROID, LEVOTHROID) tablet 50 mcg  50 mcg Oral QAC breakfast Kipp Brood, MD   50 mcg at 06/15/18 0843  . loperamide (IMODIUM) capsule 4 mg  4 mg Oral PRN Kipp Brood, MD   4 mg at 06/14/18 0158  . magnesium oxide (MAG-OX) tablet 400 mg  400 mg Oral Daily Kipp Brood, MD   400 mg at 06/14/18 0945  . MEDLINE mouth rinse  15 mL Mouth Rinse q12n4p Agarwala, Ravi, MD   15 mL at 06/13/18 1644  . multivitamin with minerals tablet 1 tablet  1 tablet Oral Daily Agarwala, Ravi, MD      . pantoprazole (PROTONIX) EC tablet 40 mg  40 mg Oral BID Kipp Brood, MD   40 mg at 06/15/18 0056  . phosphorus (K PHOS NEUTRAL) tablet 1,000 mg  1,000 mg Oral TID WC & HS Kipp Brood, MD   1,000 mg at 06/15/18 0843  . potassium chloride SA (K-DUR,KLOR-CON) CR tablet 40 mEq  40 mEq Oral Q6H Rosita Fire, MD   40 mEq at 06/15/18 0420  . promethazine (PHENERGAN) injection 12.5 mg  12.5 mg Intravenous Q6H PRN Anders Simmonds, MD   12.5 mg at 06/14/18 0348  . thiamine (VITAMIN B-1) tablet 100 mg  100 mg Per Tube Daily Caffie Damme, MD   100 mg at 06/14/18 0945  . vitamin B-12 (CYANOCOBALAMIN) tablet 250 mcg  250 mcg Oral Daily Kipp Brood, MD        Allergies as of 06/11/2018 - Review Complete 06/11/2018  Allergen Reaction Noted  . Penicillins Hives 09/23/2012  . Sulfa antibiotics Hives 09/23/2012     Review of Systems:  (limited due to poor historian)  Constitutional: No fever or chills Skin: No rash  Cardiovascular: No chest pain Respiratory: No SOB Gastrointestinal: See HPI and otherwise negative Genitourinary: No dysuria Neurological: No headache Musculoskeletal: No new muscle or joint  pain Hematologic: No bleeding  Psychiatric: No  history of depression or anxiety   Physical Exam:  Vital signs in last 24 hours: Temp:  [98.4 F (36.9 C)-99.7 F (37.6 C)] 98.4 F (36.9 C) (06/27 0857) Pulse Rate:  [68-76] 70 (06/27 0857) Resp:  [12-22] 18 (06/27 0857) BP: (88-154)/(35-110) 153/79 (06/27 0857) SpO2:  [98 %-100 %] 99 % (06/27 0857) Weight:  [192 lb 0.3 oz (87.1 kg)] 192 lb 0.3 oz (87.1 kg) (06/27 0521) Last BM Date: 06/15/18 General:   Chronically ill appearing Caucasian female appears to be in NAD, Well developed, Well nourished, alert and cooperative Head:  Normocephalic and atraumatic. Eyes:   PEERL, EOMI. No icterus. Conjunctiva pink. Ears:  Normal auditory acuity. Neck:  Supple Throat: Oral cavity and pharynx without inflammation, swelling or lesion.  Lungs: Respirations even and unlabored. Lungs clear to auscultation bilaterally.   No wheezes, crackles, or rhonchi.  Heart: Normal S1, S2. No MRG. Regular rate and rhythm. No peripheral edema, cyanosis or pallor.  Abdomen:  Soft, mild distension,  Mild generalized ttp. No rebound or guarding. Normal bowel sounds. No appreciable masses or hepatomegaly. Rectal:  Not performed.  Msk:  Symmetrical without gross deformities. Peripheral pulses intact.  Extremities:  Without edema, no deformity or joint abnormality.  Neurologic:  Alert and  oriented x4;  grossly normal neurologically.  Skin:   Dry and intact without significant lesions or rashes. Psychiatric: Demonstrates good judgement and reason without abnormal affect or behaviors.   LAB RESULTS: BMET Recent Labs    06/14/18 0415 06/14/18 0417 06/14/18 1813 06/15/18 0741  NA 135  --  137 139  K 3.5  --  3.7 4.4  CL 103  --  108 109  CO2 25  --  23 20*  GLUCOSE 197*  --  129* 129*  BUN <5*  --  <5* <5*  CREATININE 1.05*  --  1.00 0.93  CALCIUM 7.5* 7.4*  7.2* 7.8* 7.9*   LFT Recent Labs    06/15/18 0741  ALBUMIN 2.6*

## 2018-06-15 NOTE — Progress Notes (Signed)
eLink Physician-Brief Progress Note Patient Name: RODNISHA BLOMGREN DOB: 08-19-1961 MRN: [id number]   Date of Service  06/15/2018  HPI/Events of Note  Gastroenterology recommended npo status  eICU Interventions  Synthroid/Protonix/Pepcid/Thiamine switched to iv Oral phosphorus d/c'd due to likely exacerbation of diarrhoea        Frederik Pear 06/15/2018, 9:29 PM

## 2018-06-15 NOTE — Progress Notes (Signed)
Follow up - Critical Care Medicine Note  Patient Details:    Deborah Russo is an 57 y.o. female.  She was admitted June 23 for altered mental status thought to be due to polysubstance abuse.  Had a similar presentation one year ago.  Was also found to be hypotensive.Was intubated for airway protection and received fluid resuscitation.  Reports long history of abdominal pain treated with narcotics, persistent nausea vomiting and profuse diarrhea. Reportedly had not been eating or drinking normally and taking none of her medication   Best Practice/Protocols:  VTE Prophylaxis: Lovenox (prophylaxtic dose)  Events: Extubated 06/12/2018  Studies: 6/23: CT abdomen severe pancolitis.  Hepatic steatosis with evidence of cirrhosis.  Aortic arterial sclerosis with high-grade stenosis of the left common iliac artery CT head 6/23: Negative  Consults: Nephrology    Subjective:    Overnight Issues: Transferred to [place], no issues overnight  Objective:  Vital signs for last 24 hours: Temp:  [98.4 F (36.9 C)-99.7 F (37.6 C)] 98.4 F (36.9 C) (06/27 0857) Pulse Rate:  [68-84] 70 (06/27 0857) Resp:  [12-22] 18 (06/27 0857) BP: (88-154)/(35-110) 153/79 (06/27 0857) SpO2:  [98 %-100 %] 99 % (06/27 0857) Weight:  [192 lb 0.3 oz (87.1 kg)] 192 lb 0.3 oz (87.1 kg) (06/27 0521)   Intake/Output Summary (Last 24 hours) at 06/15/2018 0937 Last data filed at 06/15/2018 0600 Gross per 24 hour  Intake 1594.96 ml  Output 937.5 ml  Net 657.46 ml    Physical Exam:  General: This chronically ill-appearing 57 year old female she is currently resting in bed, mildly confused, no acute distress HEENT normocephalic atraumatic mucous membranes moist nonicteric sclera Pulmonary: Clear to auscultation diminished bases Cardiac: Regular rate and rhythm Abdomen: Soft, nontender, continuous liquid stool GU: Clear yellow Neuro: Awake, oriented x2, affect flat, moves all extremities, no focal motor  deficits  CBC No results for input(s): WBC, HGB, HCT, PLT in the last 72 hours.  Coag's No results for input(s): APTT, INR in the last 72 hours.  BMET Recent Labs    06/14/18 0415 06/14/18 1813 06/15/18 0741  NA 135 137 139  K 3.5 3.7 4.4  CL 103 108 109  CO2 25 23 20*  BUN <5* <5* <5*  CREATININE 1.05* 1.00 0.93  GLUCOSE 197* 129* 129*    Electrolytes Recent Labs    06/13/18 0349  06/14/18 0415 06/14/18 0417 06/14/18 1813 06/15/18 0741  CALCIUM 7.8*   < > 7.5* 7.4*  7.2* 7.8* 7.9*  MG 2.0  --   --  1.8  --  2.2  PHOS  --   --  1.3*  --  1.1* 1.7*   < > = values in this interval not displayed.    Sepsis Markers No results for input(s): PROCALCITON, O2SATVEN in the last 72 hours.  Invalid input(s): LACTICACIDVEN  ABG No results for input(s): PHART, PCO2ART, PO2ART in the last 72 hours.  Liver Enzymes Recent Labs    06/14/18 0415 06/14/18 1813 06/15/18 0741  ALBUMIN 2.3* 2.4* 2.6*    Cardiac Enzymes No results for input(s): TROPONINI, PROBNP in the last 72 hours.  Glucose Recent Labs    06/14/18 1753 06/14/18 2027 06/14/18 2231 06/15/18 0033 06/15/18 0500 06/15/18 0758  GLUCAP 132* 132* 115* 145* 133* 119*    Imaging No results found.   Assessment/Plan:   NEURO  Mild Delirium. Ill-defined chronic pain syndrome. Chronic narcotic use may be contributing to current admission.    Plan:  Continuing supportive care Limit  narcotics Check ammonia level  PULM  Repiratory failure has resolved.    Plan:  Pulmonary hygiene Mobilize Pulse oximetry spot checks  CARDIO  Hypotension requiring NE to maintain MAP, likely due to volume contraction and hypothyroidism.  This is resolved now off pressors.  Random cortisols borderline at 19.2 but I think given the fact that hypotension is resolved we can forego stress dose supplementation   Plan:  Continue to monitor blood pressure Continue to hold her home Prinivil See endocrine section  RENAL   Persistent hypokalemia and volume contraction of unclear etiology.  Appears to be extrarenal cause.Suspect this is secondary to GI loss with 4-5 loose stools a day in abdominal colitis    Plan:  Intermittent chemistry checks  GI  Colitis on CT with history of chronic epigastric pain.   Profuse diarrhea - secretory vs malabsorptive. Rule out inflammatory causes.  CT also shows evidence of cirrhosis NASH vs alcoholic cirrhosis.     Plan:  Continuing antidiarrheal agents Follow-up diarrheal studies Ruling out viral hepatitis We will request GI consultation   ID  Colitis of possible ischemic vs infectious etiology. Borderline PCT.  Antibiotics discontinued   Plan:  Trend fever and white blood cell curve  HEME  Leukocytosis (reactive.)   Plan:  Trend CBC  ENDO adequate glycemic control. Severe hypothyroidism, possible relative adrenal insufficiency   Plan Continue Synthroid replacement Follow-up cosyntropin stim test Holding off on Steroid replacement   Global Issues  Clinically stable.  Critical care will sign off.   LOS: 4 days   Erick Colace ACNP-BC [place] Pager # [phone] OR # [phone] if no answer

## 2018-06-16 ENCOUNTER — Encounter (HOSPITAL_COMMUNITY)
Admission: EM | Disposition: A | Discharge status: Skilled Nursing Facility | Payer: Self-pay | Source: Home / Self Care | Attending: Internal Medicine

## 2018-06-16 ENCOUNTER — Encounter (HOSPITAL_COMMUNITY): Payer: Self-pay | Admitting: *Deleted

## 2018-06-16 DIAGNOSIS — G9341 Metabolic encephalopathy: Secondary | ICD-10-CM

## 2018-06-16 DIAGNOSIS — R197 Diarrhea, unspecified: Secondary | ICD-10-CM

## 2018-06-16 DIAGNOSIS — K6289 Other specified diseases of anus and rectum: Secondary | ICD-10-CM

## 2018-06-16 HISTORY — PX: FLEXIBLE SIGMOIDOSCOPY: SHX5431

## 2018-06-16 HISTORY — PX: BIOPSY: SHX5522

## 2018-06-16 LAB — CULTURE, BLOOD (ROUTINE X 2)
CULTURE: NO GROWTH
CULTURE: NO GROWTH
Culture: NO GROWTH
Culture: NO GROWTH
SPECIAL REQUESTS: ADEQUATE
SPECIAL REQUESTS: ADEQUATE
Special Requests: ADEQUATE
Special Requests: ADEQUATE

## 2018-06-16 LAB — HEPATITIS B SURFACE ANTIGEN: HEP B S AG: NEGATIVE

## 2018-06-16 LAB — GLUCOSE, CAPILLARY
GLUCOSE-CAPILLARY: 103 mg/dL — AB (ref 70–99)
Glucose-Capillary: 156 mg/dL — ABNORMAL HIGH (ref 70–99)
Glucose-Capillary: 175 mg/dL — ABNORMAL HIGH (ref 70–99)
Glucose-Capillary: 148 mg/dL — ABNORMAL HIGH (ref 70–99)
Glucose-Capillary: 120 mg/dL — ABNORMAL HIGH (ref 70–99)

## 2018-06-16 LAB — GASTROINTESTINAL PANEL BY PCR, STOOL (REPLACES STOOL CULTURE)
ADENOVIRUS F40/41: NOT DETECTED
Astrovirus: NOT DETECTED
CAMPYLOBACTER SPECIES: NOT DETECTED
Cryptosporidium: NOT DETECTED
Cyclospora cayetanensis: NOT DETECTED
ENTEROAGGREGATIVE E COLI (EAEC): NOT DETECTED
ENTEROPATHOGENIC E COLI (EPEC): NOT DETECTED
Entamoeba histolytica: NOT DETECTED
Enterotoxigenic E coli (ETEC): NOT DETECTED
GIARDIA LAMBLIA: NOT DETECTED
NOROVIRUS GI/GII: NOT DETECTED
PLESIMONAS SHIGELLOIDES: NOT DETECTED
ROTAVIRUS A: NOT DETECTED
SHIGELLA/ENTEROINVASIVE E COLI (EIEC): NOT DETECTED
Salmonella species: NOT DETECTED
Sapovirus (I, II, IV, and V): NOT DETECTED
Shiga like toxin producing E coli (STEC): NOT DETECTED
Vibrio cholerae: NOT DETECTED
Vibrio species: NOT DETECTED
Yersinia enterocolitica: NOT DETECTED

## 2018-06-16 LAB — HCV AB W REFLEX TO QUANT PCR: HCV Ab: <0.1 s/co ratio (ref 0.0–0.9)

## 2018-06-16 LAB — HCV INTERPRETATION

## 2018-06-16 LAB — HEPATITIS B SURFACE ANTIBODY,QUALITATIVE: HEP B S AB: REACTIVE

## 2018-06-16 SURGERY — SIGMOIDOSCOPY, FLEXIBLE
Anesthesia: Moderate Sedation

## 2018-06-16 MED ORDER — FENTANYL CITRATE (PF) 100 MCG/2ML IJ SOLN
INTRAMUSCULAR | Status: AC
  Filled 2018-06-16: qty 2

## 2018-06-16 MED ORDER — MIDAZOLAM HCL 5 MG/ML IJ SOLN
INTRAMUSCULAR | Status: AC
  Filled 2018-06-16: qty 2

## 2018-06-16 MED ORDER — MIDAZOLAM HCL 10 MG/2ML IJ SOLN
INTRAMUSCULAR | Status: DC | PRN
  Administered 2018-06-16: 1 mg via INTRAVENOUS
  Administered 2018-06-16: 2 mg via INTRAVENOUS

## 2018-06-16 MED ORDER — DIPHENHYDRAMINE HCL 50 MG/ML IJ SOLN
INTRAMUSCULAR | Status: AC
  Filled 2018-06-16: qty 1

## 2018-06-16 MED ORDER — LORAZEPAM 2 MG/ML IJ SOLN
1.0000 mg | Freq: Once | INTRAMUSCULAR | Status: AC
  Administered 2018-06-16: 1 mg via INTRAVENOUS
  Filled 2018-06-16: qty 1

## 2018-06-16 MED ORDER — FENTANYL CITRATE (PF) 100 MCG/2ML IJ SOLN
INTRAMUSCULAR | Status: DC | PRN
  Administered 2018-06-16: 25 ug via INTRAVENOUS

## 2018-06-16 MED ORDER — SODIUM CHLORIDE 0.9 % IV SOLN: INTRAVENOUS | Status: DC

## 2018-06-16 NOTE — Op Note (Signed)
[facility] Patient Name: Deborah Russo Procedure Date : 06/16/2018 MRN: [id number] Attending MD: [name] , MD Date of Birth: 08-04-1961 CSN: [id number] Age: 57 Admit Type: Inpatient Procedure:                Flexible Sigmoidoscopy Indications:              Diarrhea Providers:                [name], MD, Cleda Daub, RN, Tinnie Gens, Technician, Alan Mulder, Technician Referring MD:              Medicines:                Midazolam 3 mg IV, Fentanyl 25 micrograms IV Complications:            No immediate complications. Estimated Blood Loss:     Estimated blood loss was minimal. Procedure:                Pre-Anesthesia Assessment:                           - Prior to the procedure, a History and Physical                            was performed, and patient medications and                            allergies were reviewed. The patient's tolerance of                            previous anesthesia was also reviewed. The risks                            and benefits of the procedure and the sedation                            options and risks were discussed with the patient.                            All questions were answered, and informed consent                            was obtained. Prior Anticoagulants: The patient has                            taken no previous anticoagulant or antiplatelet                            agents. ASA Grade Assessment: III - A patient with                            severe systemic disease. After reviewing the risks  and benefits, the patient was deemed in                            satisfactory condition to undergo the procedure.                           After obtaining informed consent, the scope was                            passed under direct vision. The [id number] ([id number])                            scope was introduced through the anus and advanced                         to the the descending colon. The flexible                            sigmoidoscopy was accomplished without difficulty.                            The patient tolerated the procedure well. The                            quality of the bowel preparation was good. Scope In: Scope Out: Findings:      The perianal exam findings include a perianal rash.      The digital rectal exam findings include rectal tenderness.      A diffuse area of mildly congested and vascular-pattern-decreased mucosa       was found in the rectum, in the sigmoid colon and in the descending       colon. Biopsies were taken with a cold forceps for histology.       Verification of patient identification for the specimen was done.       Estimated blood loss was minimal. Impression:               - Perianal rash found on perianal exam.                           - Rectal tenderness found on digital rectal exam.                           - Congested and vascular-pattern-decreased mucosa                            in the rectum, in the sigmoid colon and in the                            descending colon. Biopsied. Moderate Sedation:      Moderate (conscious) sedation was administered by the endoscopy nurse       and supervised by the endoscopist. The following parameters were       monitored: oxygen saturation, heart rate, blood pressure, respiratory       rate, EKG, adequacy of pulmonary ventilation, and response to care. Recommendation:           -  Return patient to hospital ward for ongoing care.                            Await bxs - stop enteric precautions all infectious                            tests neg                           ? micro colitis -                           I hjave seen low protein and edema of bowel do this                            but her albumin not that bad??? Procedure Code(s):        --- Professional ---                           [phone], Sigmoidoscopy, flexible; with  biopsy, single                            or multiple Diagnosis Code(s):        --- Professional ---                           R21, Rash and other nonspecific skin eruption                           K62.89, Other specified diseases of anus and rectum                           K63.89, Other specified diseases of intestine                           R19.7, Diarrhea, unspecified CPT copyright 2017 [employer]. All rights reserved. The codes documented in this report are preliminary and upon coder review may  be revised to meet current compliance requirements. [name], MD 06/16/2018 3:27:52 PM This report has been signed electronically. Number of Addenda: 0

## 2018-06-16 NOTE — Progress Notes (Signed)
   GI pathogen panel not back  I am inclined to do a flex sig no prep later today.  We will be by to obtain consent.  I stopped cholestyramine  OK to leave on SQ heparin  [name], MD, [facility] 06/16/2018 8:26 AM

## 2018-06-16 NOTE — Interval H&P Note (Signed)
History and Physical Interval Note:  06/16/2018 2:53 PM  Deborah Russo  has presented today for surgery, with the diagnosis of diarrhea, colitis  The various methods of treatment have been discussed with the patient and family. After consideration of risks, benefits and other options for treatment, the patient has consented to  Procedure(s): FLEXIBLE SIGMOIDOSCOPY (N/A) as a surgical intervention .  The patient's history has been reviewed, patient examined, no change in status, stable for surgery.  I have reviewed the patient's chart and labs.  Questions were answered to the patient's satisfaction.     Silvano Rusk

## 2018-06-16 NOTE — Progress Notes (Signed)
PROGRESS NOTE  Deborah Russo BHA:[id number] DOB: Oct 18, 1961 DOA: 06/11/2018 PCP: Eustaquio Maize, MD  Brief summary:  Deborah Russo is an 57 y.o. female.  She was admitted June 23 for altered mental status thought to be due to polysubstance abuse.  Had a similar presentation one year ago.  Was also found to be hypotensive.Was intubated for airway protection and received fluid resuscitation.  Reports long history of abdominal pain treated with narcotics, persistent nausea vomiting and profuse diarrhea. Reportedly had not been eating or drinking normally and taking none of her medication   She  is extubated on June 24 and off pressors on 6/26 ,she is  transferred to hospitalist service June 28     HPI/Recap of past 24 hours:  D/c abdominal pain, she has a rectal tube in with loose stool No fever Foley has been in since 6/22 per RN  Assessment/Plan: Active Problems:   Hypotension   Colitis   Cirrhosis of liver without ascites ([place])  Pancolitis:  -patient reports chronic Abdominal pain/ chronic diarrhea, she reports she is on pain meds for chronic abdominal pain -she presented with severe electrolytes abnormalities,  -gi pcr panel negative -gi consulted, she is to have sigmoidoscope today, will follow gi recommendation  Electrolytes abnormalities: Has been replaced, repeat in am  Encephalopathy: - she remain lethargic and confused,  -Ct head no acute findings, 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 -supportive care   Hypothyroidism:  tsh on presentation was 80  Low b12, started on supplement  Body mass index is 35.12 kg/m.   FTT  Code Status: full  Family Communication: patient   Disposition Plan: not ready to discharge   Consultants:  GI  Procedures:  Intubation/extubation  Sigmoidoscope  Antibiotics:  Zosyn from 6/23 to 6/27   Objective: BP (!) 159/80 (BP Location: Right Arm)   Pulse 75   Temp 99.3 F (37.4 C) (Oral)   Resp  20   Ht 5' 2"  (1.575 m)   Wt 87.1 kg (192 lb 0.3 oz)   SpO2 99%   BMI 35.12 kg/m   Intake/Output Summary (Last 24 hours) at 06/16/2018 9735 Last data filed at 06/16/2018 0601 Gross per 24 hour  Intake 1203.46 ml  Output 1050 ml  Net 153.46 ml   Filed Weights   06/13/18 0341 06/14/18 0350 06/15/18 0521  Weight: 84.4 kg (186 lb 1.1 oz) 84.7 kg (186 lb 11.7 oz) 87.1 kg (192 lb 0.3 oz)    Exam: Patient is examined daily including today on 06/16/2018, exams remain the same as of yesterday except that has changed    General:  Lethargic, oriented to person, know she is in the hospital, not able to name it, she is not oriented to time  Cardiovascular: RRR  Respiratory: CTABL  Abdomen: Soft/ND/NT, positive BS  Musculoskeletal: No Edema  Neuro: alert, oriented   Data Reviewed: Basic Metabolic Panel: Recent Labs  Lab 06/12/18 0452 06/12/18 1330 06/13/18 0349 06/13/18 1704 06/14/18 0415 06/14/18 0417 06/14/18 1813 06/15/18 0741 06/15/18 1940  NA 133* 132* 133* 135 135  --  137 139 136  K <2.0* 3.0* 2.5* 2.8* 3.5  --  3.7 4.4 4.9  CL 101 98* 100 102 103  --  108 109 108  CO2 22 21* 24 25 25   --  23 20* 20*  GLUCOSE 262* 265* 148* 99 197*  --  129* 129* 134*  BUN 6 <5* <5* <5* <5*  --  <5* <5* <5*  CREATININE  0.96 0.96 0.99 0.95 1.05*  --  1.00 0.93 0.98  CALCIUM 7.3* 7.6* 7.8* 7.6* 7.5* 7.4*  7.2* 7.8* 7.9* 7.9*  MG 1.8 1.9 2.0  --   --  1.8  --  2.2  --   PHOS 2.3*  --   --   --  1.3*  --  1.1* 1.7* 2.6   Liver Function Tests: Recent Labs  Lab 06/11/18 1238 06/11/18 2114 06/14/18 0415 06/14/18 1813 06/15/18 0741 06/15/18 1940  AST 19 23  --   --   --   --   ALT 11* 13*  --   --   --   --   ALKPHOS 66 87  --   --   --   --   BILITOT 0.7 0.8  --   --   --   --   PROT 4.0* 5.3*  --   --   --   --   ALBUMIN 2.0* 2.6* 2.3* 2.4* 2.6* 2.6*   Recent Labs  Lab 06/11/18 2114  LIPASE 30   No results for input(s): AMMONIA in the last 168 hours. CBC: Recent Labs   Lab 06/11/18 1141 06/11/18 2114 06/12/18 0452  WBC 6.3 15.2* 17.8*  NEUTROABS 4.6 11.7*  --   HGB 10.8* 9.9* 10.4*  HCT 29.9* 27.1* 29.4*  MCV 102.0* 101.1* 100.7*  PLT 78* 90* 104*   Cardiac Enzymes:   Recent Labs  Lab 06/11/18 1238 06/11/18 2114 06/12/18 0102 06/12/18 0452 06/12/18 1031  CKTOTAL  --   --   --   --  274*  TROPONINI 0.08* 1.17* 1.20* 1.20*  --    BNP (last 3 results) Recent Labs    06/11/18 2114  BNP 226.2*    ProBNP (last 3 results) No results for input(s): PROBNP in the last 8760 hours.  CBG: Recent Labs  Lab 06/15/18 1714 06/15/18 2032 06/15/18 2346 06/16/18 0359 06/16/18 0751  GLUCAP 156* 115* 114* 156* 175*    Recent Results (from the past 240 hour(s))  Urine culture     Status: None   Collection Time: 06/11/18 11:37 AM  Result Value Ref Range Status   Specimen Description   Final    URINE, CLEAN CATCH Performed at [facility], [address] [place], [place] [address]    Special Requests   Final    NONE Performed at [facility], [address] [place], [place] [address]    Culture   Final    NO GROWTH Performed at [facility], [place] [address], [place], [place] [address]    Report Status 06/13/2018 FINAL  Final  Blood Culture (routine x 2)     Status: None   Collection Time: 06/11/18 11:41 AM  Result Value Ref Range Status   Specimen Description BLOOD LEFT ARM  Final   Special Requests   Final    BOTTLES DRAWN AEROBIC ONLY Blood Culture adequate volume   Culture   Final    NO GROWTH 5 DAYS Performed at [facility], [address], [place],  [address]    Report Status 06/16/2018 FINAL  Final  Blood Culture (routine x 2)     Status: None   Collection Time: 06/11/18 12:36 PM  Result Value Ref Range Status   Specimen Description BLOOD RIGHT HAND  Final   Special Requests   Final    BOTTLES DRAWN AEROBIC AND ANAEROBIC Blood Culture adequate volume   Culture   Final    NO GROWTH  5 DAYS Performed at [facility], [address], [place], [place] [address]    Report Status 06/16/2018 FINAL  Final  MRSA PCR Screening     Status: None   Collection Time: 06/11/18  6:34 PM  Result Value Ref Range Status   MRSA by PCR NEGATIVE NEGATIVE Final    Comment:        The GeneXpert MRSA Assay (FDA approved for NASAL specimens only), is one component of a comprehensive MRSA colonization surveillance program. It is not intended to diagnose MRSA infection nor to guide or monitor treatment for MRSA infections. Performed at [facility], [place] [address], [place], [place] [address]   Culture, blood (routine x 2)     Status: None (Preliminary result)   Collection Time: 06/11/18  7:40 PM  Result Value Ref Range Status   Specimen Description BLOOD RIGHT HAND  Final   Special Requests   Final    BOTTLES DRAWN AEROBIC ONLY Blood Culture adequate volume   Culture   Final    NO GROWTH 4 DAYS Performed at [facility], [place] [address], [place], [place] [address]    Report Status PENDING  Incomplete  Culture, blood (routine x 2)     Status: None (Preliminary result)   Collection Time: 06/11/18  7:45 PM  Result Value Ref Range Status   Specimen Description BLOOD RIGHT ANTECUBITAL  Final   Special Requests   Final    BOTTLES DRAWN AEROBIC ONLY Blood Culture adequate volume   Culture   Final    NO GROWTH 4 DAYS Performed at [facility], [place] [address] [place], [place] [address]    Report Status PENDING  Incomplete  Culture, respiratory (tracheal aspirate)     Status: None   Collection Time: 06/11/18 10:57 PM  Result Value Ref Range Status   Specimen Description TRACHEAL ASPIRATE  Final   Special Requests NONE  Final   Gram Stain   Final    WBC PRESENT, PREDOMINANTLY PMN NO ORGANISMS SEEN FEW GRAM POSITIVE RODS FEW YEAST    Culture   Final    Consistent with normal respiratory flora. Performed at [facility], [place] [address] [place], [place] [address]    Report Status  06/14/2018 FINAL  Final  C Difficile Quick Screen w PCR reflex     Status: None   Collection Time: 06/13/18  4:19 PM  Result Value Ref Range Status   C Diff antigen NEGATIVE NEGATIVE Final   C Diff toxin NEGATIVE NEGATIVE Final   C Diff interpretation No C. difficile detected.  Final    Comment: Performed at [facility], [place] [address], [place], [place] [address]  OVA + PARASITE EXAM     Status: None   Collection Time: 06/13/18  7:12 PM  Result Value Ref Range Status   OVA + PARASITE EXAM Final report  Final    Comment: (NOTE) These results were obtained using wet preparation(s) and trichrome stained smear. This test does not include testing for Cryptosporidium parvum, Cyclospora, or Microsporidia. Performed At: [place], [place] [id number] Elwanda Brooklyn R MD KW:[id number]    Source of Sample STOOL  Final  Calprotectin, Fecal     Status: None   Collection Time: 06/13/18  7:12 PM  Result Value Ref Range Status   Calprotectin, Fecal 39 0 - 120 ug/g Final    Comment: (NOTE) Concentration     Interpretation  Follow-Up <16 - 50 ug/g     Normal           None >50 -120 ug/g     Borderline       Re-evaluate in 4-6 weeks    >120 ug/g     Abnormal         Repeat as clinically                                   indicated Performed At: [facility] [place], [place] [id number] Rush Farmer MD LO:[id number]      Studies: Dg Abd Portable 2v  Result Date: 06/15/2018 CLINICAL DATA:  Abdominal pain and distention.  Colitis. EXAM: PORTABLE ABDOMEN - 2 VIEW COMPARISON:  06/11/2018 CT abdomen/pelvis FINDINGS: Moderate gaseous distention of the stomach with gastric fluid level. No disproportionately dilated small bowel loops. Mild-to-moderate gas in colon, most prominent in the right colon. No evidence of pneumatosis or pneumoperitoneum. Cholecystectomy clips are seen in the right upper quadrant of the abdomen. No radiopaque  nephrolithiasis. Foley catheter terminates over the location of the bladder. IMPRESSION: Moderately distended stomach with gastric air-fluid level. Mild-to-moderate colonic gas. No evidence of small-bowel obstruction. No free air or pneumatosis. Electronically Signed   By: Ilona Sorrel M.D.   On: 06/15/2018 21:14    Scheduled Meds: . chlorhexidine  15 mL Mouth Rinse BID  . cholecalciferol  1,000 Units Oral Daily  . diphenoxylate-atropine  1 tablet Oral QID  . feeding supplement (ENSURE ENLIVE)  237 mL Oral TID BM  . folic acid  1 mg Per Tube Daily  . heparin  5,000 Units Subcutaneous Q8H  . insulin aspart  0-15 Units Subcutaneous Q4H  . levothyroxine  50 mcg Intravenous Daily  . magnesium oxide  400 mg Oral Daily  . mouth rinse  15 mL Mouth Rinse q12n4p  . multivitamin with minerals  1 tablet Oral Daily  . pantoprazole (PROTONIX) IV  40 mg Intravenous BID  . thiamine injection  100 mg Intravenous Daily  . vitamin B-12  250 mcg Oral Daily    Continuous Infusions: . sodium chloride 250 mL (06/15/18 2307)  . dextrose 5% lactated ringers 100 mL/hr at 06/15/18 2306  . famotidine (PEPCID) IV 20 mg (06/15/18 2308)     Time spent: 68mns I have personally reviewed and interpreted on  06/16/2018 daily labs, tele strips, imagings as discussed above under date review session and assessment and plans.  I reviewed all nursing notes, pharmacy notes, consultant notes,  vitals, pertinent old records  I have discussed plan of care as described above with RN , patient on 06/16/2018   FFlorencia ReasonsMD, PhD  [facility] Pager [phone] If 7PM-7AM, please contact night-coverage at [email or web address], password [email or web address]/28/2019, 9:05 AM  LOS: 5 days

## 2018-06-17 ENCOUNTER — Encounter (HOSPITAL_COMMUNITY): Payer: Self-pay | Admitting: Internal Medicine

## 2018-06-17 DIAGNOSIS — E876 Hypokalemia: Secondary | ICD-10-CM

## 2018-06-17 DIAGNOSIS — A419 Sepsis, unspecified organism: Principal | ICD-10-CM

## 2018-06-17 DIAGNOSIS — R6520 Severe sepsis without septic shock: Secondary | ICD-10-CM

## 2018-06-17 LAB — BASIC METABOLIC PANEL
ANION GAP: 9 (ref 5–15)
BUN: <5 mg/dL — ABNORMAL LOW (ref 6–20)
CALCIUM: 7.9 mg/dL — AB (ref 8.9–10.3)
CO2: 21 mmol/L — AB (ref 22–32)
CREATININE: 0.93 mg/dL (ref 0.44–1.00)
Chloride: 107 mmol/L (ref 98–111)
Glucose, Bld: 145 mg/dL — ABNORMAL HIGH (ref 70–99)
Potassium: 3.2 mmol/L — ABNORMAL LOW (ref 3.5–5.1)
SODIUM: 137 mmol/L (ref 135–145)

## 2018-06-17 LAB — GLUCOSE, CAPILLARY
GLUCOSE-CAPILLARY: 129 mg/dL — AB (ref 70–99)
GLUCOSE-CAPILLARY: 146 mg/dL — AB (ref 70–99)
GLUCOSE-CAPILLARY: 147 mg/dL — AB (ref 70–99)
GLUCOSE-CAPILLARY: 163 mg/dL — AB (ref 70–99)
Glucose-Capillary: 132 mg/dL — ABNORMAL HIGH (ref 70–99)
Glucose-Capillary: 140 mg/dL — ABNORMAL HIGH (ref 70–99)
Glucose-Capillary: 144 mg/dL — ABNORMAL HIGH (ref 70–99)

## 2018-06-17 LAB — AMMONIA: Ammonia: 43 umol/L — ABNORMAL HIGH (ref 9–35)

## 2018-06-17 LAB — TSH: TSH: 75.673 u[IU]/mL — AB (ref 0.350–4.500)

## 2018-06-17 LAB — CBC
HCT: 28.4 % — ABNORMAL LOW (ref 36.0–46.0)
Hemoglobin: 9.7 g/dL — ABNORMAL LOW (ref 12.0–15.0)
MCH: 35.9 pg — ABNORMAL HIGH (ref 26.0–34.0)
MCHC: 34.2 g/dL (ref 30.0–36.0)
MCV: 105.2 fL — ABNORMAL HIGH (ref 78.0–100.0)
Platelets: 78 10*3/uL — ABNORMAL LOW (ref 150–400)
RBC: 2.7 MIL/uL — ABNORMAL LOW (ref 3.87–5.11)
RDW: 15.6 % — AB (ref 11.5–15.5)
WBC: 10.7 10*3/uL — AB (ref 4.0–10.5)

## 2018-06-17 LAB — MAGNESIUM: MAGNESIUM: 1.9 mg/dL (ref 1.7–2.4)

## 2018-06-17 MED ORDER — VITAMIN B-1 100 MG PO TABS
100.0000 mg | ORAL_TABLET | Freq: Every day | ORAL | Status: DC
  Administered 2018-06-17 – 2018-06-23 (×7): 100 mg via ORAL
  Filled 2018-06-17 (×7): qty 1

## 2018-06-17 MED ORDER — TAMSULOSIN HCL 0.4 MG PO CAPS
0.4000 mg | ORAL_CAPSULE | Freq: Every day | ORAL | Status: DC
  Administered 2018-06-17 – 2018-06-23 (×7): 0.4 mg via ORAL
  Filled 2018-06-17 (×7): qty 1

## 2018-06-17 MED ORDER — FAMOTIDINE 20 MG PO TABS
20.0000 mg | ORAL_TABLET | Freq: Two times a day (BID) | ORAL | Status: DC
  Administered 2018-06-17 – 2018-06-23 (×14): 20 mg via ORAL
  Filled 2018-06-17 (×14): qty 1

## 2018-06-17 MED ORDER — LORAZEPAM 2 MG/ML IJ SOLN
0.5000 mg | Freq: Once | INTRAMUSCULAR | Status: AC
  Administered 2018-06-17: 0.5 mg via INTRAVENOUS
  Filled 2018-06-17: qty 1

## 2018-06-17 MED ORDER — POTASSIUM CHLORIDE CRYS ER 20 MEQ PO TBCR
40.0000 meq | EXTENDED_RELEASE_TABLET | Freq: Once | ORAL | Status: AC
  Administered 2018-06-17: 40 meq via ORAL
  Filled 2018-06-17: qty 2

## 2018-06-17 MED ORDER — PANTOPRAZOLE SODIUM 40 MG PO TBEC
40.0000 mg | DELAYED_RELEASE_TABLET | Freq: Two times a day (BID) | ORAL | Status: DC
  Administered 2018-06-17 – 2018-06-19 (×6): 40 mg via ORAL
  Filled 2018-06-17 (×6): qty 1

## 2018-06-17 MED ORDER — LEVOTHYROXINE SODIUM 100 MCG PO TABS
100.0000 ug | ORAL_TABLET | Freq: Every day | ORAL | Status: DC
  Administered 2018-06-17 – 2018-06-23 (×7): 100 ug via ORAL
  Filled 2018-06-17 (×8): qty 1

## 2018-06-17 NOTE — Progress Notes (Signed)
PROGRESS NOTE  Deborah Russo DSK:[id number] DOB: 1961/07/20 DOA: 06/11/2018 PCP: Eustaquio Maize, MD  Brief summary:  Deborah Russo is an 57 y.o. female.  She was admitted June 23 for altered mental status thought to be due to polysubstance abuse.  Had a similar presentation one year ago.  Was also found to be hypotensive.Was intubated for airway protection and received fluid resuscitation.  Reports long history of abdominal pain treated with narcotics, persistent nausea vomiting and profuse diarrhea. Reportedly had not been eating or drinking normally and taking none of her medication   She  is extubated on June 24 and off pressors on 6/26 ,she is  transferred to hospitalist service June 28 She underwent sigmoidoscope on June 28, continue have ab pain and loose stool ,gi following      HPI/Recap of past 24 hours:   Received ativan 24m iv at 10pm She is awake and interactive this am, she is not oriented to time, she is oriented to place and person Continue to report ab pain, mostly epigastric ,  she has a rectal tube in with loose stool  She report feeling nauseous after drinking coffee this am, no vomiting No fever  Assessment/Plan: Active Problems:   Hypotension   Colitis   Cirrhosis of liver without ascites ([place]  Pancolitis:  -patient reports chronic Abdominal pain/ chronic diarrhea, she reports she is on pain meds for chronic abdominal pain -she presented with severe electrolytes abnormalities,  -gi pcr panel negative -gi consulted, she is to have sigmoidoscope on 6/28, will follow gi recommendation  Electrolytes abnormalities: Has been replaced, repeat in am  Encephalopathy: -Ct head no acute findings, 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 -supportive care , appear improving -start PT, she reports she uses a walker for many years at baseline  Hypothyroidism:  tsh on presentation was 80  Low b12, started on supplement  She has a foley placed  on 6/22 due to urinary retention, foley removed on 6/28, repeat  bladder scan on 6/29, again urinary retention of 643cc, start flomax, start Physical therapy, reinsert foley  Body mass index is 35.12 kg/m.   FTT: pt eval  Code Status: full  Family Communication: patient   Disposition Plan: not ready to discharge   Consultants:  GI  Procedures:  Intubation/extubation  Sigmoidoscope on 6/28  Antibiotics:  Zosyn from 6/23 to 6/27   Objective: BP 134/69 (BP Location: Right Arm)   Pulse 80   Temp 98 F (36.7 C)   Resp 16   Ht 5' 2"  (1.575 m)   Wt 87.1 kg (192 lb 0.3 oz)   SpO2 99%   BMI 35.12 kg/m   Intake/Output Summary (Last 24 hours) at 06/17/2018 0754 Last data filed at 06/17/2018 0504 Gross per 24 hour  Intake 685.74 ml  Output 350 ml  Net 335.74 ml   Filed Weights   06/13/18 0341 06/14/18 0350 06/15/18 0521  Weight: 84.4 kg (186 lb 1.1 oz) 84.7 kg (186 lb 11.7 oz) 87.1 kg (192 lb 0.3 oz)    Exam: Patient is examined daily including today on 06/17/2018, exams remain the same as of yesterday except that has changed    General:  More awake and interactive today, she is oriented to place and person,  she is not oriented to time  Cardiovascular: RRR  Respiratory: CTABL  Abdomen: epigastric tenderness, Soft/ND, positive BS  Musculoskeletal: No Edema  Neuro: alert, oriented to place and person, not to time  Data Reviewed: Basic  Metabolic Panel: Recent Labs  Lab 06/12/18 0452 06/12/18 1330 06/13/18 0349 06/13/18 1704 06/14/18 0415 06/14/18 0417 06/14/18 1813 06/15/18 0741 06/15/18 1940  NA 133* 132* 133* 135 135  --  137 139 136  K <2.0* 3.0* 2.5* 2.8* 3.5  --  3.7 4.4 4.9  CL 101 98* 100 102 103  --  108 109 108  CO2 22 21* 24 25 25   --  23 20* 20*  GLUCOSE 262* 265* 148* 99 197*  --  129* 129* 134*  BUN 6 <5* <5* <5* <5*  --  <5* <5* <5*  CREATININE 0.96 0.96 0.99 0.95 1.05*  --  1.00 0.93 0.98  CALCIUM 7.3* 7.6* 7.8* 7.6* 7.5* 7.4*   7.2* 7.8* 7.9* 7.9*  MG 1.8 1.9 2.0  --   --  1.8  --  2.2  --   PHOS 2.3*  --   --   --  1.3*  --  1.1* 1.7* 2.6   Liver Function Tests: Recent Labs  Lab 06/11/18 1238 06/11/18 2114 06/14/18 0415 06/14/18 1813 06/15/18 0741 06/15/18 1940  AST 19 23  --   --   --   --   ALT 11* 13*  --   --   --   --   ALKPHOS 66 87  --   --   --   --   BILITOT 0.7 0.8  --   --   --   --   PROT 4.0* 5.3*  --   --   --   --   ALBUMIN 2.0* 2.6* 2.3* 2.4* 2.6* 2.6*   Recent Labs  Lab 06/11/18 2114  LIPASE 30   No results for input(s): AMMONIA in the last 168 hours. CBC: Recent Labs  Lab 06/11/18 1141 06/11/18 2114 06/12/18 0452  WBC 6.3 15.2* 17.8*  NEUTROABS 4.6 11.7*  --   HGB 10.8* 9.9* 10.4*  HCT 29.9* 27.1* 29.4*  MCV 102.0* 101.1* 100.7*  PLT 78* 90* 104*   Cardiac Enzymes:   Recent Labs  Lab 06/11/18 1238 06/11/18 2114 06/12/18 0102 06/12/18 0452 06/12/18 1031  CKTOTAL  --   --   --   --  274*  TROPONINI 0.08* 1.17* 1.20* 1.20*  --    BNP (last 3 results) Recent Labs    06/11/18 2114  BNP 226.2*    ProBNP (last 3 results) No results for input(s): PROBNP in the last 8760 hours.  CBG: Recent Labs  Lab 06/16/18 1219 06/16/18 1633 06/16/18 2150 06/17/18 0044 06/17/18 0503  GLUCAP 148* 120* 103* 132* 144*    Recent Results (from the past 240 hour(s))  Urine culture     Status: None   Collection Time: 06/11/18 11:37 AM  Result Value Ref Range Status   Specimen Description   Final    URINE, CLEAN CATCH Performed at [facility], [address], [place], [place] [address]    Special Requests   Final    NONE Performed at [facility], [address] [place], [place] [address]    Culture   Final    NO GROWTH Performed at [facility], [place] [address], [place], [place] [address]    Report Status 06/13/2018 FINAL  Final  Blood Culture (routine x 2)     Status: None   Collection Time: 06/11/18 11:41 AM  Result Value Ref Range Status   Specimen  Description BLOOD LEFT ARM  Final   Special Requests   Final  BOTTLES DRAWN AEROBIC ONLY Blood Culture adequate volume   Culture   Final    NO GROWTH 5 DAYS Performed at [facility], [address], [place], [place] [address]    Report Status 06/16/2018 FINAL  Final  Blood Culture (routine x 2)     Status: None   Collection Time: 06/11/18 12:36 PM  Result Value Ref Range Status   Specimen Description BLOOD RIGHT HAND  Final   Special Requests   Final    BOTTLES DRAWN AEROBIC AND ANAEROBIC Blood Culture adequate volume   Culture   Final    NO GROWTH 5 DAYS Performed at [facility], [address] [place], [place] [address]    Report Status 06/16/2018 FINAL  Final  MRSA PCR Screening     Status: None   Collection Time: 06/11/18  6:34 PM  Result Value Ref Range Status   MRSA by PCR NEGATIVE NEGATIVE Final    Comment:        The GeneXpert MRSA Assay (FDA approved for NASAL specimens only), is one component of a comprehensive MRSA colonization surveillance program. It is not intended to diagnose MRSA infection nor to guide or monitor treatment for MRSA infections. Performed at [facility], [place] [address], [place], [place] [address]   Culture, blood (routine x 2)     Status: None   Collection Time: 06/11/18  7:40 PM  Result Value Ref Range Status   Specimen Description BLOOD RIGHT HAND  Final   Special Requests   Final    BOTTLES DRAWN AEROBIC ONLY Blood Culture adequate volume   Culture   Final    NO GROWTH 5 DAYS Performed at [facility], [place] [address], [place], [place] [address]    Report Status 06/16/2018 FINAL  Final  Culture, blood (routine x 2)     Status: None   Collection Time: 06/11/18  7:45 PM  Result Value Ref Range Status   Specimen Description BLOOD RIGHT ANTECUBITAL  Final   Special Requests   Final    BOTTLES DRAWN AEROBIC ONLY Blood Culture adequate volume   Culture   Final    NO GROWTH 5 DAYS Performed at [facility],  [place] [address], [place], [place] [address]    Report Status 06/16/2018 FINAL  Final  Culture, respiratory (tracheal aspirate)     Status: None   Collection Time: 06/11/18 10:57 PM  Result Value Ref Range Status   Specimen Description TRACHEAL ASPIRATE  Final   Special Requests NONE  Final   Gram Stain   Final    WBC PRESENT, PREDOMINANTLY PMN NO ORGANISMS SEEN FEW GRAM POSITIVE RODS FEW YEAST    Culture   Final    Consistent with normal respiratory flora. Performed at [facility], [place] [address], [place], [place] [address]    Report Status 06/14/2018 FINAL  Final  C Difficile Quick Screen w PCR reflex     Status: None   Collection Time: 06/13/18  4:19 PM  Result Value Ref Range Status   C Diff antigen NEGATIVE NEGATIVE Final   C Diff toxin NEGATIVE NEGATIVE Final   C Diff interpretation No C. difficile detected.  Final    Comment: Performed at [facility], [place] [address] [place],  [address]  OVA + PARASITE EXAM     Status: None   Collection Time: 06/13/18  7:12 PM  Result Value Ref Range Status   OVA + PARASITE EXAM  Final report  Final    Comment: (NOTE) These results were obtained using wet preparation(s) and trichrome stained smear. This test does not include testing for Cryptosporidium parvum, Cyclospora, or Microsporidia. Performed At: [place], [place] [id number] Elwanda Brooklyn R MD HA:[id number]    Source of Sample STOOL  Final  Calprotectin, Fecal     Status: None   Collection Time: 06/13/18  7:12 PM  Result Value Ref Range Status   Calprotectin, Fecal 39 0 - 120 ug/g Final    Comment: (NOTE) Concentration     Interpretation   Follow-Up <16 - 50 ug/g     Normal           None >50 -120 ug/g     Borderline       Re-evaluate in 4-6 weeks    >120 ug/g     Abnormal         Repeat as clinically                                   indicated Performed At: [facility] [place], [place]  [id number] Rush Farmer MD AY:[id number]   Gastrointestinal Panel by PCR , Stool     Status: None   Collection Time: 06/15/18 12:43 PM  Result Value Ref Range Status   Campylobacter species NOT DETECTED NOT DETECTED Final   Plesimonas shigelloides NOT DETECTED NOT DETECTED Final   Salmonella species NOT DETECTED NOT DETECTED Final   Yersinia enterocolitica NOT DETECTED NOT DETECTED Final   Vibrio species NOT DETECTED NOT DETECTED Final   Vibrio cholerae NOT DETECTED NOT DETECTED Final   Enteroaggregative E coli (EAEC) NOT DETECTED NOT DETECTED Final   Enteropathogenic E coli (EPEC) NOT DETECTED NOT DETECTED Final   Enterotoxigenic E coli (ETEC) NOT DETECTED NOT DETECTED Final   Shiga like toxin producing E coli (STEC) NOT DETECTED NOT DETECTED Final   Shigella/Enteroinvasive E coli (EIEC) NOT DETECTED NOT DETECTED Final   Cryptosporidium NOT DETECTED NOT DETECTED Final   Cyclospora cayetanensis NOT DETECTED NOT DETECTED Final   Entamoeba histolytica NOT DETECTED NOT DETECTED Final   Giardia lamblia NOT DETECTED NOT DETECTED Final   Adenovirus F40/41 NOT DETECTED NOT DETECTED Final   Astrovirus NOT DETECTED NOT DETECTED Final   Norovirus GI/GII NOT DETECTED NOT DETECTED Final   Rotavirus A NOT DETECTED NOT DETECTED Final   Sapovirus (I, II, IV, and V) NOT DETECTED NOT DETECTED Final    Comment: Performed at [facility], [address], [place], [place] [address]     Studies: No results found.  Scheduled Meds: . chlorhexidine  15 mL Mouth Rinse BID  . cholecalciferol  1,000 Units Oral Daily  . diphenoxylate-atropine  1 tablet Oral QID  . feeding supplement (ENSURE ENLIVE)  237 mL Oral TID BM  . folic acid  1 mg Per Tube Daily  . heparin  5,000 Units Subcutaneous Q8H  . insulin aspart  0-15 Units Subcutaneous Q4H  . levothyroxine  50 mcg Intravenous Daily  . mouth rinse  15 mL Mouth Rinse q12n4p  . multivitamin with minerals  1 tablet Oral Daily  . pantoprazole  (PROTONIX) IV  40 mg Intravenous BID  . thiamine injection  100 mg Intravenous Daily  . vitamin B-12  250 mcg Oral Daily    Continuous Infusions: . sodium chloride 250 mL (06/15/18 2307)  . dextrose 5%  lactated ringers 100 mL/hr at 06/16/18 2226  . famotidine (PEPCID) IV 20 mg (06/16/18 2227)     Time spent: 7mns I have personally reviewed and interpreted on  06/17/2018 daily labs, tele strips, imagings as discussed above under date review session and assessment and plans.  I reviewed all nursing notes, pharmacy notes, consultant notes,  vitals, pertinent old records  I have discussed plan of care as described above with RN , patient on 06/17/2018   FFlorencia ReasonsMD, PhD  [facility] Pager [phone] If 7PM-7AM, please contact night-coverage at [email or web address], password [email or web address]/29/2019, 7:54 AM  LOS: 6 days

## 2018-06-18 DIAGNOSIS — R627 Adult failure to thrive: Secondary | ICD-10-CM

## 2018-06-18 DIAGNOSIS — R4182 Altered mental status, unspecified: Secondary | ICD-10-CM

## 2018-06-18 DIAGNOSIS — D696 Thrombocytopenia, unspecified: Secondary | ICD-10-CM

## 2018-06-18 LAB — MAGNESIUM: Magnesium: 1.8 mg/dL (ref 1.7–2.4)

## 2018-06-18 LAB — COMPREHENSIVE METABOLIC PANEL
ALBUMIN: 2.1 g/dL — AB (ref 3.5–5.0)
ALT: 11 U/L (ref 0–44)
AST: 15 U/L (ref 15–41)
Alkaline Phosphatase: 77 U/L (ref 38–126)
Anion gap: 4 — ABNORMAL LOW (ref 5–15)
BILIRUBIN TOTAL: 0.5 mg/dL (ref 0.3–1.2)
CHLORIDE: 107 mmol/L (ref 98–111)
CO2: 23 mmol/L (ref 22–32)
Calcium: 7.7 mg/dL — ABNORMAL LOW (ref 8.9–10.3)
Creatinine, Ser: 1.01 mg/dL — ABNORMAL HIGH (ref 0.44–1.00)
GFR calc Af Amer: >60 mL/min (ref >60)
GFR calc non Af Amer: >60 mL/min (ref >60)
GLUCOSE: 119 mg/dL — AB (ref 70–99)
Potassium: 3.2 mmol/L — ABNORMAL LOW (ref 3.5–5.1)
SODIUM: 134 mmol/L — AB (ref 135–145)
TOTAL PROTEIN: 4.7 g/dL — AB (ref 6.5–8.1)

## 2018-06-18 LAB — GLUCOSE, CAPILLARY
GLUCOSE-CAPILLARY: 118 mg/dL — AB (ref 70–99)
GLUCOSE-CAPILLARY: 97 mg/dL (ref 70–99)
Glucose-Capillary: 123 mg/dL — ABNORMAL HIGH (ref 70–99)
Glucose-Capillary: 149 mg/dL — ABNORMAL HIGH (ref 70–99)
Glucose-Capillary: 128 mg/dL — ABNORMAL HIGH (ref 70–99)

## 2018-06-18 LAB — 5 HIAA, QUANTITATIVE, URINE, 24 HOUR
5-HIAA, Ur: 2.9 mg/L
5-HIAA,Quant.,24 Hr Urine: 2.3 mg/(24.h) (ref 0.0–14.9)
Total Volume: 800

## 2018-06-18 MED ORDER — ENOXAPARIN SODIUM 40 MG/0.4ML ~~LOC~~ SOLN
40.0000 mg | SUBCUTANEOUS | Status: DC
  Administered 2018-06-18 – 2018-06-22 (×5): 40 mg via SUBCUTANEOUS
  Filled 2018-06-18 (×5): qty 0.4

## 2018-06-18 MED ORDER — OXYCODONE-ACETAMINOPHEN 5-325 MG PO TABS
1.0000 | ORAL_TABLET | Freq: Three times a day (TID) | ORAL | Status: DC | PRN
  Administered 2018-06-18 – 2018-06-22 (×9): 1 via ORAL
  Filled 2018-06-18 (×9): qty 1

## 2018-06-18 MED ORDER — POTASSIUM CHLORIDE CRYS ER 20 MEQ PO TBCR
40.0000 meq | EXTENDED_RELEASE_TABLET | Freq: Once | ORAL | Status: AC
  Administered 2018-06-18: 40 meq via ORAL
  Filled 2018-06-18: qty 2

## 2018-06-18 MED ORDER — COSYNTROPIN 0.25 MG IJ SOLR
0.2500 mg | Freq: Once | INTRAMUSCULAR | Status: AC
  Administered 2018-06-19: 0.25 mg via INTRAVENOUS
  Filled 2018-06-18: qty 0.25

## 2018-06-18 MED ORDER — MAGNESIUM SULFATE 2 GM/50ML IV SOLN
2.0000 g | Freq: Once | INTRAVENOUS | Status: AC
  Administered 2018-06-18: 2 g via INTRAVENOUS
  Filled 2018-06-18: qty 50
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
## 2018-06-18 NOTE — Progress Notes (Addendum)
    Progress Note   Assessment / Plan:   Assessment: 1. Diarrhea:infectious studies neg, flex sig 06/17/17 with congested vascular pattern-?micro colitis-path pend, per nursng "maybe slightly more form/slighlty decreased", rectal tube in place, albumin low at 2.1 today 2. Abnormal CT abdomen: with pancreatitis and cirrhosis 3. Mild microcytic anemia with thrombocytopenia: hgb 9.7, plt 78 4. AMS  Plan: 1. Pathology pending from flex sig 2. Continue Lomotil scheduled q6 3. Continue other supportive measures 4. Will leave pain management to hospitalist 5.  Please await any further recs from Dr. Carlean Purl later today  Thank you for your kind consultation, we will continue to follow along.   LOS: 7 days   Levin Erp  06/18/2018, 11:20 AM  Pager # [phone]    [facility] Attending   I have taken an interval history, reviewed the chart and examined the patient. I agree with the Advanced Practitioner's note, impression and recommendations.    [name], MD, [facility]  Gastroenterology 06/18/2018 3:22 PM  [phone]    Subjective  Chief Complaint: Diarrhea, Abnormal Ct abdomen  Flex sig 06/17/18- perianal rash, rectal tenderness, congested vascular pattern-decreased mucosa rectum, sigmoid and descending; infectious testing negative ? micro colitis  This morning, complaining of pain "in my abdomen". Tells me she needs pain medications and "I ain't kidding".  Denies any other complaints.  Per nursing: Dr Erlinda Hong paged re pain meds-does have some "pretty bad" bed sores which she was complaining about.    Objective   Vital signs in last 24 hours: Temp:  [98.6 F (37 C)-99.6 F (37.6 C)] 99 F (37.2 C) (06/30 0807) Pulse Rate:  [68-77] 77 (06/30 0807) Resp:  [16-20] 16 (06/30 0807) BP: (94-138)/(59-71) 94/59 (06/30 0807) SpO2:  [95 %-98 %] 95 % (06/30 0807) Last BM Date: 06/17/18 General:    Ill-appearing white female in NAD Heart:  Regular rate and rhythm;  no murmurs Lungs: Respirations even and unlabored, lungs CTA bilaterally Abdomen:  Soft, mild generalized ttp and nondistended. Normal bowel sounds. Extremities:  Without edema. Neurologic:  Alert and oriented,  grossly normal neurologically. Psych:  Normal mood and affect.  Intake/Output from previous day: 06/29 0701 - 06/30 0700 In: 3447.9 [P.O.:480; I.V.:2967.9] Out: 400 [Urine:400] Intake/Output this shift: Total I/O In: 240 [P.O.:240] Out: 800 [Urine:300; Stool:500]  Lab Results: Recent Labs    06/17/18 0810  WBC 10.7*  HGB 9.7*  HCT 28.4*  PLT 78*   BMET Recent Labs    06/15/18 1940 06/17/18 0810 06/18/18 0649  NA 136 137 134*  K 4.9 3.2* 3.2*  CL 108 107 107  CO2 20* 21* 23  GLUCOSE 134* 145* 119*  BUN <5* <5* <5*  CREATININE 0.98 0.93 1.01*  CALCIUM 7.9* 7.9* 7.7*   LFT Recent Labs    06/18/18 0649  PROT 4.7*  ALBUMIN 2.1*  AST 15  ALT 11  ALKPHOS 77  BILITOT 0.5

## 2018-06-18 NOTE — Evaluation (Signed)
Physical Therapy Evaluation Patient Details Name: Deborah Russo MRN: [id number] DOB: 09-22-1961 Today's Date: 06/18/2018   History of Present Illness  Deborah N Conawayis an 57 y.o.female. She was admitted June 23 for altered mental status thought to be due to polysubstance abuse. Had a similar presentation one year ago. Was also found to be hypotensive.Was intubated for airway protection and received fluid resuscitation, extubated June 24;Reports long history of abdominal pain treated with narcotics, persistent nausea vomiting and profuse diarrhea.  has a past medical history of Chronic abdominal pain, Chronic migraine, Chronic pain, Hypothyroidism, Nausea, vomiting, and diarrhea, Pain management, Sleep apnea, and Stroke ([place]).  Clinical Impression   Pt admitted with above diagnosis. Pt currently with functional limitations due to the deficits listed below (see PT Problem List). Independent with quad cane/Rw, household distance and function,  prior to admission; presents with significant weakness, poor endurance, and significant fall risk; highly recommend post-acute rehab to maximize independence and safety with mobility prior to dc home; Pt is in agreement; she lives in [place];   Pt will benefit from skilled PT to increase their independence and safety with mobility to allow discharge to the venue listed below.       Follow Up Recommendations SNF    Equipment Recommendations  Rolling walker with 5" wheels;3in1 (PT)    Recommendations for Other Services       Precautions / Restrictions Precautions Precautions: Fall Precaution Comments: Rectal tube      Mobility  Bed Mobility Overal bed mobility: Needs Assistance Bed Mobility: Supine to Sit     Supine to sit: Mod assist     General bed mobility comments: Mod handheld assist to elevate trunk to sit  Transfers Overall transfer level: Needs assistance Equipment used: Rolling walker (2 wheeled) Transfers: Sit  to/from Stand Sit to Stand: Mod assist         General transfer comment: Light mod assist to power up; cues for hand placement  Ambulation/Gait Ambulation/Gait assistance: Min assist Gait Distance (Feet): 5 Feet Assistive device: Rolling walker (2 wheeled) Gait Pattern/deviations: Decreased step length - right;Decreased step length - left;Trunk flexed     General Gait Details: Very weak; cues to self-monitor for activity tolerance; required lots of cues for proper positioning in front of chair before sitting  Stairs            Wheelchair Mobility    Modified Rankin (Stroke Patients Only)       Balance Overall balance assessment: Needs assistance           Standing balance-Leahy Scale: Poor Standing balance comment: Dependent on UE support                             Pertinent Vitals/Pain Pain Assessment: Faces Faces Pain Scale: Hurts even more Pain Location: Discomfort around anal opening Pain Descriptors / Indicators: Grimacing;Guarding;Burning Pain Intervention(s): Monitored during session;Repositioned;Other (comment)(gently dabbed periananl area to clean)    Home Living Family/patient expects to be discharged to:: Private residence Living Arrangements: Children;Parent;Other relatives Available Help at Discharge: Family;Available PRN/intermittently Type of Home: House Home Access: Stairs to enter Entrance Stairs-Rails: Psychiatric nurse of Steps: 3 Home Layout: One level Home Equipment: Walker - 2 wheels;Cane - quad;Shower seat      Prior Function Level of Independence: Independent with assistive device(s)         Comments: Uses quad cane mostly, RW prn; not very active; enjoys sitting on front porch  and talking with neighbors     Hand Dominance        Extremity/Trunk Assessment   Upper Extremity Assessment Upper Extremity Assessment: Generalized weakness    Lower Extremity Assessment Lower Extremity  Assessment: Generalized weakness       Communication   Communication: No difficulties  Cognition Arousal/Alertness: Awake/alert Behavior During Therapy: WFL for tasks assessed/performed Overall Cognitive Status: Within Functional Limits for tasks assessed                                        General Comments      Exercises     Assessment/Plan    PT Assessment Patient needs continued PT services  PT Problem List Decreased strength;Decreased activity tolerance;Decreased balance;Decreased mobility;Decreased knowledge of use of DME;Decreased safety awareness;Decreased knowledge of precautions       PT Treatment Interventions DME instruction;Gait training;Stair training;Functional mobility training;Therapeutic activities;Therapeutic exercise;Balance training;Patient/family education    PT Goals (Current goals can be found in the Care Plan section)  Acute Rehab PT Goals Patient Stated Goal: Wants to get stronger at rehab and then go home PT Goal Formulation: With patient Time For Goal Achievement: 07/02/18 Potential to Achieve Goals: Good    Frequency Min 2X/week   Barriers to discharge        Co-evaluation               AM-PAC PT "6 Clicks" Daily Activity  Outcome Measure Difficulty turning over in bed (including adjusting bedclothes, sheets and blankets)?: A Little Difficulty moving from lying on back to sitting on the side of the bed? : Unable Difficulty sitting down on and standing up from a chair with arms (e.g., wheelchair, bedside commode, etc,.)?: Unable Help needed moving to and from a bed to chair (including a wheelchair)?: A Lot Help needed walking in hospital room?: A Lot Help needed climbing 3-5 steps with a railing? : Total 6 Click Score: 10    End of Session Equipment Utilized During Treatment: Gait belt Activity Tolerance: Patient limited by fatigue Patient left: in chair;with call bell/phone within reach;with chair alarm  set Nurse Communication: Mobility status PT Visit Diagnosis: Muscle weakness (generalized) (M62.81);Unsteadiness on feet (R26.81)    Time: 1610-9604 PT Time Calculation (min) (ACUTE ONLY): 27 min   Charges:   PT Evaluation $PT Eval Moderate Complexity: 1 Mod PT Treatments $Therapeutic Activity: 8-22 mins   PT G Codes:        Roney Marion, PT  Acute Rehabilitation Services Pager [phone] Office [phone]   [place] 06/18/2018, 9:05 AM

## 2018-06-18 NOTE — Progress Notes (Signed)
PROGRESS NOTE  [name] WIO:[id number] DOB: Jul 20, 1961 DOA: 06/11/2018 PCP: Eustaquio Maize, MD  Brief summary:  Deborah Russo is an 57 y.o. female.  She was admitted June 23 for altered mental status thought to be due to polysubstance abuse.  Had a similar presentation one year ago.  "Discharge summary from  1 year ago was reviewed when she presented with altered mental status.  There was diagnosed with acute toxic encephalopathy thought to be due to polypharmacy with unintentional overdose.  However it was also thought that she may have had a stroke at that time she refused further evaluation."    Per ED initial intake "From home- sister moved out a week ago, Called EMS for SOB, was sitting on front porch of filthy home per EMS"    Was also found to be hypotensive.Was intubated for airway protection and received fluid resuscitation.  Reports long history of abdominal pain treated with narcotics, persistent nausea vomiting and profuse diarrhea. Reportedly had not been eating or drinking normally and taking none of her medication   She  is extubated on June 24 and off pressors on 6/26 ,she is  transferred to hospitalist service June 28 She underwent sigmoidoscope on June 28, continue have ab pain and loose stool ,gi following      HPI/Recap of past 24 hours:  She c/o epigastric  Pain for over 43yr, she wants percocet, she continue to c/o feeling nauseous, no appetite,   She is more alert and interactive today, she is able to provide some history , she reports she has lost her appetite for a few weeks prior to this hospitalization  She lives with her sister and her daughter, they do not have transportation, it is hard for her to see her doctors in the clinic  no fever  Assessment/Plan: Active Problems:   Hypotension   Colitis   Cirrhosis of liver without ascites ([place]  Pancolitis with profuse diarrhea, currently have a rectal tube , she c/o epigastric pain  which is chronic by patient's report :  -patient reports chronic Abdominal pain/ chronic diarrhea, she reports she is on pain meds for chronic abdominal pain --gi pcr panel negative -she is on protonix bid and pepecid bid  -gi consulted, she is to have sigmoidoscope on 6/28, will follow gi recommendation  Electrolytes abnormalities: she is found to have severe electrolytes abnormalities on presentation   Hypokalemia: k on presentation less than 2, improving, but remain low, continue to replace to keep k>4 Hypomagnesemia: mag 1.3 on presentation, replaced, improving, keep replace to keep mag >2 Hypocalcemia: calcium on presentation was 5.3, replaced much improved, calcium 7.9, albumin 2.6 Hypophosphatemia: phos on presentation is less than 1, replaced  Encephalopathy: -Ct head no acute findings, 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 ( perc chart review she was hospitalized for left mca stroke in 03/2017) -supportive care , appear improving -start PT, she reports she uses a walker for many years at baseline   Macrocytic anemia mcv 107 on presentation hgb around 9-10 ( hgb a year ago around 13-14)  Hypothyroidism:  tsh on presentation was 80 She is started on syntrhoids ( per chart review she has a long history of noncompliance with meds)  Low b12, started on supplement  Thrombocytopenia: plt on presentation was 747 ( a year ago was 117-146 per chart reveiw) CT ab showed "Hepatic steatosis with evidence of cirrhosis."  With trace ascites, spleen unremarkable  She has a foley placed on 6/22 due  to urinary retention, foley removed on 6/28, repeat  bladder scan on 6/29, again urinary retention of 643cc, start flomax, start Physical therapy, reinsert foley  Aortic atherosclerosis, including high-grade stenosis or occlusion of the left common iliac artery. with distal reconstitution of flow related to collateral Pathways On CT ab  Body mass index is 35.12 kg/m.   Smoker :  smoking cessation education provided at length> 15mn  Chronic pain ( abdominal pain) [facility] "Per outpatient's note from 09/2016  Chronic migraine: Followed by Dr. 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  Has had gall bladder removed Has had exploratory lap to eval for pain source in over 20 yrs ago S/p hysterectomy Last had EGD years ago (2015) No EtOH use Does take ibuprofen when her stomach hurts"   FTT: pt eval recommended SNF placement once medically stable  Code Status: full  Family Communication: patient   Disposition Plan: not ready to discharge   Consultants:  GI  Procedures: Central line placement in the ED  Intubation/extubation  Sigmoidoscope on 6/28  Antibiotics:  Zosyn from 6/23 to 6/27   Objective: BP (!) 94/59 (BP Location: Right Arm)   Pulse 77   Temp 99 F (37.2 C) (Oral)   Resp 16   Ht 5' 2"  (1.575 m)   Wt 87.1 kg (192 lb 0.3 oz)   SpO2 95%   BMI 35.12 kg/m   Intake/Output Summary (Last 24 hours) at 06/18/2018 06295Last data filed at 06/18/2018 0500 Gross per 24 hour  Intake 3447.94 ml  Output 400 ml  Net 3047.94 ml   Filed Weights   06/13/18 0341 06/14/18 0350 06/15/18 0521  Weight: 84.4 kg (186 lb 1.1 oz) 84.7 kg (186 lb 11.7 oz) 87.1 kg (192 lb 0.3 oz)    Exam: Patient is examined daily including today on 06/18/2018, exams remain the same as of yesterday except that has changed    General:  More awake and interactive today, she is oriented to place and person,  she is not oriented to time  Cardiovascular: RRR  Respiratory: CTABL  Abdomen: epigastric tenderness, Soft/ND, positive BS, well healed prior midline surgical scar    Musculoskeletal: No Edema  Neuro: alert, oriented to place and person, not to time  Data Reviewed: Basic Metabolic Panel: Recent Labs  Lab  06/12/18 0452  06/13/18 0349  06/14/18 0415 06/14/18 0417 06/14/18 1813 06/15/18 0741 06/15/18 1940 06/17/18 0810 06/18/18 0649  NA 133*   < > 133*   < > 135  --  137 139 136 137 134*  K <2.0*   < > 2.5*   < > 3.5  --  3.7 4.4 4.9 3.2* 3.2*  CL 101   < > 100   < > 103  --  108 109 108 107 107  CO2 22   < > 24   < > 25  --  23 20* 20* 21* 23  GLUCOSE 262*   < > 148*   < > 197*  --  129* 129* 134* 145* 119*  BUN 6   < > <5*   < > <5*  --  <5* <5* <5* <5* <5*  CREATININE 0.96   < > 0.99   < > 1.05*  --  1.00 0.93 0.98 0.93 1.01*  CALCIUM 7.3*   < > 7.8*   < > 7.5* 7.4*  7.2* 7.8* 7.9* 7.9* 7.9* 7.7*  MG 1.8   < > 2.0  --   --  1.8  --  2.2  --  1.9 1.8  PHOS 2.3*  --   --   --  1.3*  --  1.1* 1.7* 2.6  --   --    < > = values in this interval not displayed.   Liver Function Tests: Recent Labs  Lab 06/11/18 1238 06/11/18 2114 06/14/18 0415 06/14/18 1813 06/15/18 0741 06/15/18 1940 06/18/18 0649  AST 19 23  --   --   --   --  15  ALT 11* 13*  --   --   --   --  11  ALKPHOS 66 87  --   --   --   --  77  BILITOT 0.7 0.8  --   --   --   --  0.5  PROT 4.0* 5.3*  --   --   --   --  4.7*  ALBUMIN 2.0* 2.6* 2.3* 2.4* 2.6* 2.6* 2.1*   Recent Labs  Lab 06/11/18 2114  LIPASE 30   Recent Labs  Lab 06/17/18 0810  AMMONIA 43*   CBC: Recent Labs  Lab 06/11/18 1141 06/11/18 2114 06/12/18 0452 06/17/18 0810  WBC 6.3 15.2* 17.8* 10.7*  NEUTROABS 4.6 11.7*  --   --   HGB 10.8* 9.9* 10.4* 9.7*  HCT 29.9* 27.1* 29.4* 28.4*  MCV 102.0* 101.1* 100.7* 105.2*  PLT 78* 90* 104* 78*   Cardiac Enzymes:   Recent Labs  Lab 06/11/18 1238 06/11/18 2114 06/12/18 0102 06/12/18 0452 06/12/18 1031  CKTOTAL  --   --   --   --  274*  TROPONINI 0.08* 1.17* 1.20* 1.20*  --    BNP (last 3 results) Recent Labs    06/11/18 2114  BNP 226.2*    ProBNP (last 3 results) No results for input(s): PROBNP in the last 8760 hours.  CBG: Recent Labs  Lab 06/17/18 1635 06/17/18 2037  06/17/18 2354 06/18/18 0416 06/18/18 0801  GLUCAP 163* 147* 129* 123* 149*    Recent Results (from the past 240 hour(s))  Urine culture     Status: None   Collection Time: 06/11/18 11:37 AM  Result Value Ref Range Status   Specimen Description   Final    URINE, CLEAN CATCH Performed at [facility], [address], [place], [place] [address]    Special Requests   Final    NONE Performed at [facility], [address], [place], [place] [address]    Culture   Final    NO GROWTH Performed at [facility], [place] [address], [place], [place] [address]    Report Status 06/13/2018 FINAL  Final  Blood Culture (routine x 2)     Status: None   Collection Time: 06/11/18 11:41 AM  Result Value Ref Range Status   Specimen Description BLOOD LEFT ARM  Final   Special Requests   Final    BOTTLES DRAWN AEROBIC ONLY Blood Culture adequate volume   Culture   Final    NO GROWTH 5 DAYS Performed at [facility], [address], [place], [place] [address]    Report Status 06/16/2018 FINAL  Final  Blood Culture (routine x 2)     Status: None   Collection Time: 06/11/18 12:36 PM  Result Value Ref Range Status   Specimen Description BLOOD RIGHT HAND  Final   Special Requests   Final  BOTTLES DRAWN AEROBIC AND ANAEROBIC Blood Culture adequate volume   Culture   Final    NO GROWTH 5 DAYS Performed at [facility], [address] [place], [place] [address]    Report Status 06/16/2018 FINAL  Final  MRSA PCR Screening     Status: None   Collection Time: 06/11/18  6:34 PM  Result Value Ref Range Status   MRSA by PCR NEGATIVE NEGATIVE Final    Comment:        The GeneXpert MRSA Assay (FDA approved for NASAL specimens only), is one component of a comprehensive MRSA colonization surveillance program. It is not intended to diagnose MRSA infection nor to guide or monitor treatment for MRSA infections. Performed at [facility], [place] [address] [place], [place] [address]   Culture,  blood (routine x 2)     Status: None   Collection Time: 06/11/18  7:40 PM  Result Value Ref Range Status   Specimen Description BLOOD RIGHT HAND  Final   Special Requests   Final    BOTTLES DRAWN AEROBIC ONLY Blood Culture adequate volume   Culture   Final    NO GROWTH 5 DAYS Performed at [facility], [place] [address] [place], [place] [address]    Report Status 06/16/2018 FINAL  Final  Culture, blood (routine x 2)     Status: None   Collection Time: 06/11/18  7:45 PM  Result Value Ref Range Status   Specimen Description BLOOD RIGHT ANTECUBITAL  Final   Special Requests   Final    BOTTLES DRAWN AEROBIC ONLY Blood Culture adequate volume   Culture   Final    NO GROWTH 5 DAYS Performed at [facility], [place] [address], [place], [place] [address]    Report Status 06/16/2018 FINAL  Final  Culture, respiratory (tracheal aspirate)     Status: None   Collection Time: 06/11/18 10:57 PM  Result Value Ref Range Status   Specimen Description TRACHEAL ASPIRATE  Final   Special Requests NONE  Final   Gram Stain   Final    WBC PRESENT, PREDOMINANTLY PMN NO ORGANISMS SEEN FEW GRAM POSITIVE RODS FEW YEAST    Culture   Final    Consistent with normal respiratory flora. Performed at [facility], [place] [address], [place], [place] [address]    Report Status 06/14/2018 FINAL  Final  C Difficile Quick Screen w PCR reflex     Status: None   Collection Time: 06/13/18  4:19 PM  Result Value Ref Range Status   C Diff antigen NEGATIVE NEGATIVE Final   C Diff toxin NEGATIVE NEGATIVE Final   C Diff interpretation No C. difficile detected.  Final    Comment: Performed at [facility], [place] [address] [place], [place] [address]  OVA + PARASITE EXAM     Status: None   Collection Time: 06/13/18  7:12 PM  Result Value Ref Range Status   OVA + PARASITE EXAM Final report  Final    Comment: (NOTE) These results were obtained using wet preparation(s) and trichrome stained smear.  This test does not include testing for Cryptosporidium parvum, Cyclospora, or Microsporidia. Performed At: [place], [place] [id number] Elwanda Brooklyn R MD [id number]    Source of Sample STOOL  Final  Calprotectin, Fecal     Status: None   Collection Time: 06/13/18  7:12 PM  Result Value Ref Range Status   Calprotectin,  Fecal 39 0 - 120 ug/g Final    Comment: (NOTE) Concentration     Interpretation   Follow-Up <16 - 50 ug/g     Normal           None >50 -120 ug/g     Borderline       Re-evaluate in 4-6 weeks    >120 ug/g     Abnormal         Repeat as clinically                                   indicated Performed At: [facility] [place], [place] [id number] Rush Farmer MD [id number]   Gastrointestinal Panel by PCR , Stool     Status: None   Collection Time: 06/15/18 12:43 PM  Result Value Ref Range Status   Campylobacter species NOT DETECTED NOT DETECTED Final   Plesimonas shigelloides NOT DETECTED NOT DETECTED Final   Salmonella species NOT DETECTED NOT DETECTED Final   Yersinia enterocolitica NOT DETECTED NOT DETECTED Final   Vibrio species NOT DETECTED NOT DETECTED Final   Vibrio cholerae NOT DETECTED NOT DETECTED Final   Enteroaggregative E coli (EAEC) NOT DETECTED NOT DETECTED Final   Enteropathogenic E coli (EPEC) NOT DETECTED NOT DETECTED Final   Enterotoxigenic E coli (ETEC) NOT DETECTED NOT DETECTED Final   Shiga like toxin producing E coli (STEC) NOT DETECTED NOT DETECTED Final   Shigella/Enteroinvasive E coli (EIEC) NOT DETECTED NOT DETECTED Final   Cryptosporidium NOT DETECTED NOT DETECTED Final   Cyclospora cayetanensis NOT DETECTED NOT DETECTED Final   Entamoeba histolytica NOT DETECTED NOT DETECTED Final   Giardia lamblia NOT DETECTED NOT DETECTED Final   Adenovirus F40/41 NOT DETECTED NOT DETECTED Final   Astrovirus NOT DETECTED NOT DETECTED Final   Norovirus GI/GII NOT DETECTED NOT DETECTED  Final   Rotavirus A NOT DETECTED NOT DETECTED Final   Sapovirus (I, II, IV, and V) NOT DETECTED NOT DETECTED Final    Comment: Performed at [facility], [address] [place], [place] [address]     Studies: No results found.  Scheduled Meds: . chlorhexidine  15 mL Mouth Rinse BID  . cholecalciferol  1,000 Units Oral Daily  . [START ON 06/19/2018] cosyntropin  0.25 mg Intravenous Once  . diphenoxylate-atropine  1 tablet Oral QID  . famotidine  20 mg Oral BID  . feeding supplement (ENSURE ENLIVE)  237 mL Oral TID BM  . folic acid  1 mg Per Tube Daily  . heparin  5,000 Units Subcutaneous Q8H  . insulin aspart  0-15 Units Subcutaneous Q4H  . levothyroxine  100 mcg Oral QAC breakfast  . mouth rinse  15 mL Mouth Rinse q12n4p  . multivitamin with minerals  1 tablet Oral Daily  . pantoprazole  40 mg Oral BID  . potassium chloride  40 mEq Oral Once  . tamsulosin  0.4 mg Oral Daily  . thiamine  100 mg Oral Daily  . vitamin B-12  250 mcg Oral Daily    Continuous Infusions: . sodium chloride 250 mL (06/15/18 2307)  . magnesium sulfate 1 - 4 g bolus IVPB       Time spent: 56mns, case discussed with GI Dr GCarlean PurlI have personally reviewed and interpreted on  06/18/2018 daily labs, tele strips, imagings as discussed above under date review session and assessment and plans.  I reviewed all nursing  notes, pharmacy notes, consultant notes,  vitals, pertinent old records  I have discussed plan of care as described above with RN , patient on 06/18/2018   Florencia Reasons MD, PhD  [facility] Pager [phone]. If 7PM-7AM, please contact night-coverage at [email or web address], password [facility] 06/18/2018, 8:22 AM  LOS: 7 days

## 2018-06-19 DIAGNOSIS — K7460 Unspecified cirrhosis of liver: Secondary | ICD-10-CM

## 2018-06-19 DIAGNOSIS — K7030 Alcoholic cirrhosis of liver without ascites: Secondary | ICD-10-CM

## 2018-06-19 LAB — CBC
HCT: 27.9 % — ABNORMAL LOW (ref 36.0–46.0)
Hemoglobin: 9.3 g/dL — ABNORMAL LOW (ref 12.0–15.0)
MCH: 37.1 pg — ABNORMAL HIGH (ref 26.0–34.0)
MCHC: 33.3 g/dL (ref 30.0–36.0)
MCV: 111.2 fL — AB (ref 78.0–100.0)
PLATELETS: 87 10*3/uL — AB (ref 150–400)
RBC: 2.51 MIL/uL — AB (ref 3.87–5.11)
RDW: 16.3 % — AB (ref 11.5–15.5)
WBC: 6.5 10*3/uL (ref 4.0–10.5)

## 2018-06-19 LAB — GLUCOSE, CAPILLARY
GLUCOSE-CAPILLARY: 141 mg/dL — AB (ref 70–99)
GLUCOSE-CAPILLARY: 114 mg/dL — AB (ref 70–99)
GLUCOSE-CAPILLARY: 113 mg/dL — AB (ref 70–99)
Glucose-Capillary: 121 mg/dL — ABNORMAL HIGH (ref 70–99)
Glucose-Capillary: 157 mg/dL — ABNORMAL HIGH (ref 70–99)
Glucose-Capillary: 77 mg/dL (ref 70–99)

## 2018-06-19 LAB — HEMOGLOBIN A1C
HEMOGLOBIN A1C: 6.1 % — AB (ref 4.8–5.6)
MEAN PLASMA GLUCOSE: 128.37 mg/dL

## 2018-06-19 LAB — BASIC METABOLIC PANEL
ANION GAP: 7 (ref 5–15)
BUN: 7 mg/dL (ref 6–20)
CHLORIDE: 103 mmol/L (ref 98–111)
CO2: 24 mmol/L (ref 22–32)
Calcium: 7.6 mg/dL — ABNORMAL LOW (ref 8.9–10.3)
Creatinine, Ser: 1.1 mg/dL — ABNORMAL HIGH (ref 0.44–1.00)
GFR calc Af Amer: >60 mL/min (ref >60)
GFR calc non Af Amer: 54 mL/min — ABNORMAL LOW (ref >60)
GLUCOSE: 127 mg/dL — AB (ref 70–99)
POTASSIUM: 3 mmol/L — AB (ref 3.5–5.1)
Sodium: 134 mmol/L — ABNORMAL LOW (ref 135–145)

## 2018-06-19 LAB — PHOSPHORUS: PHOSPHORUS: 2.4 mg/dL — AB (ref 2.5–4.6)

## 2018-06-19 LAB — ACTH STIMULATION, 3 TIME POINTS
CORTISOL 60 MIN: 13.9 ug/dL
CORTISOL BASE: 13.2 ug/dL
Cortisol, 30 Min: 13.4 ug/dL

## 2018-06-19 LAB — MAGNESIUM: MAGNESIUM: 2.1 mg/dL (ref 1.7–2.4)

## 2018-06-19 MED ORDER — FLUDROCORTISONE ACETATE 0.1 MG PO TABS
0.1000 mg | ORAL_TABLET | Freq: Every day | ORAL | Status: DC
  Administered 2018-06-20 – 2018-06-23 (×4): 0.1 mg via ORAL
  Filled 2018-06-19 (×6): qty 1

## 2018-06-19 MED ORDER — LOPERAMIDE HCL 2 MG PO CAPS
4.0000 mg | ORAL_CAPSULE | Freq: Two times a day (BID) | ORAL | Status: DC
  Administered 2018-06-19 – 2018-06-23 (×10): 4 mg via ORAL
  Filled 2018-06-19 (×10): qty 2

## 2018-06-19 MED ORDER — POTASSIUM CHLORIDE CRYS ER 20 MEQ PO TBCR
40.0000 meq | EXTENDED_RELEASE_TABLET | ORAL | Status: AC
  Administered 2018-06-19 (×2): 40 meq via ORAL
  Filled 2018-06-19 (×2): qty 2

## 2018-06-19 MED ORDER — BOOST / RESOURCE BREEZE PO LIQD CUSTOM
1.0000 | Freq: Three times a day (TID) | ORAL | Status: DC
  Administered 2018-06-19 – 2018-06-20 (×5): 1 via ORAL
  Filled 2018-06-19 (×16): qty 1

## 2018-06-19 NOTE — Progress Notes (Signed)
PROGRESS NOTE  Deborah Russo WNU:[id number] DOB: 02/16/1961 DOA: 06/11/2018 PCP: Deborah Maize, MD  Brief summary:  Deborah Russo is an 57 y.o. female.  She was admitted June 23 for altered mental status thought to be due to polysubstance abuse.  Had a similar presentation one year ago.  "Discharge summary from  1 year ago was reviewed when she presented with altered mental status.  There was diagnosed with acute toxic encephalopathy thought to be due to polypharmacy with unintentional overdose.  However it was also thought that she may have had a stroke at that time she refused further evaluation."    Per ED initial intake "From home- sister moved out a week ago, Called EMS for SOB, was sitting on front porch of filthy home per EMS"    Was also found to be hypotensive.Was intubated for airway protection and received fluid resuscitation.  Reports long history of abdominal pain treated with narcotics, persistent nausea vomiting and profuse diarrhea. Reportedly had not been eating or drinking normally and taking none of her medication   She  is extubated on June 24 and off pressors on 6/26 ,she is  transferred to hospitalist service June 28 She underwent sigmoidoscope on June 28, continue have ab pain and loose stool ,gi following      HPI/Recap of past 24 hours:  She continue to show improvement, this am she is oriented to place/time/person.   no fever  Assessment/Plan: Active Problems:   Hypotension   Colitis   Cirrhosis of liver without ascites (HCC)  Pancolitis with profuse diarrhea, currently have a rectal tube , she c/o epigastric pain which is chronic by patient's report :  -patient reports chronic Abdominal pain/ chronic diarrhea, she reports she is on pain meds for chronic abdominal pain --gi pcr panel negative -she is on protonix bid and pepecid bid  -gi consulted, she is to have sigmoidoscope on 6/28, will follow gi recommendation  Electrolytes  abnormalities: she is found to have severe electrolytes abnormalities on presentation   Hypokalemia: k on presentation less than 2, improving, but remain low, continue to replace to keep k>4 Hypomagnesemia: mag 1.3 on presentation, replaced, improving, keep replace to keep mag >2 Hypocalcemia: calcium on presentation was 5.3, replaced much improved, calcium 7.9, albumin 2.6 Hypophosphatemia: phos on presentation is less than 1, replaced  Encephalopathy: -Ct head no acute findings, 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 ( perc chart review she was hospitalized for left mca stroke in 03/2017) -supportive care , appear improving -start PT, she reports she uses a walker for many years at baseline   Macrocytic anemia mcv 107 on presentation hgb around 9-10 ( hgb a year ago around 13-14)  Hypothyroidism:  tsh on presentation was 80 She is started on syntrhoids ( per chart review she has a long history of noncompliance with meds)  Adrenal insufficiency:  Random cortisol 13, no increase after cosyntropin administration. Ct ab "bilateral adrenal glands are normal in appearance." Will start florinef 0.61m daily   Low b12, started on supplement  Thrombocytopenia: plt on presentation was 715 ( a year ago was 117-146 per chart reveiw) CT ab showed "Hepatic steatosis with evidence of cirrhosis."  With trace ascites, spleen unremarkable  She has a foley placed on 6/22 due to urinary retention, foley removed on 6/28, repeat  bladder scan on 6/29, again urinary retention of 643cc, start flomax, start Physical therapy, reinsert foley  Aortic atherosclerosis, including high-grade stenosis or occlusion of the left common  iliac artery. with distal reconstitution of flow related to collateral Pathways On CT ab  Body mass index is 35.12 kg/m.   Smoker : smoking cessation education provided at length> 54mn  Chronic pain ( epigastric pain) [facility] "Per outpatient's note  from 09/2016  Chronic migraine: Followed by Deborah Russo start throughout head, different places Takes oxycontin BID for headaches  Chronic pain:  Has had rib cage pain from nerve damage from pregnancy  Has had gall bladder removed Has had exploratory lap to eval for pain source in over 20 yrs ago S/p hysterectomy Last had EGD years ago (2015) No EtOH use Does take ibuprofen when her stomach hurts"   FTT: pt eval recommended SNF placement once medically stable  Code Status: full  Family Communication: patient , she declined my offer to call her family  Disposition Plan: SNF placement once cleared by GI   Consultants:  GI  Procedures: Central line placement in the ED  Intubation/extubation  Sigmoidoscope on 6/28  Antibiotics:  Zosyn from 6/23 to 6/27   Objective: BP (!) 102/54 (BP Location: Right Arm)   Pulse 79   Temp 97.9 F (36.6 C) (Oral)   Resp 19   Ht 5' 2"  (1.575 m)   Wt 87.1 kg (192 lb 0.3 oz)   SpO2 (!) 88%   BMI 35.12 kg/m   Intake/Output Summary (Last 24 hours) at 06/19/2018 0745 Last data filed at 06/19/2018 0600 Gross per 24 hour  Intake 800 ml  Output 1265 ml  Net -465 ml   Filed Weights   06/13/18 0341 06/14/18 0350 06/15/18 0521  Weight: 84.4 kg (186 lb 1.1 oz) 84.7 kg (186 lb 11.7 oz) 87.1 kg (192 lb 0.3 oz)    Exam: Patient is examined daily including today on 06/19/2018, exams remain the same as of yesterday except that has changed    General:  More awake and interactive today, she is oriented to place and person,  she is not oriented to time  Cardiovascular: RRR  Respiratory: CTABL  Abdomen: epigastric tenderness, Soft/ND, positive BS, well healed prior midline surgical scar    Musculoskeletal: No Edema  Neuro: alert, oriented to place and person, not to time  Data Reviewed: Basic Metabolic Panel: Recent Labs  Lab 06/13/18 0349  06/14/18 0415 06/14/18 0417 06/14/18 1813 06/15/18 0741 06/15/18 1940  06/17/18 0810 06/18/18 0649 06/19/18 0529  NA 133*   < > 135  --  137 139 136 137 134* 134*  K 2.5*   < > 3.5  --  3.7 4.4 4.9 3.2* 3.2* 3.0*  CL 100   < > 103  --  108 109 108 107 107 103  CO2 24   < > 25  --  23 20* 20* 21* 23 24  GLUCOSE 148*   < > 197*  --  129* 129* 134* 145* 119* 127*  BUN <5*   < > <5*  --  <5* <5* <5* <5* <5* 7  CREATININE 0.99   < > 1.05*  --  1.00 0.93 0.98 0.93 1.01* 1.10*  CALCIUM 7.8*   < > 7.5* 7.4*  7.2* 7.8* 7.9* 7.9* 7.9* 7.7* 7.6*  MG 2.0  --   --  1.8  --  2.2  --  1.9 1.8  --   PHOS  --   --  1.3*  --  1.1* 1.7* 2.6  --   --  2.4*   < > = values in this  interval not displayed.   Liver Function Tests: Recent Labs  Lab 06/14/18 0415 06/14/18 1813 06/15/18 0741 06/15/18 1940 06/18/18 0649  AST  --   --   --   --  15  ALT  --   --   --   --  11  ALKPHOS  --   --   --   --  77  BILITOT  --   --   --   --  0.5  PROT  --   --   --   --  4.7*  ALBUMIN 2.3* 2.4* 2.6* 2.6* 2.1*   No results for input(s): LIPASE, AMYLASE in the last 168 hours. Recent Labs  Lab 06/17/18 0810  AMMONIA 43*   CBC: Recent Labs  Lab 06/17/18 0810  WBC 10.7*  HGB 9.7*  HCT 28.4*  MCV 105.2*  PLT 78*   Cardiac Enzymes:   Recent Labs  Lab 06/12/18 1031  CKTOTAL 274*   BNP (last 3 results) Recent Labs    06/11/18 2114  BNP 226.2*    ProBNP (last 3 results) No results for input(s): PROBNP in the last 8760 hours.  CBG: Recent Labs  Lab 06/18/18 1204 06/18/18 1635 06/18/18 2005 06/19/18 0010 06/19/18 0434  GLUCAP 118* 128* 97 77 141*    Recent Results (from the past 240 hour(s))  Urine culture     Status: None   Collection Time: 06/11/18 11:37 AM  Result Value Ref Range Status   Specimen Description   Final    URINE, CLEAN CATCH Performed at [facility], [address], [place], [place] [address]    Special Requests   Final    NONE Performed at [facility], [address], [place], [place] [address]    Culture   Final    NO  GROWTH Performed at [facility], [place] [address] [place], [place] [address]    Report Status 06/13/2018 FINAL  Final  Blood Culture (routine x 2)     Status: None   Collection Time: 06/11/18 11:41 AM  Result Value Ref Range Status   Specimen Description BLOOD LEFT ARM  Final   Special Requests   Final    BOTTLES DRAWN AEROBIC ONLY Blood Culture adequate volume   Culture   Final    NO GROWTH 5 DAYS Performed at [facility], [address], [place], [place] [address]    Report Status 06/16/2018 FINAL  Final  Blood Culture (routine x 2)     Status: None   Collection Time: 06/11/18 12:36 PM  Result Value Ref Range Status   Specimen Description BLOOD RIGHT HAND  Final   Special Requests   Final    BOTTLES DRAWN AEROBIC AND ANAEROBIC Blood Culture adequate volume   Culture   Final    NO GROWTH 5 DAYS Performed at [facility], [address], [place], [place] [address]    Report Status 06/16/2018 FINAL  Final  MRSA PCR Screening     Status: None   Collection Time: 06/11/18  6:34 PM  Result Value Ref Range Status   MRSA by PCR NEGATIVE NEGATIVE Final    Comment:        The GeneXpert MRSA Assay (FDA approved for NASAL specimens only), is one component of a comprehensive MRSA colonization surveillance program. It is not intended to diagnose MRSA infection nor to guide or monitor treatment for MRSA infections. Performed at [facility], [place] [address] [place], [place] [address]  Culture, blood (routine x 2)     Status: None   Collection Time: 06/11/18  7:40 PM  Result Value Ref Range Status   Specimen Description BLOOD RIGHT HAND  Final   Special Requests   Final    BOTTLES DRAWN AEROBIC ONLY Blood Culture adequate volume   Culture   Final    NO GROWTH 5 DAYS Performed at [facility], [address] [place], [place] [address]    Report Status 06/16/2018 FINAL  Final  Culture, blood (routine x 2)     Status: None   Collection Time: 06/11/18  7:45 PM   Result Value Ref Range Status   Specimen Description BLOOD RIGHT ANTECUBITAL  Final   Special Requests   Final    BOTTLES DRAWN AEROBIC ONLY Blood Culture adequate volume   Culture   Final    NO GROWTH 5 DAYS Performed at [facility], [place] [address], [place], [place] [address]    Report Status 06/16/2018 FINAL  Final  Culture, respiratory (tracheal aspirate)     Status: None   Collection Time: 06/11/18 10:57 PM  Result Value Ref Range Status   Specimen Description TRACHEAL ASPIRATE  Final   Special Requests NONE  Final   Gram Stain   Final    WBC PRESENT, PREDOMINANTLY PMN NO ORGANISMS SEEN FEW GRAM POSITIVE RODS FEW YEAST    Culture   Final    Consistent with normal respiratory flora. Performed at [facility], [place] [address], [place], [place] [address]    Report Status 06/14/2018 FINAL  Final  C Difficile Quick Screen w PCR reflex     Status: None   Collection Time: 06/13/18  4:19 PM  Result Value Ref Range Status   C Diff antigen NEGATIVE NEGATIVE Final   C Diff toxin NEGATIVE NEGATIVE Final   C Diff interpretation No C. difficile detected.  Final    Comment: Performed at [facility], [place] [address], [place], [place] [address]  OVA + PARASITE EXAM     Status: None   Collection Time: 06/13/18  7:12 PM  Result Value Ref Range Status   OVA + PARASITE EXAM Final report  Final    Comment: (NOTE) These results were obtained using wet preparation(s) and trichrome stained smear. This test does not include testing for Cryptosporidium parvum, Cyclospora, or Microsporidia. Performed At: [place], [place] [id number] Elwanda Brooklyn R MD TA:[id number]    Source of Sample STOOL  Final  Calprotectin, Fecal     Status: None   Collection Time: 06/13/18  7:12 PM  Result Value Ref Range Status   Calprotectin, Fecal 39 0 - 120 ug/g Final    Comment: (NOTE) Concentration     Interpretation   Follow-Up <16 - 50 ug/g     Normal            None >50 -120 ug/g     Borderline       Re-evaluate in 4-6 weeks    >120 ug/g     Abnormal         Repeat as clinically                                   indicated Performed At: [facility] [address] [place], [place] [id number] Rush Farmer MD BE:[id number]   Gastrointestinal Panel by PCR ,  Stool     Status: None   Collection Time: 06/15/18 12:43 PM  Result Value Ref Range Status   Campylobacter species NOT DETECTED NOT DETECTED Final   Plesimonas shigelloides NOT DETECTED NOT DETECTED Final   Salmonella species NOT DETECTED NOT DETECTED Final   Yersinia enterocolitica NOT DETECTED NOT DETECTED Final   Vibrio species NOT DETECTED NOT DETECTED Final   Vibrio cholerae NOT DETECTED NOT DETECTED Final   Enteroaggregative E coli (EAEC) NOT DETECTED NOT DETECTED Final   Enteropathogenic E coli (EPEC) NOT DETECTED NOT DETECTED Final   Enterotoxigenic E coli (ETEC) NOT DETECTED NOT DETECTED Final   Shiga like toxin producing E coli (STEC) NOT DETECTED NOT DETECTED Final   Shigella/Enteroinvasive E coli (EIEC) NOT DETECTED NOT DETECTED Final   Cryptosporidium NOT DETECTED NOT DETECTED Final   Cyclospora cayetanensis NOT DETECTED NOT DETECTED Final   Entamoeba histolytica NOT DETECTED NOT DETECTED Final   Giardia lamblia NOT DETECTED NOT DETECTED Final   Adenovirus F40/41 NOT DETECTED NOT DETECTED Final   Astrovirus NOT DETECTED NOT DETECTED Final   Norovirus GI/GII NOT DETECTED NOT DETECTED Final   Rotavirus A NOT DETECTED NOT DETECTED Final   Sapovirus (I, II, IV, and V) NOT DETECTED NOT DETECTED Final    Comment: Performed at [facility], [address] [place], [place] [address]     Studies: No results found.  Scheduled Meds: . chlorhexidine  15 mL Mouth Rinse BID  . cholecalciferol  1,000 Units Oral Daily  . diphenoxylate-atropine  1 tablet Oral QID  . enoxaparin (LOVENOX) injection  40 mg Subcutaneous Q24H  . famotidine  20 mg Oral BID  .  feeding supplement (ENSURE ENLIVE)  237 mL Oral TID BM  . folic acid  1 mg Per Tube Daily  . insulin aspart  0-15 Units Subcutaneous Q4H  . levothyroxine  100 mcg Oral QAC breakfast  . mouth rinse  15 mL Mouth Rinse q12n4p  . multivitamin with minerals  1 tablet Oral Daily  . pantoprazole  40 mg Oral BID  . potassium chloride  40 mEq Oral Q4H  . tamsulosin  0.4 mg Oral Daily  . thiamine  100 mg Oral Daily  . vitamin B-12  250 mcg Oral Daily    Continuous Infusions: . sodium chloride 250 mL (06/15/18 2307)     Time spent: 16mns I have personally reviewed and interpreted on  06/19/2018 daily labs, tele strips, imagings as discussed above under date review session and assessment and plans.  I reviewed all nursing notes, pharmacy notes, consultant notes,  vitals, pertinent old records  I have discussed plan of care as described above with RN , patient on 06/19/2018   FFlorencia ReasonsMD, PhD  [facility] Pager [phone] If 7PM-7AM, please contact night-coverage at [email or web address], password [email or web address]7/12/2017, 7:45 AM  LOS: 8 days

## 2018-06-19 NOTE — Progress Notes (Signed)
Nutrition Follow-up  DOCUMENTATION CODES:   Obesity unspecified  INTERVENTION:   Boost Breeze po TID, each supplement provides 250 kcal and 9 grams of protein  D/C Ensure Enlive   Recommend Soft (GI/easy to digest) Diet with no further restrictions at this time  NUTRITION DIAGNOSIS:   Inadequate oral intake related to acute illness as evidenced by NPO status.  Being addressed via supplements  GOAL:   Patient will meet greater than or equal to 90% of their needs  Progressing  MONITOR:   TF tolerance, Vent status, Labs, Weight trends  REASON FOR ASSESSMENT:   Consult Assessment of nutrition requirement/status  ASSESSMENT:   57 yo female admitted with AMS, intubated for airway protection. Pt with hx of substance abuse, stroke, chronic pain, diarrhea   6/28 Flex sig , mucosal biopsies take with path pending  Diet just advanced from FL to Solids this AM. Pt reports she drank some coffee this AM, reports she is eating a little better. Per RN, pt not eating well at all. Pt not taking Ensure Enlive, does not like. Prefers Boost Breeze  Pt continues with liquid stool via rectal tube  Weight trending up since admission  Labs:  Sodium 134, potassium 3.0, phosphorus 2.4, Creatinine 1.10 Meds: ss novolog, MVI, imodium, lomotil, vitamin D, folic acid, B-1, I-77, KCl  Diet Order:   Diet Order           Diet Heart Room service appropriate? Yes; Fluid consistency: Thin  Diet effective now          EDUCATION NEEDS:   No education needs have been identified at this time  Skin:  Skin Assessment: Reviewed RN Assessment  Last BM:  6/27 watery stool via rectal tube  Height:   Ht Readings from Last 1 Encounters:  06/11/18 5' 2"  (1.575 m)    Weight:   Wt Readings from Last 1 Encounters:  06/19/18 206 lb (93.4 kg)    Ideal Body Weight:     BMI:  Body mass index is 37.68 kg/m.  Estimated Nutritional Needs:   Kcal:  8242-3536 kcals  Protein:  85-100  g  Fluid:  >/= 1.5 L   Kerman Passey MS, RD, LDN, CNSC [phone] Pager  [phone] Weekend/On-Call Pager

## 2018-06-19 NOTE — Care Management Important Message (Signed)
Important Message  Patient Details  Name: Deborah Russo MRN: [id number] Date of Birth: [birth year] Due to illness patiet  Medicare Important Message Given:  No  Due to illness patient did not sign.  Unsigned copy left.  [name] 06/19/2018, 2:03 PM

## 2018-06-19 NOTE — Progress Notes (Signed)
[facility] Progress Note    Since last GI note: Sitting up, eating breakfast. Feels overall OK. She doesn't like the food here.  Objective: Vital signs in last 24 hours: Temp:  [97.9 F (36.6 C)-98.4 F (36.9 C)] 97.9 F (36.6 C) (07/01 0433) Pulse Rate:  [79-80] 79 (07/01 0433) Resp:  [16-19] 19 (07/01 0433) BP: (87-104)/(52-70) 102/54 (07/01 0433) SpO2:  [88 %-97 %] 88 % (07/01 0433) Last BM Date: 06/17/18 General: alert and oriented times 3 Heart: regular rate and rythm Abdomen: soft, non-tender, non-distended, normal bowel sounds Epic output: stool charted 600 cc since yesterday around this time, not sure if this is accurate or uptodate. Certainly there is a lot of watery brown stool in her flexiseal bag.  Lab Results: Recent Labs    06/17/18 0810  WBC 10.7*  HGB 9.7*  PLT 78*  MCV 105.2*   Recent Labs    06/17/18 0810 06/18/18 0649 06/19/18 0529  NA 137 134* 134*  K 3.2* 3.2* 3.0*  CL 107 107 103  CO2 21* 23 24  GLUCOSE 145* 119* 127*  BUN <5* <5* 7  CREATININE 0.93 1.01* 1.10*  CALCIUM 7.9* 7.7* 7.6*   Recent Labs    06/18/18 0649  PROT 4.7*  ALBUMIN 2.1*  AST 15  ALT 11  ALKPHOS 77  BILITOT 0.5     Medications: Scheduled Meds: . chlorhexidine  15 mL Mouth Rinse BID  . cholecalciferol  1,000 Units Oral Daily  . diphenoxylate-atropine  1 tablet Oral QID  . enoxaparin (LOVENOX) injection  40 mg Subcutaneous Q24H  . famotidine  20 mg Oral BID  . feeding supplement (ENSURE ENLIVE)  237 mL Oral TID BM  . folic acid  1 mg Per Tube Daily  . insulin aspart  0-15 Units Subcutaneous Q4H  . levothyroxine  100 mcg Oral QAC breakfast  . mouth rinse  15 mL Mouth Rinse q12n4p  . multivitamin with minerals  1 tablet Oral Daily  . pantoprazole  40 mg Oral BID  . potassium chloride  40 mEq Oral Q4H  . tamsulosin  0.4 mg Oral Daily  . thiamine  100 mg Oral Daily  . vitamin B-12  250 mcg Oral Daily   Continuous Infusions: . sodium chloride  250 mL (06/15/18 2307)   PRN Meds:.sodium chloride, oxyCODONE-acetaminophen, promethazine    Assessment/Plan: 57 y.o. female with polysubtance abuse, cirrhosis, chronic diarrhea and pan colitis on CT scan  Flex sigmoidoscopy on Friday was pretty underwhelming, certainly compared to the CT findings.  Mucosal biopsies of the left colon were taken, await final path results.   Infectious etiology has been ruled out essentially (stool path, ova and parasites, c. Diff testing all negative).  She is currently getting 1 lomotil four times daily. I"m going to add two imodium twice daily this morning as well.   Will also change her from a full liquid diet to heart smart (low salt solid).     Milus Banister, MD  06/19/2018, 8:32 AM [facility] Pager [phone]

## 2018-06-19 NOTE — Clinical Social Work Note (Addendum)
Patient oriented to self only. No family at bedside. CSW attempted calling daughter (phone number was for [facility] and they did not know patient's daughter) and all three of patient's sisters. Vickie's voicemail listed someone else's name. Left voicemail for Tanya. No answer/voicemail from [place]. Will discuss SNF when communication with family established.  Dayton Scrape, [place] [phone]  2:52 pm Received call back from Sharen Hones. CSW asked about SNF. Ms. Rowe Robert replied, "I don't have anything to do with that. I'm just an old aunt." She states she doubts she will talk to patient's other family members but will have them call if she does.  Dayton Scrape, [place]

## 2018-06-20 LAB — GLUCOSE, CAPILLARY
GLUCOSE-CAPILLARY: 112 mg/dL — AB (ref 70–99)
GLUCOSE-CAPILLARY: 136 mg/dL — AB (ref 70–99)
GLUCOSE-CAPILLARY: 146 mg/dL — AB (ref 70–99)
Glucose-Capillary: 132 mg/dL — ABNORMAL HIGH (ref 70–99)
Glucose-Capillary: 121 mg/dL — ABNORMAL HIGH (ref 70–99)
Glucose-Capillary: 131 mg/dL — ABNORMAL HIGH (ref 70–99)

## 2018-06-20 LAB — BASIC METABOLIC PANEL
ANION GAP: 7 (ref 5–15)
BUN: 6 mg/dL (ref 6–20)
CALCIUM: 7.9 mg/dL — AB (ref 8.9–10.3)
CO2: 21 mmol/L — ABNORMAL LOW (ref 22–32)
Chloride: 106 mmol/L (ref 98–111)
Creatinine, Ser: 1.09 mg/dL — ABNORMAL HIGH (ref 0.44–1.00)
GFR, EST NON AFRICAN AMERICAN: 55 mL/min — AB (ref >60)
Glucose, Bld: 122 mg/dL — ABNORMAL HIGH (ref 70–99)
POTASSIUM: 3.8 mmol/L (ref 3.5–5.1)
SODIUM: 134 mmol/L — AB (ref 135–145)

## 2018-06-20 LAB — PHOSPHORUS: PHOSPHORUS: 2.4 mg/dL — AB (ref 2.5–4.6)

## 2018-06-20 MED ORDER — METRONIDAZOLE 500 MG PO TABS
500.0000 mg | ORAL_TABLET | Freq: Three times a day (TID) | ORAL | Status: DC
  Administered 2018-06-20 – 2018-06-24 (×11): 500 mg via ORAL
  Filled 2018-06-20 (×11): qty 1

## 2018-06-20 NOTE — NC FL2 (Signed)
[place] LEVEL OF CARE SCREENING TOOL     IDENTIFICATION  Patient Name: Deborah Russo Birthdate: 1961/01/06 Sex: female Admission Date (Current Location): 06/11/2018  [facility] and [place] Number:  [employer] and Address:  [facility] [place], [place] [address], [place], [place] [address]      Provider Number: [id number]  Attending Physician Name and Address:  Florencia Reasons, MD  Relative Name and Phone Number:       Current Level of Care: Hospital Recommended Level of Care: [place] Prior Approval Number:    Date Approved/Denied:   PASRR Number: [id number]  Discharge Plan: SNF    Current Diagnoses: Patient Active Problem List   Diagnosis Date Noted  . Colitis   . Cirrhosis of liver without ascites ([place])   . Hypotension 06/11/2018  . Altered mental status 06/17/2017  . Polypharmacy   . Palliative care encounter   . Goals of care, counseling/discussion   . DNR (do not resuscitate) discussion   . Acute CVA (cerebrovascular accident) ([place]) 03/29/2017  . Tobacco abuse 03/29/2017  . HTN (hypertension) 03/29/2017  . Aortic atherosclerosis ([place]) 09/29/2016  . Chronic migraine 09/29/2016  . H/O Clostridium difficile infection 07/13/2014  . Diarrhea 07/13/2014  . FTT (failure to thrive) in adult 07/13/2014  . Abdominal pain, unspecified site 07/13/2014  . Chronic abdominal pain   . Abdominal pain 06/19/2014  . Hypothyroidism 06/19/2014  . Hypokalemia 06/19/2014  . Chronic pain 06/19/2014  . Anemia 06/19/2014  . Abscess of axilla, left 03/28/2013    Orientation RESPIRATION BLADDER Height & Weight     Self, Time, Place  Normal Incontinent, Indwelling catheter Weight: 206 lb (93.4 kg) Height:  5' 2"  (157.5 cm)  BEHAVIORAL SYMPTOMS/MOOD NEUROLOGICAL BOWEL NUTRITION STATUS  (None) (None) Incontinent(Rectal tube with balloon) Diet(Soft)  AMBULATORY STATUS COMMUNICATION OF NEEDS Skin   Limited Assist Verbally  Bruising, Other (Comment)(Cracking, MASD.)                       Personal Care Assistance Level of Assistance              Functional Limitations Info  Sight, Hearing, Speech Sight Info: Adequate Hearing Info: Adequate Speech Info: Adequate    SPECIAL CARE FACTORS FREQUENCY  PT (By licensed PT), Blood pressure     PT Frequency: 5 x week              Contractures Contractures Info: Not present    Additional Factors Info  Code Status, Allergies Code Status Info: Full code Allergies Info: Penicillins, Sulfa Antibiotics           Current Medications (06/20/2018):  This is the current hospital active medication list Current Facility-Administered Medications  Medication Dose Route Frequency Provider Last Rate Last Dose  . 0.9 %  sodium chloride infusion  250 mL Intravenous PRN [name], MD 10 mL/hr at 06/15/18 2307 250 mL at 06/15/18 2307  . chlorhexidine (PERIDEX) 0.12 % solution 15 mL  15 mL Mouth Rinse BID [name], MD   15 mL at 06/20/18 0905  . cholecalciferol (VITAMIN D) tablet 1,000 Units  1,000 Units Oral Daily [name], MD   1,000 Units at 06/20/18 [phone]  . diphenoxylate-atropine (LOMOTIL) 2.5-0.025 MG per tablet 1 tablet  1 tablet Oral QID [name], MD   1 tablet at 06/20/18 0904  . enoxaparin (LOVENOX) injection 40 mg  40 mg Subcutaneous Q24H Erlinda Hong,  Annamaria Boots, MD   40 mg at 06/19/18 2151  . famotidine (PEPCID) tablet 20 mg  20 mg Oral BID Karren Cobble, RPH   20 mg at 06/20/18 1115  . feeding supplement (BOOST / RESOURCE BREEZE) liquid 1 Container  1 Container Oral TID BM Florencia Reasons, MD   1 Container at 06/20/18 1320  . fludrocortisone (FLORINEF) tablet 0.1 mg  0.1 mg Oral Daily Florencia Reasons, MD   0.1 mg at 06/20/18 1055  . folic acid (FOLVITE) tablet 1 mg  1 mg Per Tube Daily [name], MD   1 mg at 06/20/18 0904  . insulin aspart (novoLOG) injection 0-15 Units  0-15 Units Subcutaneous Q4H [name], MD   2 Units at 06/20/18  1259  . levothyroxine (SYNTHROID, LEVOTHROID) tablet 100 mcg  100 mcg Oral QAC breakfast Karren Cobble, RPH   100 mcg at 06/20/18 0859  . loperamide (IMODIUM) capsule 4 mg  4 mg Oral BID Milus Banister, MD   4 mg at 06/20/18 5208  . MEDLINE mouth rinse  15 mL Mouth Rinse q12n4p [name], MD   15 mL at 06/20/18 1259  . metroNIDAZOLE (FLAGYL) tablet 500 mg  500 mg Oral Q8H Milus Banister, MD   500 mg at 06/20/18 1319  . multivitamin with minerals tablet 1 tablet  1 tablet Oral Daily [name], MD   1 tablet at 06/20/18 0904  . oxyCODONE-acetaminophen (PERCOCET/ROXICET) 5-325 MG per tablet 1 tablet  1 tablet Oral Q8H PRN Florencia Reasons, MD   1 tablet at 06/20/18 0905  . promethazine (PHENERGAN) injection 12.5 mg  12.5 mg Intravenous Q6H PRN [name], MD   12.5 mg at 06/14/18 0348  . tamsulosin (FLOMAX) capsule 0.4 mg  0.4 mg Oral Daily Florencia Reasons, MD   0.4 mg at 06/20/18 0905  . thiamine (VITAMIN B-1) tablet 100 mg  100 mg Oral Daily Karren Cobble, RPH   100 mg at 06/20/18 0223  . vitamin B-12 (CYANOCOBALAMIN) tablet 250 mcg  250 mcg Oral Daily [name], MD   250 mcg at 06/20/18 3612     Discharge Medications: Please see discharge summary for a list of discharge medications.  Relevant Imaging Results:  Relevant Lab Results:   Additional Information SS#: [id number]  Candie Chroman, LCSW

## 2018-06-20 NOTE — Progress Notes (Signed)
PROGRESS NOTE  Deborah Russo IZT:[id number] DOB: 01-26-1961 DOA: 06/11/2018 PCP: Eustaquio Maize, MD  Brief summary:  Deborah Russo is an 57 y.o. female.  She was admitted June 23 for altered mental status thought to be due to polysubstance abuse.  Had a similar presentation one year ago.  "Discharge summary from  1 year ago was reviewed when she presented with altered mental status.  There was diagnosed with acute toxic encephalopathy thought to be due to polypharmacy with unintentional overdose.  However it was also thought that she may have had a stroke at that time she refused further evaluation."  Per ED initial intake "From home- sister moved out a week ago, Called EMS for SOB, was sitting on front porch of filthy home per EMS"   Was also found to be hypotensive.Was intubated for airway protection and received fluid resuscitation.  Reports long history of abdominal pain treated with narcotics, persistent nausea vomiting and profuse diarrhea. Reportedly had not been eating or drinking normally and taking none of her medication   She  is extubated on June 24 and off pressors on 6/26 ,she is  transferred to hospitalist service June 28 She underwent sigmoidoscope on June 28, continue have ab pain and loose stool ,gi following   HPI/Recap of past 24 hours:  She continue to c/o epigastric pain, continue have loose stool in rectal tube Mental status improved, this am she is oriented to place/time/person.   no fever  Assessment/Plan: Active Problems:   Hypotension   Colitis   Cirrhosis of liver without ascites (HCC)  Pancolitis with profuse diarrhea, currently have a rectal tube , she c/o epigastric pain which is chronic by patient's report :  -patient reports chronic Abdominal pain/ chronic diarrhea, she reports she is on pain meds for chronic abdominal pain -she reports has been having epigastric pain since she give birth to her daughter, epigastric pain get better with  putting pressure on.  --gi pcr panel negative - protonix stopped per GI due to concerns of diarrhea side effect, she is on  pepecid bid  -s/p sigmoidoscope on 6/28 -gi following,  will follow gi recommendation  Electrolytes abnormalities: she is found to have severe electrolytes abnormalities on presentation   Hypokalemia: k on presentation less than 2, improving, but remain low, continue to replace to keep k>4 Hypomagnesemia: mag 1.3 on presentation, replaced, improving, keep replace to keep mag >2 Hypocalcemia: calcium on presentation was 5.3, replaced much improved, calcium 7.9, albumin 2.6 Hypophosphatemia: phos on presentation is less than 1, replaced  Encephalopathy: -Ct head no acute findings, 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 ( perc chart review she was hospitalized for left mca stroke in 03/2017) -supportive care ,  Improving, she is AAOX3 on 7/2 -start PT, she reports she uses a walker for many years at baseline   Macrocytic anemia mcv 107 on presentation hgb around 9-10 ( hgb a year ago around 13-14)  Hypothyroidism:  tsh on presentation was 80 She is started on syntrhoids ( per chart review she has a long history of noncompliance with meds)  Adrenal insufficiency:  From long term opioids use? Random cortisol 13, no increase after cosyntropin administration. Ct ab "bilateral adrenal glands are normal in appearance." Started on  florinef 0.32m daily   Low b12, started on supplement  Thrombocytopenia: plt on presentation was 723 ( a year ago was 117-146 per chart reveiw) CT ab showed "Hepatic steatosis with evidence of cirrhosis."  With trace ascites, spleen  unremarkable  She has a foley placed on 6/22 due to urinary retention, foley removed on 6/28, repeat  bladder scan on 6/29, again urinary retention of 643cc, reinsert foley start trial of flomax, start Physical therapy,  Plan to have voiding trial prior to discharge, if she fail again, may need to d/c  to snf with foley and urology follow up.  Aortic atherosclerosis, including high-grade stenosis or occlusion of the left common iliac artery. with distal reconstitution of flow related to collateral Pathways On CT ab  Body mass index is 37.68 kg/m.   Smoker : smoking cessation education provided .  Chronic pain ( epigastric pain) [facility] "Per outpatient's note from 09/2016  Chronic migraine: Followed by Dr. Beatris Si Headaches start throughout head, different places Takes oxycontin BID for headaches  Chronic pain:  Has had rib cage pain from nerve damage from pregnancy  Has had gall bladder removed Has had exploratory lap to eval for pain source in over 20 yrs ago S/p hysterectomy Last had EGD years ago (2015) No EtOH use Does take ibuprofen when her stomach hurts"  She reports she has not followed up with pain clinic for several months if not longer. She reports she run of pain meds a several weeks ago, not sure if story reliable.   FTT: pt eval recommended SNF placement once medically stable  Code Status: full  Family Communication: patient , she declined my offer to call her family  Disposition Plan: SNF placement once cleared by GI   Consultants:  GI  Procedures: Central line placement in the ED  Intubation/extubation  Sigmoidoscope on 6/28  Antibiotics:  Zosyn from 6/23 to 6/27   Objective: BP 103/64 (BP Location: Right Arm)   Pulse 80   Temp 98.8 F (37.1 C) (Oral)   Resp 18   Ht 5' 2"  (1.575 m)   Wt 93.4 kg (206 lb)   SpO2 98%   BMI 37.68 kg/m   Intake/Output Summary (Last 24 hours) at 06/20/2018 1132 Last data filed at 06/20/2018 0900 Gross per 24 hour  Intake 360 ml  Output 375 ml  Net -15 ml   Filed Weights   06/15/18 0521 06/19/18 1253 06/19/18 2007  Weight: 87.1 kg (192 lb 0.3 oz) 93.4 kg (206 lb) 93.4 kg (206 lb)    Exam: Patient is examined daily including today on 06/20/2018, exams remain the same as of  yesterday except that has changed    General: today (on 7/2), for the first time, she is AAOX3  Cardiovascular: RRR  Respiratory: CTABL  Abdomen: mild epigastric tenderness, Soft/ND, positive BS, well healed prior midline surgical scar    Musculoskeletal: No Edema  Neuro: aaox3  Data Reviewed: Basic Metabolic Panel: Recent Labs  Lab 06/14/18 0417 06/14/18 1813 06/15/18 0741 06/15/18 1940 06/17/18 0810 06/18/18 0649 06/19/18 0529 06/19/18 1835 06/20/18 0651  NA  --  137 139 136 137 134* 134*  --  134*  K  --  3.7 4.4 4.9 3.2* 3.2* 3.0*  --  3.8  CL  --  108 109 108 107 107 103  --  106  CO2  --  23 20* 20* 21* 23 24  --  21*  GLUCOSE  --  129* 129* 134* 145* 119* 127*  --  122*  BUN  --  <5* <5* <5* <5* <5* 7  --  6  CREATININE  --  1.00 0.93 0.98 0.93 1.01* 1.10*  --  1.09*  CALCIUM 7.4*  7.2* 7.8* 7.9* 7.9* 7.9* 7.7* 7.6*  --  7.9*  MG 1.8  --  2.2  --  1.9 1.8  --  2.1  --   PHOS  --  1.1* 1.7* 2.6  --   --  2.4*  --  2.4*   Liver Function Tests: Recent Labs  Lab 06/14/18 0415 06/14/18 1813 06/15/18 0741 06/15/18 1940 06/18/18 0649  AST  --   --   --   --  15  ALT  --   --   --   --  11  ALKPHOS  --   --   --   --  77  BILITOT  --   --   --   --  0.5  PROT  --   --   --   --  4.7*  ALBUMIN 2.3* 2.4* 2.6* 2.6* 2.1*   No results for input(s): LIPASE, AMYLASE in the last 168 hours. Recent Labs  Lab 06/17/18 0810  AMMONIA 43*   CBC: Recent Labs  Lab 06/17/18 0810 06/19/18 1835  WBC 10.7* 6.5  HGB 9.7* 9.3*  HCT 28.4* 27.9*  MCV 105.2* 111.2*  PLT 78* 87*   Cardiac Enzymes:   No results for input(s): CKTOTAL, CKMB, CKMBINDEX, TROPONINI in the last 168 hours. BNP (last 3 results) Recent Labs    06/11/18 2114  BNP 226.2*    ProBNP (last 3 results) No results for input(s): PROBNP in the last 8760 hours.  CBG: Recent Labs  Lab 06/19/18 1714 06/19/18 2007 06/20/18 0006 06/20/18 0415 06/20/18 0726  GLUCAP 121* 113* 146* 112* 132*     Recent Results (from the past 240 hour(s))  Urine culture     Status: None   Collection Time: 06/11/18 11:37 AM  Result Value Ref Range Status   Specimen Description   Final    URINE, CLEAN CATCH Performed at [facility], [address], [place], [place] [address]    Special Requests   Final    NONE Performed at [facility], [address] [place], [place] [address]    Culture   Final    NO GROWTH Performed at [facility], [place] [address], [place], [place] [address]    Report Status 06/13/2018 FINAL  Final  Blood Culture (routine x 2)     Status: None   Collection Time: 06/11/18 11:41 AM  Result Value Ref Range Status   Specimen Description BLOOD LEFT ARM  Final   Special Requests   Final    BOTTLES DRAWN AEROBIC ONLY Blood Culture adequate volume   Culture   Final    NO GROWTH 5 DAYS Performed at [facility], [address] [place], [place] [address]    Report Status 06/16/2018 FINAL  Final  Blood Culture (routine x 2)     Status: None   Collection Time: 06/11/18 12:36 PM  Result Value Ref Range Status   Specimen Description BLOOD RIGHT HAND  Final   Special Requests   Final    BOTTLES DRAWN AEROBIC AND ANAEROBIC Blood Culture adequate volume   Culture   Final    NO GROWTH 5 DAYS Performed at [facility], [address], [place],  [address]    Report Status 06/16/2018 FINAL  Final  MRSA PCR Screening     Status: None   Collection Time: 06/11/18  6:34 PM  Result Value Ref Range Status   MRSA by PCR NEGATIVE NEGATIVE Final    Comment:  The GeneXpert MRSA Assay (FDA approved for NASAL specimens only), is one component of a comprehensive MRSA colonization surveillance program. It is not intended to diagnose MRSA infection nor to guide or monitor treatment for MRSA infections. Performed at [facility], [place] [address], [place], [place] [address]   Culture, blood (routine x 2)     Status: None   Collection Time: 06/11/18  7:40 PM   Result Value Ref Range Status   Specimen Description BLOOD RIGHT HAND  Final   Special Requests   Final    BOTTLES DRAWN AEROBIC ONLY Blood Culture adequate volume   Culture   Final    NO GROWTH 5 DAYS Performed at [facility], [place] [address], [place], [place] [address]    Report Status 06/16/2018 FINAL  Final  Culture, blood (routine x 2)     Status: None   Collection Time: 06/11/18  7:45 PM  Result Value Ref Range Status   Specimen Description BLOOD RIGHT ANTECUBITAL  Final   Special Requests   Final    BOTTLES DRAWN AEROBIC ONLY Blood Culture adequate volume   Culture   Final    NO GROWTH 5 DAYS Performed at [facility], [place] [address], [place], [place] [address]    Report Status 06/16/2018 FINAL  Final  Culture, respiratory (tracheal aspirate)     Status: None   Collection Time: 06/11/18 10:57 PM  Result Value Ref Range Status   Specimen Description TRACHEAL ASPIRATE  Final   Special Requests NONE  Final   Gram Stain   Final    WBC PRESENT, PREDOMINANTLY PMN NO ORGANISMS SEEN FEW GRAM POSITIVE RODS FEW YEAST    Culture   Final    Consistent with normal respiratory flora. Performed at [facility], [place] [address], [place], [place] [address]    Report Status 06/14/2018 FINAL  Final  C Difficile Quick Screen w PCR reflex     Status: None   Collection Time: 06/13/18  4:19 PM  Result Value Ref Range Status   C Diff antigen NEGATIVE NEGATIVE Final   C Diff toxin NEGATIVE NEGATIVE Final   C Diff interpretation No C. difficile detected.  Final    Comment: Performed at [facility], [place] [address], [place], [place] [address]  OVA + PARASITE EXAM     Status: None   Collection Time: 06/13/18  7:12 PM  Result Value Ref Range Status   OVA + PARASITE EXAM Final report  Final    Comment: (NOTE) These results were obtained using wet preparation(s) and trichrome stained smear. This test does not include testing for Cryptosporidium parvum, Cyclospora,  or Microsporidia. Performed At: [facility] [place], [place] [id number] Elwanda Brooklyn R MD [id number]    Source of Sample STOOL  Final  Calprotectin, Fecal     Status: None   Collection Time: 06/13/18  7:12 PM  Result Value Ref Range Status   Calprotectin, Fecal 39 0 - 120 ug/g Final    Comment: (NOTE) Concentration     Interpretation   Follow-Up <16 - 50 ug/g     Normal           None >50 -120 ug/g     Borderline       Re-evaluate in 4-6 weeks    >120 ug/g     Abnormal         Repeat as clinically  indicated Performed At: [facility] [place], [place] [id number] Rush Farmer MD [id number]   Gastrointestinal Panel by PCR , Stool     Status: None   Collection Time: 06/15/18 12:43 PM  Result Value Ref Range Status   Campylobacter species NOT DETECTED NOT DETECTED Final   Plesimonas shigelloides NOT DETECTED NOT DETECTED Final   Salmonella species NOT DETECTED NOT DETECTED Final   Yersinia enterocolitica NOT DETECTED NOT DETECTED Final   Vibrio species NOT DETECTED NOT DETECTED Final   Vibrio cholerae NOT DETECTED NOT DETECTED Final   Enteroaggregative E coli (EAEC) NOT DETECTED NOT DETECTED Final   Enteropathogenic E coli (EPEC) NOT DETECTED NOT DETECTED Final   Enterotoxigenic E coli (ETEC) NOT DETECTED NOT DETECTED Final   Shiga like toxin producing E coli (STEC) NOT DETECTED NOT DETECTED Final   Shigella/Enteroinvasive E coli (EIEC) NOT DETECTED NOT DETECTED Final   Cryptosporidium NOT DETECTED NOT DETECTED Final   Cyclospora cayetanensis NOT DETECTED NOT DETECTED Final   Entamoeba histolytica NOT DETECTED NOT DETECTED Final   Giardia lamblia NOT DETECTED NOT DETECTED Final   Adenovirus F40/41 NOT DETECTED NOT DETECTED Final   Astrovirus NOT DETECTED NOT DETECTED Final   Norovirus GI/GII NOT DETECTED NOT DETECTED Final   Rotavirus A NOT DETECTED NOT DETECTED Final   Sapovirus (I, II, IV,  and V) NOT DETECTED NOT DETECTED Final    Comment: Performed at [facility], [address], [place], [place] [address]     Studies: No results found.  Scheduled Meds: . chlorhexidine  15 mL Mouth Rinse BID  . cholecalciferol  1,000 Units Oral Daily  . diphenoxylate-atropine  1 tablet Oral QID  . enoxaparin (LOVENOX) injection  40 mg Subcutaneous Q24H  . famotidine  20 mg Oral BID  . feeding supplement  1 Container Oral TID BM  . fludrocortisone  0.1 mg Oral Daily  . folic acid  1 mg Per Tube Daily  . insulin aspart  0-15 Units Subcutaneous Q4H  . levothyroxine  100 mcg Oral QAC breakfast  . loperamide  4 mg Oral BID  . mouth rinse  15 mL Mouth Rinse q12n4p  . metroNIDAZOLE  500 mg Oral Q8H  . multivitamin with minerals  1 tablet Oral Daily  . tamsulosin  0.4 mg Oral Daily  . thiamine  100 mg Oral Daily  . vitamin B-12  250 mcg Oral Daily    Continuous Infusions: . sodium chloride 250 mL (06/15/18 2307)     Time spent: 69mns I have personally reviewed and interpreted on  06/20/2018 daily labs, tele strips, imagings as discussed above under date review session and assessment and plans.  I reviewed all nursing notes, pharmacy notes, consultant notes,  vitals, pertinent old records  I have discussed plan of care as described above with RN , patient on 06/20/2018   FFlorencia ReasonsMD, PhD  [facility] Pager [phone] If 7PM-7AM, please contact night-coverage at [email or web address], password [email or web address]7/01/2018, 11:32 AM  LOS: 9 days

## 2018-06-20 NOTE — Progress Notes (Signed)
Colon biopsies unrevealing - no colitis Cause of diarrhea not found so far In hospital - no letter or recall

## 2018-06-20 NOTE — Progress Notes (Signed)
Physical Therapy Treatment Patient Details Name: Deborah Russo MRN: [id number] DOB: Aug 29, 1961 Today's Date: 06/20/2018    History of Present Illness Deborah N Conawayis an 57 y.o.female. She was admitted June 23 for altered mental status thought to be due to polysubstance abuse. Had a similar presentation one year ago. Was also found to be hypotensive.Was intubated for airway protection and received fluid resuscitation, extubated June 24;Reports long history of abdominal pain treated with narcotics, persistent nausea vomiting and profuse diarrhea.  has a past medical history of Chronic abdominal pain, Chronic migraine, Chronic pain, Hypothyroidism, Nausea, vomiting, and diarrhea, Pain management, Sleep apnea, and Stroke ([place]).    PT Comments    Continuing work on functional mobility and activity tolerance;  Pt very agreeable to progressive amb today, however reported dizziness shortly after we began walking; assisted her back to bed; Consider orthostatics next session to rule out/in postural hypotension   Follow Up Recommendations  SNF     Equipment Recommendations  Rolling walker with 5" wheels;3in1 (PT)    Recommendations for Other Services       Precautions / Restrictions Precautions Precautions: Fall Precaution Comments: Rectal tube    Mobility  Bed Mobility Overal bed mobility: Needs Assistance Bed Mobility: Supine to Sit;Sit to Supine     Supine to sit: Mod assist Sit to supine: Mod assist   General bed mobility comments: Mod handheld assist to elevate trunk to sit; mod assist to help LEs back into bed  Transfers Overall transfer level: Needs assistance Equipment used: Rolling walker (2 wheeled) Transfers: Sit to/from Stand Sit to Stand: Mod assist         General transfer comment: Light mod assist to power up; cues for hand placement; opted to sit back to bed when pt reported dizziness in standing, decr control of descent to  sit  Ambulation/Gait Ambulation/Gait assistance: Min assist Gait Distance (Feet): 5 Feet Assistive device: Rolling walker (2 wheeled) Gait Pattern/deviations: Decreased step length - right;Decreased step length - left;Trunk flexed     General Gait Details: Cues to self-monitor for activity tolernace; reported dizziness, talking less shortly after we began wlaking; so backed up to the bed to sit down   Stairs             Wheelchair Mobility    Modified Rankin (Stroke Patients Only)       Balance             Standing balance-Leahy Scale: Poor Standing balance comment: Dependent on UE support                            Cognition Arousal/Alertness: Awake/alert Behavior During Therapy: WFL for tasks assessed/performed Overall Cognitive Status: Within Functional Limits for tasks assessed                                        Exercises      General Comments General comments (skin integrity, edema, etc.): Shortly after we began walking, pt reported dizziness, and was talking less; opted to sit back down to bed; BP sitting 105/55; Overall she had a look of general malaise, so assisted her back to laying down      Pertinent Vitals/Pain Pain Assessment: Faces Faces Pain Scale: Hurts even more Pain Location: Discomfort around anal opening Pain Descriptors / Indicators: Grimacing;Guarding;Burning Pain Intervention(s): Monitored during session  Home Living                      Prior Function            PT Goals (current goals can now be found in the care plan section) Acute Rehab PT Goals Patient Stated Goal: Wants to get stronger at rehab and then go home PT Goal Formulation: With patient Time For Goal Achievement: 07/02/18 Potential to Achieve Goals: Good Progress towards PT goals: Progressing toward goals(very slowly)    Frequency    Min 2X/week      PT Plan Current plan remains appropriate     Co-evaluation              AM-PAC PT "6 Clicks" Daily Activity  Outcome Measure  Difficulty turning over in bed (including adjusting bedclothes, sheets and blankets)?: A Little Difficulty moving from lying on back to sitting on the side of the bed? : Unable Difficulty sitting down on and standing up from a chair with arms (e.g., wheelchair, bedside commode, etc,.)?: Unable Help needed moving to and from a bed to chair (including a wheelchair)?: A Lot Help needed walking in hospital room?: A Lot Help needed climbing 3-5 steps with a railing? : Total 6 Click Score: 10    End of Session Equipment Utilized During Treatment: Gait belt Activity Tolerance: Patient limited by fatigue Patient left: in bed;with call bell/phone within reach Nurse Communication: Mobility status;Patient requests pain meds PT Visit Diagnosis: Muscle weakness (generalized) (M62.81);Unsteadiness on feet (R26.81)     Time: 6063-0160 PT Time Calculation (min) (ACUTE ONLY): 34 min  Charges:  $Gait Training: 8-22 mins $Therapeutic Activity: 8-22 mins                    G Codes:       Roney Marion, PT  Acute Rehabilitation Services Pager [phone] Office [place] 06/20/2018, 4:58 PM

## 2018-06-20 NOTE — Progress Notes (Signed)
[facility] Progress Note    Since last GI note: Mucosal biopsies from left colon during flex sig last week were essentially normal; no microscopic colitis.  I added 2 imodium twice daily to her 1 lomotil QID regimen yesterday.  Diet was changed to regular.  Objective: Vital signs in last 24 hours: Temp:  [97.9 F (36.6 C)-98.6 F (37 C)] 98.6 F (37 C) (07/02 0415) Pulse Rate:  [72-83] 75 (07/02 0415) Resp:  [14-20] 17 (07/02 0415) BP: (85-105)/(57-61) 92/59 (07/02 0415) SpO2:  [93 %-100 %] 93 % (07/02 0415) Weight:  [206 lb (93.4 kg)] 206 lb (93.4 kg) (07/01 2007) Last BM Date: 06/19/18 General: alert and oriented times 3 Heart: regular rate and rythm Abdomen: soft, non-tender, non-distended, normal bowel sounds No stool output volumes have been charted in 1-2 days except for "amount, medium" Watery brown stool in rectiseal bag.  Lab Results: Recent Labs    06/19/18 1835  WBC 6.5  HGB 9.3*  PLT 87*  MCV 111.2*   Recent Labs    06/18/18 0649 06/19/18 0529 06/20/18 0651  NA 134* 134* 134*  K 3.2* 3.0* 3.8  CL 107 103 106  CO2 23 24 21*  GLUCOSE 119* 127* 122*  BUN <5* 7 6  CREATININE 1.01* 1.10* 1.09*  CALCIUM 7.7* 7.6* 7.9*   Recent Labs    06/18/18 0649  PROT 4.7*  ALBUMIN 2.1*  AST 15  ALT 11  ALKPHOS 77  BILITOT 0.5    Medications: Scheduled Meds: . chlorhexidine  15 mL Mouth Rinse BID  . cholecalciferol  1,000 Units Oral Daily  . diphenoxylate-atropine  1 tablet Oral QID  . enoxaparin (LOVENOX) injection  40 mg Subcutaneous Q24H  . famotidine  20 mg Oral BID  . feeding supplement  1 Container Oral TID BM  . fludrocortisone  0.1 mg Oral Daily  . folic acid  1 mg Per Tube Daily  . insulin aspart  0-15 Units Subcutaneous Q4H  . levothyroxine  100 mcg Oral QAC breakfast  . loperamide  4 mg Oral BID  . mouth rinse  15 mL Mouth Rinse q12n4p  . multivitamin with minerals  1 tablet Oral Daily  . pantoprazole  40 mg Oral BID  .  tamsulosin  0.4 mg Oral Daily  . thiamine  100 mg Oral Daily  . vitamin B-12  250 mcg Oral Daily   Continuous Infusions: . sodium chloride 250 mL (06/15/18 2307)   PRN Meds:.sodium chloride, oxyCODONE-acetaminophen, promethazine    Assessment/Plan: 57 y.o. female with several weeks of diarrhea without clear etiology at this point.  Very impressive colon edema, thickening on CT however flex sig was fairly normal.   So far testing has revealed that she probably does not have infectious diarrhea, Celiac sprue, microscopic colitis, IBD, carcinoid syndrome (24 hour 5HIAA was normal), gastrinoma (serum gastrin level normal).  Need to check total IgA level (can be chronically low, leading to false negative celiac sprue testing and also would put her at risk for giardia).  Protonix #2 side effect is diarrhea, will stop and use H2 blockers for acid control  Not sure why she is on tamsulosin (usually a BPH medicine).  I don't see that it causes diarrhea but does she really need to be on this?  She doesn't have a gallbladder, perhaps this is cholerheic diarrhea? GB was removed so remotely and she tells me the diarrhea has been going on for 2-4 weeks, I think this is less likely her issue.  Really need to get accurate daily and per shift stool volumes recorded in Epic.      I'm going to start her on empiric flagyl 528m PO TID for 5 days.   DMilus Banister MD  06/20/2018, 8:16 AM [facility] Pager ([phone]

## 2018-06-20 NOTE — Clinical Social Work Placement (Signed)
   CLINICAL SOCIAL WORK PLACEMENT  NOTE  Date:  06/20/2018  Patient Details  Name: Deborah Russo MRN: [id number] Date of Birth: 1961/02/28  Clinical Social Work is seeking post-discharge placement for this patient at the [place] level of care (*CSW will initial, date and re-position this form in  chart as items are completed):  Yes   Patient/family provided with [place] Work Department's list of facilities offering this level of care within the geographic area requested by the patient (or if unable, by the patient's family).  Yes   Patient/family informed of their freedom to choose among providers that offer the needed level of care, that participate in Medicare, Medicaid or managed care program needed by the patient, have an available bed and are willing to accept the patient.  Yes   Patient/family informed of [facility]'s ownership interest in [facility] and [facility], as well as of the fact that they are under no obligation to receive care at these facilities.  PASRR submitted to EDS on 06/20/18     PASRR number received on 06/20/18     Existing PASRR number confirmed on       FL2 transmitted to all facilities in geographic area requested by pt/family on 06/20/18     FL2 transmitted to all facilities within larger geographic area on       Patient informed that his/her managed care company has contracts with or will negotiate with certain facilities, including the following:            Patient/family informed of bed offers received.  Patient chooses bed at       Physician recommends and patient chooses bed at      Patient to be transferred to   on  .  Patient to be transferred to facility by       Patient family notified on   of transfer.  Name of family member notified:        PHYSICIAN Please sign FL2     Additional Comment:    _______________________________________________ Candie Chroman, LCSW 06/20/2018,  1:29 PM

## 2018-06-20 NOTE — Clinical Social Work Note (Signed)
Clinical Social Work Assessment  Patient Details  Name: Deborah Russo MRN: [id number] Date of Birth: 1961/09/14  Date of referral:  06/20/18               Reason for consult:  Facility Placement, Discharge Planning                Permission sought to share information with:  Chartered certified accountant granted to share information::  Yes, Verbal Permission Granted  Name::        Agency::  SNF's  Relationship::     Contact Information:     Housing/Transportation Living arrangements for the past 2 months:  Single Family Home Source of Information:  Patient, Medical Team Patient Interpreter Needed:  None Criminal Activity/Legal Involvement Pertinent to Current Situation/Hospitalization:  No - Comment as needed Significant Relationships:  Adult Children, Siblings, Other Family Members Lives with:  Adult Children, Siblings Do you feel safe going back to the place where you live?  Yes Need for family participation in patient care:  Yes (Comment)  Care giving concerns:  PT recommending SNF once medically stable for discharge.   Social Worker assessment / plan:  CSW met with patient. Patient appears oriented. No supports at bedside. CSW introduced role and explained that PT recommendations would be discussed. Patient not sure if she wants SNF or not but is willing to consider it. She confirmed she lives in [place]. She will review options and CSW will follow up with bed offers. No further concerns. CSW encouraged patient to contact CSW as needed. CSW will continue to follow patient for support and facilitate discharge to SNF, if agreeable, once medically stable.  Employment status:  Disabled (Comment on whether or not currently receiving Disability) Insurance information:  Medicare PT Recommendations:  [place] / Referral to community resources:  [place]  Patient/Family's Response to care:  Patient willing to  consider SNF. Patient's family supportive and involved in patient's care. Patient appreciated social work intervention.  Patient/Family's Understanding of and Emotional Response to Diagnosis, Current Treatment, and Prognosis:  Patient has a good understanding of the reason for CSW consult. Patient appears pleased with hospital care.  Emotional Assessment Appearance:  Appears stated age Attitude/Demeanor/Rapport:  Engaged, Gracious Affect (typically observed):  Appropriate, Calm, Pleasant Orientation:  Oriented to Self, Oriented to Place, Oriented to  Time, Oriented to Situation Alcohol / Substance use:  Tobacco Use Psych involvement (Current and /or in the community):  No (Comment)  Discharge Needs  Concerns to be addressed:  Care Coordination Readmission within the last 30 days:  No Current discharge risk:  Dependent with Mobility Barriers to Discharge:  Continued Medical Work up   Candie Chroman, LCSW 06/20/2018, 1:27 PM

## 2018-06-21 DIAGNOSIS — K219 Gastro-esophageal reflux disease without esophagitis: Secondary | ICD-10-CM

## 2018-06-21 DIAGNOSIS — K760 Fatty (change of) liver, not elsewhere classified: Secondary | ICD-10-CM

## 2018-06-21 DIAGNOSIS — I959 Hypotension, unspecified: Secondary | ICD-10-CM

## 2018-06-21 LAB — CBC
HEMATOCRIT: 22.5 % — AB (ref 36.0–46.0)
Hemoglobin: 7.6 g/dL — ABNORMAL LOW (ref 12.0–15.0)
MCH: 36.9 pg — ABNORMAL HIGH (ref 26.0–34.0)
MCHC: 33.8 g/dL (ref 30.0–36.0)
MCV: 109.2 fL — AB (ref 78.0–100.0)
PLATELETS: 113 10*3/uL — AB (ref 150–400)
RBC: 2.06 MIL/uL — ABNORMAL LOW (ref 3.87–5.11)
RDW: 16.5 % — AB (ref 11.5–15.5)
WBC: 4.4 10*3/uL (ref 4.0–10.5)

## 2018-06-21 LAB — CBC WITH DIFFERENTIAL/PLATELET
Abs Immature Granulocytes: 0 10*3/uL (ref 0.0–0.1)
Basophils Absolute: 0 10*3/uL (ref 0.0–0.1)
Basophils Relative: 0 %
EOS PCT: 0 %
Eosinophils Absolute: 0 10*3/uL (ref 0.0–0.7)
HEMATOCRIT: 24.1 % — AB (ref 36.0–46.0)
HEMOGLOBIN: 8.1 g/dL — AB (ref 12.0–15.0)
Immature Granulocytes: 1 %
LYMPHS ABS: 2.1 10*3/uL (ref 0.7–4.0)
LYMPHS PCT: 42 %
MCH: 36.2 pg — AB (ref 26.0–34.0)
MCHC: 33.6 g/dL (ref 30.0–36.0)
MCV: 107.6 fL — ABNORMAL HIGH (ref 78.0–100.0)
MONO ABS: 0.2 10*3/uL (ref 0.1–1.0)
Monocytes Relative: 5 %
Neutro Abs: 2.6 10*3/uL (ref 1.7–7.7)
Neutrophils Relative %: 52 %
Platelets: 114 10*3/uL — ABNORMAL LOW (ref 150–400)
RBC: 2.24 MIL/uL — ABNORMAL LOW (ref 3.87–5.11)
RDW: 16.2 % — AB (ref 11.5–15.5)
WBC: 5 10*3/uL (ref 4.0–10.5)

## 2018-06-21 LAB — BASIC METABOLIC PANEL
Anion gap: 6 (ref 5–15)
BUN: 7 mg/dL (ref 6–20)
CHLORIDE: 106 mmol/L (ref 98–111)
CO2: 23 mmol/L (ref 22–32)
CREATININE: 1.01 mg/dL — AB (ref 0.44–1.00)
Calcium: 7.6 mg/dL — ABNORMAL LOW (ref 8.9–10.3)
GFR calc Af Amer: >60 mL/min (ref >60)
GFR calc non Af Amer: >60 mL/min (ref >60)
GLUCOSE: 130 mg/dL — AB (ref 70–99)
POTASSIUM: 2.8 mmol/L — AB (ref 3.5–5.1)
Sodium: 135 mmol/L (ref 135–145)

## 2018-06-21 LAB — GLUCOSE, CAPILLARY
GLUCOSE-CAPILLARY: 125 mg/dL — AB (ref 70–99)
GLUCOSE-CAPILLARY: 143 mg/dL — AB (ref 70–99)
GLUCOSE-CAPILLARY: 119 mg/dL — AB (ref 70–99)
Glucose-Capillary: 114 mg/dL — ABNORMAL HIGH (ref 70–99)

## 2018-06-21 LAB — MAGNESIUM
Magnesium: 1.9 mg/dL (ref 1.7–2.4)
Magnesium: 1.8 mg/dL (ref 1.7–2.4)

## 2018-06-21 LAB — PHOSPHORUS: Phosphorus: 2.4 mg/dL — ABNORMAL LOW (ref 2.5–4.6)

## 2018-06-21 MED ORDER — POTASSIUM CHLORIDE CRYS ER 20 MEQ PO TBCR
40.0000 meq | EXTENDED_RELEASE_TABLET | Freq: Two times a day (BID) | ORAL | Status: AC
  Administered 2018-06-21 (×2): 40 meq via ORAL
  Filled 2018-06-21 (×2): qty 2

## 2018-06-21 MED ORDER — POTASSIUM CHLORIDE 10 MEQ/100ML IV SOLN
10.0000 meq | INTRAVENOUS | Status: AC
  Administered 2018-06-21 (×3): 10 meq via INTRAVENOUS
  Filled 2018-06-21 (×3): qty 100

## 2018-06-21 MED ORDER — K PHOS MONO-SOD PHOS DI & MONO 155-852-130 MG PO TABS
500.0000 mg | ORAL_TABLET | Freq: Two times a day (BID) | ORAL | Status: AC
  Administered 2018-06-21 (×2): 500 mg via ORAL
  Filled 2018-06-21 (×2): qty 2

## 2018-06-21 MED ORDER — CHOLESTYRAMINE 4 G PO PACK
4.0000 g | PACK | Freq: Two times a day (BID) | ORAL | Status: DC
  Administered 2018-06-21 – 2018-06-23 (×6): 4 g via ORAL
  Filled 2018-06-21 (×7): qty 1

## 2018-06-21 NOTE — Progress Notes (Signed)
PROGRESS NOTE    MARDIE KELLEN  SAY:[id number] DOB: May 21, 1961 DOA: 06/11/2018 PCP: Eustaquio Maize, MD   Brief Narrative:  Roxine Caddy Conawayis an 57 y.o.female. She was admitted June 23 for altered mental status thought to be due to polysubstance abuse. Had a similar presentation one year ago. "Discharge summary from 1 year ago was reviewed when she presented with altered mental status. There was diagnosed with acute toxic encephalopathy thought to be due to polypharmacy with unintentional overdose. However it was also thought that she may have had a stroke at that time she refused further evaluation."  Per ED initial intake "From home- sister moved out a week ago, Called EMS for SOB, was sitting on front porch of filthy home per EMS"  Was also found to be hypotensive.Was intubated for airway protection and received fluid resuscitation. Reports long history of abdominal pain treated with narcotics, persistent nausea vomiting and profuse diarrhea. Reportedly had not been eating or drinking normally and taking none of her medication   She  is extubated on June 24 and off pressors on 6/26 ,she is  transferred to hospitalist service June 28. She underwent sigmoidoscope on June 28, continue have ab pain and loose stool ,gi following and making recommendations  Assessment & Plan:   Active Problems:   Hypotension   Colitis   Cirrhosis of liver without ascites (HCC)  Pancolitis with profuse diarrhea, currently have a rectal tube , she c/o epigastric pain which is chronic by patient's report :  -Patient reports chronic Abdominal pain/ chronic diarrhea, she reports she is on pain meds for chronic abdominal pain -she reports has been having epigastric pain since she give birth to her daughter, epigastric pain get better with putting pressure on.  -GI Pathogen pcr panel negative -protonix stopped per GI due to concerns of diarrhea side effect, she is on  pepecid bid  -s/p  sigmoidoscope on 6/28 -GI following,  will follow gi recommendations -[name] commending starting cholestyramine 4 g twice daily in addition to Imodium and Lomotil and also continuing a 5-day course of metronidazole  Electrolytes abnormalities:  Hypokalemia: k on presentation less than 2, improving, but remain low, continue to replace to keep k>4; K+ now 2.8 -Replete  -Continue to Monitor and repeat CMP in AM   Hypomagnesemia: mag 1.3 on presentation, replaced, improving, keep replace to keep mag >2; Mag now 1.9   Hypocalcemia: calcium on presentation was 5.3, replaced much improved, calcium 7.6, albumin 2.6 -Repeat CMP in AM   Hypophosphatemia: phos on presentation is less than 1, replaced and now is 2.4 -Replete again   Encephalopathy, improved  -Ct head no acute findings, 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 ( perc chart review she was hospitalized for left mca stroke in 03/2017) -C/w supportive care ,  Improving, she is AAOX3 since 7/2 -start PT, she reports she uses a walker for many years at baseline -PT/OT Recommending SNF  Macrocytic Anemia -mcv 107 on presentation -hgb around 9-10 ( hgb a year ago around 13-14) -Hemoglobin/hematocrit went from 9.3/27.9 and is now down to 8.1/24.1 -Continue to monitor for signs and symptoms of bleeding -Check Anemia Panel  -Repeat CBC in a.m.  Hypothyroidism:  -TSH on presentation was 50 -She is started on Synthroid 100 mcg po Daily ( per chart review she has a long history of noncompliance with meds) and will continue   Adrenal insufficiency:  From long term opioids use? -Random cortisol 13, no increase after cosyntropin administration. -Ct ab "  bilateral adrenal glands are normal in appearance." -Started on Fludrocortisone 0.1 mg daily  Low B12 -Started on supplementation and will continue 250 mcg po Daily   Thrombocytopenia -Platelet Count on presentation was 29, ( a year ago was 117-146 per chart reveiw) -CT  ab showed "Hepatic steatosis with evidence of cirrhosis."  With trace ascites, spleen unremarkable -Platelet Count improving and is now 114  Acute Urinary Retention She has a foley placed on 6/22 due to urinary retention, foley removed on 6/28, repeat  bladder scan on 6/29, again urinary retention of 643cc, reinsert foley -start trial of flomax, start Physical therapy,  -Plan to have voiding trial prior to discharge, if she fail again, may need to d/c to snf with foley and urology follow up.  Aortic atherosclerosis -Including high-grade stenosis or occlusion of the left common iliac artery.with distal reconstitution of flow related to collateral Pathways On CT Adominal and Pelvis   Obesity -Body mass index is 37.68 kg/m.  -Weight Loss Counseling given  Tobacco Abuse/Smoker -Smoking cessation education provided .  Chronic pain (epigastric pain)  -[facility] -She reports she has not followed up with pain clinic for several months if not longer.  -She reports she run of pain meds a several weeks ago, not sure if story reliable.  Generalized Weakness/Deconditioning -PT/OT recommending SNF  DVT prophylaxis: Enoxaparin 40 mg sq q24h Code Status: FULL CODE Family Communication: No family present at bedside  Disposition Plan: SNF once cleared by Gastroenterology as patient is agreeable   Consultants:   PCCM Transfer  Gastroenterology    Procedures:  Extubation 6/24 Sigmoidoscopy 6/28   Antimicrobials:  Anti-infectives (From admission, onward)   Start     Dose/Rate Route Frequency Ordered Stop   06/20/18 0900  metroNIDAZOLE (FLAGYL) tablet 500 mg     500 mg Oral Every 8 hours 06/20/18 0854 06/25/18 0559   06/12/18 0000  vancomycin (VANCOCIN) IVPB 1000 mg/200 mL premix  Status:  Discontinued     1,000 mg 200 mL/hr over 60 Minutes Intravenous Every 12 hours 06/11/18 1419 06/13/18 1021   06/11/18 2100  piperacillin-tazobactam (ZOSYN) IVPB  3.375 g  Status:  Discontinued     3.375 g 12.5 mL/hr over 240 Minutes Intravenous Every 8 hours 06/11/18 1419 06/15/18 0626   06/11/18 1300  vancomycin (VANCOCIN) 1,500 mg in sodium chloride 0.9 % 500 mL IVPB     1,500 mg 250 mL/hr over 120 Minutes Intravenous  Once 06/11/18 1143 06/11/18 1444   06/11/18 1145  piperacillin-tazobactam (ZOSYN) IVPB 3.375 g     3.375 g 100 mL/hr over 30 Minutes Intravenous  Once 06/11/18 1137 06/11/18 1230   06/11/18 1145  vancomycin (VANCOCIN) IVPB 1000 mg/200 mL premix  Status:  Discontinued     1,000 mg 200 mL/hr over 60 Minutes Intravenous  Once 06/11/18 1137 06/11/18 1142     Subjective: Seen and Examined was still having profuse diarrhea.  States she is having epigastric pain as well.  No lightheadedness or dizziness.  Still has rectal tube and states that is bothering her.  No other concerns or complaints at this time.  Objective: Vitals:   06/20/18 2040 06/21/18 0443 06/21/18 0855 06/21/18 1724  BP: (!) 93/54 94/60 (!) 107/56 (!) 103/53  Pulse: 78 79 79 77  Resp: 18 18 20 20   Temp: 98.4 F (36.9 C) 98.3 F (36.8 C) 97.7 F (36.5 C) 97.6 F (36.4 C)  TempSrc:   Oral Oral  SpO2: 91% 96% 98%  100%  Weight: 93.4 kg (206 lb 0.1 oz)     Height:        Intake/Output Summary (Last 24 hours) at 06/21/2018 1745 Last data filed at 06/21/2018 1741 Gross per 24 hour  Intake 870 ml  Output 1100 ml  Net -230 ml   Filed Weights   06/19/18 1253 06/19/18 2007 06/20/18 2040  Weight: 93.4 kg (206 lb) 93.4 kg (206 lb) 93.4 kg (206 lb 0.1 oz)   Examination: Physical Exam:  Constitutional: WN/WD obese NAD and appears calm and comfortable Eyes: Lids and conjunctivae normal, sclerae anicteric  ENMT: External Ears, Nose appear normal. Grossly normal hearing. Mucous membranes are moist. Posterior pharynx clear of any exudate or lesions. Normal dentition.  Neck: Appears normal, supple, no cervical masses, normal ROM, no appreciable thyromegaly Respiratory:  Diminished to auscultation bilaterally, no wheezing, rales, rhonchi or crackles. Normal respiratory effort and patient is not tachypenic. No accessory muscle use.  Cardiovascular: RRR, no murmurs / rubs / gallops. S1 and S2 auscultated. Trace extremity edema. 2+ pedal pulses. No carotid bruits.  Abdomen: Soft, Tender to palpate, Distended 2/2 to body habitus. No masses palpated. No appreciable hepatosplenomegaly. Bowel sounds positive x4 and hyperactive.  GU: Deferred. Has a foley catheter and a dignisheild in place.  Musculoskeletal: No clubbing / cyanosis of digits/nails. No joint deformity upper and lower extremities.  Skin: No rashes, lesions, ulcers on a limited skin eval. No induration; Warm and dry.  Neurologic: CN 2-12 grossly intact with no focal deficits. Romberg sign and cerebellar reflexes not assessed.  Psychiatric: Normal judgment and insight. Alert and oriented x 3. Normal mood and appropriate affect.   Data Reviewed: I have personally reviewed following labs and imaging studies  CBC: Recent Labs  Lab 06/17/18 0810 06/19/18 1835 06/21/18 0838  WBC 10.7* 6.5 5.0  NEUTROABS  --   --  2.6  HGB 9.7* 9.3* 8.1*  HCT 28.4* 27.9* 24.1*  MCV 105.2* 111.2* 107.6*  PLT 78* 87* 161*   Basic Metabolic Panel: Recent Labs  Lab 06/15/18 0741 06/15/18 1940 06/17/18 0810 06/18/18 0649 06/19/18 0529 06/19/18 1835 06/20/18 0651 06/21/18 0450 06/21/18 0838  NA 139 136 137 134* 134*  --  134* 135  --   K 4.4 4.9 3.2* 3.2* 3.0*  --  3.8 2.8*  --   CL 109 108 107 107 103  --  106 106  --   CO2 20* 20* 21* 23 24  --  21* 23  --   GLUCOSE 129* 134* 145* 119* 127*  --  122* 130*  --   BUN <5* <5* <5* <5* 7  --  6 7  --   CREATININE 0.93 0.98 0.93 1.01* 1.10*  --  1.09* 1.01*  --   CALCIUM 7.9* 7.9* 7.9* 7.7* 7.6*  --  7.9* 7.6*  --   MG 2.2  --  1.9 1.8  --  2.1  --   --  1.9  PHOS 1.7* 2.6  --   --  2.4*  --  2.4*  --  2.4*   GFR: Estimated Creatinine Clearance: 64.6 mL/min (A)  (by C-G formula based on SCr of 1.01 mg/dL (H)). Liver Function Tests: Recent Labs  Lab 06/14/18 1813 06/15/18 0741 06/15/18 1940 06/18/18 0649  AST  --   --   --  15  ALT  --   --   --  11  ALKPHOS  --   --   --  77  BILITOT  --   --   --  0.5  PROT  --   --   --  4.7*  ALBUMIN 2.4* 2.6* 2.6* 2.1*   No results for input(s): LIPASE, AMYLASE in the last 168 hours. Recent Labs  Lab 06/17/18 0810  AMMONIA 43*   Coagulation Profile: No results for input(s): INR, PROTIME in the last 168 hours. Cardiac Enzymes: No results for input(s): CKTOTAL, CKMB, CKMBINDEX, TROPONINI in the last 168 hours. BNP (last 3 results) No results for input(s): PROBNP in the last 8760 hours. HbA1C: Recent Labs    06/19/18 0529  HGBA1C 6.1*   CBG: Recent Labs  Lab 06/20/18 1607 06/20/18 2039 06/21/18 0736 06/21/18 1128 06/21/18 1724  GLUCAP 131* 136* 114* 125* 143*   Lipid Profile: No results for input(s): CHOL, HDL, LDLCALC, TRIG, CHOLHDL, LDLDIRECT in the last 72 hours. Thyroid Function Tests: No results for input(s): TSH, T4TOTAL, FREET4, T3FREE, THYROIDAB in the last 72 hours. Anemia Panel: No results for input(s): VITAMINB12, FOLATE, FERRITIN, TIBC, IRON, RETICCTPCT in the last 72 hours. Sepsis Labs: No results for input(s): PROCALCITON, LATICACIDVEN in the last 168 hours.  Recent Results (from the past 240 hour(s))  MRSA PCR Screening     Status: None   Collection Time: 06/11/18  6:34 PM  Result Value Ref Range Status   MRSA by PCR NEGATIVE NEGATIVE Final    Comment:        The GeneXpert MRSA Assay (FDA approved for NASAL specimens only), is one component of a comprehensive MRSA colonization surveillance program. It is not intended to diagnose MRSA infection nor to guide or monitor treatment for MRSA infections. Performed at [facility], [place] [address] [place], [place] [address]   Culture, blood (routine x 2)     Status: None   Collection Time: 06/11/18  7:40 PM    Result Value Ref Range Status   Specimen Description BLOOD RIGHT HAND  Final   Special Requests   Final    BOTTLES DRAWN AEROBIC ONLY Blood Culture adequate volume   Culture   Final    NO GROWTH 5 DAYS Performed at [facility], [place] [address] [place], [place] [address]    Report Status 06/16/2018 FINAL  Final  Culture, blood (routine x 2)     Status: None   Collection Time: 06/11/18  7:45 PM  Result Value Ref Range Status   Specimen Description BLOOD RIGHT ANTECUBITAL  Final   Special Requests   Final    BOTTLES DRAWN AEROBIC ONLY Blood Culture adequate volume   Culture   Final    NO GROWTH 5 DAYS Performed at [facility], [place] [address], [place], [place] [address]    Report Status 06/16/2018 FINAL  Final  Culture, respiratory (tracheal aspirate)     Status: None   Collection Time: 06/11/18 10:57 PM  Result Value Ref Range Status   Specimen Description TRACHEAL ASPIRATE  Final   Special Requests NONE  Final   Gram Stain   Final    WBC PRESENT, PREDOMINANTLY PMN NO ORGANISMS SEEN FEW GRAM POSITIVE RODS FEW YEAST    Culture   Final    Consistent with normal respiratory flora. Performed at [facility], [place] [address] [place], [place] [address]    Report Status 06/14/2018 FINAL  Final  C Difficile Quick Screen w PCR reflex     Status: None   Collection Time: 06/13/18  4:19 PM  Result Value Ref Range Status   C Diff antigen NEGATIVE NEGATIVE  Final   C Diff toxin NEGATIVE NEGATIVE Final   C Diff interpretation No C. difficile detected.  Final    Comment: Performed at [facility], [place] [address], [place], [place] [address]  OVA + PARASITE EXAM     Status: None   Collection Time: 06/13/18  7:12 PM  Result Value Ref Range Status   OVA + PARASITE EXAM Final report  Final    Comment: (NOTE) These results were obtained using wet preparation(s) and trichrome stained smear. This test does not include testing for Cryptosporidium parvum, Cyclospora,  or Microsporidia. Performed At: [place], [place] [id number] Elwanda Brooklyn R MD [id number]    Source of Sample STOOL  Final  Calprotectin, Fecal     Status: None   Collection Time: 06/13/18  7:12 PM  Result Value Ref Range Status   Calprotectin, Fecal 39 0 - 120 ug/g Final    Comment: (NOTE) Concentration     Interpretation   Follow-Up <16 - 50 ug/g     Normal           None >50 -120 ug/g     Borderline       Re-evaluate in 4-6 weeks    >120 ug/g     Abnormal         Repeat as clinically                                   indicated Performed At: [facility] [address] [place], [place] [id number] Rush Farmer MD [id number]   Gastrointestinal Panel by PCR , Stool     Status: None   Collection Time: 06/15/18 12:43 PM  Result Value Ref Range Status   Campylobacter species NOT DETECTED NOT DETECTED Final   Plesimonas shigelloides NOT DETECTED NOT DETECTED Final   Salmonella species NOT DETECTED NOT DETECTED Final   Yersinia enterocolitica NOT DETECTED NOT DETECTED Final   Vibrio species NOT DETECTED NOT DETECTED Final   Vibrio cholerae NOT DETECTED NOT DETECTED Final   Enteroaggregative E coli (EAEC) NOT DETECTED NOT DETECTED Final   Enteropathogenic E coli (EPEC) NOT DETECTED NOT DETECTED Final   Enterotoxigenic E coli (ETEC) NOT DETECTED NOT DETECTED Final   Shiga like toxin producing E coli (STEC) NOT DETECTED NOT DETECTED Final   Shigella/Enteroinvasive E coli (EIEC) NOT DETECTED NOT DETECTED Final   Cryptosporidium NOT DETECTED NOT DETECTED Final   Cyclospora cayetanensis NOT DETECTED NOT DETECTED Final   Entamoeba histolytica NOT DETECTED NOT DETECTED Final   Giardia lamblia NOT DETECTED NOT DETECTED Final   Adenovirus F40/41 NOT DETECTED NOT DETECTED Final   Astrovirus NOT DETECTED NOT DETECTED Final   Norovirus GI/GII NOT DETECTED NOT DETECTED Final   Rotavirus A NOT DETECTED NOT DETECTED Final   Sapovirus (I, II, IV,  and V) NOT DETECTED NOT DETECTED Final    Comment: Performed at [facility], [address] [place], [place] [address]    Radiology Studies: No results found.   Scheduled Meds: . chlorhexidine  15 mL Mouth Rinse BID  . cholecalciferol  1,000 Units Oral Daily  . cholestyramine  4 g Oral BID  . diphenoxylate-atropine  1 tablet Oral QID  . enoxaparin (LOVENOX) injection  40 mg Subcutaneous Q24H  . famotidine  20 mg Oral BID  . feeding supplement  1 Container Oral TID BM  . fludrocortisone  0.1 mg  Oral Daily  . folic acid  1 mg Per Tube Daily  . insulin aspart  0-15 Units Subcutaneous Q4H  . levothyroxine  100 mcg Oral QAC breakfast  . loperamide  4 mg Oral BID  . mouth rinse  15 mL Mouth Rinse q12n4p  . metroNIDAZOLE  500 mg Oral Q8H  . multivitamin with minerals  1 tablet Oral Daily  . phosphorus  500 mg Oral BID  . potassium chloride  40 mEq Oral BID  . tamsulosin  0.4 mg Oral Daily  . thiamine  100 mg Oral Daily  . vitamin B-12  250 mcg Oral Daily   Continuous Infusions: . sodium chloride 250 mL (06/21/18 0942)    LOS: 10 days   Kerney Elbe, DO [facility] Pager [phone]  If 7PM-7AM, please contact night-coverage [email or web address] Password [facility] 06/21/2018, 5:45 PM

## 2018-06-21 NOTE — Progress Notes (Addendum)
Daily Rounding Note  06/21/2018, 8:26 AM  LOS: 10 days   SUBJECTIVE:   Chief complaint: chronic epigastric pain, for years.  No nausea.  Appetite getting better.  Watery stools continue, Flexiseal in place. Stool volume not being measured.        OBJECTIVE:         Vital signs in last 24 hours:    Temp:  [98.3 F (36.8 C)-98.8 F (37.1 C)] 98.3 F (36.8 C) (07/03 0443) Pulse Rate:  [78-80] 79 (07/03 0443) Resp:  [18] 18 (07/03 0443) BP: (93-103)/(54-64) 94/60 (07/03 0443) SpO2:  [91 %-98 %] 96 % (07/03 0443) Weight:  [206 lb 0.1 oz (93.4 kg)] 206 lb 0.1 oz (93.4 kg) (07/02 2040) Last BM Date: 06/20/18 Filed Weights   06/19/18 1253 06/19/18 2007 06/20/18 2040  Weight: 206 lb (93.4 kg) 206 lb (93.4 kg) 206 lb 0.1 oz (93.4 kg)   General: looks chronically unwell, old for age.  alert   Heart: RRR Chest: clear bil. Raspy voice, no cough Abdomen: soft, NT.  Obese.  Active BS.  Light brown watery stool, 100 mL in flexi seal.    Extremities: non-pitting mild swelling in LE Derm:  Dry, covered with excessive areas of old skin that has not shed. Neuro/Psych:  Oriented x 3.  Calm, appropriate.     Lab Results: Recent Labs    06/19/18 1835  WBC 6.5  HGB 9.3*  HCT 27.9*  PLT 87*   BMET Recent Labs    06/19/18 0529 06/20/18 0651 06/21/18 0450  NA 134* 134* 135  K 3.0* 3.8 2.8*  CL 103 106 106  CO2 24 21* 23  GLUCOSE 127* 122* 130*  BUN 7 6 7   CREATININE 1.10* 1.09* 1.01*  CALCIUM 7.6* 7.9* 7.6*    Scheduled Meds: . chlorhexidine  15 mL Mouth Rinse BID  . cholecalciferol  1,000 Units Oral Daily  . diphenoxylate-atropine  1 tablet Oral QID  . enoxaparin (LOVENOX) injection  40 mg Subcutaneous Q24H  . famotidine  20 mg Oral BID  . feeding supplement  1 Container Oral TID BM  . fludrocortisone  0.1 mg Oral Daily  . folic acid  1 mg Per Tube Daily  . insulin aspart  0-15 Units Subcutaneous Q4H  .  levothyroxine  100 mcg Oral QAC breakfast  . loperamide  4 mg Oral BID  . mouth rinse  15 mL Mouth Rinse q12n4p  . metroNIDAZOLE  500 mg Oral Q8H  . multivitamin with minerals  1 tablet Oral Daily  . potassium chloride  40 mEq Oral BID  . tamsulosin  0.4 mg Oral Daily  . thiamine  100 mg Oral Daily  . vitamin B-12  250 mcg Oral Daily   Continuous Infusions: . sodium chloride 250 mL (06/15/18 2307)  . potassium chloride     PRN Meds:.sodium chloride, oxyCODONE-acetaminophen, promethazine   ASSESMENT:   *   Diarrhea.   CT with colonic edema.    6/28 Flex Sig. Congested appearing left colon, rectum with diminished vascular pattern.  Biopsy path unremarkable.  C diff, stool path PCR panel negative.    Protonix replaced with Famotidine as PPI can cause diarrhea.  Celiac sprue, microscopic colitis, IBD, carcinoid syndrome (24 hour 5HIAA was normal), gastrinoma (serum gastrin level normal). IgA pending.    Imodium and lomotil in place.   Day 2/5 of empiric flagyl.    *   GERD.  Chronic epigastric  pain. Home meds: Protonix, Omeprazole, Carafate prn.  Currently on Famotidine  *   Fatty liver, cirrhosis per CT.    *  Anemia, thrombocytopenia. B12 low, on oral B12.  No recent iron, folate studies.     *  Hypothyroidism.  TSH elevated at 75 on 6/29.  Home dose of synthroid 200 mcg,but likely not taking her meds.  Currently at 100 mcg.   *   Hypokalemia.  Persists (worse today) despite supplements. Oral potassium can lead to diarrhea but diarrhea predates the treatment of hypokalemia.     *   AKI.     *  Protein malnutrition?: albumin low  *  Substance abuse.  Chronic pain.  On oxycodone and oxycontin at home.  Current dose of oxycodone only is minor in comparison to listed home doses.     PLAN   *   Check prealbumin. T4, anemia panel in AM.    *  Trial Questran?     Deborah Russo  06/21/2018, 8:26 AM Phone [phone]  [phone]   ________________________________________________________________________  [facility] MD note:  I personally examined the patient, reviewed the data and agree with the assessment and plan described above.  More history today, she tells me she was taking 160 oxycontin BID and also 10 percocet twice daily (for over 20 years)  up until about 3-4 weeks ago. Perhaps her loose stools are a result of not have her usual narcotics?  Agree with labs above and will also start cholestyramine 4gm BID powder in addition to her imodium and lomotil. She should continue on flagyl 5 day course.    I spoke with RN and CNA in the room about charting stool output in epic with volumes listed so we can get a better idea of her output.    Owens Loffler, MD [facility] Pager [phone]

## 2018-06-21 NOTE — Consult Note (Signed)
[facility] CM Primary Care Navigator  06/21/2018  Deborah Russo 1961-09-15 [id number]   Met withpatient at the bedside to identify possible discharge needs.  Per MD note, patient was admitted for altered mental status thought to be due to polysubstance abuse. Patient was Russo to have hypotension, Colitis, Cirrhosis of liver without ascites and underwent sigmoidoscopy.   Patient confirmed Dr. Assunta Russo with [place] care provider.   [facility] [place] [facility].  Patient statesmanaging hermedications at homeprior to admission but will let her sister Deborah Russo) manage her medications after discharge.  Patient mentioned that her friend (who lives closeby) has beenproviding  transportation to herdoctors'appointments.  Patient reports that sister isliving with her. According to patient, both sister and friend will be assisting her with care needs at home when discharge.  Anticipated discharge plan is skilled nursing facility (SNF) for rehabilitation per therapy recommendation, once medically improved.   Patient voiced understanding to call primary care provider's office if she returnsbackhome, for a post discharge follow-upvisitwithin a week or sooner if needed.Patient letter (with PCP's contact number) was provided as a reminder.  Explained to patient about [facility] CM services available for health managementand resourcesat home but she indicated having no current needs or concerns for now. Patient verbalizedunderstandingof needto seekreferral from primary care provider to [facility] care management ifdeemed necessary and appropriatefor anyservicesin the nearfuture- once shereturns back home.   [facility] care management information provided for future needs thatpatient may have.   Primary care provider's office is listed as providing  transition of care (TOC) follow-up.    For additional questions please contact:  Deborah Russo, BSN, RN-BC [facility] PRIMARY CARE Navigator Cell: [phone]

## 2018-06-21 NOTE — Progress Notes (Signed)
   06/21/18 1400  Clinical Encounter Type  Visited With Patient  Visit Type Initial  Referral From Chaplain  Consult/Referral To Chaplain  Spiritual Encounters  Spiritual Needs Emotional  Stress Factors  Patient Stress Factors Exhausted    Pt was laying in her bed seemingly awake. Chaplain introduced herself and had a conversation with pt. Pt was very appreciative. Chaplain provided emotional support and compassionate presence.

## 2018-06-22 LAB — T4, FREE: Free T4: 0.59 ng/dL — ABNORMAL LOW (ref 0.82–1.77)

## 2018-06-22 LAB — COMPREHENSIVE METABOLIC PANEL
ALBUMIN: 2.1 g/dL — AB (ref 3.5–5.0)
ALK PHOS: 108 U/L (ref 38–126)
ALT: 13 U/L (ref 0–44)
ANION GAP: 5 (ref 5–15)
AST: 23 U/L (ref 15–41)
BUN: 7 mg/dL (ref 6–20)
CALCIUM: 7.6 mg/dL — AB (ref 8.9–10.3)
CHLORIDE: 108 mmol/L (ref 98–111)
CO2: 24 mmol/L (ref 22–32)
Creatinine, Ser: 0.98 mg/dL (ref 0.44–1.00)
GFR calc non Af Amer: >60 mL/min (ref >60)
GLUCOSE: 109 mg/dL — AB (ref 70–99)
POTASSIUM: 3.4 mmol/L — AB (ref 3.5–5.1)
SODIUM: 137 mmol/L (ref 135–145)
Total Bilirubin: 0.6 mg/dL (ref 0.3–1.2)
Total Protein: 5.1 g/dL — ABNORMAL LOW (ref 6.5–8.1)

## 2018-06-22 LAB — GLUCOSE, CAPILLARY
GLUCOSE-CAPILLARY: 100 mg/dL — AB (ref 70–99)
GLUCOSE-CAPILLARY: 106 mg/dL — AB (ref 70–99)
GLUCOSE-CAPILLARY: 125 mg/dL — AB (ref 70–99)
GLUCOSE-CAPILLARY: 99 mg/dL (ref 70–99)
Glucose-Capillary: 120 mg/dL — ABNORMAL HIGH (ref 70–99)
Glucose-Capillary: 110 mg/dL — ABNORMAL HIGH (ref 70–99)

## 2018-06-22 LAB — IRON AND TIBC
Iron: 45 ug/dL (ref 28–170)
SATURATION RATIOS: 43 % — AB (ref 10.4–31.8)
TIBC: 104 ug/dL — ABNORMAL LOW (ref 250–450)
UIBC: 59 ug/dL

## 2018-06-22 LAB — HEMOGLOBIN AND HEMATOCRIT, BLOOD
HCT: 21.7 % — ABNORMAL LOW (ref 36.0–46.0)
HEMATOCRIT: 22.4 % — AB (ref 36.0–46.0)
HEMOGLOBIN: 7.2 g/dL — AB (ref 12.0–15.0)
Hemoglobin: 7.5 g/dL — ABNORMAL LOW (ref 12.0–15.0)

## 2018-06-22 LAB — FOLATE: Folate: 13.6 ng/mL (ref >5.9)

## 2018-06-22 LAB — CBC WITH DIFFERENTIAL/PLATELET
Abs Immature Granulocytes: 0 10*3/uL (ref 0.0–0.1)
BASOS ABS: 0 10*3/uL (ref 0.0–0.1)
BASOS PCT: 0 %
EOS ABS: 0 10*3/uL (ref 0.0–0.7)
EOS PCT: 1 %
HCT: 21.8 % — ABNORMAL LOW (ref 36.0–46.0)
Hemoglobin: 7.3 g/dL — ABNORMAL LOW (ref 12.0–15.0)
Immature Granulocytes: 0 %
LYMPHS PCT: 39 %
Lymphs Abs: 1.7 10*3/uL (ref 0.7–4.0)
MCH: 36.3 pg — AB (ref 26.0–34.0)
MCHC: 33.5 g/dL (ref 30.0–36.0)
MCV: 108.5 fL — ABNORMAL HIGH (ref 78.0–100.0)
MONO ABS: 0.2 10*3/uL (ref 0.1–1.0)
Monocytes Relative: 5 %
Neutro Abs: 2.4 10*3/uL (ref 1.7–7.7)
Neutrophils Relative %: 55 %
PLATELETS: 113 10*3/uL — AB (ref 150–400)
RBC: 2.01 MIL/uL — ABNORMAL LOW (ref 3.87–5.11)
RDW: 16.6 % — AB (ref 11.5–15.5)
WBC: 4.4 10*3/uL (ref 4.0–10.5)

## 2018-06-22 LAB — MAGNESIUM: Magnesium: 1.8 mg/dL (ref 1.7–2.4)

## 2018-06-22 LAB — OCCULT BLOOD X 1 CARD TO LAB, STOOL: Fecal Occult Bld: POSITIVE — AB

## 2018-06-22 LAB — RETICULOCYTES
RBC.: 2.01 MIL/uL — AB (ref 3.87–5.11)
RETIC COUNT ABSOLUTE: 90.5 10*3/uL (ref 19.0–186.0)
Retic Ct Pct: 4.5 % — ABNORMAL HIGH (ref 0.4–3.1)

## 2018-06-22 LAB — PHOSPHORUS: PHOSPHORUS: 3 mg/dL (ref 2.5–4.6)

## 2018-06-22 LAB — PREPARE RBC (CROSSMATCH)

## 2018-06-22 LAB — VITAMIN B12: VITAMIN B 12: 523 pg/mL (ref 180–914)

## 2018-06-22 LAB — IGA: IGA: 369 mg/dL — AB (ref 87–352)

## 2018-06-22 LAB — FERRITIN: FERRITIN: 309 ng/mL — AB (ref 11–307)

## 2018-06-22 LAB — PREALBUMIN

## 2018-06-22 MED ORDER — SODIUM CHLORIDE 0.9% IV SOLUTION
Freq: Once | INTRAVENOUS | Status: AC
  Administered 2018-06-22: 21:00:00 via INTRAVENOUS

## 2018-06-22 MED ORDER — POTASSIUM CHLORIDE CRYS ER 20 MEQ PO TBCR
40.0000 meq | EXTENDED_RELEASE_TABLET | Freq: Two times a day (BID) | ORAL | Status: AC
  Administered 2018-06-22 (×2): 40 meq via ORAL
  Filled 2018-06-22 (×3): qty 2

## 2018-06-22 MED ORDER — SODIUM CHLORIDE 0.9 % IV BOLUS
500.0000 mL | Freq: Once | INTRAVENOUS | Status: AC
  Administered 2018-06-22: 500 mL via INTRAVENOUS

## 2018-06-22 NOTE — Progress Notes (Signed)
PROGRESS NOTE    Deborah Russo  JKK:[id number] DOB: 27-Nov-1961 DOA: 06/11/2018 PCP: Eustaquio Maize, MD   Brief Narrative:  Deborah Caddy Conawayis an 57 y.o.female. She was admitted June 23 for altered mental status thought to be due to polysubstance abuse. Had a similar presentation one year ago. "Discharge summary from 1 year ago was reviewed when she presented with altered mental status. There was diagnosed with acute toxic encephalopathy thought to be due to polypharmacy with unintentional overdose. However it was also thought that she may have had a stroke at that time she refused further evaluation."  Per ED initial intake "From home- sister moved out a week ago, Called EMS for SOB, was sitting on front porch of filthy home per EMS"  Was also found to be hypotensive.Was intubated for airway protection and received fluid resuscitation. Reports long history of abdominal pain treated with narcotics, persistent nausea vomiting and profuse diarrhea. Reportedly had not been eating or drinking normally and taking none of her medication   She  is extubated on June 24 and off pressors on 6/26 ,she is  transferred to hospitalist service June 28. She underwent sigmoidoscope on June 28, continue have ab pain and loose stool ,gi following and making recommendations  Assessment & Plan:   Active Problems:   Hypotension   Colitis   Cirrhosis of liver without ascites (HCC)  Pancolitis with profuse diarrhea, currently have a rectal tube , she c/o epigastric pain which is chronic by patient's report :  -Patient reports chronic Abdominal pain/ chronic diarrhea, she reports she is on pain meds for chronic abdominal pain -She reports has been having epigastric pain since she give birth to her daughter, epigastric pain get better with putting pressure on.  -GI Pathogen pcr panel negative -protonix stopped per GI due to concerns of diarrhea side effect, she is on pepecid bid  -s/p  sigmoidoscope on 6/28 -GI following,  will follow gi recommendations -[name] commending starting cholestyramine 4 g twice daily in addition to Imodium and Lomotil and also continuing a 5-day course of metronidazole -Flexiseal dislodge but stool consistency changing and not as liquidy/runny  Electrolytes abnormalities:  Hypokalemia: k on presentation less than 2, improving, but remain low, continue to replace to keep k>4; K+ now 3.4 -Replete  -Continue to Monitor and repeat CMP in AM   Hypomagnesemia: mag 1.3 on presentation, replaced, improving, keep replace to keep mag >2; Mag now 1.8  Hypocalcemia: calcium on presentation was 5.3, replaced much improved, calcium 7.6, albumin 2.1 -Repeat CMP in AM   Hypophosphatemia: phos on presentation is less than 1, replaced and now is 3.0 -Replete as necessary -Repeat Phos level in AM   Encephalopathy, improved  -Ct head no acute findings, 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 ( perc chart review she was hospitalized for left mca stroke in 03/2017) -C/w supportive care ,  Improving, she is AAOX3 since 7/2 -start PT, she reports she uses a walker for many years at baseline -PT/OT Recommending SNF  Macrocytic Anemia -mcv 107 on presentation -hgb around 9-10 ( hgb a year ago around 13-14) -Hemoglobin/hematocrit went from 9.3/27.9 and is now down to 7.2/21.7 -Check FOBT -Continue to monitor for signs and symptoms of bleeding -Checked Anemia Panel and showed an iron level 45, TIBC of 59, TIBC of 104, saturation ratio 43%, ferritin level 309, folate level 13.6, and vitamin B12 523 -Will Type and Screen and Transfuse 1 unit of pRBCs -Repeat CBC in a.m.  Hypothyroidism:  -  TSH on presentation was 55 -She is started on Synthroid 100 mcg po Daily ( per chart review she has a long history of noncompliance with meds) and will continue   Adrenal insufficiency:  From long term opioids use? -Random cortisol 13, no increase after  cosyntropin administration. -Ct ab "bilateral adrenal glands are normal in appearance." -Started on Fludrocortisone 0.1 mg daily  Low B12 -Started on supplementation and will continue 250 mcg po Daily   Thrombocytopenia -Platelet Count on presentation was 37, ( a year ago was 117-146 per chart reveiw) -CT ab showed "Hepatic steatosis with evidence of cirrhosis."  With trace ascites, spleen unremarkable -Platelet Count improving and is now 113  Acute Urinary Retention She has a foley placed on 6/22 due to urinary retention, foley removed on 6/28, repeat  bladder scan on 6/29, again urinary retention of 643cc, reinsert foley -start trial of flomax, start Physical therapy,  -Plan to have voiding trial prior to discharge, if she fail again, may need to d/c to snf with foley and urology follow up.  Aortic atherosclerosis -Including high-grade stenosis or occlusion of the left common iliac artery.with distal reconstitution of flow related to collateral Pathways On CT Adominal and Pelvis   Obesity -Body mass index is 37.68 kg/m.  -Weight Loss Counseling given  Tobacco Abuse/Smoker -Smoking cessation education provided .  Chronic pain (epigastric pain)  -[facility] -She reports she has not followed up with pain clinic for several months if not longer.  -She reports she run of pain meds a several weeks ago, not sure if story reliable.  Generalized Weakness/Deconditioning -PT/OT recommending SNF  DVT prophylaxis: Enoxaparin 40 mg sq q24h Code Status: FULL CODE Family Communication: No family present at bedside  Disposition Plan: SNF once cleared by Gastroenterology as patient is agreeable   Consultants:   PCCM Transfer  Gastroenterology    Procedures:  Extubation 6/24 Sigmoidoscopy 6/28   Antimicrobials:  Anti-infectives (From admission, onward)   Start     Dose/Rate Route Frequency Ordered Stop   06/20/18 0900  metroNIDAZOLE (FLAGYL)  tablet 500 mg     500 mg Oral Every 8 hours 06/20/18 0854 06/25/18 0559   06/12/18 0000  vancomycin (VANCOCIN) IVPB 1000 mg/200 mL premix  Status:  Discontinued     1,000 mg 200 mL/hr over 60 Minutes Intravenous Every 12 hours 06/11/18 1419 06/13/18 1021   06/11/18 2100  piperacillin-tazobactam (ZOSYN) IVPB 3.375 g  Status:  Discontinued     3.375 g 12.5 mL/hr over 240 Minutes Intravenous Every 8 hours 06/11/18 1419 06/15/18 0626   06/11/18 1300  vancomycin (VANCOCIN) 1,500 mg in sodium chloride 0.9 % 500 mL IVPB     1,500 mg 250 mL/hr over 120 Minutes Intravenous  Once 06/11/18 1143 06/11/18 1444   06/11/18 1145  piperacillin-tazobactam (ZOSYN) IVPB 3.375 g     3.375 g 100 mL/hr over 30 Minutes Intravenous  Once 06/11/18 1137 06/11/18 1230   06/11/18 1145  vancomycin (VANCOCIN) IVPB 1000 mg/200 mL premix  Status:  Discontinued     1,000 mg 200 mL/hr over 60 Minutes Intravenous  Once 06/11/18 1137 06/11/18 1142     Subjective: Seen and Examined and felt unchanged from yesterday.  Still was having some loose liquidy stools however per nursing they became more formed throughout the day.  No chest pain, shortness of breath but did have some epigastric pain again.  Still felt extremely weak.  No other complaints or concerns at this time.  Objective: Vitals:   06/21/18 2021 06/22/18 0423 06/22/18 0753 06/22/18 1644  BP: (!) 101/54 (!) 85/48 (!) 72/57 (!) 99/56  Pulse: 76 77 80 79  Resp: 16 18 18 18   Temp: 98.3 F (36.8 C) 98.6 F (37 C) 98.2 F (36.8 C) 97.9 F (36.6 C)  TempSrc: Oral Oral Oral   SpO2: 97% 97% 96% 99%  Weight:      Height:        Intake/Output Summary (Last 24 hours) at 06/22/2018 1852 Last data filed at 06/22/2018 1828 Gross per 24 hour  Intake 1680 ml  Output 1150 ml  Net 530 ml   Filed Weights   06/19/18 1253 06/19/18 2007 06/20/18 2040  Weight: 93.4 kg (206 lb) 93.4 kg (206 lb) 93.4 kg (206 lb 0.1 oz)   Examination: Physical Exam:  Constitutional:  Well-nourished, well-developed obese Caucasian female currently no acute distress appears calm but slightly uncomfortable Eyes: Sclera anicteric.  Lids and conjunctive are normal ENMT: External ears and nose appear normal.  Grossly normal hearing Neck: Neck is supple with no JVD Respiratory: Diminished to auscultation bilaterally with no appreciable wheezing, rales, rhonchi.  Patient not tachypneic using his muscle breathe Cardiovascular: Regular rate and rhythm.  No appreciable murmurs, rubs, gallops.  Has mild lower extremity edema Abdomen: Soft, slightly tender to palpate.  Distended secondary body habitus.  Bowel sounds positive hyperactive GU: Deferred.  Has a Foley catheter Digna shield in place Musculoskeletal: No contractures or cyanosis.  No joint deformities in upper extremities Skin: Skin is warm and dry with no appreciable rashes or lesions limited skin evaluation  Neurologic: Cranial nerves II through XII grossly intact no appreciable focal deficits Psychiatric: Depressed appearing mood and affect.  Normal judgment and insight.  Patient is awake and alert and oriented x3  Data Reviewed: I have personally reviewed following labs and imaging studies  CBC: Recent Labs  Lab 06/17/18 0810 06/19/18 1835 06/21/18 0838 06/21/18 1817 06/22/18 0353 06/22/18 1524  WBC 10.7* 6.5 5.0 4.4 4.4  --   NEUTROABS  --   --  2.6  --  2.4  --   HGB 9.7* 9.3* 8.1* 7.6* 7.3* 7.2*  HCT 28.4* 27.9* 24.1* 22.5* 21.8* 21.7*  MCV 105.2* 111.2* 107.6* 109.2* 108.5*  --   PLT 78* 87* 114* 113* 113*  --    Basic Metabolic Panel: Recent Labs  Lab 06/15/18 1940  06/18/18 0649 06/19/18 0529 06/19/18 1835 06/20/18 0651 06/21/18 0450 06/21/18 0838 06/21/18 1817 06/22/18 0353  NA 136   < > 134* 134*  --  134* 135  --   --  137  K 4.9   < > 3.2* 3.0*  --  3.8 2.8*  --   --  3.4*  CL 108   < > 107 103  --  106 106  --   --  108  CO2 20*   < > 23 24  --  21* 23  --   --  24  GLUCOSE 134*   < >  119* 127*  --  122* 130*  --   --  109*  BUN <5*   < > <5* 7  --  6 7  --   --  7  CREATININE 0.98   < > 1.01* 1.10*  --  1.09* 1.01*  --   --  0.98  CALCIUM 7.9*   < > 7.7* 7.6*  --  7.9* 7.6*  --   --  7.6*  MG  --    < >  1.8  --  2.1  --   --  1.9 1.8 1.8  PHOS 2.6  --   --  2.4*  --  2.4*  --  2.4*  --  3.0   < > = values in this interval not displayed.   GFR: Estimated Creatinine Clearance: 66.6 mL/min (by C-G formula based on SCr of 0.98 mg/dL). Liver Function Tests: Recent Labs  Lab 06/15/18 1940 06/18/18 0649 06/22/18 0353  AST  --  15 23  ALT  --  11 13  ALKPHOS  --  77 108  BILITOT  --  0.5 0.6  PROT  --  4.7* 5.1*  ALBUMIN 2.6* 2.1* 2.1*   No results for input(s): LIPASE, AMYLASE in the last 168 hours. Recent Labs  Lab 06/17/18 0810  AMMONIA 43*   Coagulation Profile: No results for input(s): INR, PROTIME in the last 168 hours. Cardiac Enzymes: No results for input(s): CKTOTAL, CKMB, CKMBINDEX, TROPONINI in the last 168 hours. BNP (last 3 results) No results for input(s): PROBNP in the last 8760 hours. HbA1C: No results for input(s): HGBA1C in the last 72 hours. CBG: Recent Labs  Lab 06/22/18 0010 06/22/18 0422 06/22/18 0751 06/22/18 1135 06/22/18 1643  GLUCAP 106* 100* 120* 99 110*   Lipid Profile: No results for input(s): CHOL, HDL, LDLCALC, TRIG, CHOLHDL, LDLDIRECT in the last 72 hours. Thyroid Function Tests: Recent Labs    06/22/18 0353  FREET4 0.59*   Anemia Panel: Recent Labs    06/22/18 0353  VITAMINB12 523  FOLATE 13.6  FERRITIN 309*  TIBC 104*  IRON 45  RETICCTPCT 4.5*   Sepsis Labs: No results for input(s): PROCALCITON, LATICACIDVEN in the last 168 hours.  Recent Results (from the past 240 hour(s))  C Difficile Quick Screen w PCR reflex     Status: None   Collection Time: 06/13/18  4:19 PM  Result Value Ref Range Status   C Diff antigen NEGATIVE NEGATIVE Final   C Diff toxin NEGATIVE NEGATIVE Final   C Diff  interpretation No C. difficile detected.  Final    Comment: Performed at [facility], [place] [address], [place], [place] [address]  OVA + PARASITE EXAM     Status: None   Collection Time: 06/13/18  7:12 PM  Result Value Ref Range Status   OVA + PARASITE EXAM Final report  Final    Comment: (NOTE) These results were obtained using wet preparation(s) and trichrome stained smear. This test does not include testing for Cryptosporidium parvum, Cyclospora, or Microsporidia. Performed At: [facility] [place], [place] [id number] Elwanda Brooklyn R MD DX:[id number]    Source of Sample STOOL  Final  Calprotectin, Fecal     Status: None   Collection Time: 06/13/18  7:12 PM  Result Value Ref Range Status   Calprotectin, Fecal 39 0 - 120 ug/g Final    Comment: (NOTE) Concentration     Interpretation   Follow-Up <16 - 50 ug/g     Normal           None >50 -120 ug/g     Borderline       Re-evaluate in 4-6 weeks    >120 ug/g     Abnormal         Repeat as clinically  indicated Performed At: [facility] [place], [place] [id number] Rush Farmer MD VZ:[id number]   Gastrointestinal Panel by PCR , Stool     Status: None   Collection Time: 06/15/18 12:43 PM  Result Value Ref Range Status   Campylobacter species NOT DETECTED NOT DETECTED Final   Plesimonas shigelloides NOT DETECTED NOT DETECTED Final   Salmonella species NOT DETECTED NOT DETECTED Final   Yersinia enterocolitica NOT DETECTED NOT DETECTED Final   Vibrio species NOT DETECTED NOT DETECTED Final   Vibrio cholerae NOT DETECTED NOT DETECTED Final   Enteroaggregative E coli (EAEC) NOT DETECTED NOT DETECTED Final   Enteropathogenic E coli (EPEC) NOT DETECTED NOT DETECTED Final   Enterotoxigenic E coli (ETEC) NOT DETECTED NOT DETECTED Final   Shiga like toxin producing E coli (STEC) NOT DETECTED NOT DETECTED Final   Shigella/Enteroinvasive E coli (EIEC) NOT  DETECTED NOT DETECTED Final   Cryptosporidium NOT DETECTED NOT DETECTED Final   Cyclospora cayetanensis NOT DETECTED NOT DETECTED Final   Entamoeba histolytica NOT DETECTED NOT DETECTED Final   Giardia lamblia NOT DETECTED NOT DETECTED Final   Adenovirus F40/41 NOT DETECTED NOT DETECTED Final   Astrovirus NOT DETECTED NOT DETECTED Final   Norovirus GI/GII NOT DETECTED NOT DETECTED Final   Rotavirus A NOT DETECTED NOT DETECTED Final   Sapovirus (I, II, IV, and V) NOT DETECTED NOT DETECTED Final    Comment: Performed at [facility], [address] [place], [place] [address]    Radiology Studies: No results found.   Scheduled Meds: . cholecalciferol  1,000 Units Oral Daily  . cholestyramine  4 g Oral BID  . diphenoxylate-atropine  1 tablet Oral QID  . enoxaparin (LOVENOX) injection  40 mg Subcutaneous Q24H  . famotidine  20 mg Oral BID  . feeding supplement  1 Container Oral TID BM  . fludrocortisone  0.1 mg Oral Daily  . folic acid  1 mg Per Tube Daily  . insulin aspart  0-15 Units Subcutaneous Q4H  . levothyroxine  100 mcg Oral QAC breakfast  . loperamide  4 mg Oral BID  . mouth rinse  15 mL Mouth Rinse q12n4p  . metroNIDAZOLE  500 mg Oral Q8H  . multivitamin with minerals  1 tablet Oral Daily  . potassium chloride  40 mEq Oral BID  . tamsulosin  0.4 mg Oral Daily  . thiamine  100 mg Oral Daily  . vitamin B-12  250 mcg Oral Daily   Continuous Infusions: . sodium chloride 250 mL (06/21/18 0942)    LOS: 11 days   Kerney Elbe, DO [facility] Pager [phone]  If 7PM-7AM, please contact night-coverage [email or web address] Password [email or web address] 06/22/2018, 6:52 PM

## 2018-06-22 NOTE — Progress Notes (Signed)
PT Cancellation Note  Patient Details Name: Deborah Russo MRN: [id number] DOB: [birth year]   Cancelled Treatment:    Reason Eval/Treat Not Completed: Other (comment). Patient refusing out of bed mobility secondary to "I need to watch the Young and Restless." Max encouragement provided for participation but patient maintained refusal. Will follow.  Ellamae Sia, PT, DPT Acute Rehabilitation Services  Pager: [phone]    Willy Eddy 06/22/2018, 2:06 PM

## 2018-06-22 NOTE — Progress Notes (Signed)
Patient's flexiseal came out while cleaning her up after having a BM. MD notified. Will continue to monitor.   Farley Ly RN

## 2018-06-23 LAB — CBC WITH DIFFERENTIAL/PLATELET
Abs Immature Granulocytes: 0 10*3/uL (ref 0.0–0.1)
Basophils Absolute: 0 10*3/uL (ref 0.0–0.1)
Basophils Relative: 1 %
EOS PCT: 1 %
Eosinophils Absolute: 0 10*3/uL (ref 0.0–0.7)
HEMATOCRIT: 26.3 % — AB (ref 36.0–46.0)
HEMOGLOBIN: 8.9 g/dL — AB (ref 12.0–15.0)
Immature Granulocytes: 1 %
LYMPHS ABS: 1.5 10*3/uL (ref 0.7–4.0)
LYMPHS PCT: 39 %
MCH: 35.3 pg — AB (ref 26.0–34.0)
MCHC: 33.8 g/dL (ref 30.0–36.0)
MCV: 104.4 fL — ABNORMAL HIGH (ref 78.0–100.0)
MONO ABS: 0.2 10*3/uL (ref 0.1–1.0)
Monocytes Relative: 5 %
Neutro Abs: 2.1 10*3/uL (ref 1.7–7.7)
Neutrophils Relative %: 53 %
Platelets: 123 10*3/uL — ABNORMAL LOW (ref 150–400)
RBC: 2.52 MIL/uL — ABNORMAL LOW (ref 3.87–5.11)
RDW: 18.1 % — ABNORMAL HIGH (ref 11.5–15.5)
WBC: 3.9 10*3/uL — ABNORMAL LOW (ref 4.0–10.5)

## 2018-06-23 LAB — COMPREHENSIVE METABOLIC PANEL
ALBUMIN: 2.2 g/dL — AB (ref 3.5–5.0)
ALK PHOS: 117 U/L (ref 38–126)
ALT: 13 U/L (ref 0–44)
ANION GAP: 6 (ref 5–15)
AST: 21 U/L (ref 15–41)
BILIRUBIN TOTAL: 0.7 mg/dL (ref 0.3–1.2)
BUN: 7 mg/dL (ref 6–20)
CALCIUM: 7.8 mg/dL — AB (ref 8.9–10.3)
CO2: 24 mmol/L (ref 22–32)
CREATININE: 0.91 mg/dL (ref 0.44–1.00)
Chloride: 108 mmol/L (ref 98–111)
GFR calc non Af Amer: >60 mL/min (ref >60)
GLUCOSE: 121 mg/dL — AB (ref 70–99)
Potassium: 3.8 mmol/L (ref 3.5–5.1)
Sodium: 138 mmol/L (ref 135–145)
TOTAL PROTEIN: 5.3 g/dL — AB (ref 6.5–8.1)

## 2018-06-23 LAB — PHOSPHORUS: Phosphorus: 2.3 mg/dL — ABNORMAL LOW (ref 2.5–4.6)

## 2018-06-23 LAB — TYPE AND SCREEN
ABO/RH(D): B POS
Antibody Screen: NEGATIVE
UNIT DIVISION: 0

## 2018-06-23 LAB — GLUCOSE, CAPILLARY
GLUCOSE-CAPILLARY: 111 mg/dL — AB (ref 70–99)
GLUCOSE-CAPILLARY: 168 mg/dL — AB (ref 70–99)
Glucose-Capillary: 105 mg/dL — ABNORMAL HIGH (ref 70–99)
Glucose-Capillary: 116 mg/dL — ABNORMAL HIGH (ref 70–99)
Glucose-Capillary: 129 mg/dL — ABNORMAL HIGH (ref 70–99)
Glucose-Capillary: 180 mg/dL — ABNORMAL HIGH (ref 70–99)

## 2018-06-23 LAB — CBC
HCT: 27.2 % — ABNORMAL LOW (ref 36.0–46.0)
Hemoglobin: 9.1 g/dL — ABNORMAL LOW (ref 12.0–15.0)
MCH: 35.7 pg — AB (ref 26.0–34.0)
MCHC: 33.5 g/dL (ref 30.0–36.0)
MCV: 106.7 fL — ABNORMAL HIGH (ref 78.0–100.0)
PLATELETS: 123 10*3/uL — AB (ref 150–400)
RBC: 2.55 MIL/uL — ABNORMAL LOW (ref 3.87–5.11)
RDW: 18.7 % — AB (ref 11.5–15.5)
WBC: 3.9 10*3/uL — AB (ref 4.0–10.5)

## 2018-06-23 LAB — OCCULT BLOOD X 1 CARD TO LAB, STOOL: Fecal Occult Bld: POSITIVE — AB

## 2018-06-23 LAB — BPAM RBC
Blood Product Expiration Date: 201907192359
ISSUE DATE / TIME: 201907042058
UNIT TYPE AND RH: 7300

## 2018-06-23 LAB — MAGNESIUM
Magnesium: 1.8 mg/dL (ref 1.7–2.4)
Magnesium: 1.7 mg/dL (ref 1.7–2.4)

## 2018-06-23 MED ORDER — METRONIDAZOLE 500 MG PO TABS: 500.0000 mg | ORAL_TABLET | Freq: Three times a day (TID) | ORAL | 0 refills | Status: AC

## 2018-06-23 MED ORDER — THIAMINE HCL 100 MG PO TABS: 100.0000 mg | ORAL_TABLET | Freq: Every day | ORAL | Status: DC

## 2018-06-23 MED ORDER — OXYCODONE-ACETAMINOPHEN 5-325 MG PO TABS: 1.0000 | ORAL_TABLET | Freq: Three times a day (TID) | ORAL | 0 refills | Status: DC | PRN

## 2018-06-23 MED ORDER — DIPHENOXYLATE-ATROPINE 2.5-0.025 MG PO TABS: 1.0000 | ORAL_TABLET | Freq: Three times a day (TID) | ORAL | 0 refills | Status: DC

## 2018-06-23 MED ORDER — CYANOCOBALAMIN 250 MCG PO TABS: 250.0000 ug | ORAL_TABLET | Freq: Every day | ORAL | Status: DC

## 2018-06-23 MED ORDER — CHOLESTYRAMINE 4 G PO PACK: 4.0000 g | PACK | Freq: Two times a day (BID) | ORAL | 12 refills | Status: DC

## 2018-06-23 MED ORDER — CHOLECALCIFEROL 25 MCG (1000 UT) PO TABS: 1000.0000 [IU] | ORAL_TABLET | Freq: Every day | ORAL | Status: DC

## 2018-06-23 MED ORDER — LOPERAMIDE HCL 2 MG PO CAPS: 4.0000 mg | ORAL_CAPSULE | Freq: Two times a day (BID) | ORAL | 0 refills | Status: DC

## 2018-06-23 MED ORDER — FOLIC ACID 1 MG PO TABS: 1.0000 mg | ORAL_TABLET | Freq: Every day | ORAL | Status: DC

## 2018-06-23 MED ORDER — K PHOS MONO-SOD PHOS DI & MONO 155-852-130 MG PO TABS
500.0000 mg | ORAL_TABLET | Freq: Once | ORAL | Status: AC
  Administered 2018-06-23: 500 mg via ORAL
  Filled 2018-06-23: qty 2

## 2018-06-23 MED ORDER — ADULT MULTIVITAMIN W/MINERALS CH: 1.0000 | ORAL_TABLET | Freq: Every day | ORAL | Status: DC

## 2018-06-23 MED ORDER — FLUDROCORTISONE ACETATE 0.1 MG PO TABS: 0.1000 mg | ORAL_TABLET | Freq: Every day | ORAL | Status: DC

## 2018-06-23 MED ORDER — LEVOTHYROXINE SODIUM 100 MCG PO TABS: 100.0000 ug | ORAL_TABLET | Freq: Every day | ORAL | Status: DC

## 2018-06-23 MED ORDER — TAMSULOSIN HCL 0.4 MG PO CAPS: 0.4000 mg | ORAL_CAPSULE | Freq: Every day | ORAL | Status: DC

## 2018-06-23 NOTE — Progress Notes (Addendum)
Daily Rounding Note  06/23/2018, 9:18 AM  LOS: 12 days   SUBJECTIVE:    Pt unable to recall if having BM's today or yesterday.  RN says pt incontinent, the flexiseal fell out and hasn't been replaced.  Stools not frequent or large but loose and not grossly bloody.  They tested FOBT + in lab.    OBJECTIVE:         Vital signs in last 24 hours:    Temp:  [97.9 F (36.6 C)-98.6 F (37 C)] 98.3 F (36.8 C) (07/05 0727) Pulse Rate:  [68-82] 71 (07/05 0727) Resp:  [16-20] 18 (07/05 0727) BP: (91-135)/(46-73) 135/73 (07/05 0727) SpO2:  [95 %-99 %] 99 % (07/05 0727) Weight:  [200 lb 2.8 oz (90.8 kg)] 200 lb 2.8 oz (90.8 kg) (07/04 2025) Last BM Date: 06/23/18 Filed Weights   06/19/18 2007 06/20/18 2040 06/22/18 2025  Weight: 206 lb (93.4 kg) 206 lb 0.1 oz (93.4 kg) 200 lb 2.8 oz (90.8 kg)   General: looks chronically unwell.  Dishevelled, unkempt.   Heart: RRR Chest: clear bil.  No labored breathing Abdomen: soft, NT, ND.  Active BS.  Extremities: non-pitting LE edema.   Neuro/Psych:  Oriented to place, self, situation.  Not able to recall if she had any BM today or yesterday.     Lab Results: Recent Labs    06/21/18 1817 06/22/18 0353 06/22/18 1524 06/22/18 1954 06/23/18 0752  WBC 4.4 4.4  --   --  3.9*  HGB 7.6* 7.3* 7.2* 7.5* 8.9*  HCT 22.5* 21.8* 21.7* 22.4* 26.3*  PLT 113* 113*  --   --  123*   BMET Recent Labs    06/21/18 0450 06/22/18 0353  NA 135 137  K 2.8* 3.4*  CL 106 108  CO2 23 24  GLUCOSE 130* 109*  BUN 7 7  CREATININE 1.01* 0.98  CALCIUM 7.6* 7.6*   LFT Recent Labs    06/22/18 0353  PROT 5.1*  ALBUMIN 2.1*  AST 23  ALT 13  ALKPHOS 108  BILITOT 0.6   Scheduled Meds: . cholecalciferol  1,000 Units Oral Daily  . cholestyramine  4 g Oral BID  . diphenoxylate-atropine  1 tablet Oral QID  . famotidine  20 mg Oral BID  . feeding supplement  1 Container Oral TID BM  .  fludrocortisone  0.1 mg Oral Daily  . folic acid  1 mg Per Tube Daily  . insulin aspart  0-15 Units Subcutaneous Q4H  . levothyroxine  100 mcg Oral QAC breakfast  . loperamide  4 mg Oral BID  . mouth rinse  15 mL Mouth Rinse q12n4p  . metroNIDAZOLE  500 mg Oral Q8H  . multivitamin with minerals  1 tablet Oral Daily  . tamsulosin  0.4 mg Oral Daily  . thiamine  100 mg Oral Daily  . vitamin B-12  250 mcg Oral Daily   Continuous Infusions: . sodium chloride 250 mL (06/21/18 0942)   PRN Meds:.sodium chloride, oxyCODONE-acetaminophen, promethazine   ASSESMENT:   *   Diarrhea.  Incontinent with lesser volume of stool output.   CT findings of severe pan-colitis out of proportion to findings on Flex Sig.  .    6/28 Flex Sig. Congested appearing left colon, rectum with diminished vascular pattern.  Biopsy path unremarkable.  C diff, stool path PCR panel negative.     Celiac sprue, microscopic colitis,IBD,carcinoid syndrome (24 hour 5HIAA was normal), gastrinoma (serum gastrin  level normal). IgA 369 (RR 87-352).    Imodium, lomotil, cholestyramine in place.   Protonix replaced with Famotidine as PPI can cause diarrhea. Day 4/5 of empiric flagyl.  FOBT +  But no grossly bloody stools.    *   GERD.  Chronic epigastric pain. Home meds: Protonix, Omeprazole, Carafate prn.  Currently on Famotidine  *   Fatty liver, cirrhosis as well as aortic atherosclerosis and high-grade stenosis occlusion of left common iliac artery per CT.    *  Anemia, thrombocytopenia. B12 low, on oral B12.  Folate ok.  Ferritin 309.  Normal iron, low TIBC, elevated iron sat.     *  Hypothyroidism.  TSH elevated at 75 on 6/29.  Home dose of synthroid 200 mcg, but likely not taking her meds. Synthroid restarted at 100 mcg.  Free T4 0.59 (RR 0.82-1.77).    *   Hypokalemia. Resolved, normal K yesterday and today.     *   AKI.     *  Protein malnutrition.  Pre-albumin < 5.    *  Substance abuse.  Chronic  pain.  On large doses of oxycodone and oxycontin at home.  Current dose of oxycodone minor in comparison to listed home doses.    *   Encephalopathy.  Mild elevation of Ammonia (43 on 6/29) but suspect multifactorial and ? Underlying dementia?.  Mild atrophy and old left MCA infarct per CT 6/23.      PLAN   *   Stop getting H/H every 6 hours.  AM CBC is adequate.  No need to keep checking stool for FOB, she tested +, so unless there is gross bleeding, no need to keep checking.     *  Continue questran, imodium, lomotil.  Finish 5 d Flagyl tomorrow.    *  Ok to leave the flexiseal out, prolonged use can lead to rectal irritation, even ulcers which can cause FOBT+.     Azucena Freed  06/23/2018, 9:18 AM Phone [phone]   ________________________________________________________________________  Velora Heckler GI MD note:  I personally examined the patient, reviewed the data and agree with the assessment and plan described above.  Diarrhea slowly resolving. Unclear etiology. Her diffusely thickened colon is probably more an indication of signficant malnutrition (given flex sig that essentially normal mucosa).  She is ok from my standpoint to be d/c. She should stay on questran BID, imodium BID, lomotil TID, complete flagyl oral 5 day course.  I understand she is heading to SNF from here and that seems appropriate. She should follow up with her primary GI, [name] as needed.   Owens Loffler, MD [facility] Pager [phone]

## 2018-06-23 NOTE — Progress Notes (Signed)
Physical Therapy Treatment Patient Details Name: Deborah Russo MRN: [id number] DOB: Jul 04, 1961 Today's Date: 06/23/2018    History of Present Illness Rhena N Conawayis an 57 y.o.female. She was admitted June 23 for altered mental status thought to be due to polysubstance abuse. Had a similar presentation one year ago. Was also found to be hypotensive.Was intubated for airway protection and received fluid resuscitation, extubated June 24;Reports long history of abdominal pain treated with narcotics, persistent nausea vomiting and profuse diarrhea.  has a past medical history of Chronic abdominal pain, Chronic migraine, Chronic pain, Hypothyroidism, Nausea, vomiting, and diarrhea, Pain management, Sleep apnea, and Stroke ([place]).    PT Comments    Plan for d/c to SNF today. Patient agreeable for therapy with increased encouragement. Demonstrates improvement requiring less overall assist with functional mobility. Ambulated from bed to chair with walker and min guard assist. Orthostatics assessed and stable. Discharge recommendation remains appropriate.     Follow Up Recommendations  SNF     Equipment Recommendations  Rolling walker with 5" wheels;3in1 (PT)    Recommendations for Other Services       Precautions / Restrictions Precautions Precautions: Fall Restrictions Weight Bearing Restrictions: No    Mobility  Bed Mobility Overal bed mobility: Needs Assistance Bed Mobility: Supine to Sit     Supine to sit: Min assist     General bed mobility comments: min assist to elevate trunk to sit. cues to use bed rails  Transfers Overall transfer level: Needs assistance Equipment used: Rolling walker (2 wheeled) Transfers: Sit to/from Stand Sit to Stand: Min guard;+2 safety/equipment            Ambulation/Gait Ambulation/Gait assistance: Min guard;+2 safety/equipment Gait Distance (Feet): 3 Feet Assistive device: Rolling walker (2 wheeled) Gait  Pattern/deviations: Decreased step length - right;Decreased step length - left;Trunk flexed     General Gait Details: min guard for safety   Stairs             Wheelchair Mobility    Modified Rankin (Stroke Patients Only)       Balance             Standing balance-Leahy Scale: Poor Standing balance comment: Dependent on UE support                            Cognition Arousal/Alertness: Awake/alert Behavior During Therapy: WFL for tasks assessed/performed Overall Cognitive Status: Within Functional Limits for tasks assessed                                        Exercises      General Comments        Pertinent Vitals/Pain Pain Assessment: Faces Faces Pain Scale: No hurt    Home Living                      Prior Function            PT Goals (current goals can now be found in the care plan section) Acute Rehab PT Goals Patient Stated Goal: Wants to get stronger at rehab and then go home PT Goal Formulation: With patient Time For Goal Achievement: 07/02/18 Potential to Achieve Goals: Good Progress towards PT goals: Progressing toward goals    Frequency    Min 2X/week      PT Plan Current plan remains  appropriate    Co-evaluation              AM-PAC PT "6 Clicks" Daily Activity  Outcome Measure  Difficulty turning over in bed (including adjusting bedclothes, sheets and blankets)?: A Little Difficulty moving from lying on back to sitting on the side of the bed? : Unable Difficulty sitting down on and standing up from a chair with arms (e.g., wheelchair, bedside commode, etc,.)?: Unable Help needed moving to and from a bed to chair (including a wheelchair)?: A Lot Help needed walking in hospital room?: A Lot Help needed climbing 3-5 steps with a railing? : Total 6 Click Score: 10    End of Session Equipment Utilized During Treatment: Gait belt Activity Tolerance: Patient tolerated treatment  well Patient left: with call bell/phone within reach;in chair;with chair alarm set Nurse Communication: Mobility status;Patient requests pain meds PT Visit Diagnosis: Muscle weakness (generalized) (M62.81);Unsteadiness on feet (R26.81)     Time: 5170-0174 PT Time Calculation (min) (ACUTE ONLY): 14 min  Charges:  $Therapeutic Activity: 8-22 mins                    G Codes:       Ellamae Sia, PT, DPT Acute Rehabilitation Services  Pager: [place] 06/23/2018, 4:36 PM

## 2018-06-23 NOTE — NC FL2 (Signed)
[place] LEVEL OF CARE SCREENING TOOL     IDENTIFICATION  Patient Name: Deborah Russo Birthdate: 1961/06/26 Sex: female Admission Date (Current Location): 06/11/2018  [facility] and [place] Number:  [employer] and Address:  [facility], [place] [address] [place], [place] [address]      Provider Number: [id number]  Attending Physician Name and Address:  Kerney Elbe, DO  Relative Name and Phone Number:  Loura Halt - sister - [phone]    Current Level of Care: Hospital Recommended Level of Care: [place] Prior Approval Number:    Date Approved/Denied:   PASRR Number: [id number](Eff. 06/20/18)  Discharge Plan: SNF    Current Diagnoses: Patient Active Problem List   Diagnosis Date Noted  . Colitis   . Cirrhosis of liver without ascites ([place])   . Hypotension 06/11/2018  . Altered mental status 06/17/2017  . Polypharmacy   . Palliative care encounter   . Goals of care, counseling/discussion   . DNR (do not resuscitate) discussion   . Acute CVA (cerebrovascular accident) ([place]) 03/29/2017  . Tobacco abuse 03/29/2017  . HTN (hypertension) 03/29/2017  . Aortic atherosclerosis ([place]) 09/29/2016  . Chronic migraine 09/29/2016  . H/O Clostridium difficile infection 07/13/2014  . Diarrhea 07/13/2014  . FTT (failure to thrive) in adult 07/13/2014  . Abdominal pain, unspecified site 07/13/2014  . Chronic abdominal pain   . Abdominal pain 06/19/2014  . Hypothyroidism 06/19/2014  . Hypokalemia 06/19/2014  . Chronic pain 06/19/2014  . Anemia 06/19/2014  . Abscess of axilla, left 03/28/2013    Orientation RESPIRATION BLADDER Height & Weight     Self, Time, Situation, Place  Normal Continent, Indwelling catheter(Urethral cath placed 6/29 for acute urinary retention) Weight: 200 lb 2.8 oz (90.8 kg) Height:  5' 2"  (157.5 cm)  BEHAVIORAL SYMPTOMS/MOOD NEUROLOGICAL BOWEL NUTRITION STATUS  (None)  (None) Incontinent(Rectal tube placed 6/25) Diet(Soft)  AMBULATORY STATUS COMMUNICATION OF NEEDS Skin   Limited Assist Verbally Other (Comment)(Cracking bilateral foot and elbow; Ecchymosis bilater arm and leg; MASD groin, perineum-treated with barrier cream)                       Personal Care Assistance Level of Assistance  Bathing, Feeding, Dressing Bathing Assistance: Limited assistance Feeding assistance: Independent Dressing Assistance: Limited assistance     Functional Limitations Info  Sight, Hearing, Speech Sight Info: Impaired Hearing Info: Adequate Speech Info: Adequate    SPECIAL CARE FACTORS FREQUENCY  PT (By licensed PT)     PT Frequency: Evaluated 6/30 and a minimum of 2X per week therapy recommended during acute inpatient stay              Contractures Contractures Info: Not present    Additional Factors Info  Code Status, Allergies, Insulin Sliding Scale Code Status Info: Full Allergies Info: Penicillins, Sulfa antibiotics   Insulin Sliding Scale Info: 0-15 Units subcutaneous every 4 hours       Current Medications (06/23/2018):  This is the current hospital active medication list Current Facility-Administered Medications  Medication Dose Route Frequency Provider Last Rate Last Dose  . 0.9 %  sodium chloride infusion  250 mL Intravenous PRN [name], MD 10 mL/hr at 06/21/18 0942 250 mL at 06/21/18 0942  . cholecalciferol (VITAMIN D) tablet 1,000 Units  1,000 Units Oral Daily [name], MD   1,000 Units at 06/23/18 1008  . cholestyramine (QUESTRAN) packet 4 g  4 g  Oral BID Milus Banister, MD   4 g at 06/23/18 1008  . diphenoxylate-atropine (LOMOTIL) 2.5-0.025 MG per tablet 1 tablet  1 tablet Oral QID [name], MD   1 tablet at 06/23/18 1010  . famotidine (PEPCID) tablet 20 mg  20 mg Oral BID Karren Cobble, RPH   20 mg at 06/23/18 1009  . feeding supplement (BOOST / RESOURCE BREEZE) liquid 1 Container  1 Container Oral TID  BM Florencia Reasons, MD   1 Container at 06/20/18 2237  . fludrocortisone (FLORINEF) tablet 0.1 mg  0.1 mg Oral Daily Florencia Reasons, MD   0.1 mg at 06/23/18 1008  . folic acid (FOLVITE) tablet 1 mg  1 mg Per Tube Daily [name], MD   1 mg at 06/23/18 1009  . insulin aspart (novoLOG) injection 0-15 Units  0-15 Units Subcutaneous Q4H [name], MD   2 Units at 06/23/18 1210  . levothyroxine (SYNTHROID, LEVOTHROID) tablet 100 mcg  100 mcg Oral QAC breakfast Karren Cobble, RPH   100 mcg at 06/23/18 5997  . loperamide (IMODIUM) capsule 4 mg  4 mg Oral BID Milus Banister, MD   4 mg at 06/23/18 1008  . MEDLINE mouth rinse  15 mL Mouth Rinse q12n4p [name], MD   15 mL at 06/23/18 1210  . metroNIDAZOLE (FLAGYL) tablet 500 mg  500 mg Oral Q8H Milus Banister, MD   500 mg at 06/23/18 0543  . multivitamin with minerals tablet 1 tablet  1 tablet Oral Daily [name], MD   1 tablet at 06/23/18 1008  . oxyCODONE-acetaminophen (PERCOCET/ROXICET) 5-325 MG per tablet 1 tablet  1 tablet Oral Q8H PRN Florencia Reasons, MD   1 tablet at 06/22/18 2139  . promethazine (PHENERGAN) injection 12.5 mg  12.5 mg Intravenous Q6H PRN [name], MD   12.5 mg at 06/14/18 0348  . tamsulosin (FLOMAX) capsule 0.4 mg  0.4 mg Oral Daily Florencia Reasons, MD   0.4 mg at 06/23/18 1009  . thiamine (VITAMIN B-1) tablet 100 mg  100 mg Oral Daily Karren Cobble, RPH   100 mg at 06/23/18 1009  . vitamin B-12 (CYANOCOBALAMIN) tablet 250 mcg  250 mcg Oral Daily [name], MD   250 mcg at 06/23/18 1008     Discharge Medications: Please see discharge summary for a list of discharge medications.  Relevant Imaging Results:  Relevant Lab Results:   Additional Information ss#[id number].  Weight 200 pounds  Sable Feil, LCSW

## 2018-06-23 NOTE — Clinical Social Work Note (Signed)
CSW talked with patient at the bedside regarding her discharge plan and the recommendation of ST rehab and she is in agreement. Deborah Russo was sitting up in bed and was alert and agreeable to talking with CSW. Patient is hard of hearing. CSW explained skilled facility search process and provided patient with SNF list. Deborah Russo reported that she, her sister Deborah Russo and her daughter Deborah Russo (age 59) live together. Her daughter has mental issues per patient and does not work and her sister was just approved for disability. Patient reported that they rent a house in [place]. Deborah Russo called shortly after beginning conversation with patient and she is in agreement with ST rehab and conversation ensued regarding what patient would need in terms of clothes and toiletries at the facility. When asked, Deborah Russo responded that she nor daughter drives. Patient advised that CSW will return to provide her with facility responses.   [name], MSW, LCSW Licensed Clinical Social Worker [place] [phone]

## 2018-06-23 NOTE — Progress Notes (Signed)
Nutrition Follow-up  DOCUMENTATION CODES:   Obesity unspecified  INTERVENTION:   Continue Boost Breeze po TID, each supplement provides 250 kcal and 9 grams of protein  Continue MVI  NUTRITION DIAGNOSIS:   Inadequate oral intake related to acute illness as evidenced by NPO status.  Being addressed via supplements, po improving  GOAL:   Patient will meet greater than or equal to 90% of their needs  Progressing  MONITOR:   TF tolerance, Vent status, Labs, Weight trends  REASON FOR ASSESSMENT:   Consult Assessment of nutrition requirement/status  ASSESSMENT:   57 yo female admitted with AMS, intubated for airway protection. Pt with hx of substance abuse, stroke, chronic pain, diarrhea  Appetite improving, recorded po intake 25-50% of meals. Pt eating lunch on visit today. Pt also reports she is drinking some Boost Breeze; again reports she does not like milky supplements  Stool less frequent, more formed. Rectal tube came out, per GI leave out  Labs: phosphorus 2.3 (supplemented), corrected calcium 9.24, albumin 2.2, potassium wdl, magnesium wdl, CBGs 105-125 Meds: questran, vitamin D, folic acid, MVI, imodium, thiamine, b-12  Diet Order:   Diet Order           DIET SOFT Room service appropriate? Yes; Fluid consistency: Thin  Diet effective now          EDUCATION NEEDS:   No education needs have been identified at this time  Skin:  Skin Assessment: Reviewed RN Assessment  Last BM:  7/5 stool improving, rectal tube out  Height:   Ht Readings from Last 1 Encounters:  06/11/18 5' 2"  (1.575 m)    Weight:   Wt Readings from Last 1 Encounters:  06/22/18 200 lb 2.8 oz (90.8 kg)    Ideal Body Weight:     BMI:  Body mass index is 36.61 kg/m.  Estimated Nutritional Needs:   Kcal:  9532-0233 kcals  Protein:  85-100 g  Fluid:  >/= 1.5 L   Kerman Passey MS, RD, LDN, CNSC [phone] Pager  [phone] Weekend/On-Call Pager

## 2018-06-23 NOTE — Discharge Summary (Signed)
Physician Discharge Summary  Deborah Russo CVE:[id number] DOB: 06/21/1960 DOA: 06/11/2018  PCP: Eustaquio Maize, MD  Admit date: 06/11/2018 Discharge date: 06/23/2018  Admitted From: Home Disposition: SNF  Recommendations for Outpatient Follow-up:  1. Follow up with PCP in 1-2 weeks 2. Follow up with Primary Gastroenterologist [name] as an outpatient as needed 3. Follow-up with [facility] pain clinic in outpatient setting for medication adjustments and reinitiation. 4. Up with cardiology as an outpatient for aortic atherosclerosis 5. Please obtain CMP/CBC, Mag, Phos in one week 6. Please follow up on the following pending results:  Home Health: No  Equipment/Devices: Conservation officer, nature with 5" Wheels; 3in1 PT  Discharge Condition: Stable CODE STATUS: FULL CODE Diet recommendation: Soft Diet  Brief/Interim Summary: Deborah N Conawayis an 57 y.o.female. She was admitted June 23 for altered mental status thought to be due to polysubstance abuse. Had a similar presentation one year ago. "Discharge summary from 1 year ago was reviewed when she presented with altered mental status. There was diagnosed with acute toxic encephalopathy thought to be due to polypharmacy with unintentional overdose. However it was also thought that she may have had a stroke at that time she refused further evaluation."  Per ED initial intake "From home- sister moved out a week ago, Called EMS for SOB, was sitting on front porch of filthy home per EMS"  Was also found to be hypotensive.Was intubated for airway protection and received fluid resuscitation. Reports long history of abdominal pain treated with narcotics, persistent nausea vomiting and profuse diarrhea. Reportedly had not been eating or drinking normally and taking none of her medication   She was extubated on June 24 and off pressors on 6/26 ,she is transferred to hospitalist service June 28. She underwent sigmoidoscope on June 28, continue  have ab pain and loose stool which is slowly improving. GI following and making recommendations recommended patient stay on Questran twice daily, Imodium twice daily, Lomotil 3 times daily, and complete a 5-day Flagyl course.  Patient to follow-up with her primary gastroenterologist in outpatient setting.  Patient was deemed medically stable by gastroenterology and recommended discharge so she will be discharged to skilled nursing facility for continued rehab and evaluation.  Discharge Diagnoses:  Active Problems:   Hypotension   Colitis   Cirrhosis of liver without ascites (HCC)  Pancolitis with profuse diarrhea, currently have a rectal tube , she c/o epigastric pain which is chronic by patient's report :  -Patient reports chronic Abdominal pain/ chronic diarrhea, she reports she is on pain meds for chronic abdominal pain -She reports has been having epigastric pain since she give birth to her daughter, epigastric pain get better with putting pressure on. -GI Pathogen pcr panel negative -protonixstopped per GI due to concerns of diarrhea side effect, she is onpepecid bid  -s/psigmoidoscope on 6/28 -GI following,will follow gi recommendations -[name] commending starting cholestyramine 4 g twice daily in addition to Imodium and Lomotil and also continuing a 5-day course of metronidazole -Flexiseal dislodge but stool consistency changing and not as liquidy/runny and ok to leave out per GI -GI recommending that she should stand Questran twice daily, Imodium twice daily, Lomotil 3 times daily, and complete Flagyl 5-day course (ends tomorrow) -GI also feels that her diffusely thickened colon is likely an indication of significant malnutrition and that diarrhea is slowly resolving -[name] recommending patient follow-up with her primary gastroneurologist [name] as needed as an outpatient  Electrolytes abnormalities:  Hypokalemia:k on presentation less than 2, improving,  but  remain low, continue to replace to keep k>4; K+ now 3.8 -Replete as necessary -Continue to Monitor and repeat CMP in AM   Hypomagnesemia:mag 1.3 on presentation, replaced, improving, keep replace to keep mag >2; Mag now 1.8 -Treat as necessary -Continue to monitor and repeat magnesium level  Hypocalcemia: calcium on presentation was 5.3, replaced much improved, calcium 7.8, albumin 2.2 -Repeat CMP at SNF  Hypophosphatemia:phos on presentation is less than 1; Now is 2.3 -Replete prior to D/C  -Repeat Phos level at SNF   Encephalopathy, improved  -Ct head no acute findings, 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 ( perc chart review she was hospitalized for left mca stroke in 03/2017) -C/w supportive care , Improving, she is AAOX3 since 7/2 -start PT, she reports she uses a walker for many years at baseline -PT/OT Recommending SNF and agreeable.  Macrocytic Anemia -mcv 107 on presentation -hgb around 9-10 ( hgb a year ago around 13-14) -Hemoglobin/hematocrit went from 9.3/27.9 and was down to 7.2/21.7 -Checked FOBT and was + -Continue to monitor for signs and symptoms of bleeding -Checked Anemia Panel and showed an iron level 45, TIBC of 59, TIBC of 104, saturation ratio 43%, ferritin level 309, folate level 13.6, and vitamin B12 523 -Typed and Screen and Transfuse 1 unit of pRBCs and improved to 8.9/26.3 after transfusion -[facility] states is okay to leave at the Flexi-Seal as follow along with use can lead to rectal irritation with hospitalization because FOBT positive as she has no gross bleeding -They recommend also stop getting hemoglobin hematocrit 6 hours -Recommend following up with CBC in outpatient setting at skilled nursing facility  Hypothyroidism: -TSH on presentation was 7 -She is started on Synthroid 100 mcg po Daily ( per chart review she has a long history of noncompliance with meds) and will continue current dose  Adrenal insufficiency:From  long term opioids use? -Random cortisol 13, no increase after cosyntropin administration. -Ct ab "bilateral adrenal glands are normal in appearance." -Started onFludrocortisone 0.1 mg daily  Low B12 -Started on supplementation and will continue 250 mcg po Daily   Thrombocytopenia -Platelet Count on presentation was 78, ( a year ago was 117-146 per chart reveiw) -CT ab showed "Hepatic steatosiswith evidence of cirrhosis." With trace ascites, spleen unremarkable -Platelet Count improving and is now 123 -Repeat CBC as an outpatient   Acute Urinary Retention She has a foley placed on 6/22 due to urinary retention, foley removed on 6/28, repeat bladder scan on 6/29, again urinary retentionof 643cc, reinsert foley -starttrial offlomax, start Physical therapy, -Follow up with Urology for TOV and Foley Catheter Removal   Aortic atherosclerosis -Including high-grade stenosis or occlusion of the left common iliac artery.with distal reconstitution of flow related to collateral Pathways On CT Adominal and Pelvis  -Follow up as an outpatient   Obesity -Body mass index is 37.68 kg/m. -Weight Loss Counseling given  Tobacco Abuse/Smoker -Smoking cessation education provided.  Chronic pain (epigastric pain)  -[facility] -She reports she has not followed up with pain clinic for several months if not longer.  -She reports she run of pain meds a several weeks ago, not sure if story reliable  -Reportedly was on 80 mg of oxycodone through 12 and had oxycodone-acetaminophen 10-325 mg for breakthrough along with diazepam 5 mg as needed and baclofen -Being on these high-dose pain medications back contributed to her being found on the floor -Hold all these and continue oxycodone-acetaminophen 5-3 25 as written for discharge -  Follow up with Pain Clinic as an outpatient for further medication adjustments  Generalized Weakness/Deconditioningy -PT/OT  recommending SNF and will D/C today  Discharge Instructions Discharge Instructions    Call MD for:  difficulty breathing, headache or visual disturbances   Complete by:  As directed    Call MD for:  extreme fatigue   Complete by:  As directed    Call MD for:  hives   Complete by:  As directed    Call MD for:  persistant dizziness or light-headedness   Complete by:  As directed    Call MD for:  persistant nausea and vomiting   Complete by:  As directed    Call MD for:  redness, tenderness, or signs of infection (pain, swelling, redness, odor or green/yellow discharge around incision site)   Complete by:  As directed    Call MD for:  severe uncontrolled pain   Complete by:  As directed    Call MD for:  temperature >100.4   Complete by:  As directed    Diet - low sodium heart healthy   Complete by:  As directed    SOFT DIET   Discharge instructions   Complete by:  As directed    Follow-up with primary care physician and gastroenterologist in outpatient setting.  Take all medications as prescribed.  If symptoms change or worsen please return the emergency room for evaluation.   Increase activity slowly   Complete by:  As directed      Allergies as of 06/23/2018      Reactions   Penicillins Hives   *Tolerates zosyn Has patient had a PCN reaction causing immediate rash, facial/tongue/throat swelling, SOB or lightheadedness with hypotension: No Has patient had a PCN reaction causing severe rash involving mucus membranes or skin necrosis: No Has patient had a PCN reaction that required hospitalization No Has patient had a PCN reaction occurring within the last 10 years: No If all of the above answers are "NO", then may proceed with Cephalosporin use.   Sulfa Antibiotics Hives      Medication List    STOP taking these medications   amitriptyline 10 MG tablet Commonly known as:  ELAVIL   baclofen 10 MG tablet Commonly known as:  LIORESAL   diazepam 5 MG tablet Commonly known  as:  VALIUM   lisinopril 10 MG tablet Commonly known as:  PRINIVIL,ZESTRIL   omeprazole 20 MG capsule Commonly known as:  PRILOSEC   oxyCODONE 80 mg 12 hr tablet Commonly known as:  OXYCONTIN   oxyCODONE-acetaminophen 10-325 MG tablet Commonly known as:  PERCOCET Replaced by:  oxyCODONE-acetaminophen 5-325 MG tablet   pantoprazole 20 MG tablet Commonly known as:  PROTONIX   senna-docusate 8.6-50 MG tablet Commonly known as:  Senokot-S   sucralfate 1 g tablet Commonly known as:  CARAFATE     TAKE these medications   aspirin 325 MG tablet Take 1 tablet (325 mg total) by mouth daily.   Cholecalciferol 1000 units tablet Take 1 tablet (1,000 Units total) by mouth daily. Start taking on:  06/24/2018   cholestyramine 4 g packet Commonly known as:  QUESTRAN Take 1 packet (4 g total) by mouth 2 (two) times daily.   diphenoxylate-atropine 2.5-0.025 MG tablet Commonly known as:  LOMOTIL Take 1 tablet by mouth 3 (three) times daily.   famotidine 20 MG tablet Commonly known as:  PEPCID Take 1 tablet (20 mg total) by mouth 2 (two) times daily.   fludrocortisone 0.1 MG tablet  Commonly known as:  FLORINEF Take 1 tablet (0.1 mg total) by mouth daily. Start taking on:  08/26/2951   folic acid 1 MG tablet Commonly known as:  FOLVITE Place 1 tablet (1 mg total) into feeding tube daily. Start taking on:  06/24/2018   levothyroxine 100 MCG tablet Commonly known as:  SYNTHROID, LEVOTHROID Take 1 tablet (100 mcg total) by mouth daily before breakfast. Start taking on:  06/24/2018 What changed:    medication strength  how much to take  when to take this   loperamide 2 MG capsule Commonly known as:  IMODIUM Take 2 capsules (4 mg total) by mouth 2 (two) times daily.   metroNIDAZOLE 500 MG tablet Commonly known as:  FLAGYL Take 1 tablet (500 mg total) by mouth every 8 (eight) hours for 1 day.   multivitamin with minerals Tabs tablet Take 1 tablet by mouth daily. Start taking  on:  06/24/2018   oxyCODONE-acetaminophen 5-325 MG tablet Commonly known as:  PERCOCET/ROXICET Take 1 tablet by mouth every 8 (eight) hours as needed for severe pain. Replaces:  oxyCODONE-acetaminophen 10-325 MG tablet   pravastatin 20 MG tablet Commonly known as:  PRAVACHOL TAKE 1 TABLET DAILY   tamsulosin 0.4 MG Caps capsule Commonly known as:  FLOMAX Take 1 capsule (0.4 mg total) by mouth daily. Start taking on:  06/24/2018   thiamine 100 MG tablet Take 1 tablet (100 mg total) by mouth daily. Start taking on:  06/24/2018   vitamin B-12 250 MCG tablet Commonly known as:  CYANOCOBALAMIN Take 1 tablet (250 mcg total) by mouth daily. Start taking on:  06/24/2018      Follow-up Information    Rehman, Mechele Dawley, MD Follow up.   Specialty:  Gastroenterology Why:  contact office for follow up of diarrhea, abdominal pain Contact information: [address], [address] [place] [address] [phone]        Eustaquio Maize, MD. Call.   Specialty:  Pediatrics Why:  Follow up in 1 week  Contact information: [place] [address] [phone]          Allergies  Allergen Reactions  . Penicillins Hives    *Tolerates zosyn Has patient had a PCN reaction causing immediate rash, facial/tongue/throat swelling, SOB or lightheadedness with hypotension: No Has patient had a PCN reaction causing severe rash involving mucus membranes or skin necrosis: No Has patient had a PCN reaction that required hospitalization No Has patient had a PCN reaction occurring within the last 10 years: No If all of the above answers are "NO", then may proceed with Cephalosporin use.   Ignacia Bayley Antibiotics Hives   Consultations:  PCCM Transfer  Gastroenterology  Procedures/Studies: Ct Head Wo Contrast  Result Date: 06/11/2018 CLINICAL DATA:  Altered mental status. EXAM: CT HEAD WITHOUT CONTRAST TECHNIQUE: Contiguous axial images were obtained from the base of the skull through the  vertex without intravenous contrast. COMPARISON:  01/23/2018 FINDINGS: Brain: No evidence of acute infarction, hemorrhage, hydrocephalus, extra-axial collection or mass lesion/mass effect. There is sulcal enlargement consistent with mild atrophy. Old infarct is noted at the junction of the posterior left frontal and left parietal lobe superiorly, stable from the prior exam. Vascular: No hyperdense vessel or unexpected calcification. Skull: Normal. Negative for fracture or focal lesion. Sinuses/Orbits: Visualized globes and orbits are unremarkable. The visualized sinuses and mastoid air cells are clear. Other: None. IMPRESSION: 1. No acute intracranial abnormalities. 2. Mild atrophy.  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 Electronically  Signed   By: Lajean Manes M.D.   On: 06/11/2018 12:51   Ct Abdomen Pelvis W Contrast  Result Date: 06/11/2018 CLINICAL DATA:  57 year old female with history of altered mental status. Possible abdominal mass noted on physical examination. EXAM: CT ABDOMEN AND PELVIS WITH CONTRAST TECHNIQUE: Multidetector CT imaging of the abdomen and pelvis was performed using the standard protocol following bolus administration of intravenous contrast. CONTRAST:  196m ISOVUE-300 IOPAMIDOL (ISOVUE-300) INJECTION 61% COMPARISON:  CT the abdomen and pelvis 07/13/2017. FINDINGS: Lower chest: Unremarkable. Hepatobiliary: Diffuse low attenuation throughout the hepatic parenchyma, indicative of severe hepatic steatosis. Liver also has a shrunken appearance and nodular contour, indicative of a background of cirrhosis. No definite cystic or solid hepatic lesions. No intra or extrahepatic biliary ductal dilatation. Small amount of pneumobilia in the left lobe of the liver, indicative of prior sphincterotomy. Status post cholecystectomy. Pancreas: No pancreatic mass. No pancreatic ductal dilatation. No pancreatic or peripancreatic fluid or inflammatory changes. Spleen: Unremarkable. Adrenals/Urinary  Tract: Bilateral kidneys and bilateral adrenal glands are normal in appearance. No hydroureteronephrosis. Foley balloon catheter present in the lumen of the urinary bladder. Urinary bladder is nearly decompressed, but otherwise unremarkable in appearance. Stomach/Bowel: Normal appearance of the stomach. No pathologic dilatation of small bowel or colon. Severe mural thickening throughout the entire colon and rectum with surrounding inflammatory changes in the adjacent mesocolon and mesorectum, indicative of severe pancolitis. Normal appendix. Vascular/Lymphatic: Aortic atherosclerosis, without evidence of aneurysm or dissection in the abdominal or pelvic vasculature. High-grade stenosis or complete occlusion of the left common iliac artery with distal reconstitution of flow related to collateral pathways. No lymphadenopathy noted in the abdomen or pelvis. Reproductive: Status post hysterectomy. Ovaries are not confidently identified may be surgically absent or atrophic. Other: Trace volume of ascites.  No pneumoperitoneum. Musculoskeletal: Chronic compression fracture of L4 with approximately 20% loss of anterior vertebral body height, unchanged. There are no aggressive appearing lytic or blastic lesions noted in the visualized portions of the skeleton. IMPRESSION: 1. Severe pancolitis. Clinical correlation for signs and symptoms of C difficile colitis is suggested. 2. Hepatic steatosis with evidence of cirrhosis. 3. Aortic atherosclerosis, including high-grade stenosis or occlusion of the left common iliac artery. 4. Additional incidental findings, as above. Electronically Signed   By: DVinnie LangtonM.D.   On: 06/11/2018 12:36   Dg Chest Port 1 View  Result Date: 06/11/2018 CLINICAL DATA:  Intubated. EXAM: PORTABLE CHEST 1 VIEW COMPARISON:  Earlier today. FINDINGS: The endotracheal tube has been advanced with its tip 5 mm above the carina. Nasogastric tube extending into the stomach. Mildly improved  enlargement of the cardiac silhouette. Decreased prominence of the pulmonary vasculature and interstitial markings. Linear and small amount of ill-defined density at the left lung base. Diffuse osteopenia. IMPRESSION: 1. Endotracheal tube tip 5 mm above the carina. It is recommended that this be retracted 4 cm. 2. Improved cardiomegaly and changes of congestive heart failure. 3. Left basilar atelectasis and possible small amount of pleural fluid. Electronically Signed   By: SClaudie ReveringM.D.   On: 06/11/2018 19:19   Dg Chest Portable 1 View  Result Date: 06/11/2018 CLINICAL DATA:  57year old female status post endotracheal tube placement. Orogastric tube placement. EXAM: PORTABLE CHEST 1 VIEW COMPARISON:  Chest x-ray 06/11/2018. FINDINGS: An endotracheal tube is in place with tip 1.7 cm above the carina. A nasogastric tube is seen extending into the stomach, however, the tip of the nasogastric tube extends below the lower margin of the image.  Low lung volumes. No consolidative airspace disease. There is cephalization of the pulmonary vasculature and slight indistinctness of the interstitial markings suggestive of mild pulmonary edema. No pleural effusions. Mild cardiomegaly. Upper mediastinal contours are within normal limits. Aortic atherosclerosis. IMPRESSION: 1. Support apparatus, as above. 2. The appearance the chest suggests mild congestive heart failure, as above. 3. Aortic atherosclerosis. Electronically Signed   By: Vinnie Langton M.D.   On: 06/11/2018 16:37   Dg Chest Port 1 View  Result Date: 06/11/2018 CLINICAL DATA:  57 year old female with history of altered mental status. Code sepsis. EXAM: PORTABLE CHEST 1 VIEW COMPARISON:  Chest x-ray 01/22/2018. FINDINGS: There is cephalization of the pulmonary vasculature and slight indistinctness of the interstitial markings suggestive of mild pulmonary edema. No pleural effusions. Mild cardiomegaly. Upper mediastinal contours are within normal limits.  Aortic atherosclerosis. IMPRESSION: 1. The appearance the chest may suggest mild congestive heart failure. 2. Aortic atherosclerosis. Electronically Signed   By: Vinnie Langton M.D.   On: 06/11/2018 12:28   Dg Abd Portable 2v  Result Date: 06/15/2018 CLINICAL DATA:  Abdominal pain and distention.  Colitis. EXAM: PORTABLE ABDOMEN - 2 VIEW COMPARISON:  06/11/2018 CT abdomen/pelvis FINDINGS: Moderate gaseous distention of the stomach with gastric fluid level. No disproportionately dilated small bowel loops. Mild-to-moderate gas in colon, most prominent in the right colon. No evidence of pneumatosis or pneumoperitoneum. Cholecystectomy clips are seen in the right upper quadrant of the abdomen. No radiopaque nephrolithiasis. Foley catheter terminates over the location of the bladder. IMPRESSION: Moderately distended stomach with gastric air-fluid level. Mild-to-moderate colonic gas. No evidence of small-bowel obstruction. No free air or pneumatosis. Electronically Signed   By: Ilona Sorrel M.D.   On: 06/15/2018 21:14   ECHOCARDIOGRAM ------------------------------------------------------------------- Study Conclusions  - Left ventricle: The cavity size was normal. There was moderate   concentric hypertrophy. Systolic function was hyperdynamic. The   estimated ejection fraction was in the range of 70% to 75%. There   was dynamic obstruction at restin the mid cavity. Wall motion was   normal; there were no regional wall motion abnormalities. Doppler   parameters are consistent with abnormal left ventricular   relaxation (grade 1 diastolic dysfunction). Doppler parameters   are consistent with elevated mean left atrial filling pressure. - Aortic valve: Valve area (VTI): 2.53 cm^2. Valve area (Vmax):   3.21 cm^2. Valve area (Vmean): 2.52 cm^2.  Subjective: Seen and examined at bedside and states she still has some mild abdominal pain.  Diarrhea is improving and did not realize her Flexi-Seal is out.   No chest pain, lightheadedness or dizziness.  No other complaints or concerns at this time and felt better.  Discharge Exam: Vitals:   06/23/18 0432 06/23/18 0727  BP: 108/64 135/73  Pulse: 68 71  Resp: 16 18  Temp: 98.1 F (36.7 C) 98.3 F (36.8 C)  SpO2:  99%   Vitals:   06/22/18 2122 06/22/18 2356 06/23/18 0432 06/23/18 0727  BP: (!) 109/52 110/60 108/64 135/73  Pulse: 76 76 68 71  Resp: 20 20 16 18   Temp:  98.6 F (37 C) 98.1 F (36.7 C) 98.3 F (36.8 C)  TempSrc:  Oral Oral Oral  SpO2: 97% 98%  99%  Weight:      Height:       General: Pt is alert, awake, not in acute distress Cardiovascular: RRR, S1/S2 +, no rubs, no gallops Respiratory: Diminished bilaterally, no wheezing, no rhonchi Abdominal: Soft, Slightly tender, ND, bowel sounds + Extremities: no  edema, no cyanosis  The results of significant diagnostics from this hospitalization (including imaging, microbiology, ancillary and laboratory) are listed below for reference.    Microbiology: Recent Results (from the past 240 hour(s))  C Difficile Quick Screen w PCR reflex     Status: None   Collection Time: 06/13/18  4:19 PM  Result Value Ref Range Status   C Diff antigen NEGATIVE NEGATIVE Final   C Diff toxin NEGATIVE NEGATIVE Final   C Diff interpretation No C. difficile detected.  Final    Comment: Performed at [facility], [place] [address], [place], [place] [address]  OVA + PARASITE EXAM     Status: None   Collection Time: 06/13/18  7:12 PM  Result Value Ref Range Status   OVA + PARASITE EXAM Final report  Final    Comment: (NOTE) These results were obtained using wet preparation(s) and trichrome stained smear. This test does not include testing for Cryptosporidium parvum, Cyclospora, or Microsporidia. Performed At: [place], [place] [id number] Elwanda Brooklyn R MD [id number]    Source of Sample STOOL  Final  Calprotectin, Fecal     Status: None   Collection  Time: 06/13/18  7:12 PM  Result Value Ref Range Status   Calprotectin, Fecal 39 0 - 120 ug/g Final    Comment: (NOTE) Concentration     Interpretation   Follow-Up <16 - 50 ug/g     Normal           None >50 -120 ug/g     Borderline       Re-evaluate in 4-6 weeks    >120 ug/g     Abnormal         Repeat as clinically                                   indicated Performed At: [facility] [address] [place], [place] [id number] Rush Farmer MD [id number]   Gastrointestinal Panel by PCR , Stool     Status: None   Collection Time: 06/15/18 12:43 PM  Result Value Ref Range Status   Campylobacter species NOT DETECTED NOT DETECTED Final   Plesimonas shigelloides NOT DETECTED NOT DETECTED Final   Salmonella species NOT DETECTED NOT DETECTED Final   Yersinia enterocolitica NOT DETECTED NOT DETECTED Final   Vibrio species NOT DETECTED NOT DETECTED Final   Vibrio cholerae NOT DETECTED NOT DETECTED Final   Enteroaggregative E coli (EAEC) NOT DETECTED NOT DETECTED Final   Enteropathogenic E coli (EPEC) NOT DETECTED NOT DETECTED Final   Enterotoxigenic E coli (ETEC) NOT DETECTED NOT DETECTED Final   Shiga like toxin producing E coli (STEC) NOT DETECTED NOT DETECTED Final   Shigella/Enteroinvasive E coli (EIEC) NOT DETECTED NOT DETECTED Final   Cryptosporidium NOT DETECTED NOT DETECTED Final   Cyclospora cayetanensis NOT DETECTED NOT DETECTED Final   Entamoeba histolytica NOT DETECTED NOT DETECTED Final   Giardia lamblia NOT DETECTED NOT DETECTED Final   Adenovirus F40/41 NOT DETECTED NOT DETECTED Final   Astrovirus NOT DETECTED NOT DETECTED Final   Norovirus GI/GII NOT DETECTED NOT DETECTED Final   Rotavirus A NOT DETECTED NOT DETECTED Final   Sapovirus (I, II, IV, and V) NOT DETECTED NOT DETECTED Final    Comment: Performed at [facility], [address] [place], [place] [address]    Labs: BNP (last 3 results) Recent Labs  06/11/18 2114  BNP 226.2*   Basic  Metabolic Panel: Recent Labs  Lab 06/19/18 0529 06/19/18 1835 06/20/18 0651 06/21/18 0450 06/21/18 0838 06/21/18 1817 06/22/18 0353 06/23/18 0752  NA 134*  --  134* 135  --   --  137 138  K 3.0*  --  3.8 2.8*  --   --  3.4* 3.8  CL 103  --  106 106  --   --  108 108  CO2 24  --  21* 23  --   --  24 24  GLUCOSE 127*  --  122* 130*  --   --  109* 121*  BUN 7  --  6 7  --   --  7 7  CREATININE 1.10*  --  1.09* 1.01*  --   --  0.98 0.91  CALCIUM 7.6*  --  7.9* 7.6*  --   --  7.6* 7.8*  MG  --  2.1  --   --  1.9 1.8 1.8 1.8  PHOS 2.4*  --  2.4*  --  2.4*  --  3.0 2.3*   Liver Function Tests: Recent Labs  Lab 06/18/18 0649 06/22/18 0353 06/23/18 0752  AST 15 23 21   ALT 11 13 13   ALKPHOS 77 108 117  BILITOT 0.5 0.6 0.7  PROT 4.7* 5.1* 5.3*  ALBUMIN 2.1* 2.1* 2.2*   No results for input(s): LIPASE, AMYLASE in the last 168 hours. Recent Labs  Lab 06/17/18 0810  AMMONIA 43*   CBC: Recent Labs  Lab 06/19/18 1835 06/21/18 0838 06/21/18 1817 06/22/18 0353 06/22/18 1524 06/22/18 1954 06/23/18 0752  WBC 6.5 5.0 4.4 4.4  --   --  3.9*  NEUTROABS  --  2.6  --  2.4  --   --  2.1  HGB 9.3* 8.1* 7.6* 7.3* 7.2* 7.5* 8.9*  HCT 27.9* 24.1* 22.5* 21.8* 21.7* 22.4* 26.3*  MCV 111.2* 107.6* 109.2* 108.5*  --   --  104.4*  PLT 87* 114* 113* 113*  --   --  123*   Cardiac Enzymes: No results for input(s): CKTOTAL, CKMB, CKMBINDEX, TROPONINI in the last 168 hours. BNP: Invalid input(s): POCBNP CBG: Recent Labs  Lab 06/22/18 2020 06/23/18 0044 06/23/18 0431 06/23/18 0725 06/23/18 1151  GLUCAP 125* 105* 111* 116* 129*   D-Dimer No results for input(s): DDIMER in the last 72 hours. Hgb A1c No results for input(s): HGBA1C in the last 72 hours. Lipid Profile No results for input(s): CHOL, HDL, LDLCALC, TRIG, CHOLHDL, LDLDIRECT in the last 72 hours. Thyroid function studies No results for input(s): TSH, T4TOTAL, T3FREE, THYROIDAB in the last 72 hours.  Invalid input(s):  FREET3 Anemia work up Recent Labs    06/22/18 0353  VITAMINB12 523  FOLATE 13.6  FERRITIN 309*  TIBC 104*  IRON 45  RETICCTPCT 4.5*   Urinalysis    Component Value Date/Time   COLORURINE YELLOW 06/11/2018 [place] 06/11/2018 1136   LABSPEC 1.009 06/11/2018 1136   PHURINE 7.0 06/11/2018 1136   GLUCOSEU NEGATIVE 06/11/2018 1136   HGBUR MODERATE (A) 06/11/2018 1136   BILIRUBINUR NEGATIVE 06/11/2018 1136   KETONESUR NEGATIVE 06/11/2018 1136   PROTEINUR NEGATIVE 06/11/2018 1136   UROBILINOGEN 0.2 09/02/2015 1735   NITRITE NEGATIVE 06/11/2018 1136   LEUKOCYTESUR NEGATIVE 06/11/2018 1136   Sepsis Labs Invalid input(s): PROCALCITONIN,  WBC,  LACTICIDVEN Microbiology Recent Results (from the past 240 hour(s))  C Difficile Quick Screen w PCR reflex  Status: None   Collection Time: 06/13/18  4:19 PM  Result Value Ref Range Status   C Diff antigen NEGATIVE NEGATIVE Final   C Diff toxin NEGATIVE NEGATIVE Final   C Diff interpretation No C. difficile detected.  Final    Comment: Performed at [facility], [place] [address], [place], [place] [address]  OVA + PARASITE EXAM     Status: None   Collection Time: 06/13/18  7:12 PM  Result Value Ref Range Status   OVA + PARASITE EXAM Final report  Final    Comment: (NOTE) These results were obtained using wet preparation(s) and trichrome stained smear. This test does not include testing for Cryptosporidium parvum, Cyclospora, or Microsporidia. Performed At: [place], [place] [id number] Elwanda Brooklyn R MD [id number]    Source of Sample STOOL  Final  Calprotectin, Fecal     Status: None   Collection Time: 06/13/18  7:12 PM  Result Value Ref Range Status   Calprotectin, Fecal 39 0 - 120 ug/g Final    Comment: (NOTE) Concentration     Interpretation   Follow-Up <16 - 50 ug/g     Normal           None >50 -120 ug/g     Borderline       Re-evaluate in 4-6 weeks    >120 ug/g      Abnormal         Repeat as clinically                                   indicated Performed At: [facility] [address] [place], [place] [id number] Rush Farmer MD LX:[id number]   Gastrointestinal Panel by PCR , Stool     Status: None   Collection Time: 06/15/18 12:43 PM  Result Value Ref Range Status   Campylobacter species NOT DETECTED NOT DETECTED Final   Plesimonas shigelloides NOT DETECTED NOT DETECTED Final   Salmonella species NOT DETECTED NOT DETECTED Final   Yersinia enterocolitica NOT DETECTED NOT DETECTED Final   Vibrio species NOT DETECTED NOT DETECTED Final   Vibrio cholerae NOT DETECTED NOT DETECTED Final   Enteroaggregative E coli (EAEC) NOT DETECTED NOT DETECTED Final   Enteropathogenic E coli (EPEC) NOT DETECTED NOT DETECTED Final   Enterotoxigenic E coli (ETEC) NOT DETECTED NOT DETECTED Final   Shiga like toxin producing E coli (STEC) NOT DETECTED NOT DETECTED Final   Shigella/Enteroinvasive E coli (EIEC) NOT DETECTED NOT DETECTED Final   Cryptosporidium NOT DETECTED NOT DETECTED Final   Cyclospora cayetanensis NOT DETECTED NOT DETECTED Final   Entamoeba histolytica NOT DETECTED NOT DETECTED Final   Giardia lamblia NOT DETECTED NOT DETECTED Final   Adenovirus F40/41 NOT DETECTED NOT DETECTED Final   Astrovirus NOT DETECTED NOT DETECTED Final   Norovirus GI/GII NOT DETECTED NOT DETECTED Final   Rotavirus A NOT DETECTED NOT DETECTED Final   Sapovirus (I, II, IV, and V) NOT DETECTED NOT DETECTED Final    Comment: Performed at [facility], [address], [place], [place] [address]   Time coordinating discharge: 35 minutes  SIGNED:  Kerney Elbe, DO [facility] 06/23/2018, 3:40 PM Pager [phone]  If 7PM-7AM, please contact night-coverage [email or web address] Password [email or web address]

## 2018-06-23 NOTE — Progress Notes (Signed)
Patient is still in the hospital awaiting for a EMS to pick her up. Denied any pain. No acute respiratory distress noted. Will continue to monitor.

## 2018-06-23 NOTE — Progress Notes (Signed)
Patient was very hesitate about going to SNF, but has agreed to go to [facility] for acute rehab. Patient's family has been made aware of where she is going. IV has been taken out, and AVS has been given to patient. Patient belongings are with patient. [facility] will pick patient up and take her to SNF. Report has been called and given to Colletta Maryland, Therapist, sports at [facility]. Will continue to monitor patient.   Farley Ly RN

## 2018-06-24 DIAGNOSIS — L821 Other seborrheic keratosis: Secondary | ICD-10-CM | POA: Diagnosis not present

## 2018-06-24 DIAGNOSIS — I959 Hypotension, unspecified: Secondary | ICD-10-CM | POA: Diagnosis not present

## 2018-06-24 DIAGNOSIS — E7800 Pure hypercholesterolemia, unspecified: Secondary | ICD-10-CM | POA: Diagnosis not present

## 2018-06-24 DIAGNOSIS — E876 Hypokalemia: Secondary | ICD-10-CM | POA: Diagnosis not present

## 2018-06-24 DIAGNOSIS — J069 Acute upper respiratory infection, unspecified: Secondary | ICD-10-CM | POA: Diagnosis not present

## 2018-06-24 DIAGNOSIS — A419 Sepsis, unspecified organism: Secondary | ICD-10-CM | POA: Diagnosis not present

## 2018-06-24 DIAGNOSIS — R41840 Attention and concentration deficit: Secondary | ICD-10-CM | POA: Diagnosis not present

## 2018-06-24 DIAGNOSIS — K7031 Alcoholic cirrhosis of liver with ascites: Secondary | ICD-10-CM | POA: Diagnosis not present

## 2018-06-24 DIAGNOSIS — R41844 Frontal lobe and executive function deficit: Secondary | ICD-10-CM | POA: Diagnosis not present

## 2018-06-24 DIAGNOSIS — F411 Generalized anxiety disorder: Secondary | ICD-10-CM | POA: Diagnosis not present

## 2018-06-24 DIAGNOSIS — G43909 Migraine, unspecified, not intractable, without status migrainosus: Secondary | ICD-10-CM | POA: Diagnosis not present

## 2018-06-24 DIAGNOSIS — Z79899 Other long term (current) drug therapy: Secondary | ICD-10-CM | POA: Diagnosis not present

## 2018-06-24 DIAGNOSIS — F17200 Nicotine dependence, unspecified, uncomplicated: Secondary | ICD-10-CM | POA: Diagnosis not present

## 2018-06-24 DIAGNOSIS — N139 Obstructive and reflux uropathy, unspecified: Secondary | ICD-10-CM | POA: Diagnosis not present

## 2018-06-24 DIAGNOSIS — R4182 Altered mental status, unspecified: Secondary | ICD-10-CM | POA: Diagnosis not present

## 2018-06-24 DIAGNOSIS — I69391 Dysphagia following cerebral infarction: Secondary | ICD-10-CM | POA: Diagnosis not present

## 2018-06-24 DIAGNOSIS — G894 Chronic pain syndrome: Secondary | ICD-10-CM | POA: Diagnosis not present

## 2018-06-24 DIAGNOSIS — L720 Epidermal cyst: Secondary | ICD-10-CM | POA: Diagnosis not present

## 2018-06-24 DIAGNOSIS — I10 Essential (primary) hypertension: Secondary | ICD-10-CM | POA: Diagnosis not present

## 2018-06-24 DIAGNOSIS — D225 Melanocytic nevi of trunk: Secondary | ICD-10-CM | POA: Diagnosis not present

## 2018-06-24 DIAGNOSIS — D688 Other specified coagulation defects: Secondary | ICD-10-CM | POA: Diagnosis not present

## 2018-06-24 DIAGNOSIS — R221 Localized swelling, mass and lump, neck: Secondary | ICD-10-CM | POA: Diagnosis not present

## 2018-06-24 DIAGNOSIS — E559 Vitamin D deficiency, unspecified: Secondary | ICD-10-CM | POA: Diagnosis not present

## 2018-06-24 DIAGNOSIS — D649 Anemia, unspecified: Secondary | ICD-10-CM | POA: Diagnosis not present

## 2018-06-24 DIAGNOSIS — E782 Mixed hyperlipidemia: Secondary | ICD-10-CM | POA: Diagnosis not present

## 2018-06-24 DIAGNOSIS — Z720 Tobacco use: Secondary | ICD-10-CM | POA: Diagnosis not present

## 2018-06-24 DIAGNOSIS — K529 Noninfective gastroenteritis and colitis, unspecified: Secondary | ICD-10-CM | POA: Diagnosis not present

## 2018-06-24 DIAGNOSIS — R3914 Feeling of incomplete bladder emptying: Secondary | ICD-10-CM | POA: Diagnosis not present

## 2018-06-24 DIAGNOSIS — R1311 Dysphagia, oral phase: Secondary | ICD-10-CM | POA: Diagnosis not present

## 2018-06-24 DIAGNOSIS — M6281 Muscle weakness (generalized): Secondary | ICD-10-CM | POA: Diagnosis not present

## 2018-06-24 DIAGNOSIS — D518 Other vitamin B12 deficiency anemias: Secondary | ICD-10-CM | POA: Diagnosis not present

## 2018-06-24 DIAGNOSIS — E119 Type 2 diabetes mellitus without complications: Secondary | ICD-10-CM | POA: Diagnosis not present

## 2018-06-24 DIAGNOSIS — Z743 Need for continuous supervision: Secondary | ICD-10-CM | POA: Diagnosis not present

## 2018-06-24 DIAGNOSIS — R21 Rash and other nonspecific skin eruption: Secondary | ICD-10-CM | POA: Diagnosis not present

## 2018-06-24 DIAGNOSIS — I639 Cerebral infarction, unspecified: Secondary | ICD-10-CM | POA: Diagnosis not present

## 2018-06-24 DIAGNOSIS — R279 Unspecified lack of coordination: Secondary | ICD-10-CM | POA: Diagnosis not present

## 2018-06-24 DIAGNOSIS — E039 Hypothyroidism, unspecified: Secondary | ICD-10-CM | POA: Diagnosis not present

## 2018-06-24 LAB — GLUCOSE, CAPILLARY
Glucose-Capillary: 107 mg/dL — ABNORMAL HIGH (ref 70–99)
Glucose-Capillary: 122 mg/dL — ABNORMAL HIGH (ref 70–99)

## 2018-06-24 NOTE — Progress Notes (Signed)
Patient left the unit accompanied by 2 EMS personnel. Denied any acute pain. No respiratory distress noted Foley catheter intact.  No IV access.

## 2018-06-24 NOTE — Progress Notes (Signed)
[facility] here to take patient to [facility].

## 2018-06-27 DIAGNOSIS — I959 Hypotension, unspecified: Secondary | ICD-10-CM | POA: Diagnosis not present

## 2018-06-27 DIAGNOSIS — K7031 Alcoholic cirrhosis of liver with ascites: Secondary | ICD-10-CM | POA: Diagnosis not present

## 2018-06-27 DIAGNOSIS — R4182 Altered mental status, unspecified: Secondary | ICD-10-CM | POA: Diagnosis not present

## 2018-06-29 DIAGNOSIS — R3914 Feeling of incomplete bladder emptying: Secondary | ICD-10-CM | POA: Diagnosis not present

## 2018-07-07 ENCOUNTER — Encounter: Payer: Self-pay | Admitting: "Endocrinology

## 2018-07-17 DIAGNOSIS — R3914 Feeling of incomplete bladder emptying: Secondary | ICD-10-CM | POA: Diagnosis not present

## 2018-07-18 LAB — VASOACTIVE INTESTINAL PEPTIDE (VIP): Vasoactive Intest Polypeptide: 95.6 pg/mL — ABNORMAL HIGH (ref 0.0–58.8)

## 2018-07-26 DIAGNOSIS — E039 Hypothyroidism, unspecified: Secondary | ICD-10-CM | POA: Diagnosis not present

## 2018-07-26 DIAGNOSIS — K7031 Alcoholic cirrhosis of liver with ascites: Secondary | ICD-10-CM | POA: Diagnosis not present

## 2018-07-26 DIAGNOSIS — E876 Hypokalemia: Secondary | ICD-10-CM | POA: Diagnosis not present

## 2018-07-26 DIAGNOSIS — F411 Generalized anxiety disorder: Secondary | ICD-10-CM | POA: Diagnosis not present

## 2018-08-02 DIAGNOSIS — I639 Cerebral infarction, unspecified: Secondary | ICD-10-CM | POA: Diagnosis not present

## 2018-08-02 DIAGNOSIS — D649 Anemia, unspecified: Secondary | ICD-10-CM | POA: Diagnosis not present

## 2018-08-16 DIAGNOSIS — I10 Essential (primary) hypertension: Secondary | ICD-10-CM | POA: Diagnosis not present

## 2018-08-16 DIAGNOSIS — M6281 Muscle weakness (generalized): Secondary | ICD-10-CM | POA: Diagnosis not present

## 2018-08-16 DIAGNOSIS — G894 Chronic pain syndrome: Secondary | ICD-10-CM | POA: Diagnosis not present

## 2018-08-16 DIAGNOSIS — Z720 Tobacco use: Secondary | ICD-10-CM | POA: Diagnosis not present

## 2018-08-20 DIAGNOSIS — I959 Hypotension, unspecified: Secondary | ICD-10-CM | POA: Diagnosis not present

## 2018-08-20 DIAGNOSIS — R41844 Frontal lobe and executive function deficit: Secondary | ICD-10-CM | POA: Diagnosis not present

## 2018-08-20 DIAGNOSIS — G43909 Migraine, unspecified, not intractable, without status migrainosus: Secondary | ICD-10-CM | POA: Diagnosis not present

## 2018-08-20 DIAGNOSIS — I639 Cerebral infarction, unspecified: Secondary | ICD-10-CM | POA: Diagnosis not present

## 2018-08-20 DIAGNOSIS — N139 Obstructive and reflux uropathy, unspecified: Secondary | ICD-10-CM | POA: Diagnosis not present

## 2018-08-20 DIAGNOSIS — Z79899 Other long term (current) drug therapy: Secondary | ICD-10-CM | POA: Diagnosis not present

## 2018-08-20 DIAGNOSIS — E876 Hypokalemia: Secondary | ICD-10-CM | POA: Diagnosis not present

## 2018-08-20 DIAGNOSIS — R1311 Dysphagia, oral phase: Secondary | ICD-10-CM | POA: Diagnosis not present

## 2018-08-20 DIAGNOSIS — L720 Epidermal cyst: Secondary | ICD-10-CM | POA: Diagnosis not present

## 2018-08-20 DIAGNOSIS — J069 Acute upper respiratory infection, unspecified: Secondary | ICD-10-CM | POA: Diagnosis not present

## 2018-08-20 DIAGNOSIS — E559 Vitamin D deficiency, unspecified: Secondary | ICD-10-CM | POA: Diagnosis not present

## 2018-08-20 DIAGNOSIS — R4182 Altered mental status, unspecified: Secondary | ICD-10-CM | POA: Diagnosis not present

## 2018-08-20 DIAGNOSIS — F17200 Nicotine dependence, unspecified, uncomplicated: Secondary | ICD-10-CM | POA: Diagnosis not present

## 2018-08-20 DIAGNOSIS — K529 Noninfective gastroenteritis and colitis, unspecified: Secondary | ICD-10-CM | POA: Diagnosis not present

## 2018-08-20 DIAGNOSIS — R41840 Attention and concentration deficit: Secondary | ICD-10-CM | POA: Diagnosis not present

## 2018-08-20 DIAGNOSIS — G894 Chronic pain syndrome: Secondary | ICD-10-CM | POA: Diagnosis not present

## 2018-08-20 DIAGNOSIS — L821 Other seborrheic keratosis: Secondary | ICD-10-CM | POA: Diagnosis not present

## 2018-08-20 DIAGNOSIS — D649 Anemia, unspecified: Secondary | ICD-10-CM | POA: Diagnosis not present

## 2018-08-20 DIAGNOSIS — M6281 Muscle weakness (generalized): Secondary | ICD-10-CM | POA: Diagnosis not present

## 2018-08-20 DIAGNOSIS — E039 Hypothyroidism, unspecified: Secondary | ICD-10-CM | POA: Diagnosis not present

## 2018-08-20 DIAGNOSIS — D225 Melanocytic nevi of trunk: Secondary | ICD-10-CM | POA: Diagnosis not present

## 2018-08-20 DIAGNOSIS — A419 Sepsis, unspecified organism: Secondary | ICD-10-CM | POA: Diagnosis not present

## 2018-08-20 DIAGNOSIS — K7031 Alcoholic cirrhosis of liver with ascites: Secondary | ICD-10-CM | POA: Diagnosis not present

## 2018-08-20 DIAGNOSIS — I69391 Dysphagia following cerebral infarction: Secondary | ICD-10-CM | POA: Diagnosis not present

## 2018-08-20 DIAGNOSIS — F411 Generalized anxiety disorder: Secondary | ICD-10-CM | POA: Diagnosis not present

## 2018-08-20 DIAGNOSIS — R21 Rash and other nonspecific skin eruption: Secondary | ICD-10-CM | POA: Diagnosis not present

## 2018-08-20 DIAGNOSIS — Z720 Tobacco use: Secondary | ICD-10-CM | POA: Diagnosis not present

## 2018-08-22 LAB — TSH: TSH: 23.5 — AB (ref 0.41–5.90)

## 2018-08-23 DIAGNOSIS — E039 Hypothyroidism, unspecified: Secondary | ICD-10-CM | POA: Diagnosis not present

## 2018-08-23 DIAGNOSIS — R21 Rash and other nonspecific skin eruption: Secondary | ICD-10-CM | POA: Diagnosis not present

## 2018-08-23 DIAGNOSIS — F17200 Nicotine dependence, unspecified, uncomplicated: Secondary | ICD-10-CM | POA: Diagnosis not present

## 2018-08-23 DIAGNOSIS — Z720 Tobacco use: Secondary | ICD-10-CM | POA: Diagnosis not present

## 2018-08-23 DIAGNOSIS — J069 Acute upper respiratory infection, unspecified: Secondary | ICD-10-CM | POA: Diagnosis not present

## 2018-08-29 DIAGNOSIS — G43909 Migraine, unspecified, not intractable, without status migrainosus: Secondary | ICD-10-CM | POA: Diagnosis not present

## 2018-08-29 DIAGNOSIS — J069 Acute upper respiratory infection, unspecified: Secondary | ICD-10-CM | POA: Diagnosis not present

## 2018-08-29 DIAGNOSIS — E039 Hypothyroidism, unspecified: Secondary | ICD-10-CM | POA: Diagnosis not present

## 2018-08-29 DIAGNOSIS — Z720 Tobacco use: Secondary | ICD-10-CM | POA: Diagnosis not present

## 2018-08-31 ENCOUNTER — Other Ambulatory Visit: Payer: Self-pay | Admitting: Hospice and Palliative Medicine

## 2018-08-31 DIAGNOSIS — L821 Other seborrheic keratosis: Secondary | ICD-10-CM | POA: Diagnosis not present

## 2018-08-31 DIAGNOSIS — R51 Headache: Principal | ICD-10-CM

## 2018-08-31 DIAGNOSIS — L720 Epidermal cyst: Secondary | ICD-10-CM | POA: Diagnosis not present

## 2018-08-31 DIAGNOSIS — R519 Headache, unspecified: Secondary | ICD-10-CM

## 2018-08-31 DIAGNOSIS — D225 Melanocytic nevi of trunk: Secondary | ICD-10-CM | POA: Diagnosis not present

## 2018-09-16 DIAGNOSIS — R05 Cough: Secondary | ICD-10-CM | POA: Diagnosis not present

## 2018-09-16 DIAGNOSIS — R509 Fever, unspecified: Secondary | ICD-10-CM | POA: Diagnosis not present

## 2018-09-16 DIAGNOSIS — R0989 Other specified symptoms and signs involving the circulatory and respiratory systems: Secondary | ICD-10-CM | POA: Diagnosis not present

## 2018-09-16 DIAGNOSIS — I447 Left bundle-branch block, unspecified: Secondary | ICD-10-CM | POA: Diagnosis not present

## 2018-09-17 DIAGNOSIS — F17200 Nicotine dependence, unspecified, uncomplicated: Secondary | ICD-10-CM | POA: Diagnosis not present

## 2018-09-17 DIAGNOSIS — R509 Fever, unspecified: Secondary | ICD-10-CM | POA: Diagnosis not present

## 2018-09-17 DIAGNOSIS — Z7982 Long term (current) use of aspirin: Secondary | ICD-10-CM | POA: Diagnosis not present

## 2018-09-17 DIAGNOSIS — G9389 Other specified disorders of brain: Secondary | ICD-10-CM | POA: Diagnosis not present

## 2018-09-17 DIAGNOSIS — N3001 Acute cystitis with hematuria: Secondary | ICD-10-CM | POA: Diagnosis not present

## 2018-09-17 DIAGNOSIS — R52 Pain, unspecified: Secondary | ICD-10-CM | POA: Diagnosis not present

## 2018-09-17 DIAGNOSIS — G43909 Migraine, unspecified, not intractable, without status migrainosus: Secondary | ICD-10-CM | POA: Diagnosis not present

## 2018-09-17 DIAGNOSIS — I10 Essential (primary) hypertension: Secondary | ICD-10-CM | POA: Diagnosis not present

## 2018-09-17 DIAGNOSIS — Z79899 Other long term (current) drug therapy: Secondary | ICD-10-CM | POA: Diagnosis not present

## 2018-09-17 DIAGNOSIS — R5081 Fever presenting with conditions classified elsewhere: Secondary | ICD-10-CM | POA: Diagnosis not present

## 2018-09-17 DIAGNOSIS — N390 Urinary tract infection, site not specified: Secondary | ICD-10-CM | POA: Diagnosis not present

## 2018-09-17 DIAGNOSIS — G4489 Other headache syndrome: Secondary | ICD-10-CM | POA: Diagnosis not present

## 2018-09-17 DIAGNOSIS — Z79890 Hormone replacement therapy (postmenopausal): Secondary | ICD-10-CM | POA: Diagnosis not present

## 2018-09-17 DIAGNOSIS — R21 Rash and other nonspecific skin eruption: Secondary | ICD-10-CM | POA: Diagnosis not present

## 2018-09-17 DIAGNOSIS — M542 Cervicalgia: Secondary | ICD-10-CM | POA: Diagnosis not present

## 2018-09-17 LAB — BASIC METABOLIC PANEL
BUN: 23 — AB (ref 4–21)
Creatinine: 0.9 (ref 0.5–1.1)

## 2018-09-18 DIAGNOSIS — I959 Hypotension, unspecified: Secondary | ICD-10-CM | POA: Diagnosis not present

## 2018-09-18 DIAGNOSIS — Z743 Need for continuous supervision: Secondary | ICD-10-CM | POA: Diagnosis not present

## 2018-09-21 ENCOUNTER — Ambulatory Visit: Payer: Medicare Other | Admitting: "Endocrinology

## 2018-09-21 ENCOUNTER — Encounter: Payer: Self-pay | Admitting: "Endocrinology

## 2018-09-21 ENCOUNTER — Ambulatory Visit (INDEPENDENT_AMBULATORY_CARE_PROVIDER_SITE_OTHER): Payer: Medicare Other | Admitting: "Endocrinology

## 2018-09-21 VITALS — BP 164/86 | HR 66 | Ht 62.0 in | Wt 173.0 lb

## 2018-09-21 DIAGNOSIS — K7031 Alcoholic cirrhosis of liver with ascites: Secondary | ICD-10-CM | POA: Diagnosis not present

## 2018-09-21 DIAGNOSIS — F17200 Nicotine dependence, unspecified, uncomplicated: Secondary | ICD-10-CM | POA: Diagnosis not present

## 2018-09-21 DIAGNOSIS — R7303 Prediabetes: Secondary | ICD-10-CM | POA: Diagnosis not present

## 2018-09-21 DIAGNOSIS — E039 Hypothyroidism, unspecified: Secondary | ICD-10-CM | POA: Diagnosis not present

## 2018-09-21 DIAGNOSIS — I639 Cerebral infarction, unspecified: Secondary | ICD-10-CM | POA: Diagnosis not present

## 2018-09-21 DIAGNOSIS — D649 Anemia, unspecified: Secondary | ICD-10-CM | POA: Diagnosis not present

## 2018-09-21 DIAGNOSIS — R41844 Frontal lobe and executive function deficit: Secondary | ICD-10-CM | POA: Diagnosis not present

## 2018-09-21 MED ORDER — LEVOTHYROXINE SODIUM 125 MCG PO TABS: 125.0000 ug | ORAL_TABLET | Freq: Every day | ORAL | 3 refills | Status: DC

## 2018-09-21 NOTE — Progress Notes (Signed)
Endocrinology Consult Note                                         09/21/2018, 1:26 PM   Deborah Russo is a 57 y.o.-year-old female patient being seen in consultation for hypothyroidism referred by Wynell Balloon, NP.    Past Medical History:  Diagnosis Date  . Chronic abdominal pain    since approximately 1991  . Chronic migraine   . Chronic pain   . Hypothyroidism   . Nausea, vomiting, and diarrhea    recurrent, chronic  . Pain management   . Sleep apnea    has CPAP, but doesn't wear it bc she says "i can't sleep with it on"  . Stroke [facility])    Past Surgical History:  Procedure Laterality Date  . ABDOMINAL HYSTERECTOMY    . ABDOMINAL SURGERY    . BIOPSY N/A 07/17/2014   Procedure: BIOPSY;  Surgeon: Rogene Houston, MD;  Location: [facility];  Service: Endoscopy;  Laterality: N/A;  . BIOPSY  06/16/2018   Procedure: BIOPSY;  Surgeon: [name], MD;  Location: [place];  Service: Endoscopy;;  . BREAST SURGERY Left    removed nipple  . CESAREAN SECTION    . CHOLECYSTECTOMY    . ESOPHAGOGASTRODUODENOSCOPY N/A 07/17/2014   Procedure: ESOPHAGOGASTRODUODENOSCOPY (EGD);  Surgeon: Rogene Houston, MD;  Location: [facility];  Service: Endoscopy;  Laterality: N/A;  . FLEXIBLE SIGMOIDOSCOPY N/A 06/16/2018   Procedure: FLEXIBLE SIGMOIDOSCOPY;  Surgeon: [name], MD;  Location: [facility];  Service: Endoscopy;  Laterality: N/A;   Social History   Socioeconomic History  . Marital status: Divorced    Spouse name: Not on file  . Number of children: Not on file  . Years of education: Not on file  . Highest education level: Not on file  Occupational History  . Not on file  Social Needs  . Financial resource strain: Not on file  . Food insecurity:    Worry: Not on file    Inability: Not on file  . Transportation needs:    Medical: Not on file    Non-medical: Not on file  Tobacco Use  .  Smoking status: Current Every Day Smoker    Packs/day: 1.00    Years: 41.00    Pack years: 41.00    Types: Cigarettes  . Smokeless tobacco: Never Used  Substance and Sexual Activity  . Alcohol use: No  . Drug use: Yes    Frequency: 2.0 times per week    Types: Marijuana, Oxycodone  . Sexual activity: Not Currently  Lifestyle  . Physical activity:    Days per week: Not on file    Minutes per session: Not on file  . Stress: Not on file  Relationships  . Social connections:    Talks on phone: Not on file    Gets together: Not on file    Attends religious service: Not on file  Active member of club or organization: Not on file    Attends meetings of clubs or organizations: Not on file    Relationship status: Not on file  Other Topics Concern  . Not on file  Social History Narrative  . Not on file   Outpatient Encounter Medications as of 09/21/2018  Medication Sig  . acetaminophen (TYLENOL) 500 MG tablet Take 1,000 mg by mouth every 12 (twelve) hours as needed.  Marland Kitchen aspirin 325 MG tablet Take 1 tablet (325 mg total) by mouth daily.  . Cholecalciferol (VITAMIN D) 2000 units CAPS Take by mouth daily.  . cholecalciferol 1000 units tablet Take 1 tablet (1,000 Units total) by mouth daily.  . cholestyramine (QUESTRAN) 4 g packet Take 1 packet (4 g total) by mouth 2 (two) times daily.  . ciprofloxacin (CIPRO) 250 MG tablet Take 250 mg by mouth 2 (two) times daily.  . cloNIDine (CATAPRES) 0.2 MG tablet Take 0.2 mg by mouth 2 (two) times daily.  . diazepam (VALIUM) 5 MG tablet Take 5 mg by mouth every 8 (eight) hours as needed for anxiety.  . diphenoxylate-atropine (LOMOTIL) 2.5-0.025 MG tablet Take 1 tablet by mouth 3 (three) times daily.  . famotidine (PEPCID) 20 MG tablet Take 1 tablet (20 mg total) by mouth 2 (two) times daily.  . fludrocortisone (FLORINEF) 0.1 MG tablet Take 1 tablet (0.1 mg total) by mouth daily.  . folic acid (FOLVITE) 1 MG tablet Place 1 tablet (1 mg total) into  feeding tube daily.  Marland Kitchen levothyroxine (SYNTHROID, LEVOTHROID) 125 MCG tablet Take 1 tablet (125 mcg total) by mouth daily before breakfast.  . loperamide (IMODIUM) 2 MG capsule Take 2 capsules (4 mg total) by mouth 2 (two) times daily.  . Multiple Vitamin (MULTIVITAMIN WITH MINERALS) TABS tablet Take 1 tablet by mouth daily.  . Multiple Vitamins-Minerals (CERTAGEN SILVER PO) Take by mouth.  . oxyCODONE-acetaminophen (PERCOCET/ROXICET) 5-325 MG tablet Take 1 tablet by mouth every 8 (eight) hours as needed for severe pain.  Marland Kitchen oxyCODONE-acetaminophen (PERCOCET/ROXICET) 5-325 MG tablet Take by mouth every 8 (eight) hours as needed for severe pain.  Marland Kitchen Potassium Chloride Crys ER (KLOR-CON M20 PO) Take by mouth.  . pravastatin (PRAVACHOL) 20 MG tablet TAKE 1 TABLET DAILY  . SUMAtriptan (IMITREX) 50 MG tablet Take 50 mg by mouth every 6 (six) hours as needed for migraine. May repeat in 2 hours if headache persists or recurs.  . tamsulosin (FLOMAX) 0.4 MG CAPS capsule Take 1 capsule (0.4 mg total) by mouth daily.  Marland Kitchen thiamine 100 MG tablet Take 1 tablet (100 mg total) by mouth daily.  Marland Kitchen thiamine 100 MG tablet Take 100 mg by mouth daily.  . vitamin B-12 (CYANOCOBALAMIN) 250 MCG tablet Take 1 tablet (250 mcg total) by mouth daily.  . [DISCONTINUED] levothyroxine (SYNTHROID, LEVOTHROID) 100 MCG tablet Take 1 tablet (100 mcg total) by mouth daily before breakfast.  . [DISCONTINUED] levothyroxine (SYNTHROID, LEVOTHROID) 175 MCG tablet Take 175 mcg by mouth daily before breakfast.   No facility-administered encounter medications on file as of 09/21/2018.    ALLERGIES: Allergies  Allergen Reactions  . Penicillins Hives    *Tolerates zosyn Has patient had a PCN reaction causing immediate rash, facial/tongue/throat swelling, SOB or lightheadedness with hypotension: No Has patient had a PCN reaction causing severe rash involving mucus membranes or skin necrosis: No Has patient had a PCN reaction that required  hospitalization No Has patient had a PCN reaction occurring within the last 10 years: No If all  of the above answers are "NO", then may proceed with Cephalosporin use.   . Sulfa Antibiotics Hives   VACCINATION STATUS: There is no immunization history for the selected administration types on file for this patient.   HPI    Deborah Russo  is a patient with the above medical history.  He is currently in a nursing home recovering from her recent recurrent strokes.  According to the patient , who the provider of her history, she was diagnosed  with hypothyroidism at approximate age of 61 years with lab work after suggestive clinical symptoms including weight gain, which required subsequnt initiation of thyroid hormone replacement. she was given various doses of levothyroxine over the years, currently on 100 micrograms.  She has taken doses as high as 175 mcg at some point. she reports compliance to this medication:  Taking it daily on empty stomach  with water, separated by >30 minutes before breakfast and other medications , and by at least 4 hours from   calcium, iron, PPIs, multivitamins .  -Her most recent thyroid function tests are summarized below, consistent with inadequate replacement. -She underwent thyroid ultrasound on July 07, 2018-unremarkable with atrophic bilateral thyroid lobes with no nodular lesions.  I reviewed patient's  thyroid tests:  Lab Results  Component Value Date   TSH 23.50 (A) 09/17/2018   TSH 75.673 (H) 06/17/2018   TSH 80.278 (H) 06/13/2018   TSH 8.128 (H) 06/16/2017   TSH 47.236 (H) 03/30/2017   TSH >90.000 (H) 02/19/2015   TSH 0.608 06/20/2014   TSH >100.000 (H) 04/08/2014   FREET4 0.59 (L) 06/22/2018   FREET4 0.72 06/16/2017     Pt describes: Fluctuating body weight, fatigue, cold intolerance, depression, constipation, dry skin.  -She denies feeling nodules in neck, hoarseness, dysphagia/odynophagia, SOB with lying down.  She denies any family  history of thyroid dysfunction, no family history of thyroid malignancy.  -She denies any prior history of thyroidectomy nor treatment with radioactive iodine.  I reviewed her chart and she also has hypertension, lipidemia, aortic atherosclerosis, diabetes with A1c of 6.1%,  recurrent CVA, cirrhosis of liver, chronic heavy smoking.     ROS:  Constitutional: + fluctuating body weight, + fatigue, +subjective hypothermia Eyes: no blurry vision, no xerophthalmia ENT: no sore throat, no nodules palpated in throat, no dysphagia/odynophagia, no hoarseness Cardiovascular: no Chest Pain, +Shortness of Breath, no palpitations, no leg swelling Respiratory: + cough, no SOB Gastrointestinal: no Nausea/Vomiting/Diarhhea Musculoskeletal: no muscle/joint aches Skin: no rashes Neurological: no tremors, no numbness, no tingling, no dizziness Psychiatric: + depression, no anxiety   Physical Exam: BP (!) 164/86   Pulse 66   Ht 5' 2"  (1.575 m)   Wt 173 lb (78.5 kg)   BMI 31.64 kg/m  Wt Readings from Last 3 Encounters:  09/21/18 173 lb (78.5 kg)  06/22/18 200 lb 2.8 oz (90.8 kg)  05/24/17 165 lb (74.8 kg)    Constitutional: + Obese for height, not in acute distress, +uses her walker to get around, normal state of mind Eyes: PERRLA, EOMI, no exophthalmos ENT: moist mucous membranes, no thyromegaly, no cervical lymphadenopathy Cardiovascular: normal precordial activity, Regular Rate and Rhythm, no Murmur/Rubs/Gallops Respiratory:  adequate breathing efforts, no gross chest deformity, + wheezes and rales on bilateral lung fields.    Gastrointestinal: abdomen soft, Non -tender, No distension, Bowel Sounds present Musculoskeletal: no gross deformities, strength intact in all four extremities Skin: moist, warm, no rashes Neurological: no tremor with outstretched hands, Deep tendon reflexes normal in  all four extremities.   CMP ( most recent) CMP     Component Value Date/Time   NA 138 06/23/2018  0752   K 3.8 06/23/2018 0752   CL 108 06/23/2018 0752   CO2 24 06/23/2018 0752   GLUCOSE 121 (H) 06/23/2018 0752   BUN 23 (A) 09/17/2018   CREATININE 0.9 09/17/2018   CREATININE 0.91 06/23/2018 0752   CALCIUM 7.8 (L) 06/23/2018 0752   CALCIUM 7.2 (L) 06/14/2018 0417   PROT 5.3 (L) 06/23/2018 0752   ALBUMIN 2.2 (L) 06/23/2018 0752   AST 21 06/23/2018 0752   ALT 13 06/23/2018 0752   ALKPHOS 117 06/23/2018 0752   BILITOT 0.7 06/23/2018 0752   GFRNONAA >60 06/23/2018 0752   GFRAA >60 06/23/2018 0752     Diabetic Labs (most recent): Lab Results  Component Value Date   HGBA1C 6.1 (H) 06/19/2018   HGBA1C 5.0 03/29/2017   HGBA1C 5.9 (H) 06/19/2014     Lipid Panel ( most recent) Lipid Panel     Component Value Date/Time   CHOL 201 (H) 03/30/2017 0431   TRIG 115 06/11/2018 2114   HDL 47 03/30/2017 0431   CHOLHDL 4.3 03/30/2017 0431   VLDL 39 03/30/2017 0431   LDLCALC 115 (H) 03/30/2017 0431       Lab Results  Component Value Date   TSH 23.50 (A) 09/17/2018   TSH 75.673 (H) 06/17/2018   TSH 80.278 (H) 06/13/2018   TSH 8.128 (H) 06/16/2017   TSH 47.236 (H) 03/30/2017   TSH >90.000 (H) 02/19/2015   TSH 0.608 06/20/2014   TSH >100.000 (H) 04/08/2014   FREET4 0.59 (L) 06/22/2018   FREET4 0.72 06/16/2017     Thyroid ultrasound from July 07, 2018: Right lobe 2.7 x 1.0 x 0.9 cm, left lobe 2.3 x 0.9 x 1.1 cm.  No evidence of cysts or nodules bilaterally.  ASSESSMENT: 1. Hypothyroidism 2.  Chronic heavy smoking 3.  Prediabetes  PLAN:    Patient with long-standing primary hypothyroidism, on levothyroxine therapy. - On physical exam , patient  does not  have a goiter, thyroid nodules, or neck compression symptoms-her recent ultrasound is remarkable for bilaterally atrophic thyroid lobes with no nodules.  -Biochemically and clinically, displays under replacement with thyroid hormone. -I discussed and increased her levothyroxine to 125 mcg p.o. every morning. - We  discussed about correct intake of levothyroxine, at fasting, with water, separated by at least 30 minutes from breakfast, and separated by more than 4 hours from calcium, iron, multivitamins, acid reflux medications (PPIs). -Patient is made aware of the fact that thyroid hormone replacement is needed for life, dose to be adjusted by periodic monitoring of thyroid function tests. - Will check thyroid tests before next visit: TSH, free T4 -Thyroid ultrasound is unremarkable, no intervention needed. -Extensively counseled against smoking and prediabetes.  - Time spent with the patient: 45 minutes, of which >50% was spent in obtaining information about her symptoms, reviewing her previous labs, evaluations, and treatments, counseling her about her hypothyroidism, chronic heavy smoking, prediabetes, and developing a plan to confirm the diagnosis and long term treatment as necessary.  [name] participated in the discussions, expressed understanding, and voiced agreement with the above plans.  All questions were answered to her satisfaction. she is encouraged to contact clinic should she have any questions or concerns prior to her return visit.  Return in about 3 months (around 12/22/2018) for Follow up with Pre-visit Labs.  Glade Lloyd, MD [place]  [facility] [address] [place], [place] [address] Phone: [phone]  Fax: [phone]   09/21/2018, 1:26 PM  This note was partially dictated with voice recognition software. Similar sounding words can be transcribed inadequately or may not  be corrected upon review.

## 2018-09-26 ENCOUNTER — Telehealth: Payer: Self-pay | Admitting: "Endocrinology

## 2018-09-26 NOTE — Telephone Encounter (Signed)
[place] and notified them of dosage

## 2018-09-26 NOTE — Telephone Encounter (Signed)
Deborah Russo at the assisted living home is calling asking for dosage instructions on Gwendolyns evothyroxine (SYNTHROID, LEVOTHROID) 125 MCG tablet please advise

## 2018-09-29 ENCOUNTER — Ambulatory Visit (HOSPITAL_COMMUNITY): Payer: Medicare Other | Attending: Hospice and Palliative Medicine

## 2018-10-04 DIAGNOSIS — E039 Hypothyroidism, unspecified: Secondary | ICD-10-CM | POA: Diagnosis not present

## 2018-10-18 DIAGNOSIS — I639 Cerebral infarction, unspecified: Secondary | ICD-10-CM | POA: Diagnosis not present

## 2018-10-18 DIAGNOSIS — G43909 Migraine, unspecified, not intractable, without status migrainosus: Secondary | ICD-10-CM | POA: Diagnosis not present

## 2018-10-18 DIAGNOSIS — E559 Vitamin D deficiency, unspecified: Secondary | ICD-10-CM | POA: Diagnosis not present

## 2018-10-18 DIAGNOSIS — Z720 Tobacco use: Secondary | ICD-10-CM | POA: Diagnosis not present

## 2018-10-24 DIAGNOSIS — F411 Generalized anxiety disorder: Secondary | ICD-10-CM | POA: Diagnosis not present

## 2018-10-24 DIAGNOSIS — K7030 Alcoholic cirrhosis of liver without ascites: Secondary | ICD-10-CM | POA: Diagnosis not present

## 2018-10-24 DIAGNOSIS — I6931 Attention and concentration deficit following cerebral infarction: Secondary | ICD-10-CM | POA: Diagnosis not present

## 2018-10-24 DIAGNOSIS — I10 Essential (primary) hypertension: Secondary | ICD-10-CM | POA: Diagnosis not present

## 2018-10-24 DIAGNOSIS — I69391 Dysphagia following cerebral infarction: Secondary | ICD-10-CM | POA: Diagnosis not present

## 2018-10-24 DIAGNOSIS — K529 Noninfective gastroenteritis and colitis, unspecified: Secondary | ICD-10-CM | POA: Diagnosis not present

## 2018-10-26 DIAGNOSIS — F411 Generalized anxiety disorder: Secondary | ICD-10-CM | POA: Diagnosis not present

## 2018-10-26 DIAGNOSIS — K529 Noninfective gastroenteritis and colitis, unspecified: Secondary | ICD-10-CM | POA: Diagnosis not present

## 2018-10-26 DIAGNOSIS — I6931 Attention and concentration deficit following cerebral infarction: Secondary | ICD-10-CM | POA: Diagnosis not present

## 2018-10-26 DIAGNOSIS — I10 Essential (primary) hypertension: Secondary | ICD-10-CM | POA: Diagnosis not present

## 2018-10-26 DIAGNOSIS — K7030 Alcoholic cirrhosis of liver without ascites: Secondary | ICD-10-CM | POA: Diagnosis not present

## 2018-10-26 DIAGNOSIS — I69391 Dysphagia following cerebral infarction: Secondary | ICD-10-CM | POA: Diagnosis not present

## 2018-11-02 DIAGNOSIS — F411 Generalized anxiety disorder: Secondary | ICD-10-CM | POA: Diagnosis not present

## 2018-11-02 DIAGNOSIS — I6931 Attention and concentration deficit following cerebral infarction: Secondary | ICD-10-CM | POA: Diagnosis not present

## 2018-11-02 DIAGNOSIS — K529 Noninfective gastroenteritis and colitis, unspecified: Secondary | ICD-10-CM | POA: Diagnosis not present

## 2018-11-02 DIAGNOSIS — I69391 Dysphagia following cerebral infarction: Secondary | ICD-10-CM | POA: Diagnosis not present

## 2018-11-02 DIAGNOSIS — I10 Essential (primary) hypertension: Secondary | ICD-10-CM | POA: Diagnosis not present

## 2018-11-02 DIAGNOSIS — K7030 Alcoholic cirrhosis of liver without ascites: Secondary | ICD-10-CM | POA: Diagnosis not present

## 2018-11-09 DIAGNOSIS — F411 Generalized anxiety disorder: Secondary | ICD-10-CM | POA: Diagnosis not present

## 2018-11-09 DIAGNOSIS — I69391 Dysphagia following cerebral infarction: Secondary | ICD-10-CM | POA: Diagnosis not present

## 2018-11-09 DIAGNOSIS — K529 Noninfective gastroenteritis and colitis, unspecified: Secondary | ICD-10-CM | POA: Diagnosis not present

## 2018-11-09 DIAGNOSIS — I6931 Attention and concentration deficit following cerebral infarction: Secondary | ICD-10-CM | POA: Diagnosis not present

## 2018-11-09 DIAGNOSIS — I10 Essential (primary) hypertension: Secondary | ICD-10-CM | POA: Diagnosis not present

## 2018-11-09 DIAGNOSIS — K7030 Alcoholic cirrhosis of liver without ascites: Secondary | ICD-10-CM | POA: Diagnosis not present

## 2018-12-22 ENCOUNTER — Ambulatory Visit: Payer: Medicare Other | Admitting: "Endocrinology

## 2019-02-06 ENCOUNTER — Ambulatory Visit: Payer: Medicare Other | Admitting: Family Medicine

## 2019-02-06 ENCOUNTER — Encounter: Payer: Self-pay | Admitting: Family Medicine

## 2019-02-06 ENCOUNTER — Other Ambulatory Visit: Payer: Self-pay

## 2019-02-06 VITALS — BP 162/88 | HR 96 | Temp 98.2°F | Ht 62.0 in | Wt 191.5 lb

## 2019-02-06 DIAGNOSIS — I10 Essential (primary) hypertension: Secondary | ICD-10-CM

## 2019-02-06 DIAGNOSIS — K219 Gastro-esophageal reflux disease without esophagitis: Secondary | ICD-10-CM

## 2019-02-06 DIAGNOSIS — E876 Hypokalemia: Secondary | ICD-10-CM

## 2019-02-06 DIAGNOSIS — E782 Mixed hyperlipidemia: Secondary | ICD-10-CM

## 2019-02-06 DIAGNOSIS — I69351 Hemiplegia and hemiparesis following cerebral infarction affecting right dominant side: Secondary | ICD-10-CM | POA: Diagnosis not present

## 2019-02-06 DIAGNOSIS — E034 Atrophy of thyroid (acquired): Secondary | ICD-10-CM

## 2019-02-06 DIAGNOSIS — R197 Diarrhea, unspecified: Secondary | ICD-10-CM

## 2019-02-06 DIAGNOSIS — E559 Vitamin D deficiency, unspecified: Secondary | ICD-10-CM

## 2019-02-06 DIAGNOSIS — E2740 Unspecified adrenocortical insufficiency: Secondary | ICD-10-CM

## 2019-02-06 DIAGNOSIS — D696 Thrombocytopenia, unspecified: Secondary | ICD-10-CM

## 2019-02-06 DIAGNOSIS — D539 Nutritional anemia, unspecified: Secondary | ICD-10-CM

## 2019-02-06 DIAGNOSIS — R627 Adult failure to thrive: Secondary | ICD-10-CM

## 2019-02-06 DIAGNOSIS — G43709 Chronic migraine without aura, not intractable, without status migrainosus: Secondary | ICD-10-CM

## 2019-02-06 DIAGNOSIS — E538 Deficiency of other specified B group vitamins: Secondary | ICD-10-CM | POA: Insufficient documentation

## 2019-02-06 DIAGNOSIS — K7460 Unspecified cirrhosis of liver: Secondary | ICD-10-CM

## 2019-02-06 DIAGNOSIS — IMO0002 Reserved for concepts with insufficient information to code with codable children: Secondary | ICD-10-CM

## 2019-02-06 DIAGNOSIS — I700 Atherosclerosis of aorta: Secondary | ICD-10-CM

## 2019-02-06 MED ORDER — PRAVASTATIN SODIUM 20 MG PO TABS: 20.0000 mg | ORAL_TABLET | Freq: Every day | ORAL | 3 refills | Status: DC

## 2019-02-06 MED ORDER — LEVOTHYROXINE SODIUM 125 MCG PO TABS: 125.0000 ug | ORAL_TABLET | Freq: Every day | ORAL | 3 refills | Status: DC

## 2019-02-06 MED ORDER — FAMOTIDINE 20 MG PO TABS: 20.0000 mg | ORAL_TABLET | Freq: Two times a day (BID) | ORAL | 0 refills | Status: DC

## 2019-02-06 MED ORDER — FOLIC ACID 1 MG PO TABS: 1.0000 mg | ORAL_TABLET | Freq: Every day | ORAL | Status: DC

## 2019-02-06 MED ORDER — ADULT MULTIVITAMIN W/MINERALS CH: 1.0000 | ORAL_TABLET | Freq: Every day | ORAL | Status: DC

## 2019-02-06 MED ORDER — FLUDROCORTISONE ACETATE 0.1 MG PO TABS: 0.1000 mg | ORAL_TABLET | Freq: Every day | ORAL | Status: DC

## 2019-02-06 MED ORDER — THIAMINE HCL 100 MG PO TABS: 100.0000 mg | ORAL_TABLET | Freq: Every day | ORAL | 3 refills | Status: DC

## 2019-02-06 MED ORDER — CLONIDINE HCL 0.2 MG PO TABS: 0.2000 mg | ORAL_TABLET | Freq: Two times a day (BID) | ORAL | 3 refills | Status: DC

## 2019-02-06 MED ORDER — SUMATRIPTAN SUCCINATE 50 MG PO TABS: 50.0000 mg | ORAL_TABLET | Freq: Four times a day (QID) | ORAL | 0 refills | Status: DC | PRN

## 2019-02-06 MED ORDER — VITAMIN D 50 MCG (2000 UT) PO CAPS: 1.0000 | ORAL_CAPSULE | Freq: Every day | ORAL | 2 refills | Status: AC

## 2019-02-06 MED ORDER — CHOLESTYRAMINE 4 G PO PACK: 4.0000 g | PACK | Freq: Two times a day (BID) | ORAL | 12 refills | Status: DC

## 2019-02-06 MED ORDER — CYANOCOBALAMIN 250 MCG PO TABS: 250.0000 ug | ORAL_TABLET | Freq: Every day | ORAL | Status: DC

## 2019-02-06 MED ORDER — ASPIRIN 325 MG PO TABS: 325.0000 mg | ORAL_TABLET | Freq: Every day | ORAL | 0 refills | Status: AC

## 2019-02-06 MED ORDER — POTASSIUM CHLORIDE CRYS ER 20 MEQ PO TBCR: 20.0000 meq | EXTENDED_RELEASE_TABLET | Freq: Every day | ORAL | 3 refills | Status: DC

## 2019-02-06 NOTE — Progress Notes (Signed)
Subjective:  Patient ID: Deborah Russo, female    DOB: 1961-10-08, 58 y.o.   MRN: [id number]  Chief Complaint:  Establish Care   HPI: Deborah Russo is a 58 y.o. female presenting on 02/06/2019 for [place] presents today to establish care. Pt states she was discharged from [facility] in November. Pt states she has not seen a provider since that time. Pt states she has been out of her regular medications for over a month, maybe longer. Pt states she was sent to [facility] after an admission to [facility] for altered mental status. She was admitted to [facility] on 06/11/2018 and discharged to a SNF on 06/23/2018 with a diagnosis of hypotension, colitis, and cirrhosis. Pt is vague with history and reason for admission to the SNF and reason for delay in follow up care. Pt states she has been doing well overall since discharge. Pt states she has residual right sided weakness from a previous CVA. Pt states she does see a pain management doctor and does see him in the next month.    1. Essential hypertension  Complaint with meds - No Checking BP at home - No Exercising Regularly - No Watching Salt intake - No Pertinent ROS:  Headache - No Chest pain - No Dyspnea - No Palpitations - No LE edema - No They report good compliance with medications and can restate their regimen by memory. No medication side effects.  BP Readings from Last 3 Encounters:  02/06/19 (!) 162/88  09/21/18 (!) 164/86  06/24/18 (!) 91/59     2. Hypothyroidism due to acquired atrophy of thyroid  Has been out of medications for over a month. Does have fatigue.    3. Cirrhosis of liver without ascites, unspecified hepatic cirrhosis type ([place])  Due to medication not alcohol per pt. Denies abdominal distention or confusion. No icterus.    4. Hemiparesis of right dominant side as late effect of cerebral infarction [facility])  Ongoing since previous CVA (2018). Able to ambulate with a can. Can  perform most ADL's at home. Daughter does help with getting in and out of the bath.   5. Aortic atherosclerosis ([place])  Has not seen cardiology. Does take Aspirin on a daily basis.   6. Chronic migraine  Has not had a migraine in several months. Well controlled with triptans PRN.    7. Hypokalemia  Takes potassium supplements. Has been out of for a while. Denies muscle cramps, palpitations, weakness, or chest pain.   8. Macrocytic anemia  Was on folate and B12 supplements. Has been out of medications for a while. Does have fatigue. No abnormal bleeding or bruising. No shortness of breath or palpitations.   9. Diarrhea, unspecified type  Has improved since discharge from SNF.    10. Vitamin D deficiency  Has been out of supplements for a while.    11. Mixed hyperlipidemia  Does not diet or exercise on a regular basis.    12. Gastroesophageal reflux disease without esophagitis  Breakthrough symptoms when not on medications. Denies dysphagia, voice changes, sore throat, or hemoptysis.       Relevant past medical, surgical, family, and social history reviewed and updated as indicated.  Allergies and medications reviewed and updated.   Past Medical History:  Diagnosis Date  . Chronic abdominal pain    since approximately 1991  . Chronic migraine   . Chronic pain   . Hypothyroidism   . Nausea, vomiting, and  diarrhea    recurrent, chronic  . Pain management   . Sleep apnea    has CPAP, but doesn't wear it bc she says "i can't sleep with it on"  . Stroke [facility])     Past Surgical History:  Procedure Laterality Date  . ABDOMINAL HYSTERECTOMY    . ABDOMINAL SURGERY    . BIOPSY N/A 07/17/2014   Procedure: BIOPSY;  Surgeon: Rogene Houston, MD;  Location: [facility];  Service: Endoscopy;  Laterality: N/A;  . BIOPSY  06/16/2018   Procedure: BIOPSY;  Surgeon: [name], MD;  Location: [place];  Service: Endoscopy;;  . BREAST SURGERY Left    removed nipple  . CESAREAN  SECTION    . CHOLECYSTECTOMY    . ESOPHAGOGASTRODUODENOSCOPY N/A 07/17/2014   Procedure: ESOPHAGOGASTRODUODENOSCOPY (EGD);  Surgeon: Rogene Houston, MD;  Location: [facility];  Service: Endoscopy;  Laterality: N/A;  . FLEXIBLE SIGMOIDOSCOPY N/A 06/16/2018   Procedure: FLEXIBLE SIGMOIDOSCOPY;  Surgeon: [name], MD;  Location: [facility] END[facility]COPY;  Service: Endoscopy;  Laterality: N/A;    Social History   Socioeconomic History  . Marital status: Divorced    Spouse name: Not on file  . Number of children: Not on file  . Years of education: Not on file  . Highest education level: Not on file  Occupational History  . Not on file  Social Needs  . Financial resource strain: Not on file  . Food insecurity:    Worry: Not on file    Inability: Not on file  . Transportation needs:    Medical: Not on file    Non-medical: Not on file  Tobacco Use  . Smoking status: Current Every Day Smoker    Packs/day: 1.00    Years: 41.00    Pack years: 41.00    Types: Cigarettes  . Smokeless tobacco: Never Used  Substance and Sexual Activity  . Alcohol use: No  . Drug use: Yes    Frequency: 2.0 times per week    Types: Marijuana, Oxycodone  . Sexual activity: Not Currently  Lifestyle  . Physical activity:    Days per week: Not on file    Minutes per session: Not on file  . Stress: Not on file  Relationships  . Social connections:    Talks on phone: Not on file    Gets together: Not on file    Attends religious service: Not on file    Active member of club or organization: Not on file    Attends meetings of clubs or organizations: Not on file    Relationship status: Not on file  . Intimate partner violence:    Fear of current or ex partner: Not on file    Emotionally abused: Not on file    Physically abused: Not on file    Forced sexual activity: Not on file  Other Topics Concern  . Not on file  Social History Narrative  . Not on file    Outpatient Encounter Medications as of 02/06/2019   Medication Sig  . aspirin 325 MG tablet Take 1 tablet (325 mg total) by mouth daily.  . Cholecalciferol (VITAMIN D) 50 MCG (2000 UT) CAPS Take 1 capsule (2,000 Units total) by mouth daily.  . cholestyramine (QUESTRAN) 4 g packet Take 1 packet (4 g total) by mouth 2 (two) times daily.  . cloNIDine (CATAPRES) 0.2 MG tablet Take 1 tablet (0.2 mg total) by mouth 2 (two) times daily for 30 days.  Marland Kitchen  diazepam (VALIUM) 5 MG tablet Take 5 mg by mouth every 8 (eight) hours as needed for anxiety.  . diphenoxylate-atropine (LOMOTIL) 2.5-0.025 MG tablet Take 1 tablet by mouth 3 (three) times daily.  . famotidine (PEPCID) 20 MG tablet Take 1 tablet (20 mg total) by mouth 2 (two) times daily.  . fludrocortisone (FLORINEF) 0.1 MG tablet Take 1 tablet (0.1 mg total) by mouth daily.  . folic acid (FOLVITE) 1 MG tablet Place 1 tablet (1 mg total) into feeding tube daily.  Marland Kitchen levothyroxine (SYNTHROID, LEVOTHROID) 125 MCG tablet Take 1 tablet (125 mcg total) by mouth daily before breakfast.  . loperamide (IMODIUM) 2 MG capsule Take 2 capsules (4 mg total) by mouth 2 (two) times daily.  . Multiple Vitamin (MULTIVITAMIN WITH MINERALS) TABS tablet Take 1 tablet by mouth daily.  Marland Kitchen oxyCODONE-acetaminophen (PERCOCET/ROXICET) 5-325 MG tablet Take 1 tablet by mouth every 8 (eight) hours as needed for severe pain.  Marland Kitchen oxyCODONE-acetaminophen (PERCOCET/ROXICET) 5-325 MG tablet Take by mouth every 8 (eight) hours as needed for severe pain.  . pravastatin (PRAVACHOL) 20 MG tablet Take 1 tablet (20 mg total) by mouth daily.  . SUMAtriptan (IMITREX) 50 MG tablet Take 1 tablet (50 mg total) by mouth every 6 (six) hours as needed for migraine. May repeat in 2 hours if headache persists or recurs.  . tamsulosin (FLOMAX) 0.4 MG CAPS capsule Take 1 capsule (0.4 mg total) by mouth daily.  Marland Kitchen thiamine 100 MG tablet Take 1 tablet (100 mg total) by mouth daily.  . vitamin B-12 (CYANOCOBALAMIN) 250 MCG tablet Take 1 tablet (250 mcg total) by  mouth daily.  . [DISCONTINUED] aspirin 325 MG tablet Take 1 tablet (325 mg total) by mouth daily.  . [DISCONTINUED] Cholecalciferol (VITAMIN D) 2000 units CAPS Take by mouth daily.  . [DISCONTINUED] cholestyramine (QUESTRAN) 4 g packet Take 1 packet (4 g total) by mouth 2 (two) times daily.  . [DISCONTINUED] cloNIDine (CATAPRES) 0.2 MG tablet Take 0.2 mg by mouth 2 (two) times daily.  . [DISCONTINUED] famotidine (PEPCID) 20 MG tablet Take 1 tablet (20 mg total) by mouth 2 (two) times daily.  . [DISCONTINUED] fludrocortisone (FLORINEF) 0.1 MG tablet Take 1 tablet (0.1 mg total) by mouth daily.  . [DISCONTINUED] folic acid (FOLVITE) 1 MG tablet Place 1 tablet (1 mg total) into feeding tube daily.  . [DISCONTINUED] levothyroxine (SYNTHROID, LEVOTHROID) 125 MCG tablet Take 1 tablet (125 mcg total) by mouth daily before breakfast.  . [DISCONTINUED] Multiple Vitamin (MULTIVITAMIN WITH MINERALS) TABS tablet Take 1 tablet by mouth daily.  . [DISCONTINUED] Potassium Chloride Crys ER (KLOR-CON M20 PO) Take by mouth.  . [DISCONTINUED] pravastatin (PRAVACHOL) 20 MG tablet TAKE 1 TABLET DAILY  . [DISCONTINUED] SUMAtriptan (IMITREX) 50 MG tablet Take 50 mg by mouth every 6 (six) hours as needed for migraine. May repeat in 2 hours if headache persists or recurs.  . [DISCONTINUED] thiamine 100 MG tablet Take 100 mg by mouth daily.  . [DISCONTINUED] vitamin B-12 (CYANOCOBALAMIN) 250 MCG tablet Take 1 tablet (250 mcg total) by mouth daily.  . potassium chloride SA (KLOR-CON M20) 20 MEQ tablet Take 1 tablet (20 mEq total) by mouth daily.  . [DISCONTINUED] acetaminophen (TYLENOL) 500 MG tablet Take 1,000 mg by mouth every 12 (twelve) hours as needed.  . [DISCONTINUED] cholecalciferol 1000 units tablet Take 1 tablet (1,000 Units total) by mouth daily.  . [DISCONTINUED] ciprofloxacin (CIPRO) 250 MG tablet Take 250 mg by mouth 2 (two) times daily.  . [DISCONTINUED] Multiple Vitamins-Minerals (  CERTAGEN SILVER PO) Take by  mouth.  . [DISCONTINUED] thiamine 100 MG tablet Take 1 tablet (100 mg total) by mouth daily.   No facility-administered encounter medications on file as of 02/06/2019.     Allergies  Allergen Reactions  . Penicillins Hives    *Tolerates zosyn Has patient had a PCN reaction causing immediate rash, facial/tongue/throat swelling, SOB or lightheadedness with hypotension: No Has patient had a PCN reaction causing severe rash involving mucus membranes or skin necrosis: No Has patient had a PCN reaction that required hospitalization No Has patient had a PCN reaction occurring within the last 10 years: No If all of the above answers are "NO", then may proceed with Cephalosporin use.   . Sulfa Antibiotics Hives    Review of Systems  Constitutional: Negative for chills, fatigue, fever and unexpected weight change.  Eyes: Negative for photophobia and visual disturbance.  Respiratory: Negative for apnea, cough, choking, chest tightness, shortness of breath, wheezing and stridor.   Cardiovascular: Negative for chest pain, palpitations and leg swelling.  Gastrointestinal: Negative for abdominal distention, abdominal pain, anal bleeding, blood in stool, constipation, diarrhea, nausea, rectal pain and vomiting.  Endocrine: Negative for cold intolerance, heat intolerance, polydipsia, polyphagia and polyuria.  Genitourinary: Negative for difficulty urinating, dysuria, flank pain, frequency and urgency.  Musculoskeletal: Positive for gait problem (from previous CVA).  Neurological: Positive for weakness (right sided from previous CVA). Negative for dizziness, tremors, seizures, syncope, numbness and headaches.  Hematological: Negative for adenopathy. Does not bruise/bleed easily.  Psychiatric/Behavioral: Negative for confusion, self-injury, sleep disturbance and suicidal ideas.  All other systems reviewed and are negative.       Objective:  BP (!) 162/88   Pulse 96   Temp 98.2 F (36.8 C) (Oral)    Ht 5' 2"  (1.575 m)   Wt 191 lb 8 oz (86.9 kg)   BMI 35.03 kg/m    Wt Readings from Last 3 Encounters:  02/06/19 191 lb 8 oz (86.9 kg)  09/21/18 173 lb (78.5 kg)  06/22/18 200 lb 2.8 oz (90.8 kg)    Physical Exam Vitals signs and nursing note reviewed.  Constitutional:      General: She is not in acute distress.    Appearance: She is obese. She is not ill-appearing or toxic-appearing.  HENT:     Head: Normocephalic and atraumatic.     Right Ear: Hearing, tympanic membrane, ear canal and external ear normal.     Left Ear: Hearing, tympanic membrane, ear canal and external ear normal.     Nose: Nose normal.     Mouth/Throat:     Lips: Pink.     Mouth: Mucous membranes are moist.     Pharynx: Oropharynx is clear. Uvula midline.  Eyes:     Extraocular Movements: Extraocular movements intact.     Conjunctiva/sclera: Conjunctivae normal.     Pupils: Pupils are equal, round, and reactive to light.  Neck:     Musculoskeletal: Normal range of motion and neck supple.     Thyroid: No thyroid mass, thyromegaly or thyroid tenderness.     Vascular: No carotid bruit or JVD.     Trachea: Trachea and phonation normal.  Cardiovascular:     Rate and Rhythm: Normal rate and regular rhythm.     Pulses: Normal pulses.     Heart sounds: Normal heart sounds. No murmur. No friction rub. No gallop.   Pulmonary:     Effort: Pulmonary effort is normal. No respiratory distress.  Breath sounds: Normal breath sounds.  Abdominal:     General: Abdomen is protuberant. Bowel sounds are normal. There is no distension.     Palpations: Abdomen is soft. There is no fluid wave, hepatomegaly or splenomegaly.     Tenderness: There is no abdominal tenderness. There is no right CVA tenderness or left CVA tenderness.  Skin:    General: Skin is warm and dry.     Capillary Refill: Capillary refill takes less than 2 seconds.     Coloration: Skin is not cyanotic, jaundiced or pale.  Neurological:     Mental  Status: She is alert and oriented to person, place, and time. Mental status is at baseline.     Motor: Weakness (right sided, previous CVA) present.  Psychiatric:        Mood and Affect: Mood normal.        Behavior: Behavior normal. Behavior is cooperative.        Thought Content: Thought content normal.        Judgment: Judgment normal.     Results for orders placed or performed in visit on 12/75/17  Basic metabolic panel  Result Value Ref Range   BUN 23 (A) 4 - 21   Creatinine 0.9 0.5 - 1.1  TSH  Result Value Ref Range   TSH 23.50 (A) 0.41 - 5.90       Pertinent labs & imaging results that were available during my care of the patient were reviewed by me and considered in my medical decision making.  Assessment & Plan:  Keasia was seen today for establish care.  Diagnoses and all orders for this visit:  Essential hypertension -     aspirin 325 MG tablet; Take 1 tablet (325 mg total) by mouth daily. -     cloNIDine (CATAPRES) 0.2 MG tablet; Take 1 tablet (0.2 mg total) by mouth 2 (two) times daily for 30 days. -     CMP14+EGFR -     CBC with Differential/Platelet -     Microalbumin / creatinine urine ratio -     Referral to Chronic Care Management Services  Hypothyroidism due to acquired atrophy of thyroid -     levothyroxine (SYNTHROID, LEVOTHROID) 125 MCG tablet; Take 1 tablet (125 mcg total) by mouth daily before breakfast. -     TSH -     Referral to Chronic Care Management Services  Cirrhosis of liver without ascites, unspecified hepatic cirrhosis type [facility]) -     Referral to Chronic Care Management Services  Hemiparesis of right dominant side as late effect of cerebral infarction [facility]) -     Referral to Chronic Care Management Services  Aortic atherosclerosis (HCC) -     aspirin 325 MG tablet; Take 1 tablet (325 mg total) by mouth daily. -     pravastatin (PRAVACHOL) 20 MG tablet; Take 1 tablet (20 mg total) by mouth daily. -     Referral to Chronic Care  Management Services  Chronic migraine -     SUMAtriptan (IMITREX) 50 MG tablet; Take 1 tablet (50 mg total) by mouth every 6 (six) hours as needed for migraine. May repeat in 2 hours if headache persists or recurs.  Hypokalemia -     potassium chloride SA (KLOR-CON M20) 20 MEQ tablet; Take 1 tablet (20 mEq total) by mouth daily. -     CMP14+EGFR  Macrocytic anemia -     vitamin B-12 (CYANOCOBALAMIN) 250 MCG tablet; Take 1 tablet (250 mcg total) by  mouth daily. -     CBC with Differential/Platelet -     Vitamin B12  Diarrhea, unspecified type -     cholestyramine (QUESTRAN) 4 g packet; Take 1 packet (4 g total) by mouth 2 (two) times daily.  Vitamin D deficiency -     Cholecalciferol (VITAMIN D) 50 MCG (2000 UT) CAPS; Take 1 capsule (2,000 Units total) by mouth daily. -     VITAMIN D 25 Hydroxy (Vit-D Deficiency, Fractures)  Mixed hyperlipidemia -     cholestyramine (QUESTRAN) 4 g packet; Take 1 packet (4 g total) by mouth 2 (two) times daily. -     pravastatin (PRAVACHOL) 20 MG tablet; Take 1 tablet (20 mg total) by mouth daily. -     Lipid panel -     Referral to Chronic Care Management Services  Gastroesophageal reflux disease without esophagitis -     famotidine (PEPCID) 20 MG tablet; Take 1 tablet (20 mg total) by mouth 2 (two) times daily.  FTT (failure to thrive) in adult -     fludrocortisone (FLORINEF) 0.1 MG tablet; Take 1 tablet (0.1 mg total) by mouth daily. -     folic acid (FOLVITE) 1 MG tablet; Place 1 tablet (1 mg total) into feeding tube daily. -     Multiple Vitamin (MULTIVITAMIN WITH MINERALS) TABS tablet; Take 1 tablet by mouth daily. -     potassium chloride SA (KLOR-CON M20) 20 MEQ tablet; Take 1 tablet (20 mEq total) by mouth daily. -     thiamine 100 MG tablet; Take 1 tablet (100 mg total) by mouth daily. -     vitamin B-12 (CYANOCOBALAMIN) 250 MCG tablet; Take 1 tablet (250 mcg total) by mouth daily.  B12 deficiency -     Vitamin B12  Adrenal  insufficiency (HCC) -     fludrocortisone (FLORINEF) 0.1 MG tablet; Take 1 tablet (0.1 mg total) by mouth daily. -     Referral to Chronic Care Management Services  Thrombocytopenia ([place]) -     CBC with Differential/Platelet -     Referral to Chronic Care Management Services  Morbid obesity ([place]) -     CMP14+EGFR -     TSH -     Lipid panel -     Referral to Chronic Care Management Services  Referral to Chronic Care Management placed. Labs pending. Pt to return in 2 weeks with all medication bottles from home and for a blood pressure recheck.  Pt encouraged to bring a family member with her to her next visit.  Compliance with follow ups and medications encouraged.  Will discuss discontinuation of unnecessary medications at next appointment.   Continue all other maintenance medications.  Follow up plan: Return in about 2 years (around 02/06/2021), or if symptoms worsen or fail to improve, for BP.   The above assessment and management plan was discussed with the patient. The patient verbalized understanding of and has agreed to the management plan. Patient is aware to call the clinic if symptoms persist or worsen. Patient is aware when to return to the clinic for a follow-up visit. Patient educated on when it is appropriate to go to the emergency department.   Total time spent with patient 50 minutes.  Greater than 50% of encounter spent in coordination of care/counseling.  Monia Pouch, FNP-C [facility] [phone]

## 2019-02-07 ENCOUNTER — Other Ambulatory Visit: Payer: Self-pay | Admitting: Family Medicine

## 2019-02-07 DIAGNOSIS — E034 Atrophy of thyroid (acquired): Secondary | ICD-10-CM

## 2019-02-07 LAB — CMP14+EGFR
A/G RATIO: 1.5 (ref 1.2–2.2)
ALBUMIN: 4.7 g/dL (ref 3.8–4.9)
ALT: 11 IU/L (ref 0–32)
AST: 17 IU/L (ref 0–40)
Alkaline Phosphatase: 102 IU/L (ref 39–117)
BUN/Creatinine Ratio: 9 (ref 9–23)
BUN: 13 mg/dL (ref 6–24)
Bilirubin Total: 0.4 mg/dL (ref 0.0–1.2)
CO2: 26 mmol/L (ref 20–29)
Calcium: 10.4 mg/dL — ABNORMAL HIGH (ref 8.7–10.2)
Chloride: 92 mmol/L — ABNORMAL LOW (ref 96–106)
Creatinine, Ser: 1.42 mg/dL — ABNORMAL HIGH (ref 0.57–1.00)
GFR calc Af Amer: 47 mL/min/{1.73_m2} — ABNORMAL LOW (ref >59)
GFR, EST NON AFRICAN AMERICAN: 40 mL/min/{1.73_m2} — AB (ref >59)
Globulin, Total: 3.2 g/dL (ref 1.5–4.5)
Glucose: 137 mg/dL — ABNORMAL HIGH (ref 65–99)
Potassium: 4.1 mmol/L (ref 3.5–5.2)
Sodium: 135 mmol/L (ref 134–144)
Total Protein: 7.9 g/dL (ref 6.0–8.5)

## 2019-02-07 LAB — CBC WITH DIFFERENTIAL/PLATELET
BASOS: 1 %
Basophils Absolute: 0.1 10*3/uL (ref 0.0–0.2)
EOS (ABSOLUTE): 0.1 10*3/uL (ref 0.0–0.4)
Eos: 1 %
Hematocrit: 45 % (ref 34.0–46.6)
Hemoglobin: 15.3 g/dL (ref 11.1–15.9)
Immature Grans (Abs): 0.1 10*3/uL (ref 0.0–0.1)
Immature Granulocytes: 1 %
Lymphocytes Absolute: 3 10*3/uL (ref 0.7–3.1)
Lymphs: 35 %
MCH: 29.9 pg (ref 26.6–33.0)
MCHC: 34 g/dL (ref 31.5–35.7)
MCV: 88 fL (ref 79–97)
Monocytes Absolute: 0.4 10*3/uL (ref 0.1–0.9)
Monocytes: 5 %
Neutrophils Absolute: 5.1 10*3/uL (ref 1.4–7.0)
Neutrophils: 57 %
Platelets: 256 10*3/uL (ref 150–450)
RBC: 5.11 x10E6/uL (ref 3.77–5.28)
RDW: 14.9 % (ref 11.7–15.4)
WBC: 8.7 10*3/uL (ref 3.4–10.8)

## 2019-02-07 LAB — VITAMIN B12: Vitamin B-12: 268 pg/mL (ref 232–1245)

## 2019-02-07 LAB — LIPID PANEL
Chol/HDL Ratio: 6.2 ratio — ABNORMAL HIGH (ref 0.0–4.4)
Cholesterol, Total: 389 mg/dL — ABNORMAL HIGH (ref 100–199)
HDL: 63 mg/dL (ref >39)
LDL Calculated: 269 mg/dL — ABNORMAL HIGH (ref 0–99)
Triglycerides: 283 mg/dL — ABNORMAL HIGH (ref 0–149)
VLDL Cholesterol Cal: 57 mg/dL — ABNORMAL HIGH (ref 5–40)

## 2019-02-07 LAB — MICROALBUMIN / CREATININE URINE RATIO
Creatinine, Urine: 34 mg/dL
Microalb/Creat Ratio: 87 mg/g creat — ABNORMAL HIGH (ref 0–29)
Microalbumin, Urine: 29.5 ug/mL

## 2019-02-07 LAB — TSH: TSH: 296.4 u[IU]/mL — ABNORMAL HIGH (ref 0.450–4.500)

## 2019-02-07 LAB — VITAMIN D 25 HYDROXY (VIT D DEFICIENCY, FRACTURES): Vit D, 25-Hydroxy: 22.2 ng/mL — ABNORMAL LOW (ref 30.0–100.0)

## 2019-02-07 MED ORDER — LEVOTHYROXINE SODIUM 150 MCG PO TABS: 150.0000 ug | ORAL_TABLET | Freq: Every day | ORAL | 3 refills | Status: DC

## 2019-02-08 ENCOUNTER — Ambulatory Visit: Payer: Self-pay | Admitting: Licensed Clinical Social Worker

## 2019-02-08 DIAGNOSIS — I10 Essential (primary) hypertension: Secondary | ICD-10-CM

## 2019-02-08 DIAGNOSIS — R627 Adult failure to thrive: Secondary | ICD-10-CM

## 2019-02-08 DIAGNOSIS — Z79899 Other long term (current) drug therapy: Secondary | ICD-10-CM

## 2019-02-08 DIAGNOSIS — I69351 Hemiplegia and hemiparesis following cerebral infarction affecting right dominant side: Secondary | ICD-10-CM

## 2019-02-08 NOTE — Chronic Care Management (AMB) (Signed)
  Chronic Care Management    Clinical Social Work General Note  02/08/2019 Name: Deborah Russo MRN: [id number] DOB: 02-02-1961  Referred by: PCP, Baruch Gouty, FNP for psychosocial assessment  Deborah Russo was given information about Chronic Care Management services today including:  1. CCM service includes personalized support from designated clinical staff supervised by her physician, including individualized plan of care and coordination with other care providers 2. 24/7 contact phone numbers for assistance for urgent and routine care needs. 3. Service will only be billed when office clinical staff spend 20 minutes or more in a month to coordinate care. 4. Only one practitioner may furnish and bill the service in a calendar month. 5. The patient may stop CCM services at any time (effective at the end of the month) by phone call to the office staff. 6. The patient will be responsible for cost sharing (co-pay) of up to 20% of the service fee (after annual deductible is met).  Patient did not agree to services and wishes to consider information provided before deciding about enrollment in CCM services.   Review of patient status, including review of consultants reports, relevant laboratory and other test results, and collaboration with appropriate care team members and the patient's provider was performed as part of comprehensive patient evaluation and provision of chronic care management services.    Last CCM Appointment: 02/08/2019  Depression screen [facility] 2/9 02/06/2019 09/21/2018 09/29/2016  Decreased Interest 0 0 0  Down, Depressed, Hopeless 0 0 0  PHQ - 2 Score 0 0 0    LCSW spoke via phone with client on 02/08/19 about CCM program services and CCM program services provision. Client is considering CCM program services but has not consented to CCM program services participation at this point.   Follow Up Plan: LCSW to call client in next week to further talk with client about CCM  program services availability.      Norva Riffle.Cauy Melody MSW, LCSW Licensed Clinical Social Worker [facility] Care Management [phone]

## 2019-02-08 NOTE — Patient Instructions (Signed)
Licensed Clinical Social Worker Visit Information  Materials Provided:  No   Ms. Ortez was given information about Chronic Care Management services today including:  1. CCM service includes personalized support from designated clinical staff supervised by her physician, including individualized plan of care and coordination with other care providers 2. 24/7 contact phone numbers for assistance for urgent and routine care needs. 3. Service will only be billed when office clinical staff spend 20 minutes or more in a month to coordinate care. 4. Only one practitioner may furnish and bill the service in a calendar month. 5. The patient may stop CCM services at any time (effective at the end of the month) by phone call to the office staff. 6. The patient will be responsible for cost sharing (co-pay) of up to 20% of the service fee (after annual deductible is met).  Patient did not agree to services and wishes to consider information provided before deciding about enrollment in CCM services.    Follow Up Plan:  LCSW to call client in next week to talk further with client about CCM program services provision  The patient verbalized understanding of instructions provided today and declined a print copy of patient instruction materials.  Norva Riffle.Riker Collier MSW, LCSW Licensed Clinical Social Worker [facility] Care Management [phone]

## 2019-02-09 LAB — T4, FREE: Free T4: <0.11 ng/dL — ABNORMAL LOW (ref 0.82–1.77)

## 2019-02-09 LAB — SPECIMEN STATUS REPORT

## 2019-02-09 LAB — T3, FREE: T3, Free: <0.4 pg/mL — ABNORMAL LOW (ref 2.0–4.4)

## 2019-02-13 ENCOUNTER — Ambulatory Visit: Payer: Self-pay | Admitting: Licensed Clinical Social Worker

## 2019-02-13 NOTE — Patient Instructions (Signed)
Licensed Clinical Social Worker Visit Information  Materials provided:  No  Ms. Crenshaw was given information about Chronic Care Management services today including:  1. CCM service includes personalized support from designated clinical staff supervised by her physician, including individualized plan of care and coordination with other care providers 2. 24/7 contact phone numbers for assistance for urgent and routine care needs. 3. Service will only be billed when office clinical staff spend 20 minutes or more in a month to coordinate care. 4. Only one practitioner may furnish and bill the service in a calendar month. 5. The patient may stop CCM services at any time (effective at the end of the month) by phone call to the office staff. 6. The patient will be responsible for cost sharing (co-pay) of up to 20% of the service fee (after annual deductible is met).  Patient did not agree to services and does not wish to consider at this time.   Follow Up Plan:  Client to attend medical appointments with Baruch Gouty, FNP as scheduled  The patient verbalized understanding of instructions provided today and declined a print copy of patient instruction materials.   Norva Riffle.Santa Abdelrahman MSW, LCSW Licensed Clinical Social Worker [facility] Care [facility] [phone]

## 2019-02-13 NOTE — Chronic Care Management (AMB) (Signed)
Chronic Care Management    Clinical Social Work General Note  02/13/2019 Name: Deborah Russo MRN: [id number] DOB: September 01, 1961  Referred by: PCP, Baruch Gouty, FNP for psychosocial assesment  Deborah Russo was given information about Chronic Care Management services today including:  1. CCM service includes personalized support from designated clinical staff supervised by her physician, including individualized plan of care and coordination with other care providers 2. 24/7 contact phone numbers for assistance for urgent and routine care needs. 3. Service will only be billed when office clinical staff spend 20 minutes or more in a month to coordinate care. 4. Only one practitioner may furnish and bill the service in a calendar month. 5. The patient may stop CCM services at any time (effective at the end of the month) by phone call to the office staff. 6. The patient will be responsible for cost sharing (co-pay) of up to 20% of the service fee (after annual deductible is met).  Patient did not agree to services and does not wish to consider at this time.  Review of patient status, including review of consultants reports, relevant laboratory and other test results, and collaboration with appropriate care team members and the patient's provider was performed as part of comprehensive patient evaluation and provision of chronic care management services.    Last CCM Appointment: 02/13/2019  Depression screen [facility] 2/9 02/06/2019 09/21/2018 09/29/2016  Decreased Interest 0 0 0  Down, Depressed, Hopeless 0 0 0  PHQ - 2 Score 0 0 0    LCSW called client on 02/13/2019 to discuss CCM program services. LCSW informed client of support services with social worker as part of CCM program. LCSW informed client of support services with RN C  Chronic Care Management    Clinical Social Work General Note  02/13/2019 Name: Deborah Russo MRN: [id number] DOB: [birth year]  Referred by: PCP, Baruch Gouty,  FNP for psychosocial assessment  Deborah Russo was given information about Chronic Care Management services today including:  7. CCM service includes personalized support from designated clinical staff supervised by her physician, including individualized plan of care and coordination with other care providers 8. 24/7 contact phone numbers for assistance for urgent and routine care needs. 9. Service will only be billed when office clinical staff spend 20 minutes or more in a month to coordinate care. 10. Only one practitioner may furnish and bill the service in a calendar month. 11. The patient may stop CCM services at any time (effective at the end of the month) by phone call to the office staff. 12. The patient will be responsible for cost sharing (co-pay) of up to 20% of the service fee (after annual deductible is met).  Patient did not agree to services and does not wish to consider at this time.  Review of patient status, including review of consultants reports, relevant laboratory and other test results, and collaboration with appropriate care team members and the patient's provider was performed as part of comprehensive patient evaluation and provision of chronic care management services.    Last CCM Appointment: 02/13/2019  Depression screen [facility] 2/9 02/06/2019 09/21/2018 09/29/2016  Decreased Interest 0 0 0  Down, Depressed, Hopeless 0 0 0  PHQ - 2 Score 0 0 0      LCSW spoke via phone with client on 02/13/2019.  LCSW informed client of CCM program services available through LCSW and through the RN Case Manager.  Client declined all CCM program services at this time.  LCSW  encouraged client to talk with medical provider in the future if client wishes to reconsider involvement in program. At present, client has declined CCM program support  Follow Up Plan: Client to attend medical appointments with Baruch Gouty, FNP as scheduled      Norva Riffle.Demba Nigh MSW, LCSW Licensed Clinical Social  Worker [facility] Care Management [phone]

## 2019-02-20 ENCOUNTER — Ambulatory Visit: Payer: Medicare Other | Admitting: Family Medicine

## 2019-03-05 ENCOUNTER — Ambulatory Visit: Payer: Medicare Other

## 2019-03-05 ENCOUNTER — Ambulatory Visit: Payer: Medicare Other | Admitting: "Endocrinology

## 2019-03-08 ENCOUNTER — Other Ambulatory Visit: Payer: Self-pay | Admitting: Family Medicine

## 2019-03-08 DIAGNOSIS — G43709 Chronic migraine without aura, not intractable, without status migrainosus: Secondary | ICD-10-CM

## 2019-03-08 DIAGNOSIS — IMO0002 Reserved for concepts with insufficient information to code with codable children: Secondary | ICD-10-CM

## 2019-03-09 ENCOUNTER — Ambulatory Visit: Payer: Medicare Other | Admitting: Family Medicine

## 2019-03-21 ENCOUNTER — Other Ambulatory Visit: Payer: Self-pay

## 2019-03-21 DIAGNOSIS — I700 Atherosclerosis of aorta: Secondary | ICD-10-CM

## 2019-03-21 DIAGNOSIS — E782 Mixed hyperlipidemia: Secondary | ICD-10-CM

## 2019-03-21 MED ORDER — PRAVASTATIN SODIUM 20 MG PO TABS: 20.0000 mg | ORAL_TABLET | Freq: Every day | ORAL | 0 refills | Status: DC

## 2019-04-06 ENCOUNTER — Ambulatory Visit (INDEPENDENT_AMBULATORY_CARE_PROVIDER_SITE_OTHER): Payer: Medicare Other

## 2019-04-06 ENCOUNTER — Other Ambulatory Visit: Payer: Self-pay

## 2019-04-06 DIAGNOSIS — K7030 Alcoholic cirrhosis of liver without ascites: Secondary | ICD-10-CM

## 2019-04-06 DIAGNOSIS — Z720 Tobacco use: Secondary | ICD-10-CM

## 2019-04-06 DIAGNOSIS — K529 Noninfective gastroenteritis and colitis, unspecified: Secondary | ICD-10-CM

## 2019-04-06 DIAGNOSIS — R2689 Other abnormalities of gait and mobility: Secondary | ICD-10-CM

## 2019-04-06 DIAGNOSIS — F411 Generalized anxiety disorder: Secondary | ICD-10-CM | POA: Diagnosis not present

## 2019-04-06 DIAGNOSIS — I10 Essential (primary) hypertension: Secondary | ICD-10-CM | POA: Diagnosis not present

## 2019-04-06 DIAGNOSIS — I6931 Attention and concentration deficit following cerebral infarction: Secondary | ICD-10-CM | POA: Diagnosis not present

## 2019-04-06 DIAGNOSIS — I69391 Dysphagia following cerebral infarction: Secondary | ICD-10-CM

## 2019-04-06 DIAGNOSIS — R1311 Dysphagia, oral phase: Secondary | ICD-10-CM

## 2019-04-10 ENCOUNTER — Other Ambulatory Visit: Payer: Self-pay

## 2019-04-10 ENCOUNTER — Ambulatory Visit (INDEPENDENT_AMBULATORY_CARE_PROVIDER_SITE_OTHER): Payer: Medicare Other

## 2019-04-10 DIAGNOSIS — F411 Generalized anxiety disorder: Secondary | ICD-10-CM

## 2019-04-10 DIAGNOSIS — I69391 Dysphagia following cerebral infarction: Secondary | ICD-10-CM

## 2019-04-10 DIAGNOSIS — K7030 Alcoholic cirrhosis of liver without ascites: Secondary | ICD-10-CM

## 2019-04-10 DIAGNOSIS — K529 Noninfective gastroenteritis and colitis, unspecified: Secondary | ICD-10-CM | POA: Diagnosis not present

## 2019-04-10 DIAGNOSIS — I6931 Attention and concentration deficit following cerebral infarction: Secondary | ICD-10-CM

## 2019-04-10 DIAGNOSIS — Z720 Tobacco use: Secondary | ICD-10-CM

## 2019-04-10 DIAGNOSIS — I10 Essential (primary) hypertension: Secondary | ICD-10-CM | POA: Diagnosis not present

## 2019-04-10 DIAGNOSIS — R1311 Dysphagia, oral phase: Secondary | ICD-10-CM

## 2019-04-10 DIAGNOSIS — R2689 Other abnormalities of gait and mobility: Secondary | ICD-10-CM

## 2019-04-16 ENCOUNTER — Ambulatory Visit: Payer: Medicare Other | Admitting: *Deleted

## 2019-04-19 ENCOUNTER — Encounter: Payer: Self-pay | Admitting: Family Medicine

## 2019-04-19 ENCOUNTER — Other Ambulatory Visit: Payer: Self-pay

## 2019-04-19 ENCOUNTER — Ambulatory Visit (INDEPENDENT_AMBULATORY_CARE_PROVIDER_SITE_OTHER): Payer: Medicare Other | Admitting: Family Medicine

## 2019-04-19 DIAGNOSIS — G43709 Chronic migraine without aura, not intractable, without status migrainosus: Secondary | ICD-10-CM | POA: Diagnosis not present

## 2019-04-19 DIAGNOSIS — I700 Atherosclerosis of aorta: Secondary | ICD-10-CM

## 2019-04-19 DIAGNOSIS — I10 Essential (primary) hypertension: Secondary | ICD-10-CM

## 2019-04-19 DIAGNOSIS — E782 Mixed hyperlipidemia: Secondary | ICD-10-CM

## 2019-04-19 DIAGNOSIS — IMO0002 Reserved for concepts with insufficient information to code with codable children: Secondary | ICD-10-CM

## 2019-04-19 MED ORDER — SUMATRIPTAN SUCCINATE 50 MG PO TABS: ORAL_TABLET | ORAL | 1 refills | Status: DC

## 2019-04-19 MED ORDER — CLONIDINE HCL 0.2 MG PO TABS: 0.2000 mg | ORAL_TABLET | Freq: Two times a day (BID) | ORAL | 3 refills | Status: DC

## 2019-04-19 MED ORDER — PRAVASTATIN SODIUM 20 MG PO TABS: 20.0000 mg | ORAL_TABLET | Freq: Every day | ORAL | 0 refills | Status: DC

## 2019-04-19 NOTE — Progress Notes (Signed)
Virtual Visit via telephone Note Due to COVID-19, visit is conducted virtually and was requested by patient. This visit type was conducted due to national recommendations for restrictions regarding the COVID-19 Pandemic (e.g. social distancing) in an effort to limit this patient's exposure and mitigate transmission in our community.  Due to her co-morbid illnesses, this patient is at least at moderate risk for complications without adequate follow up.  This format is felt to be most appropriate for this patient at this time.  All issues noted in this document were discussed and addressed.  A physical exam was not performed with this format.   I connected with Deborah Russo on 04/19/19 at 1440 by telephone and verified that I am speaking with the correct person using two identifiers. Deborah Russo is currently located at home and family is currently with them during visit. The provider, Monia Pouch, FNP is located in their office at time of visit.  I discussed the limitations, risks, security and privacy concerns of performing an evaluation and management service by telephone and the availability of in person appointments. I also discussed with the patient that there may be a patient responsible charge related to this service. The patient expressed understanding and agreed to proceed.  Subjective:  Patient ID: Deborah Russo, female    DOB: 04/10/1961, 58 y.o.   MRN: [id number]  Chief Complaint:  Hypertension   HPI: Deborah Russo is a 58 y.o. female presenting on 04/19/2019 for Hypertension   1. Essential hypertension Complaint with meds - Yes Checking BP at home ranging 125/85 Exercising Regularly - No Watching Salt intake - Yes Pertinent ROS:  Headache - yes, history of migraine headaches Chest pain - Yes Dyspnea - Yes Palpitations - Yes LE edema - Yes They report good compliance with medications and can restate their regimen by memory. No medication side  effects.  BP Readings from Last 3 Encounters:  02/06/19 (!) 162/88  09/21/18 (!) 164/86  06/24/18 (!) 91/59     2. Aortic atherosclerosis (HCC) Taking ASA and statin as prescribed. No chest pain, shortness of breath, abdominal pain, leg swelling, or palpitations.   3. Mixed hyperlipidemia Taking statin as prescribed without associated side effects. Does try to watch diet. Does not exercise on a regular basis  4. Chronic migraine 1-4 headaches per month that are well controlled with imitrex as needed. No aura. No photophobia, phonophobia, confusion, weakness, or visual disturbances. Does have nausea with the headaches.     Relevant past medical, surgical, family, and social history reviewed and updated as indicated.  Allergies and medications reviewed and updated.   Past Medical History:  Diagnosis Date   Chronic abdominal pain    since approximately 1991   Chronic migraine    Chronic pain    Hypothyroidism    Nausea, vomiting, and diarrhea    recurrent, chronic   Pain management    Sleep apnea    has CPAP, but doesn't wear it bc she says "i can't sleep with it on"   Stroke [facility])     Past Surgical History:  Procedure Laterality Date   ABDOMINAL HYSTERECTOMY     ABDOMINAL SURGERY     BIOPSY N/A 07/17/2014   Procedure: BIOPSY;  Surgeon: Rogene Houston, MD;  Location: [facility];  Service: Endoscopy;  Laterality: N/A;   BIOPSY  06/16/2018   Procedure: BIOPSY;  Surgeon: [name], MD;  Location: [facility];  Service: Endoscopy;;   BREAST SURGERY Left  removed nipple   CESAREAN SECTION     CHOLECYSTECTOMY     ESOPHAGOGASTRODUODENOSCOPY N/A 07/17/2014   Procedure: ESOPHAGOGASTRODUODENOSCOPY (EGD);  Surgeon: Rogene Houston, MD;  Location: [facility];  Service: Endoscopy;  Laterality: N/A;   FLEXIBLE SIGMOIDOSCOPY N/A 06/16/2018   Procedure: FLEXIBLE SIGMOIDOSCOPY;  Surgeon: [name], MD;  Location: [facility] ENDOSCOPY;  Service: Endoscopy;  Laterality:  N/A;    Social History   Socioeconomic History   Marital status: Divorced    Spouse name: Not on file   Number of children: Not on file   Years of education: Not on file   Highest education level: Not on file  Occupational History   Not on file  Social Needs   Financial resource strain: Not on file   Food insecurity:    Worry: Not on file    Inability: Not on file   Transportation needs:    Medical: Not on file    Non-medical: Not on file  Tobacco Use   Smoking status: Current Every Day Smoker    Packs/day: 1.00    Years: 41.00    Pack years: 41.00    Types: Cigarettes   Smokeless tobacco: Never Used  Substance and Sexual Activity   Alcohol use: No   Drug use: Yes    Frequency: 2.0 times per week    Types: Marijuana, Oxycodone   Sexual activity: Not Currently  Lifestyle   Physical activity:    Days per week: Not on file    Minutes per session: Not on file   Stress: Not on file  Relationships   Social connections:    Talks on phone: Not on file    Gets together: Not on file    Attends religious service: Not on file    Active member of club or organization: Not on file    Attends meetings of clubs or organizations: Not on file    Relationship status: Not on file   Intimate partner violence:    Fear of current or ex partner: Not on file    Emotionally abused: Not on file    Physically abused: Not on file    Forced sexual activity: Not on file  Other Topics Concern   Not on file  Social History Narrative   Not on file    Outpatient Encounter Medications as of 04/19/2019  Medication Sig   aspirin 325 MG tablet Take 1 tablet (325 mg total) by mouth daily.   Cholecalciferol (VITAMIN D) 50 MCG (2000 UT) CAPS Take 1 capsule (2,000 Units total) by mouth daily.   cholestyramine (QUESTRAN) 4 g packet Take 1 packet (4 g total) by mouth 2 (two) times daily.   cloNIDine (CATAPRES) 0.2 MG tablet Take 1 tablet (0.2 mg total) by mouth 2 (two) times  daily for 30 days.   diazepam (VALIUM) 5 MG tablet Take 5 mg by mouth every 8 (eight) hours as needed for anxiety.   diphenoxylate-atropine (LOMOTIL) 2.5-0.025 MG tablet Take 1 tablet by mouth 3 (three) times daily.   famotidine (PEPCID) 20 MG tablet Take 1 tablet (20 mg total) by mouth 2 (two) times daily.   fludrocortisone (FLORINEF) 0.1 MG tablet Take 1 tablet (0.1 mg total) by mouth daily.   folic acid (FOLVITE) 1 MG tablet Place 1 tablet (1 mg total) into feeding tube daily.   levothyroxine (SYNTHROID, LEVOTHROID) 150 MCG tablet Take 1 tablet (150 mcg total) by mouth daily.   loperamide (IMODIUM) 2 MG capsule Take 2 capsules (4  mg total) by mouth 2 (two) times daily.   Multiple Vitamin (MULTIVITAMIN WITH MINERALS) TABS tablet Take 1 tablet by mouth daily.   oxyCODONE-acetaminophen (PERCOCET/ROXICET) 5-325 MG tablet Take 1 tablet by mouth every 8 (eight) hours as needed for severe pain.   oxyCODONE-acetaminophen (PERCOCET/ROXICET) 5-325 MG tablet Take by mouth every 8 (eight) hours as needed for severe pain.   potassium chloride SA (KLOR-CON M20) 20 MEQ tablet Take 1 tablet (20 mEq total) by mouth daily.   pravastatin (PRAVACHOL) 20 MG tablet Take 1 tablet (20 mg total) by mouth daily.   SUMAtriptan (IMITREX) 50 MG tablet TAKE 1 TABLET AT ONSET OF HEADACHE, MAY REPEAT ONCE IN 2 HOURS. MAX 4PER DAY   tamsulosin (FLOMAX) 0.4 MG CAPS capsule Take 1 capsule (0.4 mg total) by mouth daily.   thiamine 100 MG tablet Take 1 tablet (100 mg total) by mouth daily.   vitamin B-12 (CYANOCOBALAMIN) 250 MCG tablet Take 1 tablet (250 mcg total) by mouth daily.   [DISCONTINUED] cloNIDine (CATAPRES) 0.2 MG tablet Take 1 tablet (0.2 mg total) by mouth 2 (two) times daily for 30 days.   [DISCONTINUED] pravastatin (PRAVACHOL) 20 MG tablet Take 1 tablet (20 mg total) by mouth daily.   [DISCONTINUED] SUMAtriptan (IMITREX) 50 MG tablet TAKE 1 TABLET AT ONSET OF HEADACHE, MAY REPEAT ONCE IN 2 HOURS.  MAX 4PER DAY   No facility-administered encounter medications on file as of 04/19/2019.     Allergies  Allergen Reactions   Penicillins Hives    *Tolerates zosyn Has patient had a PCN reaction causing immediate rash, facial/tongue/throat swelling, SOB or lightheadedness with hypotension: No Has patient had a PCN reaction causing severe rash involving mucus membranes or skin necrosis: No Has patient had a PCN reaction that required hospitalization No Has patient had a PCN reaction occurring within the last 10 years: No If all of the above answers are "NO", then may proceed with Cephalosporin use.    Sulfa Antibiotics Hives    Review of Systems  Constitutional: Negative for chills, fatigue, fever and unexpected weight change.  Eyes: Negative for photophobia and visual disturbance.  Respiratory: Negative for cough and shortness of breath.   Cardiovascular: Negative for chest pain, palpitations and leg swelling.  Endocrine: Negative for cold intolerance, heat intolerance, polydipsia, polyphagia and polyuria.  Musculoskeletal: Negative for arthralgias and myalgias.  Neurological: Positive for headaches. Negative for dizziness, tremors, seizures, syncope, facial asymmetry, speech difficulty, weakness, light-headedness and numbness.  Psychiatric/Behavioral: Negative for confusion.  All other systems reviewed and are negative.        Observations/Objective: No vital signs or physical exam, this was a telephone or virtual health encounter.  Pt alert and oriented, answers all questions appropriately, and able to speak in full sentences.    Assessment and Plan: Unknown was seen today for hypertension.  Diagnoses and all orders for this visit:  Essential hypertension Home readings averaging 125/85, continue below. Report any persistent highs or lows.  -     cloNIDine (CATAPRES) 0.2 MG tablet; Take 1 tablet (0.2 mg total) by mouth 2 (two) times daily for 30 days.  Aortic  atherosclerosis (HCC) Diet and exercise encouraged. Continue ASA daily. Medications as prescribed.  -     pravastatin (PRAVACHOL) 20 MG tablet; Take 1 tablet (20 mg total) by mouth daily.  Mixed hyperlipidemia Diet and exercise encouraged. Medications as prescribed. Labs in 3 months.  -     pravastatin (PRAVACHOL) 20 MG tablet; Take 1 tablet (20 mg total)  by mouth daily.  Chronic migraine Taking 1-4 times per month to abort migraine headache. Report any new or worsening symptoms. Continue below.  -     SUMAtriptan (IMITREX) 50 MG tablet; TAKE 1 TABLET AT ONSET OF HEADACHE, MAY REPEAT ONCE IN 2 HOURS. MAX 4PER DAY     Follow Up Instructions: Return in about 3 months (around 07/19/2019), or if symptoms worsen or fail to improve, for HTN, Hyperlipidemia, Migraine.    I discussed the assessment and treatment plan with the patient. The patient was provided an opportunity to ask questions and all were answered. The patient agreed with the plan and demonstrated an understanding of the instructions.   The patient was advised to call back or seek an in-person evaluation if the symptoms worsen or if the condition fails to improve as anticipated.  The above assessment and management plan was discussed with the patient. The patient verbalized understanding of and has agreed to the management plan. Patient is aware to call the clinic if symptoms persist or worsen. Patient is aware when to return to the clinic for a follow-up visit. Patient educated on when it is appropriate to go to the emergency department.    I provided 15 minutes of non-face-to-face time during this encounter. The call started at 1440. The call ended at 1455.   Monia Pouch, FNP-C [facility] [address] [place], [place] [address] [phone]

## 2019-04-23 ENCOUNTER — Ambulatory Visit: Payer: Medicare Other | Admitting: "Endocrinology

## 2019-05-24 ENCOUNTER — Telehealth: Payer: Self-pay | Admitting: Family Medicine

## 2019-08-09 ENCOUNTER — Other Ambulatory Visit: Payer: Self-pay | Admitting: Family Medicine

## 2019-08-09 DIAGNOSIS — IMO0002 Reserved for concepts with insufficient information to code with codable children: Secondary | ICD-10-CM

## 2019-08-09 DIAGNOSIS — G43709 Chronic migraine without aura, not intractable, without status migrainosus: Secondary | ICD-10-CM

## 2019-09-11 ENCOUNTER — Other Ambulatory Visit: Payer: Self-pay

## 2019-09-12 ENCOUNTER — Encounter: Payer: Self-pay | Admitting: Family Medicine

## 2019-09-12 ENCOUNTER — Ambulatory Visit (INDEPENDENT_AMBULATORY_CARE_PROVIDER_SITE_OTHER): Payer: Medicare Other | Admitting: Family Medicine

## 2019-09-12 ENCOUNTER — Other Ambulatory Visit: Payer: Self-pay

## 2019-09-12 DIAGNOSIS — I10 Essential (primary) hypertension: Secondary | ICD-10-CM

## 2019-09-12 MED ORDER — CLONIDINE HCL 0.2 MG PO TABS: 0.2000 mg | ORAL_TABLET | Freq: Two times a day (BID) | ORAL | 3 refills | Status: DC

## 2019-09-12 NOTE — Progress Notes (Signed)
Virtual Visit via telephone Note Due to COVID-19 pandemic this visit was conducted virtually. This visit type was conducted due to national recommendations for restrictions regarding the COVID-19 Pandemic (e.g. social distancing, sheltering in place) in an effort to limit this patient's exposure and mitigate transmission in our community. All issues noted in this document were discussed and addressed.  A physical exam was not performed with this format.   I connected with Deborah Russo on 09/12/19 at 1350 by telephone and verified that I am speaking with the correct person using two identifiers. Deborah Russo is currently located at home and no one is currently with them during visit. The provider, Monia Pouch, FNP is located in their office at time of visit.  I discussed the limitations, risks, security and privacy concerns of performing an evaluation and management service by telephone and the availability of in person appointments. I also discussed with the patient that there may be a patient responsible charge related to this service. The patient expressed understanding and agreed to proceed.  Subjective:  Patient ID: Deborah Russo, female    DOB: [birth year], 58 y.o.   MRN: [id number]  Chief Complaint:  Medical Management of Chronic Issues and Hypertension   HPI: Deborah Russo is a 58 y.o. female presenting on 09/12/2019 for Medical Management of Chronic Issues and Hypertension   Pt reports she needs a refill on her blood pressure medications. Pt has been unable to come into office due to COVID-19 pandemic. Pt aware she will need to be seen within the month to have labs updated. Pt agrees to this plan.   Hypertension This is a chronic problem. The current episode started more than 1 year ago. The problem is unchanged. The problem is controlled. Pertinent negatives include no blurred vision, chest pain, headaches, palpitations, peripheral edema or shortness of  breath. Risk factors for coronary artery disease include family history, dyslipidemia, obesity, sedentary lifestyle and stress. Compliance problems include diet and exercise.      Relevant past medical, surgical, family, and social history reviewed and updated as indicated.  Allergies and medications reviewed and updated.   Past Medical History:  Diagnosis Date  . Chronic abdominal pain    since approximately 1991  . Chronic migraine   . Chronic pain   . Hypothyroidism   . Nausea, vomiting, and diarrhea    recurrent, chronic  . Pain management   . Sleep apnea    has CPAP, but doesn't wear it bc she says "i can't sleep with it on"  . Stroke [facility])     Past Surgical History:  Procedure Laterality Date  . ABDOMINAL HYSTERECTOMY    . ABDOMINAL SURGERY    . BIOPSY N/A 07/17/2014   Procedure: BIOPSY;  Surgeon: Rogene Houston, MD;  Location: [facility];  Service: Endoscopy;  Laterality: N/A;  . BIOPSY  06/16/2018   Procedure: BIOPSY;  Surgeon: [name], MD;  Location: [facility];  Service: Endoscopy;;  . BREAST SURGERY Left    removed nipple  . CESAREAN SECTION    . CHOLECYSTECTOMY    . ESOPHAGOGASTRODUODENOSCOPY N/A 07/17/2014   Procedure: ESOPHAGOGASTRODUODENOSCOPY (EGD);  Surgeon: Rogene Houston, MD;  Location: [facility];  Service: Endoscopy;  Laterality: N/A;  . FLEXIBLE SIGMOIDOSCOPY N/A 06/16/2018   Procedure: FLEXIBLE SIGMOIDOSCOPY;  Surgeon: [name], MD;  Location: [facility] END[facility]COPY;  Service: Endoscopy;  Laterality: N/A;    Social History   Socioeconomic History  . Marital status: Divorced  Spouse name: Not on file  . Number of children: Not on file  . Years of education: Not on file  . Highest education level: Not on file  Occupational History  . Not on file  Social Needs  . Financial resource strain: Not on file  . Food insecurity    Worry: Not on file    Inability: Not on file  . Transportation needs    Medical: Not on file    Non-medical: Not on  file  Tobacco Use  . Smoking status: Current Every Day Smoker    Packs/day: 1.00    Years: 41.00    Pack years: 41.00    Types: Cigarettes  . Smokeless tobacco: Never Used  Substance and Sexual Activity  . Alcohol use: No  . Drug use: Yes    Frequency: 2.0 times per week    Types: Marijuana, Oxycodone  . Sexual activity: Not Currently  Lifestyle  . Physical activity    Days per week: Not on file    Minutes per session: Not on file  . Stress: Not on file  Relationships  . Social Herbalist on phone: Not on file    Gets together: Not on file    Attends religious service: Not on file    Active member of club or organization: Not on file    Attends meetings of clubs or organizations: Not on file    Relationship status: Not on file  . Intimate partner violence    Fear of current or ex partner: Not on file    Emotionally abused: Not on file    Physically abused: Not on file    Forced sexual activity: Not on file  Other Topics Concern  . Not on file  Social History Narrative  . Not on file    Outpatient Encounter Medications as of 09/12/2019  Medication Sig  . aspirin 325 MG tablet Take 1 tablet (325 mg total) by mouth daily.  . cholestyramine (QUESTRAN) 4 g packet Take 1 packet (4 g total) by mouth 2 (two) times daily.  . cloNIDine (CATAPRES) 0.2 MG tablet Take 1 tablet (0.2 mg total) by mouth 2 (two) times daily.  . diazepam (VALIUM) 5 MG tablet Take 5 mg by mouth every 8 (eight) hours as needed for anxiety.  . diphenoxylate-atropine (LOMOTIL) 2.5-0.025 MG tablet Take 1 tablet by mouth 3 (three) times daily.  . famotidine (PEPCID) 20 MG tablet Take 1 tablet (20 mg total) by mouth 2 (two) times daily.  . fludrocortisone (FLORINEF) 0.1 MG tablet Take 1 tablet (0.1 mg total) by mouth daily.  . folic acid (FOLVITE) 1 MG tablet Place 1 tablet (1 mg total) into feeding tube daily.  Marland Kitchen levothyroxine (SYNTHROID, LEVOTHROID) 150 MCG tablet Take 1 tablet (150 mcg total) by  mouth daily.  Marland Kitchen loperamide (IMODIUM) 2 MG capsule Take 2 capsules (4 mg total) by mouth 2 (two) times daily.  . Multiple Vitamin (MULTIVITAMIN WITH MINERALS) TABS tablet Take 1 tablet by mouth daily.  Marland Kitchen oxyCODONE-acetaminophen (PERCOCET/ROXICET) 5-325 MG tablet Take 1 tablet by mouth every 8 (eight) hours as needed for severe pain.  Marland Kitchen oxyCODONE-acetaminophen (PERCOCET/ROXICET) 5-325 MG tablet Take by mouth every 8 (eight) hours as needed for severe pain.  . potassium chloride SA (KLOR-CON M20) 20 MEQ tablet Take 1 tablet (20 mEq total) by mouth daily.  . pravastatin (PRAVACHOL) 20 MG tablet Take 1 tablet (20 mg total) by mouth daily.  . SUMAtriptan (IMITREX) 50 MG tablet  TAKE 1 TABLET AT ONSET OF HEADACHE, MAY REPEAT ONCE IN 2 HOURS. MAX 4PER DAY  . tamsulosin (FLOMAX) 0.4 MG CAPS capsule Take 1 capsule (0.4 mg total) by mouth daily.  Marland Kitchen thiamine 100 MG tablet Take 1 tablet (100 mg total) by mouth daily.  . vitamin B-12 (CYANOCOBALAMIN) 250 MCG tablet Take 1 tablet (250 mcg total) by mouth daily.  . [DISCONTINUED] cloNIDine (CATAPRES) 0.2 MG tablet Take 1 tablet (0.2 mg total) by mouth 2 (two) times daily for 30 days.   No facility-administered encounter medications on file as of 09/12/2019.     Allergies  Allergen Reactions  . Penicillins Hives    *Tolerates zosyn Has patient had a PCN reaction causing immediate rash, facial/tongue/throat swelling, SOB or lightheadedness with hypotension: No Has patient had a PCN reaction causing severe rash involving mucus membranes or skin necrosis: No Has patient had a PCN reaction that required hospitalization No Has patient had a PCN reaction occurring within the last 10 years: No If all of the above answers are "NO", then may proceed with Cephalosporin use.   . Sulfa Antibiotics Hives    Review of Systems  Constitutional: Negative for activity change, appetite change, chills, fatigue and fever.  HENT: Negative.   Eyes: Negative.  Negative for  blurred vision, photophobia and visual disturbance.  Respiratory: Negative for cough, chest tightness and shortness of breath.   Cardiovascular: Negative for chest pain, palpitations and leg swelling.  Gastrointestinal: Negative for blood in stool, constipation, diarrhea, nausea and vomiting.  Endocrine: Negative.   Genitourinary: Negative for decreased urine volume, difficulty urinating, dysuria, frequency and urgency.  Musculoskeletal: Negative for arthralgias and myalgias.  Skin: Negative.   Allergic/Immunologic: Negative.   Neurological: Negative for dizziness, tremors, seizures, syncope, facial asymmetry, speech difficulty, weakness, light-headedness, numbness and headaches.  Hematological: Negative.   Psychiatric/Behavioral: Negative for confusion, hallucinations, sleep disturbance and suicidal ideas.  All other systems reviewed and are negative.        Observations/Objective: No vital signs or physical exam, this was a telephone or virtual health encounter.  Pt alert and oriented, answers all questions appropriately, and able to speak in full sentences.    Assessment and Plan: Megen was seen today for medical management of chronic issues and hypertension.  Diagnoses and all orders for this visit:  Essential hypertension Pt aware she needs to be seen in office to update labs. DASH diet and exercise encouraged. Continue below. Follow up in 1 month.  -     cloNIDine (CATAPRES) 0.2 MG tablet; Take 1 tablet (0.2 mg total) by mouth 2 (two) times daily.     Follow Up Instructions: Return in about 4 weeks (around 10/10/2019), or if symptoms worsen or fail to improve, for HTN, Thyroid, labs.    I discussed the assessment and treatment plan with the patient. The patient was provided an opportunity to ask questions and all were answered. The patient agreed with the plan and demonstrated an understanding of the instructions.   The patient was advised to call back or seek an  in-person evaluation if the symptoms worsen or if the condition fails to improve as anticipated.  The above assessment and management plan was discussed with the patient. The patient verbalized understanding of and has agreed to the management plan. Patient is aware to call the clinic if they develop any new symptoms or if symptoms persist or worsen. Patient is aware when to return to the clinic for a follow-up visit. Patient educated on when  it is appropriate to go to the emergency department.    I provided 15 minutes of non-face-to-face time during this encounter. The call started at 1350. The call ended at 1400. The other time was used for coordination of care.    Monia Pouch, FNP-C [facility] [address] [place], [place] [address] [phone] 09/12/19

## 2019-10-03 ENCOUNTER — Ambulatory Visit (INDEPENDENT_AMBULATORY_CARE_PROVIDER_SITE_OTHER): Payer: Medicare Other | Admitting: Family Medicine

## 2019-10-03 DIAGNOSIS — Z91199 Patient's noncompliance with other medical treatment and regimen due to unspecified reason: Secondary | ICD-10-CM

## 2019-10-03 DIAGNOSIS — Z5329 Procedure and treatment not carried out because of patient's decision for other reasons: Secondary | ICD-10-CM

## 2019-10-03 NOTE — Progress Notes (Signed)
Attempted to call x 2, no answer.

## 2019-10-03 NOTE — Progress Notes (Signed)
Attempted to call x 4 without answer.

## 2019-11-20 ENCOUNTER — Other Ambulatory Visit: Payer: Self-pay | Admitting: Family Medicine

## 2019-11-20 DIAGNOSIS — IMO0002 Reserved for concepts with insufficient information to code with codable children: Secondary | ICD-10-CM

## 2019-11-20 DIAGNOSIS — I700 Atherosclerosis of aorta: Secondary | ICD-10-CM

## 2019-11-20 DIAGNOSIS — G43709 Chronic migraine without aura, not intractable, without status migrainosus: Secondary | ICD-10-CM

## 2019-11-20 DIAGNOSIS — E782 Mixed hyperlipidemia: Secondary | ICD-10-CM

## 2019-11-21 ENCOUNTER — Other Ambulatory Visit: Payer: Self-pay | Admitting: Family Medicine

## 2019-11-21 DIAGNOSIS — I700 Atherosclerosis of aorta: Secondary | ICD-10-CM

## 2019-11-21 DIAGNOSIS — E782 Mixed hyperlipidemia: Secondary | ICD-10-CM

## 2020-02-09 ENCOUNTER — Other Ambulatory Visit: Payer: Self-pay | Admitting: Family Medicine

## 2020-02-09 DIAGNOSIS — E034 Atrophy of thyroid (acquired): Secondary | ICD-10-CM

## 2020-02-09 DIAGNOSIS — E782 Mixed hyperlipidemia: Secondary | ICD-10-CM

## 2020-02-09 DIAGNOSIS — I700 Atherosclerosis of aorta: Secondary | ICD-10-CM

## 2020-04-08 ENCOUNTER — Other Ambulatory Visit: Payer: Self-pay | Admitting: Family Medicine

## 2020-04-08 DIAGNOSIS — I10 Essential (primary) hypertension: Secondary | ICD-10-CM

## 2020-05-20 ENCOUNTER — Other Ambulatory Visit: Payer: Self-pay

## 2020-05-20 DIAGNOSIS — I10 Essential (primary) hypertension: Secondary | ICD-10-CM

## 2020-05-20 MED ORDER — CLONIDINE HCL 0.2 MG PO TABS: 0.2000 mg | ORAL_TABLET | Freq: Two times a day (BID) | ORAL | 0 refills | Status: DC

## 2020-06-13 DIAGNOSIS — R1013 Epigastric pain: Secondary | ICD-10-CM | POA: Diagnosis not present

## 2020-06-13 DIAGNOSIS — G4459 Other complicated headache syndrome: Secondary | ICD-10-CM | POA: Diagnosis not present

## 2020-08-28 ENCOUNTER — Encounter (HOSPITAL_COMMUNITY): Payer: Self-pay | Admitting: Emergency Medicine

## 2020-08-28 ENCOUNTER — Inpatient Hospital Stay (HOSPITAL_COMMUNITY)
Admission: EM | Admit: 2020-08-28 | Discharge: 2020-09-09 | DRG: 871 | Disposition: A | Discharge status: Skilled Nursing Facility | Payer: Medicare HMO | Attending: Internal Medicine | Admitting: Internal Medicine

## 2020-08-28 ENCOUNTER — Other Ambulatory Visit: Payer: Self-pay

## 2020-08-28 ENCOUNTER — Emergency Department (HOSPITAL_COMMUNITY): Payer: Medicare HMO

## 2020-08-28 DIAGNOSIS — R9431 Abnormal electrocardiogram [ECG] [EKG]: Secondary | ICD-10-CM | POA: Diagnosis not present

## 2020-08-28 DIAGNOSIS — E872 Acidosis: Secondary | ICD-10-CM | POA: Diagnosis present

## 2020-08-28 DIAGNOSIS — S065X9A Traumatic subdural hemorrhage with loss of consciousness of unspecified duration, initial encounter: Secondary | ICD-10-CM

## 2020-08-28 DIAGNOSIS — I700 Atherosclerosis of aorta: Secondary | ICD-10-CM | POA: Diagnosis present

## 2020-08-28 DIAGNOSIS — R197 Diarrhea, unspecified: Secondary | ICD-10-CM | POA: Diagnosis not present

## 2020-08-28 DIAGNOSIS — Z8619 Personal history of other infectious and parasitic diseases: Secondary | ICD-10-CM

## 2020-08-28 DIAGNOSIS — E785 Hyperlipidemia, unspecified: Secondary | ICD-10-CM | POA: Diagnosis not present

## 2020-08-28 DIAGNOSIS — R7303 Prediabetes: Secondary | ICD-10-CM | POA: Diagnosis not present

## 2020-08-28 DIAGNOSIS — R188 Other ascites: Secondary | ICD-10-CM | POA: Diagnosis present

## 2020-08-28 DIAGNOSIS — G43909 Migraine, unspecified, not intractable, without status migrainosus: Secondary | ICD-10-CM | POA: Diagnosis not present

## 2020-08-28 DIAGNOSIS — K7460 Unspecified cirrhosis of liver: Secondary | ICD-10-CM | POA: Diagnosis not present

## 2020-08-28 DIAGNOSIS — G4733 Obstructive sleep apnea (adult) (pediatric): Secondary | ICD-10-CM | POA: Diagnosis present

## 2020-08-28 DIAGNOSIS — S065X0A Traumatic subdural hemorrhage without loss of consciousness, initial encounter: Secondary | ICD-10-CM | POA: Diagnosis not present

## 2020-08-28 DIAGNOSIS — E8809 Other disorders of plasma-protein metabolism, not elsewhere classified: Secondary | ICD-10-CM | POA: Diagnosis present

## 2020-08-28 DIAGNOSIS — R6520 Severe sepsis without septic shock: Secondary | ICD-10-CM | POA: Diagnosis not present

## 2020-08-28 DIAGNOSIS — K760 Fatty (change of) liver, not elsewhere classified: Secondary | ICD-10-CM | POA: Diagnosis present

## 2020-08-28 DIAGNOSIS — R1110 Vomiting, unspecified: Secondary | ICD-10-CM | POA: Diagnosis not present

## 2020-08-28 DIAGNOSIS — S065XAA Traumatic subdural hemorrhage with loss of consciousness status unknown, initial encounter: Secondary | ICD-10-CM | POA: Insufficient documentation

## 2020-08-28 DIAGNOSIS — R0902 Hypoxemia: Secondary | ICD-10-CM | POA: Diagnosis not present

## 2020-08-28 DIAGNOSIS — R161 Splenomegaly, not elsewhere classified: Secondary | ICD-10-CM | POA: Diagnosis not present

## 2020-08-28 DIAGNOSIS — F17210 Nicotine dependence, cigarettes, uncomplicated: Secondary | ICD-10-CM | POA: Diagnosis present

## 2020-08-28 DIAGNOSIS — I248 Other forms of acute ischemic heart disease: Secondary | ICD-10-CM | POA: Diagnosis present

## 2020-08-28 DIAGNOSIS — F17200 Nicotine dependence, unspecified, uncomplicated: Secondary | ICD-10-CM | POA: Diagnosis not present

## 2020-08-28 DIAGNOSIS — K219 Gastro-esophageal reflux disease without esophagitis: Secondary | ICD-10-CM | POA: Diagnosis present

## 2020-08-28 DIAGNOSIS — Z79899 Other long term (current) drug therapy: Secondary | ICD-10-CM

## 2020-08-28 DIAGNOSIS — D696 Thrombocytopenia, unspecified: Secondary | ICD-10-CM | POA: Diagnosis present

## 2020-08-28 DIAGNOSIS — Z825 Family history of asthma and other chronic lower respiratory diseases: Secondary | ICD-10-CM

## 2020-08-28 DIAGNOSIS — F419 Anxiety disorder, unspecified: Secondary | ICD-10-CM | POA: Diagnosis not present

## 2020-08-28 DIAGNOSIS — Z7982 Long term (current) use of aspirin: Secondary | ICD-10-CM

## 2020-08-28 DIAGNOSIS — I6201 Nontraumatic acute subdural hemorrhage: Secondary | ICD-10-CM | POA: Diagnosis present

## 2020-08-28 DIAGNOSIS — R509 Fever, unspecified: Secondary | ICD-10-CM | POA: Diagnosis not present

## 2020-08-28 DIAGNOSIS — Z809 Family history of malignant neoplasm, unspecified: Secondary | ICD-10-CM

## 2020-08-28 DIAGNOSIS — Z880 Allergy status to penicillin: Secondary | ICD-10-CM

## 2020-08-28 DIAGNOSIS — I10 Essential (primary) hypertension: Secondary | ICD-10-CM | POA: Diagnosis not present

## 2020-08-28 DIAGNOSIS — I959 Hypotension, unspecified: Secondary | ICD-10-CM | POA: Diagnosis present

## 2020-08-28 DIAGNOSIS — R4182 Altered mental status, unspecified: Secondary | ICD-10-CM

## 2020-08-28 DIAGNOSIS — N200 Calculus of kidney: Secondary | ICD-10-CM | POA: Diagnosis not present

## 2020-08-28 DIAGNOSIS — E039 Hypothyroidism, unspecified: Secondary | ICD-10-CM | POA: Diagnosis present

## 2020-08-28 DIAGNOSIS — I69351 Hemiplegia and hemiparesis following cerebral infarction affecting right dominant side: Secondary | ICD-10-CM | POA: Diagnosis not present

## 2020-08-28 DIAGNOSIS — R739 Hyperglycemia, unspecified: Secondary | ICD-10-CM | POA: Diagnosis present

## 2020-08-28 DIAGNOSIS — R52 Pain, unspecified: Secondary | ICD-10-CM

## 2020-08-28 DIAGNOSIS — Z7401 Bed confinement status: Secondary | ICD-10-CM | POA: Diagnosis not present

## 2020-08-28 DIAGNOSIS — A419 Sepsis, unspecified organism: Secondary | ICD-10-CM | POA: Diagnosis not present

## 2020-08-28 DIAGNOSIS — K529 Noninfective gastroenteritis and colitis, unspecified: Secondary | ICD-10-CM | POA: Diagnosis not present

## 2020-08-28 DIAGNOSIS — L89322 Pressure ulcer of left buttock, stage 2: Secondary | ICD-10-CM | POA: Diagnosis not present

## 2020-08-28 DIAGNOSIS — K5520 Angiodysplasia of colon without hemorrhage: Secondary | ICD-10-CM | POA: Diagnosis present

## 2020-08-28 DIAGNOSIS — E034 Atrophy of thyroid (acquired): Secondary | ICD-10-CM | POA: Diagnosis not present

## 2020-08-28 DIAGNOSIS — E538 Deficiency of other specified B group vitamins: Secondary | ICD-10-CM | POA: Diagnosis present

## 2020-08-28 DIAGNOSIS — I491 Atrial premature depolarization: Secondary | ICD-10-CM | POA: Diagnosis not present

## 2020-08-28 DIAGNOSIS — E782 Mixed hyperlipidemia: Secondary | ICD-10-CM | POA: Diagnosis present

## 2020-08-28 DIAGNOSIS — E876 Hypokalemia: Secondary | ICD-10-CM | POA: Diagnosis not present

## 2020-08-28 DIAGNOSIS — I6389 Other cerebral infarction: Secondary | ICD-10-CM | POA: Diagnosis not present

## 2020-08-28 DIAGNOSIS — G9341 Metabolic encephalopathy: Secondary | ICD-10-CM | POA: Diagnosis not present

## 2020-08-28 DIAGNOSIS — G8929 Other chronic pain: Secondary | ICD-10-CM | POA: Diagnosis present

## 2020-08-28 DIAGNOSIS — R5381 Other malaise: Secondary | ICD-10-CM | POA: Diagnosis present

## 2020-08-28 DIAGNOSIS — E860 Dehydration: Secondary | ICD-10-CM | POA: Diagnosis present

## 2020-08-28 DIAGNOSIS — R627 Adult failure to thrive: Secondary | ICD-10-CM

## 2020-08-28 DIAGNOSIS — R0689 Other abnormalities of breathing: Secondary | ICD-10-CM | POA: Diagnosis not present

## 2020-08-28 DIAGNOSIS — Z9049 Acquired absence of other specified parts of digestive tract: Secondary | ICD-10-CM | POA: Diagnosis not present

## 2020-08-28 DIAGNOSIS — K6389 Other specified diseases of intestine: Secondary | ICD-10-CM | POA: Diagnosis not present

## 2020-08-28 DIAGNOSIS — Z20822 Contact with and (suspected) exposure to covid-19: Secondary | ICD-10-CM | POA: Diagnosis present

## 2020-08-28 DIAGNOSIS — K7581 Nonalcoholic steatohepatitis (NASH): Secondary | ICD-10-CM | POA: Diagnosis present

## 2020-08-28 DIAGNOSIS — Z882 Allergy status to sulfonamides status: Secondary | ICD-10-CM

## 2020-08-28 DIAGNOSIS — Z79890 Hormone replacement therapy (postmenopausal): Secondary | ICD-10-CM

## 2020-08-28 DIAGNOSIS — R404 Transient alteration of awareness: Secondary | ICD-10-CM | POA: Diagnosis not present

## 2020-08-28 DIAGNOSIS — K6289 Other specified diseases of anus and rectum: Secondary | ICD-10-CM | POA: Diagnosis not present

## 2020-08-28 DIAGNOSIS — R531 Weakness: Secondary | ICD-10-CM | POA: Diagnosis not present

## 2020-08-28 DIAGNOSIS — D7589 Other specified diseases of blood and blood-forming organs: Secondary | ICD-10-CM | POA: Diagnosis present

## 2020-08-28 DIAGNOSIS — R269 Unspecified abnormalities of gait and mobility: Secondary | ICD-10-CM | POA: Diagnosis present

## 2020-08-28 LAB — URINALYSIS, ROUTINE W REFLEX MICROSCOPIC
Bilirubin Urine: NEGATIVE
Glucose, UA: NEGATIVE mg/dL
Ketones, ur: 20 mg/dL — AB
Nitrite: NEGATIVE
Protein, ur: 30 mg/dL — AB
Specific Gravity, Urine: 1.012 (ref 1.005–1.030)
pH: 6 (ref 5.0–8.0)

## 2020-08-28 LAB — COMPREHENSIVE METABOLIC PANEL
ALT: 23 U/L (ref 0–44)
AST: 32 U/L (ref 15–41)
Albumin: 4 g/dL (ref 3.5–5.0)
Alkaline Phosphatase: 107 U/L (ref 38–126)
Anion gap: 19 — ABNORMAL HIGH (ref 5–15)
BUN: 19 mg/dL (ref 8–23)
CO2: 29 mmol/L (ref 22–32)
Calcium: 9.2 mg/dL (ref 8.9–10.3)
Chloride: 87 mmol/L — ABNORMAL LOW (ref 98–111)
Creatinine, Ser: 1.08 mg/dL — ABNORMAL HIGH (ref 0.44–1.00)
GFR calc Af Amer: >60 mL/min (ref >60)
GFR calc non Af Amer: 55 mL/min — ABNORMAL LOW (ref >60)
Glucose, Bld: 208 mg/dL — ABNORMAL HIGH (ref 70–99)
Potassium: <2 mmol/L — CL (ref 3.5–5.1)
Sodium: 135 mmol/L (ref 135–145)
Total Bilirubin: 2.3 mg/dL — ABNORMAL HIGH (ref 0.3–1.2)
Total Protein: 8.4 g/dL — ABNORMAL HIGH (ref 6.5–8.1)

## 2020-08-28 LAB — I-STAT CHEM 8, ED
BUN: 26 mg/dL — ABNORMAL HIGH (ref 8–23)
Calcium, Ion: 0.91 mmol/L — ABNORMAL LOW (ref 1.15–1.40)
Chloride: 90 mmol/L — ABNORMAL LOW (ref 98–111)
Creatinine, Ser: 1 mg/dL (ref 0.44–1.00)
Glucose, Bld: 211 mg/dL — ABNORMAL HIGH (ref 70–99)
HCT: 56 % — ABNORMAL HIGH (ref 36.0–46.0)
Hemoglobin: 19 g/dL — ABNORMAL HIGH (ref 12.0–15.0)
Potassium: 4.4 mmol/L (ref 3.5–5.1)
Sodium: 135 mmol/L (ref 135–145)
TCO2: 35 mmol/L — ABNORMAL HIGH (ref 22–32)

## 2020-08-28 LAB — MAGNESIUM: Magnesium: 2.2 mg/dL (ref 1.7–2.4)

## 2020-08-28 LAB — CBC WITH DIFFERENTIAL/PLATELET
Abs Immature Granulocytes: 0.05 10*3/uL (ref 0.00–0.07)
Basophils Absolute: 0 10*3/uL (ref 0.0–0.1)
Basophils Relative: 0 %
Eosinophils Absolute: 0 10*3/uL (ref 0.0–0.5)
Eosinophils Relative: 0 %
HCT: 50.4 % — ABNORMAL HIGH (ref 36.0–46.0)
Hemoglobin: 17.6 g/dL — ABNORMAL HIGH (ref 12.0–15.0)
Immature Granulocytes: 1 %
Lymphocytes Relative: 20 %
Lymphs Abs: 1.6 10*3/uL (ref 0.7–4.0)
MCH: 36.1 pg — ABNORMAL HIGH (ref 26.0–34.0)
MCHC: 34.9 g/dL (ref 30.0–36.0)
MCV: 103.5 fL — ABNORMAL HIGH (ref 80.0–100.0)
Monocytes Absolute: 0.3 10*3/uL (ref 0.1–1.0)
Monocytes Relative: 4 %
Neutro Abs: 6.4 10*3/uL (ref 1.7–7.7)
Neutrophils Relative %: 75 %
Platelets: 157 10*3/uL (ref 150–400)
RBC: 4.87 MIL/uL (ref 3.87–5.11)
RDW: 17.5 % — ABNORMAL HIGH (ref 11.5–15.5)
WBC: 8.4 10*3/uL (ref 4.0–10.5)
nRBC: 0 % (ref 0.0–0.2)

## 2020-08-28 LAB — RAPID URINE DRUG SCREEN, HOSP PERFORMED
Amphetamines: NOT DETECTED
Barbiturates: NOT DETECTED
Benzodiazepines: POSITIVE — AB
Cocaine: NOT DETECTED
Opiates: NOT DETECTED
Tetrahydrocannabinol: NOT DETECTED

## 2020-08-28 LAB — HEMOGLOBIN A1C
Hgb A1c MFr Bld: 5.6 % (ref 4.8–5.6)
Mean Plasma Glucose: 114.02 mg/dL

## 2020-08-28 LAB — VITAMIN B12: Vitamin B-12: 187 pg/mL (ref 180–914)

## 2020-08-28 LAB — FOLATE: Folate: 5.3 ng/mL — ABNORMAL LOW (ref >5.9)

## 2020-08-28 LAB — GLUCOSE, CAPILLARY
Glucose-Capillary: 226 mg/dL — ABNORMAL HIGH (ref 70–99)
Glucose-Capillary: 208 mg/dL — ABNORMAL HIGH (ref 70–99)

## 2020-08-28 LAB — BILIRUBIN, FRACTIONATED(TOT/DIR/INDIR)
Bilirubin, Direct: 0.7 mg/dL — ABNORMAL HIGH (ref 0.0–0.2)
Indirect Bilirubin: 1.4 mg/dL — ABNORMAL HIGH (ref 0.3–0.9)
Total Bilirubin: 2.1 mg/dL — ABNORMAL HIGH (ref 0.3–1.2)

## 2020-08-28 LAB — C DIFFICILE QUICK SCREEN W PCR REFLEX
C Diff antigen: NEGATIVE
C Diff interpretation: NOT DETECTED
C Diff toxin: NEGATIVE

## 2020-08-28 LAB — SARS CORONAVIRUS 2 BY RT PCR (HOSPITAL ORDER, PERFORMED IN ~~LOC~~ HOSPITAL LAB): SARS Coronavirus 2: NEGATIVE

## 2020-08-28 LAB — POTASSIUM: Potassium: <2 mmol/L — CL (ref 3.5–5.1)

## 2020-08-28 LAB — TROPONIN I (HIGH SENSITIVITY)
Troponin I (High Sensitivity): 34 ng/L — ABNORMAL HIGH (ref <18)
Troponin I (High Sensitivity): 29 ng/L — ABNORMAL HIGH (ref <18)

## 2020-08-28 LAB — PROCALCITONIN: Procalcitonin: <0.1 ng/mL

## 2020-08-28 LAB — LACTIC ACID, PLASMA: Lactic Acid, Venous: 2.3 mmol/L (ref 0.5–1.9)

## 2020-08-28 LAB — CBG MONITORING, ED: Glucose-Capillary: 210 mg/dL — ABNORMAL HIGH (ref 70–99)

## 2020-08-28 LAB — AMMONIA: Ammonia: 43 umol/L — ABNORMAL HIGH (ref 9–35)

## 2020-08-28 LAB — TSH: TSH: 20.018 u[IU]/mL — ABNORMAL HIGH (ref 0.350–4.500)

## 2020-08-28 LAB — CK: Total CK: 177 U/L (ref 38–234)

## 2020-08-28 MED ORDER — POTASSIUM CHLORIDE CRYS ER 20 MEQ PO TBCR
20.0000 meq | EXTENDED_RELEASE_TABLET | Freq: Every day | ORAL | Status: DC
  Filled 2020-08-28: qty 1

## 2020-08-28 MED ORDER — ONDANSETRON HCL 4 MG PO TABS
4.0000 mg | ORAL_TABLET | Freq: Four times a day (QID) | ORAL | Status: DC | PRN
  Administered 2020-08-29 – 2020-09-08 (×3): 4 mg via ORAL
  Filled 2020-08-28 (×3): qty 1

## 2020-08-28 MED ORDER — LEVOTHYROXINE SODIUM 100 MCG/5ML IV SOLN: 75.0000 ug | Freq: Every day | INTRAVENOUS | Status: DC

## 2020-08-28 MED ORDER — ACETAMINOPHEN 325 MG PO TABS
650.0000 mg | ORAL_TABLET | Freq: Four times a day (QID) | ORAL | Status: DC | PRN
  Administered 2020-08-29 – 2020-09-02 (×2): 650 mg via ORAL
  Filled 2020-08-28 (×2): qty 2

## 2020-08-28 MED ORDER — POTASSIUM CHLORIDE IN NACL 40-0.9 MEQ/L-% IV SOLN
INTRAVENOUS | Status: DC
  Filled 2020-08-28 (×4): qty 1000

## 2020-08-28 MED ORDER — THIAMINE HCL 100 MG PO TABS
100.0000 mg | ORAL_TABLET | Freq: Every day | ORAL | Status: DC
  Administered 2020-08-29 – 2020-09-09 (×11): 100 mg via ORAL
  Filled 2020-08-28 (×11): qty 1

## 2020-08-28 MED ORDER — ORAL CARE MOUTH RINSE
15.0000 mL | Freq: Two times a day (BID) | OROMUCOSAL | Status: DC
  Administered 2020-08-28 – 2020-09-08 (×20): 15 mL via OROMUCOSAL

## 2020-08-28 MED ORDER — SODIUM CHLORIDE 0.9 % IV SOLN: 1.0000 mg | Freq: Every day | INTRAVENOUS | Status: DC

## 2020-08-28 MED ORDER — HYDROCORTISONE NA SUCCINATE PF 100 MG IJ SOLR
100.0000 mg | Freq: Once | INTRAMUSCULAR | Status: AC
  Administered 2020-08-28: 100 mg via INTRAVENOUS
  Filled 2020-08-28: qty 2

## 2020-08-28 MED ORDER — POTASSIUM CHLORIDE 10 MEQ/100ML IV SOLN
10.0000 meq | Freq: Once | INTRAVENOUS | Status: AC
  Administered 2020-08-28: 10 meq via INTRAVENOUS
  Filled 2020-08-28: qty 100

## 2020-08-28 MED ORDER — ONDANSETRON HCL 4 MG/2ML IJ SOLN
4.0000 mg | Freq: Four times a day (QID) | INTRAMUSCULAR | Status: DC | PRN
  Administered 2020-08-29 – 2020-08-30 (×2): 4 mg via INTRAVENOUS
  Filled 2020-08-28 (×3): qty 2

## 2020-08-28 MED ORDER — FLUDROCORTISONE ACETATE 0.1 MG PO TABS
0.1000 mg | ORAL_TABLET | Freq: Every day | ORAL | Status: DC
  Administered 2020-08-29: 0.1 mg via ORAL
  Filled 2020-08-28: qty 1

## 2020-08-28 MED ORDER — LACTATED RINGERS IV BOLUS
1000.0000 mL | Freq: Once | INTRAVENOUS | Status: AC
  Administered 2020-08-28: 1000 mL via INTRAVENOUS

## 2020-08-28 MED ORDER — FOLIC ACID 1 MG PO TABS: 1.0000 mg | ORAL_TABLET | Freq: Every day | ORAL | Status: DC

## 2020-08-28 MED ORDER — ACETAMINOPHEN 650 MG RE SUPP: 650.0000 mg | Freq: Four times a day (QID) | RECTAL | Status: DC | PRN

## 2020-08-28 MED ORDER — POTASSIUM CHLORIDE 10 MEQ/100ML IV SOLN: 10.0000 meq | INTRAVENOUS | Status: DC

## 2020-08-28 MED ORDER — FOLIC ACID 5 MG/ML IJ SOLN
1.0000 mg | Freq: Every day | INTRAMUSCULAR | Status: DC
  Administered 2020-08-29 – 2020-09-04 (×7): 1 mg via INTRAVENOUS
  Filled 2020-08-28 (×7): qty 0.2

## 2020-08-28 MED ORDER — POTASSIUM CHLORIDE CRYS ER 20 MEQ PO TBCR
40.0000 meq | EXTENDED_RELEASE_TABLET | Freq: Once | ORAL | Status: DC
  Filled 2020-08-28: qty 2

## 2020-08-28 MED ORDER — POTASSIUM CHLORIDE 10 MEQ/100ML IV SOLN
10.0000 meq | INTRAVENOUS | Status: AC
  Administered 2020-08-28 (×2): 10 meq via INTRAVENOUS
  Filled 2020-08-28 (×2): qty 100

## 2020-08-28 MED ORDER — POTASSIUM CHLORIDE 10 MEQ/100ML IV SOLN
10.0000 meq | INTRAVENOUS | Status: AC
  Administered 2020-08-28 (×4): 10 meq via INTRAVENOUS
  Filled 2020-08-28 (×4): qty 100

## 2020-08-28 MED ORDER — LEVOTHYROXINE SODIUM 100 MCG/5ML IV SOLN
75.0000 ug | Freq: Every day | INTRAVENOUS | Status: DC
  Administered 2020-08-29 – 2020-09-02 (×5): 75 ug via INTRAVENOUS
  Filled 2020-08-28 (×5): qty 5

## 2020-08-28 MED ORDER — VITAMIN B-12 100 MCG PO TABS
250.0000 ug | ORAL_TABLET | Freq: Every day | ORAL | Status: DC
  Administered 2020-08-29 – 2020-09-04 (×6): 250 ug via ORAL
  Filled 2020-08-28 (×6): qty 3

## 2020-08-28 NOTE — ED Notes (Signed)
CRITICAL VALUE ALERT  Critical Value:  Lactic acid 2.3  Date & Time Notied:  08/28/2020 0704  Provider Notified: Dr. Betsey Holiday   Orders Received/Actions taken: see chart

## 2020-08-28 NOTE — ED Notes (Signed)
Dr Tat at bedside 

## 2020-08-28 NOTE — ED Provider Notes (Signed)
[facility] EMERGENCY DEPARTMENT Provider Note   CSN: [id number] Arrival date & time: 08/28/20  0543     History Chief Complaint  Patient presents with  . Altered Mental Status    Deborah Russo is a 59 y.o. female.  Patient presents to the emergency department with altered mental status and generalized weakness.  Patient reportedly stays with an individual who generally cares for her but that individual went away.  Patient was checked on today and was found lying covered in her own feces and vomit with flies swarming around her.  She reports that she has not been able to get up for 3 days.  She hasn't eaten or drank anything.  Patient somewhat slow to respond to questions, does not appear completely oriented.  Level 5 caveat due to mental status changes.        Past Medical History:  Diagnosis Date  . Chronic abdominal pain    since approximately 1991  . Chronic migraine   . Chronic pain   . Hypothyroidism   . Nausea, vomiting, and diarrhea    recurrent, chronic  . Pain management   . Sleep apnea    has CPAP, but doesn't wear it bc she says "i can't sleep with it on"  . Stroke [facility])     Patient Active Problem List   Diagnosis Date Noted  . Hemiparesis of right dominant side as late effect of cerebral infarction ([place]) 02/06/2019  . Vitamin D deficiency 02/06/2019  . Mixed hyperlipidemia 02/06/2019  . Gastroesophageal reflux disease without esophagitis 02/06/2019  . B12 deficiency 02/06/2019  . Thrombocytopenia ([place]) 02/06/2019  . Adrenal insufficiency ([place]) 02/06/2019  . Morbid obesity ([place]) 02/06/2019  . Prediabetes 09/21/2018  . Cirrhosis of liver without ascites ([place])   . Polypharmacy   . DNR (do not resuscitate) discussion   . Current smoker 03/29/2017  . HTN (hypertension) 03/29/2017  . Aortic atherosclerosis ([place]) 09/29/2016  . Chronic migraine 09/29/2016  . H/O Clostridium difficile infection 07/13/2014  . Diarrhea 07/13/2014  . FTT (failure to  thrive) in adult 07/13/2014  . Chronic abdominal pain   . Hypothyroidism 06/19/2014  . Hypokalemia 06/19/2014  . Chronic pain 06/19/2014  . Anemia 06/19/2014    Past Surgical History:  Procedure Laterality Date  . ABDOMINAL HYSTERECTOMY    . ABDOMINAL SURGERY    . BIOPSY N/A 07/17/2014   Procedure: BIOPSY;  Surgeon: Rogene Houston, MD;  Location: [facility];  Service: Endoscopy;  Laterality: N/A;  . BIOPSY  06/16/2018   Procedure: BIOPSY;  Surgeon: [name], MD;  Location: [place];  Service: Endoscopy;;  . BREAST SURGERY Left    removed nipple  . CESAREAN SECTION    . CHOLECYSTECTOMY    . ESOPHAGOGASTRODUODENOSCOPY N/A 07/17/2014   Procedure: ESOPHAGOGASTRODUODENOSCOPY (EGD);  Surgeon: Rogene Houston, MD;  Location: [facility];  Service: Endoscopy;  Laterality: N/A;  . FLEXIBLE SIGMOIDOSCOPY N/A 06/16/2018   Procedure: FLEXIBLE SIGMOIDOSCOPY;  Surgeon: [name], MD;  Location[facility] ENDOSCOPY;  Service: Endoscopy;  Laterality: N/A;     OB History    Gravida  2   Para  1   Term  1   Preterm      AB  1   Living        SAB      TAB  1   Ectopic      Multiple      Live Births  Family History  Problem Relation Age of Onset  . Asthma Mother   . Cancer Father     Social History   Tobacco Use  . Smoking status: Current Every Day Smoker    Packs/day: 1.00    Years: 41.00    Pack years: 41.00    Types: Cigarettes  . Smokeless tobacco: Never Used  Vaping Use  . Vaping Use: Never used  Substance Use Topics  . Alcohol use: No  . Drug use: Yes    Frequency: 2.0 times per week    Types: Marijuana, Oxycodone    Home Medications Prior to Admission medications   Medication Sig Start Date End Date Taking? Authorizing Provider  aspirin 325 MG tablet Take 1 tablet (325 mg total) by mouth daily. 02/06/19   Baruch Gouty, FNP  cholestyramine (QUESTRAN) 4 g packet Take 1 packet (4 g total) by mouth 2 (two) times daily. 02/06/19   Baruch Gouty, FNP  cloNIDine (CATAPRES) 0.2 MG tablet Take 1 tablet (0.2 mg total) by mouth 2 (two) times daily. Needs to be seen for further refills. 05/20/20 06/19/20  Claretta Fraise, MD  diazepam (VALIUM) 5 MG tablet Take 5 mg by mouth every 8 (eight) hours as needed for anxiety.    [provider]  diphenoxylate-atropine (LOMOTIL) 2.5-0.025 MG tablet Take 1 tablet by mouth 3 (three) times daily. 06/23/18   Raiford Noble Latif, DO  famotidine (PEPCID) 20 MG tablet Take 1 tablet (20 mg total) by mouth 2 (two) times daily. 02/06/19   Baruch Gouty, FNP  fludrocortisone (FLORINEF) 0.1 MG tablet Take 1 tablet (0.1 mg total) by mouth daily. 02/06/19   Baruch Gouty, FNP  folic acid (FOLVITE) 1 MG tablet Place 1 tablet (1 mg total) into feeding tube daily. 02/06/19   Baruch Gouty, FNP  levothyroxine (SYNTHROID) 150 MCG tablet Take 1 tablet (150 mcg total) by mouth daily. (Needs to be seen before next refill) 02/11/20   Rakes, Connye Burkitt, FNP  loperamide (IMODIUM) 2 MG capsule Take 2 capsules (4 mg total) by mouth 2 (two) times daily. 06/23/18   Raiford Noble Latif, DO  Multiple Vitamin (MULTIVITAMIN WITH MINERALS) TABS tablet Take 1 tablet by mouth daily. 02/06/19   Baruch Gouty, FNP  oxyCODONE-acetaminophen (PERCOCET/ROXICET) 5-325 MG tablet Take 1 tablet by mouth every 8 (eight) hours as needed for severe pain. 06/23/18   Raiford Noble Latif, DO  oxyCODONE-acetaminophen (PERCOCET/ROXICET) 5-325 MG tablet Take by mouth every 8 (eight) hours as needed for severe pain.    [provider]  potassium chloride SA (KLOR-CON M20) 20 MEQ tablet Take 1 tablet (20 mEq total) by mouth daily. 02/06/19   Baruch Gouty, FNP  pravastatin (PRAVACHOL) 20 MG tablet Take 1 tablet (20 mg total) by mouth daily. (Needs to be seen before next refill) 02/11/20   Rakes, Connye Burkitt, FNP  SUMAtriptan (IMITREX) 50 MG tablet TAKE 1 TABLET AT ONSET OF HEADACHE, MAY REPEAT ONCE IN 2 HOURS. MAX 4PER DAY 11/20/19   Baruch Gouty, FNP    tamsulosin (FLOMAX) 0.4 MG CAPS capsule Take 1 capsule (0.4 mg total) by mouth daily. 06/24/18   Raiford Noble Latif, DO  thiamine 100 MG tablet Take 1 tablet (100 mg total) by mouth daily. 02/06/19   Baruch Gouty, FNP  vitamin B-12 (CYANOCOBALAMIN) 250 MCG tablet Take 1 tablet (250 mcg total) by mouth daily. 02/06/19   Baruch Gouty, FNP    Allergies  Penicillins and Sulfa antibiotics  Review of Systems   Review of Systems  Unable to perform ROS: Mental status change    Physical Exam Updated Vital Signs BP (!) 79/48   Pulse (!) 34   Temp 99.4 F (37.4 C) (Rectal)   Resp 17   SpO2 98%   Physical Exam Vitals and nursing note reviewed.  Constitutional:      Appearance: She is well-developed. She is ill-appearing.  HENT:     Head: Normocephalic and atraumatic.     Right Ear: Hearing normal.     Left Ear: Hearing normal.     Nose: Nose normal.  Eyes:     Conjunctiva/sclera: Conjunctivae normal.     Pupils: Pupils are equal, round, and reactive to light.  Cardiovascular:     Rate and Rhythm: Regular rhythm.     Heart sounds: S1 normal and S2 normal. No murmur heard.  No friction rub. No gallop.   Pulmonary:     Effort: Pulmonary effort is normal. No respiratory distress.     Breath sounds: Normal breath sounds.  Chest:     Chest wall: No tenderness.  Abdominal:     General: Bowel sounds are normal.     Palpations: Abdomen is soft.     Tenderness: There is no abdominal tenderness. There is no guarding or rebound. Negative signs include Murphy's sign and McBurney's sign.     Hernia: No hernia is present.  Musculoskeletal:        General: Normal range of motion.     Cervical back: Normal range of motion and neck supple.  Skin:    General: Skin is warm and dry.     Findings: Bruising (Right upper arm) present. No rash.     Comments: Covered in feces, superficial excoriations noted on buttocks with cleaning  Neurological:     Mental Status: She is alert.     GCS: GCS  eye subscore is 4. GCS verbal subscore is 4. GCS motor subscore is 6.     Cranial Nerves: No cranial nerve deficit.     Sensory: No sensory deficit.     Coordination: Coordination normal.     Comments: No focal deficits but patient does appear to be having difficulty answering questions     ED Results / Procedures / Treatments   Labs (all labs ordered are listed, but only abnormal results are displayed) Labs Reviewed  CBC WITH DIFFERENTIAL/PLATELET - Abnormal; Notable for the following components:      Result Value   Hemoglobin 17.6 (*)    HCT 50.4 (*)    MCV 103.5 (*)    MCH 36.1 (*)    RDW 17.5 (*)    All other components within normal limits  COMPREHENSIVE METABOLIC PANEL - Abnormal; Notable for the following components:   Potassium <2.0 (*)    Chloride 87 (*)    Glucose, Bld 208 (*)    Creatinine, Ser 1.08 (*)    Total Protein 8.4 (*)    Total Bilirubin 2.3 (*)    GFR calc non Af Amer 55 (*)    Anion gap 19 (*)    All other components within normal limits  LACTIC ACID, PLASMA - Abnormal; Notable for the following components:   Lactic Acid, Venous 2.3 (*)    All other components within normal limits  URINALYSIS, ROUTINE W REFLEX MICROSCOPIC - Abnormal; Notable for the following components:   Color, Urine AMBER (*)    APPearance CLOUDY (*)  Hgb urine dipstick SMALL (*)    Ketones, ur 20 (*)    Protein, ur 30 (*)    Leukocytes,Ua TRACE (*)    Bacteria, UA MANY (*)    All other components within normal limits  CBG MONITORING, ED - Abnormal; Notable for the following components:   Glucose-Capillary 210 (*)    All other components within normal limits  TROPONIN I (HIGH SENSITIVITY) - Abnormal; Notable for the following components:   Troponin I (High Sensitivity) 34 (*)    All other components within normal limits  SARS CORONAVIRUS 2 BY RT PCR (HOSPITAL ORDER, [place] [facility])  C DIFFICILE QUICK SCREEN W PCR REFLEX  GASTROINTESTINAL PANEL BY  PCR, STOOL (REPLACES STOOL CULTURE)  CK  I-STAT CHEM 8, ED    EKG EKG Interpretation  Date/Time:  Thursday August 28 2020 06:11:55 EDT Ventricular Rate:  75 PR Interval:    QRS Duration: 103 QT Interval:  526 QTC Calculation: 588 R Axis:   67 Text Interpretation: Sinus rhythm Left atrial enlargement Left ventricular hypertrophy Repol abnrm, severe global ischemia (LM/MVD) Prolonged QT interval Confirmed by Orpah Greek [phone]) on 08/28/2020 6:45:32 AM   Radiology CT HEAD WO CONTRAST  Result Date: 08/28/2020 CLINICAL DATA:  Mental status change.  Not performing ADLs EXAM: CT HEAD WITHOUT CONTRAST TECHNIQUE: Contiguous axial images were obtained from the base of the skull through the vertex without intravenous contrast. COMPARISON:  06/11/2018 FINDINGS: Brain: Remote left parietal infarction centered at the cortex, moderate size. Dilated perivascular space below the left putamen. Chronic lacune at the right caudate head. Best seen on coronal reformats is a thin hyper to isodense collection along the left cerebral convexity, frontotemporal. No hydrocephalus, acute infarct, or masslike finding. Vascular: No hyperdense vessel or unexpected calcification. Skull: Normal. Negative for fracture or focal lesion. Sinuses/Orbits: No acute finding. Critical Value/emergent results were called by telephone at the time of interpretation on 08/28/2020 at 7:09 am to provider [facility] , who verbally acknowledged these results. IMPRESSION: 1. Thin subdural hematoma along the lateral left frontal convexity, only 4 mm in maximal thickness. 2. Remote infarcts at the left parietal cortex and right caudate head. Electronically Signed   By: Monte Fantasia M.D.   On: 08/28/2020 07:09   DG Chest Port 1 View  Result Date: 08/28/2020 CLINICAL DATA:  Weakness.  Smoker. EXAM: PORTABLE CHEST 1 VIEW COMPARISON:  06/11/2018 FINDINGS: Cardiac enlargement. Central peribronchial thickening with diffuse  interstitial pattern to the lungs. This could represent edema, pneumonia, or chronic fibrosis. No consolidation. No pleural effusions. No pneumothorax. Mediastinal contours appear intact. Calcification of the aorta. IMPRESSION: Cardiac enlargement. Central peribronchial thickening with diffuse interstitial pattern to the lungs. Electronically Signed   By: Lucienne Capers M.D.   On: 08/28/2020 06:27    Procedures Procedures (including critical care time)  Medications Ordered in ED Medications  potassium chloride 10 mEq in 100 mL IVPB (has no administration in time range)  lactated ringers bolus 1,000 mL (has no administration in time range)    ED Course  I have reviewed the triage vital signs and the nursing notes.  Pertinent labs & imaging results that were available during my care of the patient were reviewed by me and considered in my medical decision making (see chart for details).    MDM Rules/Calculators/A&P                          Patient brought  to the emergency department from her residence.  She is disheveled and unable to firmly answer questions.  Patient covered in diarrhea stool.  She reportedly has someone that cares for her at her home but that individual went away for the weekend and she has been unable to care for herself.  Patient has various areas of bruising but cannot tell me how she got them.  No obvious deformities.   Patient underwent CT because of her confusion.  She does have a tiny subdural without any mass-effect.  Does not explain her mental status changes.  With the multiple bruises she has, likely has had a fall at some point and now is so weak that she cannot get up.  Lab work is surprisingly normal except for profound hypokalemia.  Will initiate IV potassium replacement.  Patient will require hospitalization for further management.  Clinical Impression(s) / ED Diagnoses Final diagnoses:  Altered mental status, unspecified altered mental status type    Hypokalemia  SDH (subdural hematoma) ([place])    Rx / DC Orders ED Discharge Orders    None       Darell Saputo, Gwenyth Allegra, MD 08/28/20 [phone]

## 2020-08-28 NOTE — ED Notes (Signed)
EKG done and seen by Dr Betsey Holiday

## 2020-08-28 NOTE — ED Notes (Signed)
Patient denies pain and is resting comfortably.  

## 2020-08-28 NOTE — H&P (Addendum)
History and Physical  Deborah Russo NFA:[id number] DOB: June 03, 1961 DOA: 08/28/2020   PCP: Baruch Gouty, FNP   Patient coming from: Home  Chief Complaint: altered mental status  HPI:  Deborah Russo is a 59 y.o. female with medical history of 59 year old female with a history of stroke, chronic nausea and vomiting and abdominal pain, hypothyroidism, low B12, thrombocytopenia, hyperlipidemia, hypertension, GERD, prediabetes presenting with altered mental status.  Unfortunately, the patient is unable to provide any significant history secondary to her altered mental status.  Attempts were made to contact the patient's family without any answer.  Apparently, the patient lives with caregivers that assist with her activities of daily living.  Apparently, one of the caregivers was gone for the weekend, and the other caregiver came by to check up on the patient and found the patient lying in her urine and feces.  She was confused.  EMS was activated.  The patient was awake and alert, but slow to respond and confused during my examination.  Review of systems was extremely limited.  She did deny any headache, visual disturbance, chest pain, shortness breath, abdominal pain. In the emergency department, the patient had low-grade temperature 9 9.4 F.  She was initially hypotensive with systolic blood pressure in the 70s.  She was fluid resuscitated with improvement of her blood pressure into the low 90s.  BMP showed a potassium less than 2.  Serum creatinine was 1.08.  LFTs revealed AST 32, ALT 23, alk phosphatase 107, total bilirubin 2.3, WBC 8.4, globin 17.6, platelets 157,000.  The patient was given lactated Ringer's x1 L.  CT of the brain showed a very small subdural hematoma on the left lateral convexity.  Chest x-ray showed mild increased interstitial markings without consolidation.  Urinalysis was negative for pyuria.  Lactic acid 2.3, CK 177, troponin 34.  EKG shows sinus rhythm with  nonspecific ST-T wave changes.  Assessment/Plan: Acute metabolic encephalopathy -Multifactorial including dehydration, electrolyte derangements, subdural hematoma, and possible infectious source. -Check TSH -Serum I69 -Folic acid -Urinalysis negative for pyuria -Chest x-ray negative for consolidation -CT brain--tiny subdural hematoma -Urine drug screen -she remains confused, lethargic and slow to respond -she is unable to care for herself and represents an unsafe discharge  Subdural hematoma -I personally spoke with neurosurgery, Dr. Merlyn Albert conservative/nonoperative management -Hold aspirin for a week  Dehydration -The patient is hemoconcentrated -Continue normal saline -A.m. BMP  Lactic acidosis/Hypotension -Initial lactic acid 2.3 -concerned about underlying infection with high risk of decompensation -continue aggressive fluid resuscitation-->change to 0.9NS at 125 cc/hr -Obtain blood cultures -Start empiric antibiotics pending culture data -Check procalcitonin  Hypokalemia -potassium remains <2 despite initial IV replacement -Replete IV and p.o. -Check magnesium  Liver cirrhosis without ascites -noted on CT abd/pelvis in past -check ammonia  Thrombocytopenia -This is chronic upon review of the medical records, likely due to liver cirrhosis -A.m. CBC  Chronic pain -Holding Percocet temporarily  Hypothyroidism -Restart levothyroxine--plan to give IV temporarily until more alert  Hyperglycemia with history of prediabetes -Hemoglobin A1c  Hyperlipidemia -Holding statin until patient is more alert  Diarrhea -Stool pathogen panel -Check C. Difficile  Essential hypertension -Holding clonidine secondary to soft blood pressure  Elevated troponin -Likely secondary to demand ischemia -EKG without concerning ischemic changes -Echocardiogram      Severity of Illness: The appropriate patient status for this patient is INPATIENT. Inpatient  status is judged to be reasonable and necessary in order to provide the required intensity of service  to ensure the patient's safety. The patient's presenting symptoms, physical exam findings, and initial radiographic and laboratory data in the context of their chronic comorbidities is felt to place them at high risk for further clinical deterioration. Furthermore, it is not anticipated that the patient will be medically stable for discharge from the hospital within 2 midnights of admission. The following factors support the patient status of inpatient.  -hypotension and lactic acidosis requiring aggressive fluid resuscitation -metabolic encephalopathy without improvement -severe electrolyte derangement (hypokalemia) requiring IV replacement -severe hypothyroidism with TSH >20 -unsafe discharge due to patients current mental status and home situation  * I certify that at the point of admission it is my clinical judgment that the patient will require inpatient hospital care spanning beyond 2 midnights from the point of admission due to high intensity of service, high risk for further deterioration and high frequency of surveillance required.*    Past Medical History:  Diagnosis Date  . Chronic abdominal pain    since approximately 1991  . Chronic migraine   . Chronic pain   . Hypothyroidism   . Nausea, vomiting, and diarrhea    recurrent, chronic  . Pain management   . Sleep apnea    has CPAP, but doesn't wear it bc she says "i can't sleep with it on"  . Stroke [facility])    Past Surgical History:  Procedure Laterality Date  . ABDOMINAL HYSTERECTOMY    . ABDOMINAL SURGERY    . BIOPSY N/A 07/17/2014   Procedure: BIOPSY;  Surgeon: Rogene Houston, MD;  Location: [facility];  Service: Endoscopy;  Laterality: N/A;  . BIOPSY  06/16/2018   Procedure: BIOPSY;  Surgeon: [name], MD;  Location: [place];  Service: Endoscopy;;  . BREAST SURGERY Left    removed nipple  . CESAREAN SECTION      . CHOLECYSTECTOMY    . ESOPHAGOGASTRODUODENOSCOPY N/A 07/17/2014   Procedure: ESOPHAGOGASTRODUODENOSCOPY (EGD);  Surgeon: Rogene Houston, MD;  Location: [facility];  Service: Endoscopy;  Laterality: N/A;  . FLEXIBLE SIGMOIDOSCOPY N/A 06/16/2018   Procedure: FLEXIBLE SIGMOIDOSCOPY;  Surgeon: [name], MD;  Location[facility] ENDOSCOPY;  Service: Endoscopy;  Laterality: N/A;   Social History:  reports that she has been smoking cigarettes. She has a 41.00 pack-year smoking history. She has never used smokeless tobacco. She reports current drug use. Frequency: 2.00 times per week. Drugs: Marijuana and Oxycodone. She reports that she does not drink alcohol.   Family History  Problem Relation Age of Onset  . Asthma Mother   . Cancer Father      Allergies  Allergen Reactions  . Penicillins Hives    *Tolerates zosyn Has patient had a PCN reaction causing immediate rash, facial/tongue/throat swelling, SOB or lightheadedness with hypotension: No Has patient had a PCN reaction causing severe rash involving mucus membranes or skin necrosis: No Has patient had a PCN reaction that required hospitalization No Has patient had a PCN reaction occurring within the last 10 years: No If all of the above answers are "NO", then may proceed with Cephalosporin use.   . Sulfa Antibiotics Hives     Prior to Admission medications   Medication Sig Start Date End Date Taking? Authorizing Provider  aspirin 325 MG tablet Take 1 tablet (325 mg total) by mouth daily. 02/06/19   Baruch Gouty, FNP  cholestyramine (QUESTRAN) 4 g packet Take 1 packet (4 g total) by mouth 2 (two) times daily. 02/06/19   Baruch Gouty, FNP  cloNIDine (CATAPRES) 0.2 MG tablet Take 1 tablet (0.2 mg total) by mouth 2 (two) times daily. Needs to be seen for further refills. 05/20/20 06/19/20  Claretta Fraise, MD  diazepam (VALIUM) 5 MG tablet Take 5 mg by mouth every 8 (eight) hours as needed for anxiety.    [provider]   diphenoxylate-atropine (LOMOTIL) 2.5-0.025 MG tablet Take 1 tablet by mouth 3 (three) times daily. 06/23/18   Raiford Noble Latif, DO  famotidine (PEPCID) 20 MG tablet Take 1 tablet (20 mg total) by mouth 2 (two) times daily. 02/06/19   Baruch Gouty, FNP  fludrocortisone (FLORINEF) 0.1 MG tablet Take 1 tablet (0.1 mg total) by mouth daily. 02/06/19   Baruch Gouty, FNP  folic acid (FOLVITE) 1 MG tablet Place 1 tablet (1 mg total) into feeding tube daily. 02/06/19   Baruch Gouty, FNP  levothyroxine (SYNTHROID) 150 MCG tablet Take 1 tablet (150 mcg total) by mouth daily. (Needs to be seen before next refill) 02/11/20   Rakes, Connye Burkitt, FNP  loperamide (IMODIUM) 2 MG capsule Take 2 capsules (4 mg total) by mouth 2 (two) times daily. 06/23/18   Raiford Noble Latif, DO  Multiple Vitamin (MULTIVITAMIN WITH MINERALS) TABS tablet Take 1 tablet by mouth daily. 02/06/19   Baruch Gouty, FNP  oxyCODONE-acetaminophen (PERCOCET/ROXICET) 5-325 MG tablet Take 1 tablet by mouth every 8 (eight) hours as needed for severe pain. 06/23/18   Raiford Noble Latif, DO  oxyCODONE-acetaminophen (PERCOCET/ROXICET) 5-325 MG tablet Take by mouth every 8 (eight) hours as needed for severe pain.    [provider]  potassium chloride SA (KLOR-CON M20) 20 MEQ tablet Take 1 tablet (20 mEq total) by mouth daily. 02/06/19   Baruch Gouty, FNP  pravastatin (PRAVACHOL) 20 MG tablet Take 1 tablet (20 mg total) by mouth daily. (Needs to be seen before next refill) 02/11/20   Rakes, Connye Burkitt, FNP  SUMAtriptan (IMITREX) 50 MG tablet TAKE 1 TABLET AT ONSET OF HEADACHE, MAY REPEAT ONCE IN 2 HOURS. MAX 4PER DAY 11/20/19   Baruch Gouty, FNP  tamsulosin (FLOMAX) 0.4 MG CAPS capsule Take 1 capsule (0.4 mg total) by mouth daily. 06/24/18   Raiford Noble Latif, DO  thiamine 100 MG tablet Take 1 tablet (100 mg total) by mouth daily. 02/06/19   Baruch Gouty, FNP  vitamin B-12 (CYANOCOBALAMIN) 250 MCG tablet Take 1 tablet (250 mcg total) by mouth  daily. 02/06/19   Baruch Gouty, FNP    Review of Systems:  Unobtainable due to mental status  Physical Exam: Vitals:   08/28/20 0630 08/28/20 0700 08/28/20 0730 08/28/20 0800  BP: (!) 86/71 (!) _0  Pulse: 71 (!) 34 67 67  Resp: (!) _1 Temp:      TempSrc:      SpO2: 98% 98% 97% 98%   General:  A&O x 1, NAD, nontoxic, pleasant/cooperative Head/Eye: No conjunctival hemorrhage, no icterus, [place]/AT, No nystagmus ENT:  No icterus,  No thrush, good dentition, no pharyngeal exudate Neck:  No masses, no lymphadenpathy, no bruits CV:  RRR, no rub, no gallop, no S3 Lung:  CTAB, good air movement, no wheeze, no rhonchi Abdomen: soft/NT, +BS, nondistended, no peritoneal signs Ext: No cyanosis, No rashes, No petechiae, No lymphangitis, No edema   Labs on Admission:  Basic Metabolic Panel: Recent Labs  Lab 08/28/20 0630  NA 135  K <2.0*  CL 87*  CO2 29  GLUCOSE 208*  BUN  19  CREATININE 1.08*  CALCIUM 9.2   Liver Function Tests: Recent Labs  Lab 08/28/20 0630  AST 32  ALT 23  ALKPHOS 107  BILITOT 2.3*  PROT 8.4*  ALBUMIN 4.0   No results for input(s): LIPASE, AMYLASE in the last 168 hours. No results for input(s): AMMONIA in the last 168 hours. CBC: Recent Labs  Lab 08/28/20 0630  WBC 8.4  NEUTROABS 6.4  HGB 17.6*  HCT 50.4*  MCV 103.5*  PLT 157   Coagulation Profile: No results for input(s): INR, PROTIME in the last 168 hours. Cardiac Enzymes: Recent Labs  Lab 08/28/20 0630  CKTOTAL 177   BNP: Invalid input(s): POCBNP CBG: Recent Labs  Lab 08/28/20 0639  GLUCAP 210*   Urine analysis:    Component Value Date/Time   COLORURINE AMBER (A) 08/28/2020 0604   APPEARANCEUR CLOUDY (A) 08/28/2020 0604   LABSPEC 1.012 08/28/2020 0604   PHURINE 6.0 08/28/2020 0604   GLUCOSEU NEGATIVE 08/28/2020 0604   HGBUR SMALL (A) 08/28/2020 0604   BILIRUBINUR NEGATIVE 08/28/2020 0604   KETONESUR 20 (A) 08/28/2020 0604   PROTEINUR 30 (A)  08/28/2020 0604   UROBILINOGEN 0.2 09/02/2015 1735   NITRITE NEGATIVE 08/28/2020 0604   LEUKOCYTESUR TRACE (A) 08/28/2020 0604   Sepsis Labs: _0 (procalcitonin:4,lacticidven:4) )No results found for this or any previous visit (from the past 240 hour(s)).   Radiological Exams on Admission: CT HEAD WO CONTRAST  Result Date: 08/28/2020 CLINICAL DATA:  Mental status change.  Not performing ADLs EXAM: CT HEAD WITHOUT CONTRAST TECHNIQUE: Contiguous axial images were obtained from the base of the skull through the vertex without intravenous contrast. COMPARISON:  06/11/2018 FINDINGS: Brain: Remote left parietal infarction centered at the cortex, moderate size. Dilated perivascular space below the left putamen. Chronic lacune at the right caudate head. Best seen on coronal reformats is a thin hyper to isodense collection along the left cerebral convexity, frontotemporal. No hydrocephalus, acute infarct, or masslike finding. Vascular: No hyperdense vessel or unexpected calcification. Skull: Normal. Negative for fracture or focal lesion. Sinuses/Orbits: No acute finding. Critical Value/emergent results were called by telephone at the time of interpretation on 08/28/2020 at 7:09 am to provider [facility] , who verbally acknowledged these results. IMPRESSION: 1. Thin subdural hematoma along the lateral left frontal convexity, only 4 mm in maximal thickness. 2. Remote infarcts at the left parietal cortex and right caudate head. Electronically Signed   By: Monte Fantasia M.D.   On: 08/28/2020 07:09   DG Chest Port 1 View  Result Date: 08/28/2020 CLINICAL DATA:  Weakness.  Smoker. EXAM: PORTABLE CHEST 1 VIEW COMPARISON:  06/11/2018 FINDINGS: Cardiac enlargement. Central peribronchial thickening with diffuse interstitial pattern to the lungs. This could represent edema, pneumonia, or chronic fibrosis. No consolidation. No pleural effusions. No pneumothorax. Mediastinal contours appear intact.  Calcification of the aorta. IMPRESSION: Cardiac enlargement. Central peribronchial thickening with diffuse interstitial pattern to the lungs. Electronically Signed   By: Lucienne Capers M.D.   On: 08/28/2020 06:27    EKG: Independently reviewed. Sinus, nonspecific STT changes    Time spent:70 minutes Code Status:   FULL Family Communication:  No Family at bedside Disposition Plan: expect 2-3 day hospitalization Consults called: neurosurgery on phone ([place]) DVT Prophylaxis: SCDs  Orson Eva, DO  [facility] Pager [phone]  If 7PM-7AM, please contact night-coverage [email or web address] Password [facility] 08/28/2020, 8:28 AM

## 2020-08-28 NOTE — ED Notes (Signed)
CRITICAL VALUE ALERT  Critical Value:  Potassium less than 2   Date & Time Notied:  08/28/20 & 0718hrs  Provider Notified: Dr. Betsey Holiday  Orders Received/Actions taken: notified

## 2020-08-28 NOTE — ED Triage Notes (Signed)
Pt from home via [facility]. EMS reports pt has not moved from her bed in 3 days. Pt covered in feces and urine. Pt unable to tell us why she is here.

## 2020-08-28 NOTE — ED Provider Notes (Signed)
Patient CARE signed out to continue to monitor until admission. Patient had no acute changes or new medical concerns.  Golda Acre, MD 09/01/20 [phone]

## 2020-08-29 ENCOUNTER — Inpatient Hospital Stay (HOSPITAL_COMMUNITY): Payer: Medicare HMO

## 2020-08-29 DIAGNOSIS — R9431 Abnormal electrocardiogram [ECG] [EKG]: Secondary | ICD-10-CM

## 2020-08-29 DIAGNOSIS — I10 Essential (primary) hypertension: Secondary | ICD-10-CM

## 2020-08-29 LAB — GASTROINTESTINAL PANEL BY PCR, STOOL (REPLACES STOOL CULTURE)

## 2020-08-29 LAB — COMPREHENSIVE METABOLIC PANEL
ALT: 17 U/L (ref 0–44)
AST: 18 U/L (ref 15–41)
Albumin: 3.1 g/dL — ABNORMAL LOW (ref 3.5–5.0)
Alkaline Phosphatase: 73 U/L (ref 38–126)
Anion gap: 11 (ref 5–15)
BUN: 16 mg/dL (ref 8–23)
CO2: 25 mmol/L (ref 22–32)
Calcium: 7.9 mg/dL — ABNORMAL LOW (ref 8.9–10.3)
Chloride: 103 mmol/L (ref 98–111)
Creatinine, Ser: 0.73 mg/dL (ref 0.44–1.00)
GFR calc Af Amer: >60 mL/min (ref >60)
GFR calc non Af Amer: >60 mL/min (ref >60)
Glucose, Bld: 218 mg/dL — ABNORMAL HIGH (ref 70–99)
Potassium: 2.1 mmol/L — CL (ref 3.5–5.1)
Sodium: 139 mmol/L (ref 135–145)
Total Bilirubin: 1.8 mg/dL — ABNORMAL HIGH (ref 0.3–1.2)
Total Protein: 6.2 g/dL — ABNORMAL LOW (ref 6.5–8.1)

## 2020-08-29 LAB — GLUCOSE, CAPILLARY
Glucose-Capillary: 215 mg/dL — ABNORMAL HIGH (ref 70–99)
Glucose-Capillary: 209 mg/dL — ABNORMAL HIGH (ref 70–99)
Glucose-Capillary: 181 mg/dL — ABNORMAL HIGH (ref 70–99)
Glucose-Capillary: 151 mg/dL — ABNORMAL HIGH (ref 70–99)

## 2020-08-29 LAB — CBC
HCT: 41.6 % (ref 36.0–46.0)
Hemoglobin: 14.1 g/dL (ref 12.0–15.0)
MCH: 35.4 pg — ABNORMAL HIGH (ref 26.0–34.0)
MCHC: 33.9 g/dL (ref 30.0–36.0)
MCV: 104.5 fL — ABNORMAL HIGH (ref 80.0–100.0)
Platelets: 123 10*3/uL — ABNORMAL LOW (ref 150–400)
RBC: 3.98 MIL/uL (ref 3.87–5.11)
RDW: 17.9 % — ABNORMAL HIGH (ref 11.5–15.5)
WBC: 7.8 10*3/uL (ref 4.0–10.5)
nRBC: 0 % (ref 0.0–0.2)

## 2020-08-29 LAB — ECHOCARDIOGRAM COMPLETE
Area-P 1/2: 2.08 cm2
Height: 65 in
S' Lateral: 2.34 cm
Weight: 2832.47 oz

## 2020-08-29 LAB — HIV ANTIBODY (ROUTINE TESTING W REFLEX): HIV Screen 4th Generation wRfx: NONREACTIVE

## 2020-08-29 LAB — MAGNESIUM: Magnesium: 2 mg/dL (ref 1.7–2.4)

## 2020-08-29 LAB — AMMONIA: Ammonia: 35 umol/L (ref 9–35)

## 2020-08-29 MED ORDER — PANTOPRAZOLE SODIUM 40 MG IV SOLR
40.0000 mg | Freq: Two times a day (BID) | INTRAVENOUS | Status: DC
  Administered 2020-08-29 – 2020-09-02 (×10): 40 mg via INTRAVENOUS
  Filled 2020-08-29 (×10): qty 40

## 2020-08-29 MED ORDER — TRAMADOL HCL 50 MG PO TABS
50.0000 mg | ORAL_TABLET | Freq: Four times a day (QID) | ORAL | Status: DC | PRN
  Administered 2020-08-30 – 2020-08-31 (×4): 50 mg via ORAL
  Filled 2020-08-29 (×4): qty 1

## 2020-08-29 MED ORDER — POTASSIUM CHLORIDE 10 MEQ/100ML IV SOLN
10.0000 meq | INTRAVENOUS | Status: AC
  Administered 2020-08-29 (×6): 10 meq via INTRAVENOUS
  Filled 2020-08-29 (×3): qty 100

## 2020-08-29 MED ORDER — POTASSIUM CHLORIDE CRYS ER 20 MEQ PO TBCR
40.0000 meq | EXTENDED_RELEASE_TABLET | Freq: Every day | ORAL | Status: DC
  Administered 2020-08-29 – 2020-09-02 (×5): 40 meq via ORAL
  Filled 2020-08-29: qty 2
  Filled 2020-08-29: qty 4
  Filled 2020-08-29 (×2): qty 2

## 2020-08-29 MED ORDER — IOHEXOL 300 MG/ML  SOLN
100.0000 mL | Freq: Once | INTRAMUSCULAR | Status: AC | PRN
  Administered 2020-08-29: 100 mL via INTRAVENOUS

## 2020-08-29 MED ORDER — IOHEXOL 9 MG/ML PO SOLN
ORAL | Status: AC
  Administered 2020-08-29: 500 mL
  Filled 2020-08-29: qty 1000

## 2020-08-29 NOTE — Progress Notes (Signed)
*  PRELIMINARY RESULTS* Echocardiogram 2D Echocardiogram has been performed.  Deborah Russo 08/29/2020, 10:05 AM

## 2020-08-29 NOTE — Plan of Care (Signed)
  Problem: Acute Rehab PT Goals(only PT should resolve) Goal: Pt Will Go Supine/Side To Sit Outcome: Progressing Flowsheets (Taken 08/29/2020 1423) Pt will go Supine/Side to Sit: with moderate assist Goal: Pt Will Go Sit To Supine/Side Outcome: Progressing Flowsheets (Taken 08/29/2020 1423) Pt will go Sit to Supine/Side: with moderate assist Goal: Patient Will Perform Sitting Balance Outcome: Progressing Flowsheets (Taken 08/29/2020 1423) Patient will perform sitting balance: with moderate assist Goal: Patient Will Transfer Sit To/From Stand Outcome: Progressing Flowsheets (Taken 08/29/2020 1423) Patient will transfer sit to/from stand:  with moderate assist  with maximum assist  2:23 PM, 08/29/20 Mearl Latin PT, DPT Physical Therapist at [facility]

## 2020-08-29 NOTE — Progress Notes (Signed)
PT Cancellation Note  Patient Details Name: Deborah Russo MRN: [id number] DOB: 1961-09-13   Cancelled Treatment:    Reason Eval/Treat Not Completed: Patient at procedure or test/unavailable;Fatigue/lethargy limiting ability to participate;Medical issues which prohibited therapy; RN stated patient was lethargic this morning and was having a test done and to check back later. Will check back later if time permits.    10:32 AM, 08/29/20 Mearl Latin PT, DPT Physical Therapist at [facility]

## 2020-08-29 NOTE — NC FL2 (Signed)
[place] LEVEL OF CARE SCREENING TOOL     IDENTIFICATION  Patient Name: Deborah Russo Birthdate: 03-17-1961 Sex: female Admission Date (Current Location): 08/28/2020  [facility] and [place] Number:  [employer] and Address:  [place] [address], [place]      Provider Number: [id number]  Attending Physician Name and Address:  Orson Eva, MD  Relative Name and Phone Number:       Current Level of Care: Hospital Recommended Level of Care: [place] Prior Approval Number:    Date Approved/Denied:   PASRR Number: [id number]  Discharge Plan: SNF    Current Diagnoses: Patient Active Problem List   Diagnosis Date Noted  . Acute metabolic encephalopathy 38/09/1750  . SDH (subdural hematoma) ([place])   . Hemiparesis of right dominant side as late effect of cerebral infarction ([place]) 02/06/2019  . Vitamin D deficiency 02/06/2019  . Mixed hyperlipidemia 02/06/2019  . Gastroesophageal reflux disease without esophagitis 02/06/2019  . B12 deficiency 02/06/2019  . Thrombocytopenia ([place]) 02/06/2019  . Adrenal insufficiency ([place]) 02/06/2019  . Morbid obesity ([place]) 02/06/2019  . Prediabetes 09/21/2018  . Cirrhosis of liver without ascites ([place])   . Polypharmacy   . DNR (do not resuscitate) discussion   . Current smoker 03/29/2017  . HTN (hypertension) 03/29/2017  . Aortic atherosclerosis ([place]) 09/29/2016  . Chronic migraine 09/29/2016  . H/O Clostridium difficile infection 07/13/2014  . Diarrhea 07/13/2014  . FTT (failure to thrive) in adult 07/13/2014  . Chronic abdominal pain   . Hypothyroidism 06/19/2014  . Hypokalemia 06/19/2014  . Chronic pain 06/19/2014  . Anemia 06/19/2014    Orientation RESPIRATION BLADDER Height & Weight     Self  Normal Incontinent Weight: 177 lb 0.5 oz (80.3 kg) Height:  5' 5"  (165.1 cm)  BEHAVIORAL SYMPTOMS/MOOD NEUROLOGICAL BOWEL NUTRITION STATUS      Incontinent Diet  (Carb modified. See d/c summary for updates.)  AMBULATORY STATUS COMMUNICATION OF NEEDS Skin   Total Care Verbally Skin abrasions, Bruising                       Personal Care Assistance Level of Assistance  Bathing, Dressing, Feeding Bathing Assistance: Maximum assistance Feeding assistance: Limited assistance Dressing Assistance: Maximum assistance     Functional Limitations Info  Sight, Hearing, Speech Sight Info: Impaired Hearing Info: Adequate Speech Info: Adequate    SPECIAL CARE FACTORS FREQUENCY  PT (By licensed PT)     PT Frequency: daily              Contractures Contractures Info: Not present    Additional Factors Info  Code Status, Allergies, Psychotropic, Isolation Precautions Code Status Info: Full code Allergies Info: Penicillins, Sulfa Antibiotics Psychotropic Info: Valium   Isolation Precautions Info: Enteric     Current Medications (08/29/2020):  This is the current hospital active medication list Current Facility-Administered Medications  Medication Dose Route Frequency Provider Last Rate Last Admin  . 0.9 % NaCl with KCl 40 mEq / L  infusion   Intravenous Continuous Tat, Shanon Brow, MD 125 mL/hr at 08/29/20 1227 New Bag at 08/29/20 1227  . acetaminophen (TYLENOL) tablet 650 mg  650 mg Oral Q6H PRN Tat, Shanon Brow, MD   650 mg at 08/29/20 1023   Or  . acetaminophen (TYLENOL) suppository 650 mg  650 mg Rectal Q6H PRN Tat, David, MD      . folic acid injection 1 mg  1 mg Intravenous Daily  Orson Eva, MD   1 mg at 08/29/20 1016  . levothyroxine (SYNTHROID, LEVOTHROID) injection 75 mcg  75 mcg Intravenous Daily Tat, Shanon Brow, MD   75 mcg at 08/29/20 1015  . MEDLINE mouth rinse  15 mL Mouth Rinse BID Tat, David, MD   15 mL at 08/29/20 1017  . ondansetron (ZOFRAN) tablet 4 mg  4 mg Oral Q6H PRN Tat, David, MD   4 mg at 08/29/20 1023   Or  . ondansetron (ZOFRAN) injection 4 mg  4 mg Intravenous Q6H PRN Orson Eva, MD   4 mg at 08/29/20 9449  . pantoprazole  (PROTONIX) injection 40 mg  40 mg Intravenous Therisa Doyne, MD   40 mg at 08/29/20 1406  . potassium chloride SA (KLOR-CON) CR tablet 40 mEq  40 mEq Oral Daily Tat, David, MD   40 mEq at 08/29/20 1017  . thiamine tablet 100 mg  100 mg Oral Daily Tat, David, MD   100 mg at 08/29/20 1015  . traMADol (ULTRAM) tablet 50 mg  50 mg Oral Q6H PRN Tat, David, MD      . vitamin B-12 (CYANOCOBALAMIN) tablet 250 mcg  250 mcg Oral Daily Tat, David, MD   250 mcg at 08/29/20 1016     Discharge Medications: Please see discharge summary for a list of discharge medications.  Relevant Imaging Results:  Relevant Lab Results:   Additional Information SSN: [id number]. No COVID vaccines on file.  Salome Arnt, LCSW

## 2020-08-29 NOTE — Progress Notes (Signed)
Nurse called due to patient having a a dark brown emesis (uncertain if it was bloody).  Unfortunately, the emesis was to the side of the bed and it was not possible to obtain sample for gastric occult.  Gastric occult ordered and nurse advised to let patient have emesis bag close to her in case she vomits again, so that GI bleed could be ruled out with Hemoccult.

## 2020-08-29 NOTE — Evaluation (Signed)
Physical Therapy Evaluation Patient Details Name: Deborah Russo MRN: [id number] DOB: 1961-04-17 Today's Date: 08/29/2020   History of Present Illness  Deborah Russo is a 59 y.o. female with medical history of 59 year old female with a history of stroke, chronic nausea and vomiting and abdominal pain, hypothyroidism, low B12, thrombocytopenia, hyperlipidemia, hypertension, GERD, prediabetes presenting with altered mental status.  Unfortunately, the patient is unable to provide any significant history secondary to her altered mental status.  Attempts were made to contact the patient's family without any answer.  Apparently, the patient lives with caregivers that assist with her activities of daily living.  Apparently, one of the caregivers was gone for the weekend, and the other caregiver came by to check up on the patient and found the patient lying in her urine and feces.  She was confused.  EMS was activated.  The patient was awake and alert, but slow to respond and confused during my examination.  Review of systems was extremely limited.  She did deny any headache, visual disturbance, chest pain, shortness breath, abdominal pain.    Clinical Impression  Patient limited for functional mobility as stated below secondary to pain, BLE weakness, fatigue and poor activity tolerance. Patient lethargic at beginning of session which improves as session continues. Patient requires max assist to roll to side while RN assists in cleaning patient. Patient requires assist to remain in sidelying position. Patient attempt to pull to long sitting with max assist and max assist to remain in sitting position. Patient limited by fatigue, pain, and lethargy today. Patient will benefit from continued physical therapy in hospital and recommended venue below to increase strength, balance, endurance for safe ADLs and gait.     Follow Up Recommendations SNF    Equipment Recommendations  None recommended by PT     Recommendations for Other Services       Precautions / Restrictions Precautions Precautions: Fall Restrictions Weight Bearing Restrictions: No      Mobility  Bed Mobility Overal bed mobility: Needs Assistance Bed Mobility: Rolling;Supine to Sit Rolling: Max assist   Supine to sit: Max assist;HOB elevated     General bed mobility comments: max assist to roll right and left and assist to remain in sidelying position, patient max assist to pull to long sitting and remain sitting, limited due to pain and fatigue  Transfers                    Ambulation/Gait                Stairs            Wheelchair Mobility    Modified Rankin (Stroke Patients Only)       Balance                                             Pertinent Vitals/Pain Pain Assessment: Faces Faces Pain Scale: Hurts whole lot Pain Location: stomach Pain Intervention(s): Limited activity within patient's tolerance;Monitored during session;Repositioned    Home Living Family/patient expects to be discharged to:: Unsure                 Additional Comments: Patient unable to provide details of living situation due to being in pain and lethargic    Prior Function           Comments: Patient unable to provide  details of PLOF due to being in pain and lethargic     Hand Dominance        Extremity/Trunk Assessment   Upper Extremity Assessment Upper Extremity Assessment: Generalized weakness    Lower Extremity Assessment Lower Extremity Assessment: Generalized weakness       Communication      Cognition Arousal/Alertness: Lethargic Behavior During Therapy: WFL for tasks assessed/performed Overall Cognitive Status: No family/caregiver present to determine baseline cognitive functioning                                        General Comments      Exercises     Assessment/Plan    PT Assessment Patient needs continued PT  services  PT Problem List Decreased strength;Decreased mobility;Decreased activity tolerance;Decreased balance;Decreased knowledge of use of DME;Pain       PT Treatment Interventions DME instruction;Therapeutic exercise;Gait training;Balance training;Stair training;Neuromuscular re-education;Functional mobility training;Therapeutic activities;Patient/family education    PT Goals (Current goals can be found in the Care Plan section)  Acute Rehab PT Goals Patient Stated Goal: decrease stomach pain PT Goal Formulation: With patient Time For Goal Achievement: 09/12/20    Frequency Min 3X/week   Barriers to discharge        Co-evaluation               AM-PAC PT "6 Clicks" Mobility  Outcome Measure Help needed turning from your back to your side while in a flat bed without using bedrails?: A Lot Help needed moving from lying on your back to sitting on the side of a flat bed without using bedrails?: A Lot Help needed moving to and from a bed to a chair (including a wheelchair)?: Total Help needed standing up from a chair using your arms (e.g., wheelchair or bedside chair)?: Total Help needed to walk in hospital room?: Total Help needed climbing 3-5 steps with a railing? : Total 6 Click Score: 8    End of Session   Activity Tolerance: Patient tolerated treatment well Patient left: in bed;with nursing/sitter in room;with call bell/phone within reach Nurse Communication: Mobility status PT Visit Diagnosis: Unsteadiness on feet (R26.81);Other abnormalities of gait and mobility (R26.89);Muscle weakness (generalized) (M62.81)    Time: 5520-8022 PT Time Calculation (min) (ACUTE ONLY): 15 min   Charges:   PT Evaluation $PT Eval Moderate Complexity: 1 Mod         2:22 PM, 08/29/20 Mearl Latin PT, DPT Physical Therapist at [facility]

## 2020-08-29 NOTE — Progress Notes (Signed)
CRITICAL VALUE ALERT  Critical Value:  Potassium 2.1  Date & Time Notied:  08/29/2020 @ 0810  Provider Notified: Tat via [place] page  Orders Received/Actions taken:  Infuse 6 units of IV potassium

## 2020-08-29 NOTE — Progress Notes (Signed)
Pt vomited x1 moderate amount of dark brown emesis. Prn IV Zofran 4 mg given. MD notified.

## 2020-08-29 NOTE — TOC Progression Note (Addendum)
Transition of Care [facility]) - Progression Note    Patient Details  Name: Deborah Russo MRN: [id number] Date of Birth: 1961/02/07  Transition of Care [facility]) CM/SW Contact  Salome Arnt, [place] Phone Number: 08/29/2020, 1:30 PM  Clinical Narrative:  TOC received consult for home health/SNF. Per chart, pt oriented to self only. LCSW attempted to reach pt's daughter on cell phone and pt's home number. Daughter's cell phone is not in service and pt's voicemail is full. Awaiting PT evaluation. TOC will continue to follow for d/c planning needs.   Update: PT recommending SNF. LCSW confirmed with pt's RN no other contact information available. No family in pt's room. LCSW messaged [facility] social worker who is listed as part of care team to see if he has any contact information for family. Weekend Education officer, museum to follow up. FL2 completed in anticipation of SNF.         Expected Discharge Plan and Services                                                 Social Determinants of Health (SDOH) Interventions    Readmission Risk Interventions No flowsheet data found.

## 2020-08-29 NOTE — Progress Notes (Signed)
PROGRESS NOTE  CADYNCE GARRETTE MLY:[id number] DOB: [birth year] DOA: 08/28/2020 PCP: Baruch Gouty, FNP  Brief History:   59 y.o. female with medical history of 59 year old female with a history of stroke, chronic nausea and vomiting and abdominal pain, hypothyroidism, low B12, thrombocytopenia, hyperlipidemia, hypertension, GERD, prediabetes presenting with altered mental status.  Unfortunately, the patient is unable to provide any significant history secondary to her altered mental status.  Attempts were made to contact the patient's family without any answer.  Apparently, the patient lives with caregivers that assist with her activities of daily living.  Apparently, one of the caregivers was gone for the weekend, and the other caregiver came by to check up on the patient and found the patient lying in her urine and feces.  She was confused.  EMS was activated.  The patient was awake and alert, but slow to respond and confused during my examination.  Review of systems was extremely limited.  She did deny any headache, visual disturbance, chest pain, shortness breath, abdominal pain. In the emergency department, the patient had low-grade temperature 9 9.4 F.  She was initially hypotensive with systolic blood pressure in the 70s.  She was fluid resuscitated with improvement of her blood pressure into the low 90s.  BMP showed a potassium less than 2.  Serum creatinine was 1.08.  LFTs revealed AST 32, ALT 23, alk phosphatase 107, total bilirubin 2.3, WBC 8.4, globin 17.6, platelets 157,000.  The patient was given lactated Ringer's x1 L.  CT of the brain showed a very small subdural hematoma on the left lateral convexity.  Chest x-ray showed mild increased interstitial markings without consolidation.  Urinalysis was negative for pyuria.  Lactic acid 2.3, CK 177, troponin 34.  EKG shows sinus rhythm with nonspecific ST-T wave changes  Assessment/Plan: Acute metabolic encephalopathy -Multifactorial  including dehydration, electrolyte derangements, subdural hematoma, and possible infectious source. -Check TSH--20.018 -Serum C12--751 -Folic ZGYF--7.4-->BSWHQPR -Urinalysis negative for pyuria -Chest x-ray negative for consolidation -CT brain--tiny subdural hematoma -Urine drug screen--positive benzo -9/10--more awake but she remains confused, lethargic and slow to respond -she is unable to care for herself and represents an unsafe discharge  Subdural hematoma -I personally spoke with neurosurgery, Dr. Merlyn Albert conservative/nonoperative management -Hold aspirin for a week  Dehydration -The patient is hemoconcentrated -Continue normal saline -A.m. BMP  Lactic acidosis/Hypotension -Initial lactic acid 2.3 -concerned about underlying infection with high risk of decompensation -continue aggressive fluid resuscitation-->changed to 0.9NS with 40 KCL at 125 cc/hr -Obtain blood cultures--neg to date -Start empiric antibiotics pending culture data -Check procalcitonin <0.10  Hypokalemia -potassium remains 2.1 despite aggressive placement -Replete IV and p.o. -Check magnesium 2.0  Liver cirrhosis without ascites -noted on CT abd/pelvis in past -check ammonia-35  Thrombocytopenia -This is chronic upon review of the medical records, likely due to liver cirrhosis -A.m. CBC  Chronic pain -restart percocet at lower dose -PMP aware reviewed--pt gets monthly percocet 10/325, Oxycodone ER, valium 5 mg -CT abd  Hypothyroidism -Restart levothyroxine--plan to give IV temporarily until more alert  Hyperglycemia with history of prediabetes -Hemoglobin A1c-5.6  Hyperlipidemia -Holding statin until patient is more alert  Diarrhea -Stool pathogen panel--neg -Check C. Difficile--pending -CT abd/pelv  Essential hypertension -Holding clonidine secondary to soft blood pressure  Elevated troponin -Likely secondary to demand ischemia -EKG without concerning  ischemic changes -Echocardiogram--EF 70-75%, mod LVH, no WMA      Status is: Inpatient  Remains inpatient appropriate because:Unsafe d/c plan  Dispo: The patient is from: Home              Anticipated d/c is to: SNF              Anticipated d/c date is: 2 days              Patient currently is not medically stable to d/c.        Family Communication:   No answer upon calling  Consultants:  none  Code Status:  FULL   DVT Prophylaxis:  SCds   Procedures: As Listed in Progress Note Above  Antibiotics: None       Subjective: Patient denies fevers, chills, headache, chest pain, dyspnea, diarrhea, abdominal pain, dysuria, hematuria Objective: Vitals:   08/28/20 1649 08/28/20 2136 08/29/20 0230 08/29/20 0553  BP: 126/80 115/75 101/64 113/65  Pulse: 69 74 72 71  Resp: _0 Temp: 97.8 F (36.6 C) 98.8 F (37.1 C) 97.9 F (36.6 C) 98.5 F (36.9 C)  TempSrc: Oral Oral Oral Oral  SpO2: 100% 100% 96% 100%  Weight: 80.3 kg     Height: _1  (1.651 m)       Intake/Output Summary (Last 24 hours) at 08/29/2020 1309 Last data filed at 08/29/2020 0500 Gross per 24 hour  Intake 2288.89 ml  Output --  Net 2288.89 ml   Weight change:  Exam:   General:  Pt is alert, follows commands appropriately, not in acute distress  HEENT: No icterus, No thrush, No neck mass, /AT  Cardiovascular: RRR, S1/S2, no rubs, no gallops  Respiratory: CTA bilaterally, no wheezing, no crackles, no rhonchi  Abdomen: Soft/+BS, generalized tender, non distended, no guarding  Extremities: No edema, No lymphangitis, No petechiae, No rashes, no synovitis   Data Reviewed: I have personally reviewed following labs and imaging studies Basic Metabolic Panel: Recent Labs  Lab 08/28/20 0630 08/28/20 0654 08/28/20 1009 08/29/20 0715  NA 135 135  --  139  K <2.0* 4.4 <2.0* 2.1*  CL 87* 90*  --  103  CO2 29  --   --  25  GLUCOSE 208* 211*  --  218*  BUN 19 26*  --  16   CREATININE 1.08* 1.00  --  0.73  CALCIUM 9.2  --   --  7.9*  MG  --   --  2.2 2.0   Liver Function Tests: Recent Labs  Lab 08/28/20 0630 08/28/20 1009 08/29/20 0715  AST 32  --  18  ALT 23  --  17  ALKPHOS 107  --  73  BILITOT 2.3* 2.1* 1.8*  PROT 8.4*  --  6.2*  ALBUMIN 4.0  --  3.1*   No results for input(s): LIPASE, AMYLASE in the last 168 hours. Recent Labs  Lab 08/28/20 1009 08/29/20 0715  AMMONIA 43* 35   Coagulation Profile: No results for input(s): INR, PROTIME in the last 168 hours. CBC: Recent Labs  Lab 08/28/20 0630 08/28/20 0654 08/29/20 0715  WBC 8.4  --  7.8  NEUTROABS 6.4  --   --   HGB 17.6* 19.0* 14.1  HCT 50.4* 56.0* 41.6  MCV 103.5*  --  104.5*  PLT 157  --  123*   Cardiac Enzymes: Recent Labs  Lab 08/28/20 0630  CKTOTAL 177   BNP: Invalid input(s): POCBNP CBG: Recent Labs  Lab 08/28/20 0639 08/28/20 1655 08/28/20 2058 08/29/20 0735 08/29/20 1112  GLUCAP 210* 226* 208* 215* 209*   HbA1C: Recent Labs  08/28/20 1009  HGBA1C 5.6   Urine analysis:    Component Value Date/Time   COLORURINE AMBER (A) 08/28/2020 0604   APPEARANCEUR CLOUDY (A) 08/28/2020 0604   LABSPEC 1.012 08/28/2020 0604   PHURINE 6.0 08/28/2020 0604   GLUCOSEU NEGATIVE 08/28/2020 0604   HGBUR SMALL (A) 08/28/2020 0604   BILIRUBINUR NEGATIVE 08/28/2020 0604   KETONESUR 20 (A) 08/28/2020 0604   PROTEINUR 30 (A) 08/28/2020 0604   UROBILINOGEN 0.2 09/02/2015 1735   NITRITE NEGATIVE 08/28/2020 0604   LEUKOCYTESUR TRACE (A) 08/28/2020 0604   Sepsis Labs: @LABRCNTIP (procalcitonin:4,lacticidven:4) ) Recent Results (from the past 240 hour(s))  SARS Coronavirus 2 by RT PCR (hospital order, performed in [facility] hospital lab) Nasopharyngeal Nasopharyngeal Swab     Status: None   Collection Time: 08/28/20  7:22 AM   Specimen: Nasopharyngeal Swab  Result Value Ref Range Status   SARS Coronavirus 2 NEGATIVE NEGATIVE Final    Comment: (NOTE) SARS-CoV-2  target nucleic acids are NOT DETECTED.  The SARS-CoV-2 RNA is generally detectable in upper and lower respiratory specimens during the acute phase of infection. The lowest concentration of SARS-CoV-2 viral copies this assay can detect is 250 copies / mL. A negative result does not preclude SARS-CoV-2 infection and should not be used as the sole basis for treatment or other patient management decisions.  A negative result may occur with improper specimen collection / handling, submission of specimen other than nasopharyngeal swab, presence of viral mutation(s) within the areas targeted by this assay, and inadequate number of viral copies (<250 copies / mL). A negative result must be combined with clinical observations, patient history, and epidemiological information.  Fact Sheet for Patients:   StrictlyIdeas.no  Fact Sheet for Healthcare Providers: BankingDealers.co.za  This test is not yet approved or  cleared by the [place] FDA and has been authorized for detection and/or diagnosis of SARS-CoV-2 by FDA under an Emergency Use Authorization (EUA).  This EUA will remain in effect (meaning this test can be used) for the duration of the COVID-19 declaration under Section 564(b)(1) of the Act, 21 U.S.C. section 360bbb-3(b)(1), unless the authorization is terminated or revoked sooner.  Performed at [facility], [address] [place], [place] [address]   C Difficile Quick Screen w PCR reflex     Status: None   Collection Time: 08/28/20  7:29 AM   Specimen: STOOL  Result Value Ref Range Status   C Diff antigen NEGATIVE NEGATIVE Final   C Diff toxin NEGATIVE NEGATIVE Final   C Diff interpretation No C. difficile detected.  Final    Comment: Performed at [facility], [address], [place], [place] [address]  Culture, blood (Routine X 2) w Reflex to ID Panel     Status: None (Preliminary result)   Collection Time: 08/28/20 10:09 AM     Specimen: BLOOD  Result Value Ref Range Status   Specimen Description BLOOD LEFT HAND  Final   Special Requests   Final    BOTTLES DRAWN AEROBIC AND ANAEROBIC Blood Culture adequate volume   Culture   Final    NO GROWTH 1 DAY Performed at [facility], [address], [place], [place] [address]    Report Status PENDING  Incomplete  Culture, blood (Routine X 2) w Reflex to ID Panel     Status: None (Preliminary result)   Collection Time: 08/28/20 10:09 AM   Specimen: BLOOD  Result Value Ref Range Status   Specimen Description BLOOD RIGHT HAND  Final  Special Requests   Final    BOTTLES DRAWN AEROBIC AND ANAEROBIC Blood Culture adequate volume   Culture   Final    NO GROWTH 1 DAY Performed at [facility], [address], [place], [place] [address]    Report Status PENDING  Incomplete     Scheduled Meds: . folic acid  1 mg Intravenous Daily  . levothyroxine  75 mcg Intravenous Daily  . mouth rinse  15 mL Mouth Rinse BID  . potassium chloride SA  40 mEq Oral Daily  . thiamine  100 mg Oral Daily  . vitamin B-12  250 mcg Oral Daily   Continuous Infusions: . 0.9 % NaCl with KCl 40 mEq / L 125 mL/hr at 08/29/20 1227  . potassium chloride 10 mEq (08/29/20 1227)    Procedures/Studies: CT HEAD WO CONTRAST  Result Date: 08/28/2020 CLINICAL DATA:  Mental status change.  Not performing ADLs EXAM: CT HEAD WITHOUT CONTRAST TECHNIQUE: Contiguous axial images were obtained from the base of the skull through the vertex without intravenous contrast. COMPARISON:  06/11/2018 FINDINGS: Brain: Remote left parietal infarction centered at the cortex, moderate size. Dilated perivascular space below the left putamen. Chronic lacune at the right caudate head. Best seen on coronal reformats is a thin hyper to isodense collection along the left cerebral convexity, frontotemporal. No hydrocephalus, acute infarct, or masslike finding. Vascular: No hyperdense vessel or unexpected calcification. Skull:  Normal. Negative for fracture or focal lesion. Sinuses/Orbits: No acute finding. Critical Value/emergent results were called by telephone at the time of interpretation on 08/28/2020 at 7:09 am to provider [facility] , who verbally acknowledged these results. IMPRESSION: 1. Thin subdural hematoma along the lateral left frontal convexity, only 4 mm in maximal thickness. 2. Remote infarcts at the left parietal cortex and right caudate head. Electronically Signed   By: Monte Fantasia M.D.   On: 08/28/2020 07:09   DG Chest Port 1 View  Result Date: 08/28/2020 CLINICAL DATA:  Weakness.  Smoker. EXAM: PORTABLE CHEST 1 VIEW COMPARISON:  06/11/2018 FINDINGS: Cardiac enlargement. Central peribronchial thickening with diffuse interstitial pattern to the lungs. This could represent edema, pneumonia, or chronic fibrosis. No consolidation. No pleural effusions. No pneumothorax. Mediastinal contours appear intact. Calcification of the aorta. IMPRESSION: Cardiac enlargement. Central peribronchial thickening with diffuse interstitial pattern to the lungs. Electronically Signed   By: Lucienne Capers M.D.   On: 08/28/2020 06:27   ECHOCARDIOGRAM COMPLETE  Result Date: 08/29/2020    ECHOCARDIOGRAM REPORT   Patient Name:   COURTLAND COPPA Date of Exam: 08/29/2020 Medical Rec #:  [id number]           Height:       65.0 in Accession #:    [id number]          Weight:       177.0 lb Date of Birth:  11/12/1961            BSA:          1.878 m Patient Age:    37 years            BP:           113/65 mmHg Patient Gender: F                   HR:           71 bpm. Exam Location:  [facility] Procedure: 2D Echo Indications:    Elevated Troponin  History:  Patient has prior history of Echocardiogram examinations, most                 recent 06/12/2018. Risk Factors:Hypertension, Current Smoker and                 Dyslipidemia. Polypharmacy, GERD.  Sonographer:    Leavy Cella RDCS (AE) Referring Phys: [phone] Shalayah Beagley  IMPRESSIONS  1. Left ventricular ejection fraction, by estimation, is 70 to 75%. The left ventricle has hyperdynamic function. The left ventricle has no regional wall motion abnormalities. There is moderate to severe left ventricular hypertrophy. No significant LVOT  gradient. Left ventricular diastolic parameters are indeterminate.  2. Right ventricular systolic function is normal. The right ventricular size is normal. Tricuspid regurgitation signal is inadequate for assessing PA pressure.  3. The mitral valve is grossly normal. Trivial mitral valve regurgitation.  4. The aortic valve is tricuspid. Aortic valve regurgitation is not visualized.  5. The inferior vena cava is normal in size with greater than 50% respiratory variability, suggesting right atrial pressure of 3 mmHg.  6. Prominent pericardial fat pad. FINDINGS  Left Ventricle: Left ventricular ejection fraction, by estimation, is 70 to 75%. The left ventricle has hyperdynamic function. The left ventricle has no regional wall motion abnormalities. The left ventricular internal cavity size was normal in size. There is moderate left ventricular hypertrophy. Left ventricular diastolic parameters are indeterminate. Right Ventricle: The right ventricular size is normal. No increase in right ventricular wall thickness. Right ventricular systolic function is normal. Tricuspid regurgitation signal is inadequate for assessing PA pressure. Left Atrium: Left atrial size was normal in size. Right Atrium: Right atrial size was normal in size. Pericardium: There is no evidence of pericardial effusion. Presence of pericardial fat pad. Mitral Valve: The mitral valve is grossly normal. Mild mitral annular calcification. Trivial mitral valve regurgitation. Tricuspid Valve: The tricuspid valve is grossly normal. Tricuspid valve regurgitation is trivial. Aortic Valve: The aortic valve is tricuspid. There is mild aortic valve annular calcification. Aortic valve regurgitation  is not visualized. Pulmonic Valve: The pulmonic valve was grossly normal. Pulmonic valve regurgitation is trivial. Aorta: The aortic root is normal in size and structure. Venous: The inferior vena cava is normal in size with greater than 50% respiratory variability, suggesting right atrial pressure of 3 mmHg. IAS/Shunts: No atrial level shunt detected by color flow Doppler.  LEFT VENTRICLE PLAX 2D LVIDd:         3.56 cm  Diastology LVIDs:         2.34 cm  LV e' medial:    3.81 cm/s LV PW:         1.74 cm  LV E/e' medial:  18.1 LV IVS:        1.17 cm  LV e' lateral:   2.92 cm/s LVOT diam:     2.00 cm  LV E/e' lateral: 23.6 LVOT Area:     3.14 cm  RIGHT VENTRICLE RV S prime:     14.10 cm/s TAPSE (M-mode): 1.9 cm LEFT ATRIUM             Index       RIGHT ATRIUM           Index LA diam:        3.90 cm 2.08 cm/m  RA Area:     12.50 cm LA Vol (A2C):   43.3 ml 23.05 ml/m RA Volume:   28.90 ml  15.39 ml/m LA Vol (A4C):   35.1 ml 18.69  ml/m LA Biplane Vol: 42.4 ml 22.58 ml/m   AORTA Ao Root diam: 2.80 cm MITRAL VALVE MV Area (PHT): 2.08 cm     SHUNTS MV Decel Time: 364 msec     Systemic Diam: 2.00 cm MV E velocity: 69.00 cm/s MV A velocity: 108.00 cm/s MV E/A ratio:  0.64 Rozann Lesches MD Electronically signed by Rozann Lesches MD Signature Date/Time: 08/29/2020/11:50:24 AM    Final     Orson Eva, DO  [facility]  If 7PM-7AM, please contact night-coverage [email or web address] Password [email or web address] 08/29/2020, 1:09 PM   LOS: 1 day

## 2020-08-30 LAB — COMPREHENSIVE METABOLIC PANEL
ALT: 16 U/L (ref 0–44)
AST: 16 U/L (ref 15–41)
Albumin: 2.9 g/dL — ABNORMAL LOW (ref 3.5–5.0)
Alkaline Phosphatase: 69 U/L (ref 38–126)
Anion gap: 11 (ref 5–15)
BUN: 13 mg/dL (ref 8–23)
CO2: 22 mmol/L (ref 22–32)
Calcium: 7.9 mg/dL — ABNORMAL LOW (ref 8.9–10.3)
Chloride: 111 mmol/L (ref 98–111)
Creatinine, Ser: 0.75 mg/dL (ref 0.44–1.00)
GFR calc Af Amer: >60 mL/min (ref >60)
GFR calc non Af Amer: >60 mL/min (ref >60)
Glucose, Bld: 170 mg/dL — ABNORMAL HIGH (ref 70–99)
Potassium: 2.7 mmol/L — CL (ref 3.5–5.1)
Sodium: 144 mmol/L (ref 135–145)
Total Bilirubin: 1.3 mg/dL — ABNORMAL HIGH (ref 0.3–1.2)
Total Protein: 6 g/dL — ABNORMAL LOW (ref 6.5–8.1)

## 2020-08-30 LAB — CBC WITH DIFFERENTIAL/PLATELET
Abs Immature Granulocytes: 0.06 10*3/uL (ref 0.00–0.07)
Basophils Absolute: 0 10*3/uL (ref 0.0–0.1)
Basophils Relative: 0 %
Eosinophils Absolute: 0 10*3/uL (ref 0.0–0.5)
Eosinophils Relative: 0 %
HCT: 43.2 % (ref 36.0–46.0)
Hemoglobin: 14 g/dL (ref 12.0–15.0)
Immature Granulocytes: 1 %
Lymphocytes Relative: 28 %
Lymphs Abs: 2.1 10*3/uL (ref 0.7–4.0)
MCH: 35.2 pg — ABNORMAL HIGH (ref 26.0–34.0)
MCHC: 32.4 g/dL (ref 30.0–36.0)
MCV: 108.5 fL — ABNORMAL HIGH (ref 80.0–100.0)
Monocytes Absolute: 0.4 10*3/uL (ref 0.1–1.0)
Monocytes Relative: 5 %
Neutro Abs: 4.8 10*3/uL (ref 1.7–7.7)
Neutrophils Relative %: 66 %
Platelets: 128 10*3/uL — ABNORMAL LOW (ref 150–400)
RBC: 3.98 MIL/uL (ref 3.87–5.11)
RDW: 18.5 % — ABNORMAL HIGH (ref 11.5–15.5)
WBC: 7.3 10*3/uL (ref 4.0–10.5)
nRBC: 0 % (ref 0.0–0.2)

## 2020-08-30 LAB — LACTIC ACID, PLASMA: Lactic Acid, Venous: 1.2 mmol/L (ref 0.5–1.9)

## 2020-08-30 LAB — MAGNESIUM: Magnesium: 1.8 mg/dL (ref 1.7–2.4)

## 2020-08-30 MED ORDER — POTASSIUM CHLORIDE 10 MEQ/100ML IV SOLN
10.0000 meq | INTRAVENOUS | Status: AC
  Administered 2020-08-30 (×4): 10 meq via INTRAVENOUS
  Filled 2020-08-30 (×4): qty 100

## 2020-08-30 MED ORDER — LOPERAMIDE HCL 2 MG PO CAPS
2.0000 mg | ORAL_CAPSULE | Freq: Four times a day (QID) | ORAL | Status: DC | PRN
  Administered 2020-08-30 – 2020-09-03 (×6): 2 mg via ORAL
  Filled 2020-08-30 (×6): qty 1

## 2020-08-30 MED ORDER — CIPROFLOXACIN IN D5W 400 MG/200ML IV SOLN
400.0000 mg | Freq: Two times a day (BID) | INTRAVENOUS | Status: DC
  Administered 2020-08-30 – 2020-09-02 (×7): 400 mg via INTRAVENOUS
  Filled 2020-08-30 (×7): qty 200

## 2020-08-30 MED ORDER — METRONIDAZOLE IN NACL 5-0.79 MG/ML-% IV SOLN
500.0000 mg | Freq: Three times a day (TID) | INTRAVENOUS | Status: DC
  Administered 2020-08-30 – 2020-09-08 (×27): 500 mg via INTRAVENOUS
  Filled 2020-08-30 (×28): qty 100

## 2020-08-30 MED ORDER — NYSTATIN 100000 UNIT/GM EX POWD
Freq: Three times a day (TID) | CUTANEOUS | Status: DC
  Filled 2020-08-30 (×2): qty 15

## 2020-08-30 NOTE — Progress Notes (Signed)
PROGRESS NOTE  Deborah Russo  DOB: 10/31/1961  PCP: Baruch Gouty, FNP LZJ:[id number]  DOA: 08/28/2020  LOS: 2 days   Chief Complaint  Patient presents with  . Altered Mental Status    Brief narrative: Deborah Russo is a 59 y.o. female with PMH of HTN, HLD, prediabetes, stroke, hypothyroidism, vitamin B12 deficiency, thrombocytopenia, GERD, chronic migraine, chronic pain, chronic nausea, vomiting, abdominal pain. Patient presented to the ED on 08/28/2020 with altered mental status.  Caregiver found her confused, lying in her urine and feces. EMS brought her to ED.    In the ED, patient had a low-grade temperature of 99.4, initially hypotensive with systolic blood pressure in 70s, blood pressure improved with IV fluids to 90s. Labs showed potassium level less than 2, WBC normal.  Lactic acid level is elevated to 2.3.  CT of the brain showed a very small subdural hematoma on the left lateral convexity.  Chest x-ray showed mild increased interstitial markings without consolidation.  Urinalysis was negative for pyuria.   Patient was admitted to hospitalist service for further evaluation management. CT abdomen and pelvis obtained subsequently showed an extensive colonic wall thickening throughout, most conspicuous in the transverse colon, and there is fluid in the distal colon and rectum. The terminal ileum may also be involved. Findings are consistent with nonspecific infectious, inflammatory, or ischemic colitis, differential considerations generally including inflammatory bowel disease given distribution and recurrent nature.  Subjective: Patient was seen and examined this afternoon.  Middle-aged Caucasian female.  Looks older for her age. Chart reviewed. No fever last 24 hours.  Blood pressure remains mostly normal range, breathing on room air Labs this morning with potassium level improved but still low at 2.7, magnesium 1.8, CBC unremarkable except for MCV elevated to  108  Assessment/Plan: Severe sepsis secondary to acute gastroenteritis -Brought in with diarrhea, dehydration, lethargy, confusion -Initial blood pressure was low in 70s resulting in elevated lactic acid 2.3. -With IV resuscitation, blood pressure and lactic acid improved. -CT scan of abdomen pelvis as above showed extensive colonic wall thickening suggestive of colitis. -Blood cultures negative to date. -GI vaginal panel and C. difficile pending. -Diarrhea -continues. -Currently not on continued antibiotics.    -I started her on IV ciprofloxacin this morning.  Recent Labs  Lab 08/28/20 0630 08/28/20 1009 08/29/20 0715 08/30/20 0745  WBC 8.4  --  7.8 7.3  LATICACIDVEN 2.3*  --   --  1.2  PROCALCITON  --  <0.10  --   --    Acute metabolic encephalopathy -Multifactorial including sepsis, dehydration, electrolyte derangements, hypothyroidism, subdural hematoma. -Mental status gradually improving but not back to baseline.  Subdural hematoma -Previous hospitalist spoke with neurosurgery, Dr. Merlyn Albert conservative/nonoperative management -Recommended to hold aspirin for a week  Impaired mobility, generalized weakness -Care dependent at home. -PT eval recommended SNF.  Hypothyroidism -Significantly elevated TSH to 20. -Unclear if she was compliant to Synthroid at home. -Continue Synthroid at current dose. Recent Labs    08/28/20 1009  TSH 20.018*   Vitamin B12 deficiency -Continue supplement.  Hypokalemia/hypomagnesemia -Improving, but still remains low, 2.7 this morning.  IV replacement ordered. Recent Labs  Lab 08/28/20 0630 08/28/20 0654 08/28/20 1009 08/29/20 0715 08/30/20 0745  K <2.0* 4.4 <2.0* 2.1* 2.7*  MG  --   --  2.2 2.0 1.8   Liver cirrhosis without ascites -noted on CT abd/pelvis in past -Ammonia level normal  Chronic pain -Continue percocet at lower dose  Hyperlipidemia -Statin  Essential hypertension -clonidine  on hold  secondary to soft blood pressure  Elevated troponin -Likely secondary to demand ischemia -EKG without concerning ischemic changes -Echocardiogram--EF 70-75%, mod LVH, no WMA  Mobility: PT eval Code Status:   Code Status: Full Code  Nutritional status: Body mass index is 29.46 kg/m.     Diet Order            Diet Carb Modified Fluid consistency: Thin; Room service appropriate? Yes  Diet effective now                 DVT prophylaxis: SCDs Start: 08/28/20 1842   Antimicrobials:  IV ciprofloxacin Fluid: Normal saline at 75 mill per hour Consultants: None Family Communication:  None at bedside  Status is: Inpatient  Remains inpatient appropriate because:Persistent severe electrolyte disturbances, Ongoing diagnostic testing needed not appropriate for outpatient work up and IV treatments appropriate due to intensity of illness or inability to take PO   Dispo: The patient is from: Home              Anticipated d/c is to: SNF likely              Anticipated d/c date is: 3 days              Patient currently is not medically stable to d/c.       Infusions:  . 0.9 % NaCl with KCl 40 mEq / L Stopped (08/29/20 1608)  . ciprofloxacin 400 mg (08/30/20 1028)    Scheduled Meds: . folic acid  1 mg Intravenous Daily  . levothyroxine  75 mcg Intravenous Daily  . mouth rinse  15 mL Mouth Rinse BID  . pantoprazole (PROTONIX) IV  40 mg Intravenous Q12H  . potassium chloride SA  40 mEq Oral Daily  . thiamine  100 mg Oral Daily  . vitamin B-12  250 mcg Oral Daily    Antimicrobials: Anti-infectives (From admission, onward)   Start     Dose/Rate Route Frequency Ordered Stop   08/30/20 1030  ciprofloxacin (CIPRO) IVPB 400 mg        400 mg 200 mL/hr over 60 Minutes Intravenous Every 12 hours 08/30/20 1011        PRN meds: acetaminophen **OR** acetaminophen, ondansetron **OR** ondansetron (ZOFRAN) IV, traMADol   Objective: Vitals:   08/29/20 0553 08/29/20 1600  BP:  113/65 (!) 166/72  Pulse: 71 72  Resp: 16 18  Temp: 98.5 F (36.9 C) 97.6 F (36.4 C)  SpO2: 100% 100%    Intake/Output Summary (Last 24 hours) at 08/30/2020 1429 Last data filed at 08/30/2020 1003 Gross per 24 hour  Intake 2742.38 ml  Output 1000 ml  Net 1742.38 ml   Filed Weights   08/28/20 1649  Weight: 80.3 kg   Weight change:  Body mass index is 29.46 kg/m.   Physical Exam: General exam: Appears calm and comfortable.  Not in physical distress Skin: No rashes, lesions or ulcers. HEENT: Atraumatic, normocephalic, supple neck, no obvious bleeding Lungs: Clear to auscultation bilaterally CVS: Regular rate and rhythm, no murmur GI/Abd soft, mild mid abdominal tenderness, nondistended, bowel sound present CNS: Alert, awake, oriented to place Psychiatry: Depressed look Extremities: Trace bilateral pedal edema  Data Review: I have personally reviewed the laboratory data and studies available.  Recent Labs  Lab 08/28/20 0630 08/28/20 0654 08/29/20 0715 08/30/20 0745  WBC 8.4  --  7.8 7.3  NEUTROABS 6.4  --   --  4.8  HGB 17.6* 19.0* 14.1 14.0  HCT 50.4* 56.0* 41.6 43.2  MCV 103.5*  --  104.5* 108.5*  PLT 157  --  123* 128*   Recent Labs  Lab 08/28/20 0630 08/28/20 0654 08/28/20 1009 08/29/20 0715 08/30/20 0745  NA 135 135  --  139 144  K <2.0* 4.4 <2.0* 2.1* 2.7*  CL 87* 90*  --  103 111  CO2 29  --   --  25 22  GLUCOSE 208* 211*  --  218* 170*  BUN 19 26*  --  16 13  CREATININE 1.08* 1.00  --  0.73 0.75  CALCIUM 9.2  --   --  7.9* 7.9*  MG  --   --  2.2 2.0 1.8    F/u labs ordered  Signed, Terrilee Croak, MD [facility] 08/30/2020

## 2020-08-30 NOTE — Progress Notes (Signed)
Has complained of nausea and abd pain today which she says abd pain is chronic since childbirth.  Has not vomited. Gave zofran  And for diarrhea gave immodium.  Nystatin powder for redness to perineal area.

## 2020-08-31 LAB — CBC WITH DIFFERENTIAL/PLATELET
Abs Immature Granulocytes: 0.03 10*3/uL (ref 0.00–0.07)
Basophils Absolute: 0 10*3/uL (ref 0.0–0.1)
Basophils Relative: 0 %
Eosinophils Absolute: 0 10*3/uL (ref 0.0–0.5)
Eosinophils Relative: 1 %
HCT: 37.9 % (ref 36.0–46.0)
Hemoglobin: 12.1 g/dL (ref 12.0–15.0)
Immature Granulocytes: 1 %
Lymphocytes Relative: 24 %
Lymphs Abs: 1.2 10*3/uL (ref 0.7–4.0)
MCH: 34.7 pg — ABNORMAL HIGH (ref 26.0–34.0)
MCHC: 31.9 g/dL (ref 30.0–36.0)
MCV: 108.6 fL — ABNORMAL HIGH (ref 80.0–100.0)
Monocytes Absolute: 0.3 10*3/uL (ref 0.1–1.0)
Monocytes Relative: 5 %
Neutro Abs: 3.4 10*3/uL (ref 1.7–7.7)
Neutrophils Relative %: 69 %
Platelets: 85 10*3/uL — ABNORMAL LOW (ref 150–400)
RBC: 3.49 MIL/uL — ABNORMAL LOW (ref 3.87–5.11)
RDW: 18.3 % — ABNORMAL HIGH (ref 11.5–15.5)
WBC: 4.9 10*3/uL (ref 4.0–10.5)
nRBC: 0 % (ref 0.0–0.2)

## 2020-08-31 LAB — BASIC METABOLIC PANEL
Anion gap: 8 (ref 5–15)
BUN: 13 mg/dL (ref 8–23)
CO2: 20 mmol/L — ABNORMAL LOW (ref 22–32)
Calcium: 7.5 mg/dL — ABNORMAL LOW (ref 8.9–10.3)
Chloride: 108 mmol/L (ref 98–111)
Creatinine, Ser: 0.76 mg/dL (ref 0.44–1.00)
GFR calc Af Amer: >60 mL/min (ref >60)
GFR calc non Af Amer: >60 mL/min (ref >60)
Glucose, Bld: 141 mg/dL — ABNORMAL HIGH (ref 70–99)
Potassium: 2.6 mmol/L — CL (ref 3.5–5.1)
Sodium: 136 mmol/L (ref 135–145)

## 2020-08-31 LAB — PHOSPHORUS: Phosphorus: 0.7 mg/dL — CL (ref 2.5–4.6)

## 2020-08-31 LAB — MAGNESIUM: Magnesium: 1.5 mg/dL — ABNORMAL LOW (ref 1.7–2.4)

## 2020-08-31 MED ORDER — MAGNESIUM OXIDE 400 (241.3 MG) MG PO TABS
400.0000 mg | ORAL_TABLET | Freq: Every day | ORAL | Status: DC
  Administered 2020-09-01 – 2020-09-02 (×2): 400 mg via ORAL
  Filled 2020-08-31 (×2): qty 1

## 2020-08-31 MED ORDER — MAGNESIUM SULFATE 2 GM/50ML IV SOLN
2.0000 g | Freq: Once | INTRAVENOUS | Status: AC
  Administered 2020-08-31: 2 g via INTRAVENOUS
  Filled 2020-08-31: qty 50

## 2020-08-31 MED ORDER — SODIUM PHOSPHATES 45 MMOLE/15ML IV SOLN
30.0000 mmol | Freq: Once | INTRAVENOUS | Status: AC
  Administered 2020-08-31: 30 mmol via INTRAVENOUS
  Filled 2020-08-31: qty 10

## 2020-08-31 MED ORDER — OXYCODONE HCL ER 20 MG PO T12A
20.0000 mg | EXTENDED_RELEASE_TABLET | Freq: Two times a day (BID) | ORAL | Status: DC
  Administered 2020-08-31 – 2020-09-09 (×18): 20 mg via ORAL
  Filled 2020-08-31 (×20): qty 1

## 2020-08-31 MED ORDER — POTASSIUM CHLORIDE CRYS ER 20 MEQ PO TBCR
40.0000 meq | EXTENDED_RELEASE_TABLET | Freq: Once | ORAL | Status: AC
  Administered 2020-08-31: 40 meq via ORAL
  Filled 2020-08-31: qty 2

## 2020-08-31 MED ORDER — K PHOS MONO-SOD PHOS DI & MONO 155-852-130 MG PO TABS
500.0000 mg | ORAL_TABLET | Freq: Every day | ORAL | Status: DC
  Administered 2020-08-31 – 2020-09-02 (×3): 500 mg via ORAL
  Filled 2020-08-31 (×3): qty 2

## 2020-08-31 MED ORDER — OXYCODONE-ACETAMINOPHEN 5-325 MG PO TABS
1.0000 | ORAL_TABLET | Freq: Three times a day (TID) | ORAL | Status: DC | PRN
  Administered 2020-09-01 – 2020-09-09 (×12): 1 via ORAL
  Filled 2020-08-31 (×12): qty 1

## 2020-08-31 NOTE — TOC Initial Note (Addendum)
Transition of Care [facility]) - Initial/Assessment Note    Patient Details  Name: Deborah Russo MRN: [id number] Date of Birth: [birth year]  [facility]) CM/SW Contact:    Natasha Bence, LCSW Phone Number: 08/31/2020, 4:41 PM  Clinical Narrative:                 Patient is a 59 year old female admitted for Acute metabolic encephalopathy. Patient's daughter listed as Tonita Bills is now deceased. CSW contacted [employer] for assistance with obtaining patient family members. The [place] were able to identify Bruce as the patient's son and Gregary Signs as the patient's son in law. CSW received contact information and added it to Epic. CSW to clarify last names. Patient's daughter inlaw discussed that prior to admission she had discussed placement with [facility]. Patient's daughter and patient reported that they are agreeable to [place] [facility]'s and [facility]. CSW has faxed out referrals to the above SNF placements. TOC to follow.  Expected Discharge Plan: Skilled Nursing Facility Barriers to Discharge: Continued Medical Work up   Patient Goals and CMS Choice Patient states their goals for this hospitalization and ongoing recovery are:: Rehab with SNF   Choice offered to / list presented to : Patient, Adult Children  Expected Discharge Plan and Services Expected Discharge Plan: [place]       Living arrangements for the past 2 months: Single Family Home                                      Prior Living Arrangements/Services Living arrangements for the past 2 months: Single Family Home Lives with:: Self Patient language and need for interpreter reviewed:: Yes        Need for Family Participation in Patient Care: Yes (Comment) Care giver support system in place?: Yes (comment)   Criminal Activity/Legal Involvement Pertinent to Current Situation/Hospitalization: No - Comment as needed  Activities of  Daily Living Home Assistive Devices/Equipment: Walker (specify type) ADL Screening (condition at time of admission) Patient's cognitive ability adequate to safely complete daily activities?: No Is the patient deaf or have difficulty hearing?: No Does the patient have difficulty seeing, even when wearing glasses/contacts?: No Does the patient have difficulty concentrating, remembering, or making decisions?: Yes Patient able to express need for assistance with ADLs?: No Does the patient have difficulty dressing or bathing?: Yes Independently performs ADLs?: No Communication: Independent Dressing (OT): Needs assistance Is this a change from baseline?: Pre-admission baseline Grooming: Needs assistance Is this a change from baseline?: Pre-admission baseline Feeding: Needs assistance Is this a change from baseline?: Pre-admission baseline Bathing: Needs assistance Is this a change from baseline?: Pre-admission baseline Toileting: Needs assistance Is this a change from baseline?: Pre-admission baseline In/Out Bed: Needs assistance Is this a change from baseline?: Pre-admission baseline Walks in Home: Needs assistance Is this a change from baseline?: Pre-admission baseline Does the patient have difficulty walking or climbing stairs?: Yes Weakness of Legs: Both Weakness of Arms/Hands: None  Permission Sought/Granted      Share Information with NAME: Gregary Signs  Permission granted to share info w AGENCY: [facility] and [employer] granted to share info w Relationship: Daughter in Oncologist granted to share info w Contact Information: [phone]  Emotional Assessment Appearance:: Disheveled     Orientation: : Oriented to Self, Oriented to Place Alcohol / Substance Use:  Not Applicable Psych Involvement: No (comment)  Admission diagnosis:  Hypokalemia [E87.6] SDH (subdural hematoma) ([place]) [S06.5X9A] Altered mental status, unspecified altered mental status type  [I77.82] Acute metabolic encephalopathy [U23.53] Patient Active Problem List   Diagnosis Date Noted  . Acute metabolic encephalopathy 61/44/3154  . SDH (subdural hematoma) ([place])   . Hemiparesis of right dominant side as late effect of cerebral infarction ([place]) 02/06/2019  . Vitamin D deficiency 02/06/2019  . Mixed hyperlipidemia 02/06/2019  . Gastroesophageal reflux disease without esophagitis 02/06/2019  . B12 deficiency 02/06/2019  . Thrombocytopenia ([place]) 02/06/2019  . Adrenal insufficiency ([place]) 02/06/2019  . Morbid obesity ([place]) 02/06/2019  . Prediabetes 09/21/2018  . Cirrhosis of liver without ascites ([place])   . Polypharmacy   . DNR (do not resuscitate) discussion   . Current smoker 03/29/2017  . HTN (hypertension) 03/29/2017  . Aortic atherosclerosis ([place]) 09/29/2016  . Chronic migraine 09/29/2016  . H/O Clostridium difficile infection 07/13/2014  . Diarrhea 07/13/2014  . FTT (failure to thrive) in adult 07/13/2014  . Chronic abdominal pain   . Hypothyroidism 06/19/2014  . Hypokalemia 06/19/2014  . Chronic pain 06/19/2014  . Anemia 06/19/2014   PCP:  Baruch Gouty, FNP Pharmacy:   [facility], [place] [address] Phone: [phone] Fax: [place], [place] - [address] [place] [address] Phone: [phone] Fax: [phone]     Social Determinants of Health (SDOH) Interventions    Readmission Risk Interventions Readmission Risk Prevention Plan 08/31/2020  Transportation Screening Complete  PCP or Specialist Appt within 5-7 Days Complete  Home Care Screening Complete  Medication Review (RN CM) Complete  Some recent data might be hidden

## 2020-08-31 NOTE — Progress Notes (Signed)
PROGRESS NOTE  Deborah Russo  DOB: 08/31/1961  PCP: Baruch Gouty, FNP OHY:[id number]  DOA: 08/28/2020  LOS: 3 days   Chief Complaint  Patient presents with  . Altered Mental Status    Brief narrative: Deborah Russo is a 59 y.o. female with PMH of HTN, HLD, prediabetes, stroke, hypothyroidism, vitamin B12 deficiency, thrombocytopenia, GERD, chronic migraine, chronic pain, chronic nausea, vomiting, abdominal pain. Patient presented to the ED on 08/28/2020 with altered mental status.  Caregiver found her confused, lying in her urine and feces. EMS brought her to ED.    In the ED, patient had a low-grade temperature of 99.4, initially hypotensive with systolic blood pressure in 70s, blood pressure improved with IV fluids to 90s. Labs showed potassium level less than 2, WBC normal.  Lactic acid level is elevated to 2.3.  CT of the brain showed a very small subdural hematoma on the left lateral convexity.  Chest x-ray showed mild increased interstitial markings without consolidation.  Urinalysis was negative for pyuria.   Patient was admitted to hospitalist service for further evaluation management. CT abdomen and pelvis obtained subsequently showed an extensive colonic wall thickening throughout, most conspicuous in the transverse colon, and there is fluid in the distal colon and rectum. The terminal ileum may also be involved. Findings are consistent with nonspecific infectious, inflammatory, or ischemic colitis, differential considerations generally including inflammatory bowel disease given distribution and recurrent nature.  Subjective: Patient was seen and examined this morning. Looks tired.  Complains of constant abdominal pain.  Chart reviewed. Blood pressure 90s Labs with potassium level low at 2.6, phosphorus low at 0.7, magnesium low at 1.5.  Assessment/Plan: Severe sepsis secondary to acute gastroenteritis -Brought in with diarrhea, dehydration, lethargy,  confusion -Initial blood pressure was low in 70s resulting in elevated lactic acid 2.3. -With IV resuscitation, blood pressure and lactic acid improved. -CT scan of abdomen pelvis as above showed extensive colonic wall thickening suggestive of colitis. -Blood cultures negative to date. -GI pathogen panel and C. difficile negative. -No fever last 24 hours.  WBC normal.  Lactic acid normalized.  Procalcitonin level normal.  However patient continues to have diarrhea.  Diarrhea -continues.  Empirically on IV ciprofloxacin and IV Flagyl. Recent Labs  Lab 08/28/20 0630 08/28/20 1009 08/29/20 0715 08/30/20 0745 08/31/20 0807  WBC 8.4  --  7.8 7.3 4.9  LATICACIDVEN 2.3*  --   --  1.2  --   PROCALCITON  --  <0.10  --   --   --    Acute metabolic encephalopathy -Multifactorial including sepsis, dehydration, electrolyte derangements, hypothyroidism, subdural hematoma. -Mental status gradually improving but not back to baseline.  Subdural hematoma -Previous hospitalist spoke with neurosurgery, Dr. Merlyn Albert conservative/nonoperative management -Recommended to hold aspirin for a week  Impaired mobility, generalized weakness -Care dependent at home. -PT eval recommended SNF.  Hypothyroidism -Significantly elevated TSH to 20. -Unclear if she was compliant to Synthroid at home. -Continue Synthroid at current dose. Recent Labs    08/28/20 1009  TSH 20.018*   Hypokalemia/hypomagnesemia -Improving, but potassium low still remains low, 2.6 this morning.  IV and oral replacement ordered. -Magnesium level low at 1.5.  IV replacement ordered. Recent Labs  Lab 08/28/20 0654 08/28/20 1009 08/29/20 0715 08/30/20 0745 08/31/20 0807  K 4.4 <2.0* 2.1* 2.7* 2.6*  MG  --  2.2 2.0 1.8 1.5*   Hypophosphatemia -Very low phosphorus level of 0.7.  IV and oral replacement ordered.  Repeat tomorrow. Recent Labs  Lab  08/31/20 0807  PHOS 0.7*   Macrocytosis  Vitamin B12  deficiency -Vitamin B12 low at 187.  Not sure if patient was compliant to supplement at home.  Continue vitamin B12 supplement.  Liver cirrhosis without ascites -noted on CT abd/pelvis in past -Ammonia level normal  Chronic abdominal pain -Continue percocet   Hyperlipidemia -Statin  Essential hypertension -clonidine on hold secondary to soft blood pressure  Elevated troponin -Likely secondary to demand ischemia -EKG without concerning ischemic changes -Echocardiogram--EF 70-75%, mod LVH, no WMA  Mobility: PT eval Code Status:   Code Status: Full Code  Nutritional status: Body mass index is 29.46 kg/m.     Diet Order            Diet Carb Modified Fluid consistency: Thin; Room service appropriate? Yes  Diet effective now                 DVT prophylaxis: SCDs Start: 08/28/20 1842   Antimicrobials:  IV ciprofloxacin Fluid: Continue normal saline at 75 mill per hour Consultants: None Family Communication:  None at bedside  Status is: Inpatient  Remains inpatient appropriate because continues to diarrhea, severe electrolyte abnormality, need of IV antibiotics  Dispo: The patient is from: Home              Anticipated d/c is to: SNF likely              Anticipated d/c date is: 3 days              Patient currently is not medically stable to d/c.  Infusions:  . 0.9 % NaCl with KCl 40 mEq / L 75 mL/hr at 08/30/20 1552  . ciprofloxacin 400 mg (08/31/20 1026)  . metronidazole 500 mg (08/31/20 0922)  . sodium phosphate  Dextrose 5% IVPB 30 mmol (08/31/20 1151)    Scheduled Meds: . folic acid  1 mg Intravenous Daily  . levothyroxine  75 mcg Intravenous Daily  . [START ON 09/01/2020] magnesium oxide  400 mg Oral Daily  . mouth rinse  15 mL Mouth Rinse BID  . nystatin   Topical TID  . oxyCODONE  20 mg Oral Q12H  . pantoprazole (PROTONIX) IV  40 mg Intravenous Q12H  . phosphorus  500 mg Oral Daily  . potassium chloride SA  40 mEq Oral Daily  . thiamine   100 mg Oral Daily  . vitamin B-12  250 mcg Oral Daily    Antimicrobials: Anti-infectives (From admission, onward)   Start     Dose/Rate Route Frequency Ordered Stop   08/30/20 1545  metroNIDAZOLE (FLAGYL) IVPB 500 mg        500 mg 100 mL/hr over 60 Minutes Intravenous Every 8 hours 08/30/20 1540     08/30/20 1030  ciprofloxacin (CIPRO) IVPB 400 mg        400 mg 200 mL/hr over 60 Minutes Intravenous Every 12 hours 08/30/20 1011        PRN meds: acetaminophen **OR** acetaminophen, loperamide, ondansetron **OR** ondansetron (ZOFRAN) IV, oxyCODONE-acetaminophen   Objective: Vitals:   08/30/20 2143 08/31/20 0530  BP: 90/60 99/69  Pulse: 77 70  Resp: 16 16  Temp: 98.9 F (37.2 C) 98.7 F (37.1 C)  SpO2: 99% 97%    Intake/Output Summary (Last 24 hours) at 08/31/2020 1323 Last data filed at 08/31/2020 1141 Gross per 24 hour  Intake 1611.72 ml  Output 650 ml  Net 961.72 ml   Filed Weights   08/28/20 1649  Weight: 80.3 kg  Weight change:  Body mass index is 29.46 kg/m.   Physical Exam: General exam: Appears calm and comfortable.  Profoundly weak Skin: No rashes, lesions or ulcers. HEENT: Atraumatic, normocephalic, supple neck, no obvious bleeding Lungs: Clear to auscultation bilaterally CVS: Regular rate and rhythm, no murmur GI/Abd soft, mild diffuse abdominal tenderness, nondistended, bowel sound present CNS: Alert, awake, oriented to place Psychiatry: Depressed look Extremities: Trace bilateral pedal edema  Data Review: I have personally reviewed the laboratory data and studies available.  Recent Labs  Lab 08/28/20 0630 08/28/20 0654 08/29/20 0715 08/30/20 0745 08/31/20 0807  WBC 8.4  --  7.8 7.3 4.9  NEUTROABS 6.4  --   --  4.8 3.4  HGB 17.6* 19.0* 14.1 14.0 12.1  HCT 50.4* 56.0* 41.6 43.2 37.9  MCV 103.5*  --  104.5* 108.5* 108.6*  PLT 157  --  123* 128* 85*   Recent Labs  Lab 08/28/20 0630 08/28/20 0630 08/28/20 0654 08/28/20 1009  08/29/20 0715 08/30/20 0745 08/31/20 0807  NA 135  --  135  --  139 144 136  K <2.0*   < > 4.4 <2.0* 2.1* 2.7* 2.6*  CL 87*  --  90*  --  103 111 108  CO2 29  --   --   --  25 22 20*  GLUCOSE 208*  --  211*  --  218* 170* 141*  BUN 19  --  26*  --  16 13 13   CREATININE 1.08*  --  1.00  --  0.73 0.75 0.76  CALCIUM 9.2  --   --   --  7.9* 7.9* 7.5*  MG  --   --   --  2.2 2.0 1.8 1.5*  PHOS  --   --   --   --   --   --  0.7*   < > = values in this interval not displayed.    F/u labs ordered  Signed, Terrilee Croak, MD [facility] 08/31/2020

## 2020-08-31 NOTE — NC FL2 (Signed)
[place] LEVEL OF CARE SCREENING TOOL     IDENTIFICATION  Patient Name: Deborah Russo Birthdate: 1961-11-25 Sex: female Admission Date (Current Location): 08/28/2020  [facility] and [place] Number:  [employer] and Address:  [facility] [address], [place]      Provider Number: [id number]  Attending Physician Name and Address:  Terrilee Croak, MD  Relative Name and Phone Number:  Gregary Signs [phone]    Current Level of Care: Hospital Recommended Level of Care: [place] Prior Approval Number: [id number]  Date Approved/Denied: 06/20/18 PASRR Number: [id number]  Discharge Plan: SNF    Current Diagnoses: Patient Active Problem List   Diagnosis Date Noted  . Acute metabolic encephalopathy 32/54/9826  . SDH (subdural hematoma) ([place])   . Hemiparesis of right dominant side as late effect of cerebral infarction ([place]) 02/06/2019  . Vitamin D deficiency 02/06/2019  . Mixed hyperlipidemia 02/06/2019  . Gastroesophageal reflux disease without esophagitis 02/06/2019  . B12 deficiency 02/06/2019  . Thrombocytopenia ([place]) 02/06/2019  . Adrenal insufficiency ([place]) 02/06/2019  . Morbid obesity ([place]) 02/06/2019  . Prediabetes 09/21/2018  . Cirrhosis of liver without ascites ([place])   . Polypharmacy   . DNR (do not resuscitate) discussion   . Current smoker 03/29/2017  . HTN (hypertension) 03/29/2017  . Aortic atherosclerosis ([place]) 09/29/2016  . Chronic migraine 09/29/2016  . H/O Clostridium difficile infection 07/13/2014  . Diarrhea 07/13/2014  . FTT (failure to thrive) in adult 07/13/2014  . Chronic abdominal pain   . Hypothyroidism 06/19/2014  . Hypokalemia 06/19/2014  . Chronic pain 06/19/2014  . Anemia 06/19/2014    Orientation RESPIRATION BLADDER Height & Weight     Self, Place  Normal Incontinent Weight: 177 lb 0.5 oz (80.3 kg) Height:  5' 5"  (165.1 cm)  BEHAVIORAL SYMPTOMS/MOOD NEUROLOGICAL BOWEL  NUTRITION STATUS      Incontinent Diet (Diet Carb Modified Fluid consistency: Thin; Room service appropriate? Yes)  AMBULATORY STATUS COMMUNICATION OF NEEDS Skin   Limited Assist Verbally Skin abrasions (Both legs and abdomen)                       Personal Care Assistance Level of Assistance  Bathing, Feeding, Dressing Bathing Assistance: Maximum assistance Feeding assistance: Maximum assistance Dressing Assistance: Limited assistance     Functional Limitations Info  Sight, Hearing, Speech Sight Info: Impaired Hearing Info: Adequate Speech Info: Adequate    SPECIAL CARE FACTORS FREQUENCY  PT (By licensed PT)     PT Frequency: 5x per week              Contractures Contractures Info: Not present    Additional Factors Info  Code Status, Allergies Code Status Info: Full Allergies Info: Penicillins, Sulfa Antibiotics Psychotropic Info: Valium   Isolation Precautions Info: Enteric     Current Medications (08/31/2020):  This is the current hospital active medication list Current Facility-Administered Medications  Medication Dose Route Frequency Provider Last Rate Last Admin  . 0.9 % NaCl with KCl 40 mEq / L  infusion   Intravenous Continuous Terrilee Croak, MD 75 mL/hr at 08/30/20 1552 Rate Change at 08/30/20 1552  . acetaminophen (TYLENOL) tablet 650 mg  650 mg Oral Q6H PRN Tat, Shanon Brow, MD   650 mg at 08/29/20 1023   Or  . acetaminophen (TYLENOL) suppository 650 mg  650 mg Rectal Q6H PRN Tat, David, MD      . ciprofloxacin (CIPRO) IVPB 400 mg  400  mg Intravenous Q12H Dahal, Marlowe Aschoff, MD 200 mL/hr at 08/31/20 1026 400 mg at 04/79/98 7215  . folic acid injection 1 mg  1 mg Intravenous Daily Tat, David, MD   1 mg at 08/31/20 0920  . levothyroxine (SYNTHROID, LEVOTHROID) injection 75 mcg  75 mcg Intravenous Daily Tat, Shanon Brow, MD   75 mcg at 08/31/20 0919  . loperamide (IMODIUM) capsule 2 mg  2 mg Oral Q6H PRN Terrilee Croak, MD   2 mg at 08/30/20 1935  . [START ON 09/01/2020]  magnesium oxide (MAG-OX) tablet 400 mg  400 mg Oral Daily Dahal, Binaya, MD      . MEDLINE mouth rinse  15 mL Mouth Rinse BID Tat, David, MD   15 mL at 08/31/20 8727  . metroNIDAZOLE (FLAGYL) IVPB 500 mg  500 mg Intravenous Q8H Dahal, Binaya, MD 100 mL/hr at 08/31/20 1515 500 mg at 08/31/20 1515  . nystatin (MYCOSTATIN/NYSTOP) topical powder   Topical TID Terrilee Croak, MD   Given at 08/31/20 1518  . ondansetron (ZOFRAN) tablet 4 mg  4 mg Oral Q6H PRN Tat, Shanon Brow, MD   4 mg at 08/30/20 1034   Or  . ondansetron (ZOFRAN) injection 4 mg  4 mg Intravenous Q6H PRN Orson Eva, MD   4 mg at 08/30/20 1743  . oxyCODONE (OXYCONTIN) 12 hr tablet 20 mg  20 mg Oral Q12H Dahal, Marlowe Aschoff, MD   20 mg at 08/31/20 1337  . oxyCODONE-acetaminophen (PERCOCET/ROXICET) 5-325 MG per tablet 1 tablet  1 tablet Oral Q8H PRN Dahal, Binaya, MD      . pantoprazole (PROTONIX) injection 40 mg  40 mg Intravenous Therisa Doyne, MD   40 mg at 08/31/20 0919  . phosphorus (K PHOS NEUTRAL) tablet 500 mg  500 mg Oral Daily Dahal, Binaya, MD   500 mg at 08/31/20 1028  . potassium chloride SA (KLOR-CON) CR tablet 40 mEq  40 mEq Oral Daily Tat, David, MD   40 mEq at 08/31/20 0920  . sodium phosphate 30 mmol in dextrose 5 % 250 mL infusion  30 mmol Intravenous Once Terrilee Croak, MD 43 mL/hr at 08/31/20 1151 30 mmol at 08/31/20 1151  . thiamine tablet 100 mg  100 mg Oral Daily Tat, David, MD   100 mg at 08/31/20 0919  . vitamin B-12 (CYANOCOBALAMIN) tablet 250 mcg  250 mcg Oral Daily Tat, David, MD   250 mcg at 08/31/20 0920     Discharge Medications: Please see discharge summary for a list of discharge medications.  Relevant Imaging Results:  Relevant Lab Results:   Additional Information Pt SSN [id number]  Natasha Bence, LCSW

## 2020-08-31 NOTE — Progress Notes (Signed)
CRITICAL VALUE ALERT  Critical Value: K 2.6  Date & Time Notied:  08/31/2020  1000  Provider Notified: Dr. Pietro Cassis  Orders Received/Actions taken: orders received

## 2020-09-01 LAB — BASIC METABOLIC PANEL
Anion gap: 9 (ref 5–15)
BUN: 9 mg/dL (ref 8–23)
CO2: 19 mmol/L — ABNORMAL LOW (ref 22–32)
Calcium: 7.1 mg/dL — ABNORMAL LOW (ref 8.9–10.3)
Chloride: 107 mmol/L (ref 98–111)
Creatinine, Ser: 0.73 mg/dL (ref 0.44–1.00)
GFR calc Af Amer: >60 mL/min (ref >60)
GFR calc non Af Amer: >60 mL/min (ref >60)
Glucose, Bld: 150 mg/dL — ABNORMAL HIGH (ref 70–99)
Potassium: 2.5 mmol/L — CL (ref 3.5–5.1)
Sodium: 135 mmol/L (ref 135–145)

## 2020-09-01 LAB — CBC WITH DIFFERENTIAL/PLATELET
Abs Immature Granulocytes: 0.04 10*3/uL (ref 0.00–0.07)
Basophils Absolute: 0 10*3/uL (ref 0.0–0.1)
Basophils Relative: 0 %
Eosinophils Absolute: 0.1 10*3/uL (ref 0.0–0.5)
Eosinophils Relative: 1 %
HCT: 35.9 % — ABNORMAL LOW (ref 36.0–46.0)
Hemoglobin: 11.6 g/dL — ABNORMAL LOW (ref 12.0–15.0)
Immature Granulocytes: 1 %
Lymphocytes Relative: 23 %
Lymphs Abs: 1.3 10*3/uL (ref 0.7–4.0)
MCH: 34.6 pg — ABNORMAL HIGH (ref 26.0–34.0)
MCHC: 32.3 g/dL (ref 30.0–36.0)
MCV: 107.2 fL — ABNORMAL HIGH (ref 80.0–100.0)
Monocytes Absolute: 0.4 10*3/uL (ref 0.1–1.0)
Monocytes Relative: 6 %
Neutro Abs: 3.9 10*3/uL (ref 1.7–7.7)
Neutrophils Relative %: 69 %
Platelets: 68 10*3/uL — ABNORMAL LOW (ref 150–400)
RBC: 3.35 MIL/uL — ABNORMAL LOW (ref 3.87–5.11)
RDW: 17.6 % — ABNORMAL HIGH (ref 11.5–15.5)
WBC: 5.7 10*3/uL (ref 4.0–10.5)
nRBC: 0 % (ref 0.0–0.2)

## 2020-09-01 LAB — PHOSPHORUS: Phosphorus: 1.6 mg/dL — ABNORMAL LOW (ref 2.5–4.6)

## 2020-09-01 LAB — MAGNESIUM: Magnesium: 1.8 mg/dL (ref 1.7–2.4)

## 2020-09-01 MED ORDER — LORATADINE 10 MG PO TABS
10.0000 mg | ORAL_TABLET | Freq: Every day | ORAL | Status: DC
  Administered 2020-09-01: 10 mg via ORAL
  Filled 2020-09-01: qty 1

## 2020-09-01 MED ORDER — SALINE SPRAY 0.65 % NA SOLN
1.0000 | NASAL | Status: DC | PRN
  Administered 2020-09-02: 1 via NASAL
  Filled 2020-09-01 (×2): qty 44

## 2020-09-01 MED ORDER — POTASSIUM CHLORIDE CRYS ER 20 MEQ PO TBCR
40.0000 meq | EXTENDED_RELEASE_TABLET | Freq: Once | ORAL | Status: AC
  Administered 2020-09-01: 40 meq via ORAL
  Filled 2020-09-01: qty 2

## 2020-09-01 MED ORDER — SODIUM PHOSPHATES 45 MMOLE/15ML IV SOLN
30.0000 mmol | Freq: Once | INTRAVENOUS | Status: AC
  Administered 2020-09-01: 30 mmol via INTRAVENOUS
  Filled 2020-09-01: qty 10

## 2020-09-01 NOTE — Progress Notes (Signed)
PROGRESS NOTE  Deborah Russo  DOB: 04-02-1961  PCP: Baruch Gouty, FNP DXI:[id number]  DOA: 08/28/2020  LOS: 4 days   Chief Complaint  Patient presents with  . Altered Mental Status    Brief narrative: Deborah Russo is a 59 y.o. female with PMH of HTN, HLD, prediabetes, stroke, hypothyroidism, vitamin B12 deficiency, thrombocytopenia, GERD, chronic migraine, chronic pain, chronic nausea, vomiting, abdominal pain. Patient presented to the ED on 08/28/2020 with altered mental status.  Caregiver found her confused, lying in her urine and feces. EMS brought her to ED.    In the ED, patient had a low-grade temperature of 99.4, initially hypotensive with systolic blood pressure in 70s, blood pressure improved with IV fluids to 90s. Labs showed potassium level less than 2, WBC normal.  Lactic acid level is elevated to 2.3.  CT of the brain showed a very small subdural hematoma on the left lateral convexity.  Chest x-ray showed mild increased interstitial markings without consolidation.  Urinalysis was negative for pyuria.   Patient was admitted to hospitalist service for further evaluation management. CT abdomen and pelvis obtained subsequently showed an extensive colonic wall thickening throughout, most conspicuous in the transverse colon, and there is fluid in the distal colon and rectum. The terminal ileum may also be involved. Findings are consistent with nonspecific infectious, inflammatory, or ischemic colitis, differential considerations generally including inflammatory bowel disease given distribution and recurrent nature.  Subjective: Patient was seen and examined this morning. Depressed look. Remains weak. Continues to complain of abdominal pain. Has fecal bag with liquidy stool. Charted 900 mL of liquid stool output in last 24 hours. Labs from this morning continue to lower electrolytes level.  Assessment/Plan: Severe sepsis - poa -secondary to acute  gastroenteritis -Brought in with diarrhea, dehydration, lethargy, confusion -Initial blood pressure was low in 70s resulting in elevated lactic acid 2.3. -Blood cultures negative to date. -With IV antibiotics, fluid resuscitation, sepsis parameters and lactic acid level have improved. Recent Labs  Lab 08/28/20 0630 08/28/20 1009 08/29/20 0715 08/30/20 0745 08/31/20 0807 09/01/20 0719  WBC 8.4  --  7.8 7.3 4.9 5.7  LATICACIDVEN 2.3*  --   --  1.2  --   --   PROCALCITON  --  <0.10  --   --   --   --    Acute gastroenteritis -CT scan of abdomen pelvis as above showed extensive colonic wall thickening suggestive of colitis. -GI pathogen panel and C. difficile negative. -Currently on IV ciprofloxacin and IV Flagyl. -However patient continues to have significant amount of liquidy stool. Patient now has fecal bag inserted. 900 mL of liquid stool output in last 24 hours.  Hypokalemia/hypomagnesemia/hypophosphatemia -Electrolyte levels low significant loss from diarrhea. -Being monitored and replaced daily. Recent Labs  Lab 08/28/20 1009 08/29/20 0715 08/30/20 0745 08/31/20 0807 09/01/20 0719  K <2.0* 2.1* 2.7* 2.6* 2.5*  MG 2.2 2.0 1.8 1.5* 1.8  PHOS  --   --   --  0.7* 1.6*   Acute metabolic encephalopathy -Multifactorial including sepsis, dehydration, electrolyte derangements, hypothyroidism, subdural hematoma, elevated TSH and benzodiazepine in urine. -Mental status gradually improving but not back to baseline. Subdural hematoma -Previous hospitalist spoke with neurosurgery, Dr. Merlyn Albert conservative/nonoperative management -Recommended to hold aspirin for a week  Impaired mobility, generalized weakness -Care dependent at home. -PT eval recommended SNF.  Hypothyroidism -Significantly elevated TSH to 20. -Unclear if she was compliant to Synthroid at home. -Continue Synthroid at current dose. Recent Labs  08/28/20 1009  TSH 20.018*   Macrocytosis  Vitamin  B12 deficiency -Vitamin B12 low at 187.  Not sure if patient was compliant to supplement at home.  Continue vitamin B12 supplement.  Liver cirrhosis without ascites -noted on CT abd/pelvis in past -Ammonia level only slightly elevated  Chronic abdominal pain -Continue percocet   Hyperlipidemia -Statin  Essential hypertension -clonidine on hold secondary to soft blood pressure  Elevated troponin -Likely secondary to demand ischemia -EKG without concerning ischemic changes -Echocardiogram--EF 70-75%, mod LVH, no WMA  Mobility: PT eval Code Status:   Code Status: Full Code  Nutritional status: Body mass index is 29.46 kg/m.     Diet Order            Diet Carb Modified Fluid consistency: Thin; Room service appropriate? Yes  Diet effective now                 DVT prophylaxis: SCDs Start: 08/28/20 1842   Antimicrobials:  IV ciprofloxacin, IV Flagyl Fluid: Continue normal saline at 75 mill per hour Consultants: None Family Communication:  None at bedside  Status is: Inpatient  Remains inpatient appropriate because continues to diarrhea, severe electrolyte abnormality, need of IV antibiotics  Dispo: The patient is from: Home              Anticipated d/c is to: SNF likely              Anticipated d/c date is: More than 3 days              Patient currently is not medically stable to d/c.  Infusions:  . 0.9 % NaCl with KCl 40 mEq / L 75 mL/hr at 08/30/20 1552  . ciprofloxacin 400 mg (09/01/20 0923)  . metronidazole 500 mg (09/01/20 0545)  . sodium phosphate  Dextrose 5% IVPB      Scheduled Meds: . folic acid  1 mg Intravenous Daily  . levothyroxine  75 mcg Intravenous Daily  . magnesium oxide  400 mg Oral Daily  . mouth rinse  15 mL Mouth Rinse BID  . nystatin   Topical TID  . oxyCODONE  20 mg Oral Q12H  . pantoprazole (PROTONIX) IV  40 mg Intravenous Q12H  . phosphorus  500 mg Oral Daily  . potassium chloride SA  40 mEq Oral Daily  . thiamine   100 mg Oral Daily  . vitamin B-12  250 mcg Oral Daily    Antimicrobials: Anti-infectives (From admission, onward)   Start     Dose/Rate Route Frequency Ordered Stop   08/30/20 1545  metroNIDAZOLE (FLAGYL) IVPB 500 mg        500 mg 100 mL/hr over 60 Minutes Intravenous Every 8 hours 08/30/20 1540     08/30/20 1030  ciprofloxacin (CIPRO) IVPB 400 mg        400 mg 200 mL/hr over 60 Minutes Intravenous Every 12 hours 08/30/20 1011        PRN meds: acetaminophen **OR** acetaminophen, loperamide, ondansetron **OR** ondansetron (ZOFRAN) IV, oxyCODONE-acetaminophen, sodium chloride   Objective: Vitals:   08/31/20 2224 09/01/20 0452  BP: 122/64 104/61  Pulse: 71 (!) 59  Resp: 17 16  Temp: 98.3 F (36.8 C) 98 F (36.7 C)  SpO2: 97% 95%    Intake/Output Summary (Last 24 hours) at 09/01/2020 1127 Last data filed at 08/31/2020 2145 Gross per 24 hour  Intake 1432.51 ml  Output 900 ml  Net 532.51 ml   Filed Weights   08/28/20 1649  Weight: 80.3 kg   Weight change:  Body mass index is 29.46 kg/m.   Physical Exam: General exam: Appears calm and comfortable. Remains profoundly weak Skin: No rashes, lesions or ulcers. HEENT: Atraumatic, normocephalic, supple neck, no obvious bleeding Lungs: Clear to auscultation bilaterally CVS: Regular rate and rhythm, no murmur GI/Abd soft, continues to have mild diffuse abdominal tenderness, nondistended, bowel sound present CNS: Alert, awake, oriented to place Psychiatry: Depressed look Extremities: Trace bilateral pedal edema  Data Review: I have personally reviewed the laboratory data and studies available.  Recent Labs  Lab 08/28/20 0630 08/28/20 0630 08/28/20 0654 08/29/20 0715 08/30/20 0745 08/31/20 0807 09/01/20 0719  WBC 8.4  --   --  7.8 7.3 4.9 5.7  NEUTROABS 6.4  --   --   --  4.8 3.4 3.9  HGB 17.6*   < > 19.0* 14.1 14.0 12.1 11.6*  HCT 50.4*   < > 56.0* 41.6 43.2 37.9 35.9*  MCV 103.5*  --   --  104.5* 108.5* 108.6*  107.2*  PLT 157  --   --  123* 128* 85* 68*   < > = values in this interval not displayed.   Recent Labs  Lab 08/28/20 0630 08/28/20 0630 08/28/20 0654 08/28/20 0654 08/28/20 1009 08/29/20 0715 08/30/20 0745 08/31/20 0807 09/01/20 0719  NA 135   < > 135  --   --  139 144 136 135  K <2.0*   < > 4.4   < > <2.0* 2.1* 2.7* 2.6* 2.5*  CL 87*   < > 90*  --   --  103 111 108 107  CO2 29  --   --   --   --  25 22 20* 19*  GLUCOSE 208*   < > 211*  --   --  218* 170* 141* 150*  BUN 19   < > 26*  --   --  16 13 13 9   CREATININE 1.08*   < > 1.00  --   --  0.73 0.75 0.76 0.73  CALCIUM 9.2  --   --   --   --  7.9* 7.9* 7.5* 7.1*  MG  --   --   --   --  2.2 2.0 1.8 1.5* 1.8  PHOS  --   --   --   --   --   --   --  0.7* 1.6*   < > = values in this interval not displayed.    F/u labs ordered  Signed, Terrilee Croak, MD [facility] 09/01/2020

## 2020-09-01 NOTE — TOC Progression Note (Signed)
Transition of Care [facility]) - Progression Note    Patient Details  Name: Deborah Russo MRN: [id number] Date of Birth: 07/22/1961  Transition of Care [facility]) CM/SW Contact  Salome Arnt, [place] Phone Number: 09/01/2020, 12:29 PM  Clinical Narrative: LCSW called Melissa at [facility] and requested admissions to review referral. TOC will continue to follow.       Expected Discharge Plan: [place] Barriers to Discharge: Continued Medical Work up  Expected Discharge Plan and Services Expected Discharge Plan: [place] arrangements for the past 2 months: Single Family Home                                       Social Determinants of Health (SDOH) Interventions    Readmission Risk Interventions Readmission Risk Prevention Plan 08/31/2020  Transportation Screening Complete  PCP or Specialist Appt within 5-7 Days Complete  Home Care Screening Complete  Medication Review (RN CM) Complete  Some recent data might be hidden

## 2020-09-02 DIAGNOSIS — R188 Other ascites: Secondary | ICD-10-CM

## 2020-09-02 LAB — CULTURE, BLOOD (ROUTINE X 2)
Culture: NO GROWTH
Culture: NO GROWTH
Special Requests: ADEQUATE
Special Requests: ADEQUATE

## 2020-09-02 LAB — CBC WITH DIFFERENTIAL/PLATELET
Abs Immature Granulocytes: 0.05 10*3/uL (ref 0.00–0.07)
Basophils Absolute: 0 10*3/uL (ref 0.0–0.1)
Basophils Relative: 0 %
Eosinophils Absolute: 0.1 10*3/uL (ref 0.0–0.5)
Eosinophils Relative: 2 %
HCT: 35.3 % — ABNORMAL LOW (ref 36.0–46.0)
Hemoglobin: 11.8 g/dL — ABNORMAL LOW (ref 12.0–15.0)
Immature Granulocytes: 1 %
Lymphocytes Relative: 29 %
Lymphs Abs: 1.6 10*3/uL (ref 0.7–4.0)
MCH: 35.8 pg — ABNORMAL HIGH (ref 26.0–34.0)
MCHC: 33.4 g/dL (ref 30.0–36.0)
MCV: 107 fL — ABNORMAL HIGH (ref 80.0–100.0)
Monocytes Absolute: 0.3 10*3/uL (ref 0.1–1.0)
Monocytes Relative: 5 %
Neutro Abs: 3.5 10*3/uL (ref 1.7–7.7)
Neutrophils Relative %: 63 %
Platelets: 62 10*3/uL — ABNORMAL LOW (ref 150–400)
RBC: 3.3 MIL/uL — ABNORMAL LOW (ref 3.87–5.11)
RDW: 17.5 % — ABNORMAL HIGH (ref 11.5–15.5)
WBC: 5.4 10*3/uL (ref 4.0–10.5)
nRBC: 0 % (ref 0.0–0.2)

## 2020-09-02 LAB — MAGNESIUM: Magnesium: 1.7 mg/dL (ref 1.7–2.4)

## 2020-09-02 LAB — BASIC METABOLIC PANEL
Anion gap: 5 (ref 5–15)
BUN: 8 mg/dL (ref 8–23)
CO2: 20 mmol/L — ABNORMAL LOW (ref 22–32)
Calcium: 7.2 mg/dL — ABNORMAL LOW (ref 8.9–10.3)
Chloride: 111 mmol/L (ref 98–111)
Creatinine, Ser: 0.74 mg/dL (ref 0.44–1.00)
GFR calc Af Amer: >60 mL/min (ref >60)
GFR calc non Af Amer: >60 mL/min (ref >60)
Glucose, Bld: 137 mg/dL — ABNORMAL HIGH (ref 70–99)
Potassium: 2.8 mmol/L — ABNORMAL LOW (ref 3.5–5.1)
Sodium: 136 mmol/L (ref 135–145)

## 2020-09-02 LAB — FERRITIN: Ferritin: 112 ng/mL (ref 11–307)

## 2020-09-02 LAB — PHOSPHORUS: Phosphorus: 1.8 mg/dL — ABNORMAL LOW (ref 2.5–4.6)

## 2020-09-02 MED ORDER — POTASSIUM CHLORIDE 10 MEQ/100ML IV SOLN
10.0000 meq | INTRAVENOUS | Status: AC
  Administered 2020-09-02: 10 meq via INTRAVENOUS

## 2020-09-02 MED ORDER — MAGNESIUM SULFATE 2 GM/50ML IV SOLN
2.0000 g | Freq: Once | INTRAVENOUS | Status: AC
  Administered 2020-09-02: 2 g via INTRAVENOUS
  Filled 2020-09-02: qty 50

## 2020-09-02 MED ORDER — POTASSIUM CHLORIDE 10 MEQ/100ML IV SOLN
INTRAVENOUS | Status: AC
  Administered 2020-09-02: 10 meq via INTRAVENOUS
  Filled 2020-09-02: qty 100

## 2020-09-02 MED ORDER — SODIUM CHLORIDE 0.9 % IV SOLN
2.0000 g | INTRAVENOUS | Status: DC
  Administered 2020-09-02 – 2020-09-08 (×7): 2 g via INTRAVENOUS
  Filled 2020-09-02 (×7): qty 20

## 2020-09-02 MED ORDER — LORATADINE 10 MG PO TABS
10.0000 mg | ORAL_TABLET | Freq: Every day | ORAL | Status: DC | PRN
  Administered 2020-09-02: 10 mg via ORAL
  Filled 2020-09-02: qty 1

## 2020-09-02 MED ORDER — ZINC OXIDE 40 % EX OINT
TOPICAL_OINTMENT | CUTANEOUS | Status: DC | PRN
  Administered 2020-09-09: 1 via TOPICAL
  Filled 2020-09-02 (×2): qty 57

## 2020-09-02 MED ORDER — POTASSIUM PHOSPHATES 15 MMOLE/5ML IV SOLN
30.0000 mmol | Freq: Once | INTRAVENOUS | Status: AC
  Administered 2020-09-02: 30 mmol via INTRAVENOUS
  Filled 2020-09-02: qty 10

## 2020-09-02 MED ORDER — POTASSIUM CHLORIDE CRYS ER 20 MEQ PO TBCR
40.0000 meq | EXTENDED_RELEASE_TABLET | Freq: Two times a day (BID) | ORAL | Status: DC
  Administered 2020-09-02: 40 meq via ORAL
  Filled 2020-09-02: qty 2

## 2020-09-02 MED ORDER — CHLORHEXIDINE GLUCONATE CLOTH 2 % EX PADS
6.0000 | MEDICATED_PAD | Freq: Every day | CUTANEOUS | Status: DC
  Administered 2020-09-02 – 2020-09-09 (×8): 6 via TOPICAL

## 2020-09-02 NOTE — TOC Progression Note (Signed)
Transition of Care [facility]) - Progression Note    Patient Details  Name: Deborah Russo MRN: [id number] Date of Birth: 1961-09-06  Transition of Care [facility]) CM/SW Contact  Salome Arnt, [place] Phone Number: 09/02/2020, 9:35 AM  Clinical Narrative:  LCSW provided bed offers to pt's son, Darnell Level who accepts bed at [facility] Per [place], they do not manage pt. LCSW notified [place] and will keep facility updated to start authorization when appropriate.      Expected Discharge Plan: [place] Barriers to Discharge: Continued Medical Work up  Expected Discharge Plan and Services Expected Discharge Plan: [place] arrangements for the past 2 months: Single Family Home                                       Social Determinants of Health (SDOH) Interventions    Readmission Risk Interventions Readmission Risk Prevention Plan 08/31/2020  Transportation Screening Complete  PCP or Specialist Appt within 5-7 Days Complete  Home Care Screening Complete  Medication Review (RN CM) Complete  Some recent data might be hidden

## 2020-09-02 NOTE — Progress Notes (Signed)
Physical Therapy Treatment Patient Details Name: Deborah Russo MRN: [id number] DOB: Feb 05, 1961 Today's Date: 09/02/2020    History of Present Illness 59 year old female with a history of stroke, chronic nausea and vomiting and abdominal pain, hypothyroidism, low B12, thrombocytopenia, hyperlipidemia, hypertension, GERD, prediabetes presenting with altered mental status.  Unfortunately, the patient is unable to provide any significant history secondary to her altered mental status.  Attempts were made to contact the patient's family without any answer.  Apparently, the patient lives with caregivers that assist with her activities of daily living.  Apparently, one of the caregivers was gone for the weekend, and the other caregiver came by to check up on the patient and found the patient lying in her urine and feces.  She was confused.  EMS was activated.  The patient was awake and alert, but slow to respond and confused during my examination.  Review of systems was extremely limited.  She did deny any headache, visual disturbance, chest pain, shortness breath, abdominal pain.    PT Comments    Pt with rectal tube placement upon arrival, pt tearful reporting her bottom hurts and wanting to be home with family. Pt agreeable to supine therapeutic exercises, cues for motor control and encouragement to continue reps. Pt motivated to sit and stand with therapy, upon rolling to reposition lines, pt noted to be soiled. Pt able to roll into sidelying with min A and cues for use of bedrail and pushing through L heel to rotate hips. Pt with pain relief in R sidelying position and requests to remain in sidelying and not perform STS or attempt ambulation due to pain relief position. RN and nurse tech notified of pt soiled and in R sidelying position and both verbalized understanding. Session overall limited by pain. Patient will benefit from continued physical therapy in hospital and recommendations below to  increase strength, balance, endurance for safe ADLs and gait.    Follow Up Recommendations  SNF     Equipment Recommendations  None recommended by PT    Recommendations for Other Services       Precautions / Restrictions Precautions Precautions: Fall Precaution Comments: rectal tube Restrictions Weight Bearing Restrictions: No    Mobility  Bed Mobility  Bed Mobility: Rolling Rolling: Min assist  General bed mobility comments: min A to roll into R sidelying position for comfort, cues to use handrail and push through L heel to assist in rotating hips into sidelying  Transfers  General transfer comment: not attempted  Ambulation/Gait  General Gait Details: no attempted   Stairs             Wheelchair Mobility    Modified Rankin (Stroke Patients Only)       Balance                 Cognition Arousal/Alertness:  (initially lethargic, improved to alert with time) Behavior During Therapy: Flat affect Overall Cognitive Status: No family/caregiver present to determine baseline cognitive functioning  General Comments: pt tearful at times reporting "I want to go home" and "I have no family", pt easy to redirect and encourage with therapy      Exercises General Exercises - Lower Extremity Ankle Circles/Pumps: AROM;Strengthening;Both;15 reps;Supine Short Arc Quad: AROM;Strengthening;Both;10 reps;Supine Heel Slides: AROM;Strengthening;Both;5 reps;Supine    General Comments        Pertinent Vitals/Pain Pain Assessment: Faces Faces Pain Scale: Hurts whole lot Pain Location: bottom Pain Descriptors / Indicators: Discomfort;Guarding;Aching;Sore Pain Intervention(s): Limited activity within patient's tolerance;Monitored during session;Repositioned  Home Living                      Prior Function            PT Goals (current goals can now be found in the care plan section) Acute Rehab PT Goals Patient Stated Goal: decrease stomach pain PT  Goal Formulation: With patient Time For Goal Achievement: 09/12/20 Progress towards PT goals: Progressing toward goals    Frequency    Min 3X/week      PT Plan Current plan remains appropriate    Co-evaluation              AM-PAC PT "6 Clicks" Mobility   Outcome Measure  Help needed turning from your back to your side while in a flat bed without using bedrails?: A Lot Help needed moving from lying on your back to sitting on the side of a flat bed without using bedrails?: A Lot Help needed moving to and from a bed to a chair (including a wheelchair)?: Total Help needed standing up from a chair using your arms (e.g., wheelchair or bedside chair)?: Total Help needed to walk in hospital room?: Total Help needed climbing 3-5 steps with a railing? : Total 6 Click Score: 8    End of Session   Activity Tolerance: Patient limited by pain Patient left: in bed;with call bell/phone within reach Nurse Communication: Mobility status PT Visit Diagnosis: Unsteadiness on feet (R26.81);Other abnormalities of gait and mobility (R26.89);Muscle weakness (generalized) (M62.81)     Time: 1000-1020 PT Time Calculation (min) (ACUTE ONLY): 20 min  Charges:  $Therapeutic Exercise: 8-22 mins                      Talbot Grumbling PT, DPT 09/02/20, 12:24 PM [phone]

## 2020-09-02 NOTE — Progress Notes (Signed)
PROGRESS NOTE  Deborah Russo  DOB: 1961-01-27  PCP: Baruch Gouty, FNP UEK:[id number]  DOA: 08/28/2020  LOS: 5 days   Chief Complaint  Patient presents with  . Altered Mental Status    Brief narrative: Deborah Russo is a 59 y.o. female with PMH of HTN, HLD, prediabetes, stroke, hypothyroidism, vitamin B12 deficiency, thrombocytopenia, GERD, chronic migraine, chronic pain, chronic nausea, vomiting, abdominal pain. Patient presented to the ED on 08/28/2020 with altered mental status.  Caregiver found her confused, lying in her urine and feces. EMS brought her to ED.    In the ED, patient had a low-grade temperature of 99.4, initially hypotensive with systolic blood pressure in 70s, blood pressure improved with IV fluids to 90s. Labs showed potassium level less than 2, WBC normal.  Lactic acid level is elevated to 2.3.  CT of the brain showed a very small subdural hematoma on the left lateral convexity.  Chest x-ray showed mild increased interstitial markings without consolidation.  Urinalysis was negative for pyuria.   Patient was admitted to hospitalist service for further evaluation management. CT abdomen and pelvis obtained subsequently showed an extensive colonic wall thickening throughout, most conspicuous in the transverse colon, and there is fluid in the distal colon and rectum. The terminal ileum may also be involved. Findings are consistent with nonspecific infectious, inflammatory, or ischemic colitis, differential considerations generally including inflammatory bowel disease given distribution and recurrent nature.  Subjective: Patient was seen and examined this morning. Lying down in bed.  Continues to have abdominal pain.  Has a fecal bag with liquidy stool. Amount of liquidy stool charted last 24 hours is 500 mL Blood work from this morning continues to show low electrolytes level.  Assessment/Plan: Severe sepsis - poa -secondary to acute  gastroenteritis -Brought in with diarrhea, dehydration, lethargy, confusion -Initial blood pressure was low in 70s resulting in elevated lactic acid 2.3. -Blood cultures negative to date. -With IV antibiotics, fluid resuscitation, sepsis parameters and lactic acid level have improved. Recent Labs  Lab 08/28/20 0630 08/28/20 0630 08/28/20 1009 08/29/20 0715 08/30/20 0745 08/31/20 0807 09/01/20 0719 09/02/20 0537  WBC 8.4   < >  --  7.8 7.3 4.9 5.7 5.4  LATICACIDVEN 2.3*  --   --   --  1.2  --   --   --   PROCALCITON  --   --  <0.10  --   --   --   --   --    < > = values in this interval not displayed.   Acute gastroenteritis -CT scan of abdomen pelvis as above showed extensive colonic wall thickening suggestive of colitis. -GI pathogen panel and C. difficile negative. -Currently on IV ciprofloxacin and IV Flagyl.  Because of risk of QTC prolonged vision, switch from IV ciprofloxacin to IV Rocephin today. -900 mL of liquid stool output in last 24 hours. -GI consultation called today.  Hypokalemia/hypomagnesemia/hypophosphatemia -Electrolyte levels are low because of significant loss from diarrhea. -Patient is IV potassium rider on the fluid also getting twice a day oral potassium replacement and oral phosphorus replacement.  Hold oral magnesium replacement because of risk of diarrhea getting worse.  IV replacement ordered.  Repeat all labs tomorrow. Recent Labs  Lab 08/29/20 0715 08/30/20 0745 08/31/20 0807 09/01/20 0719 09/02/20 0537  K 2.1* 2.7* 2.6* 2.5* 2.8*  MG 2.0 1.8 1.5* 1.8 1.7  PHOS  --   --  0.7* 1.6* 1.8*   Acute metabolic encephalopathy -Multifactorial including sepsis, dehydration,  electrolyte derangements, hypothyroidism, subdural hematoma, elevated TSH and benzodiazepine in urine. -Mental status gradually improving but not back to baseline. Subdural hematoma -Previous hospitalist spoke with neurosurgery, Dr. Merlyn Albert conservative/nonoperative  management -Recommended to hold aspirin for a week  Impaired mobility, generalized weakness -Care dependent at home. -PT eval recommended SNF.  Hypothyroidism -Significantly elevated TSH to 20. -Unclear if she was compliant to Synthroid at home. -Continue Synthroid at current dose. Recent Labs    08/28/20 1009  TSH 20.018*   Macrocytosis  Vitamin B12 deficiency -Vitamin B12 low at 187, MCV elevated to 107. Not sure if patient was compliant to supplement at home.  Continue vitamin B12 supplement. Recent Labs    08/28/20 1009 08/29/20 0715 09/01/20 0719 09/02/20 0537  MCV  --    < > 107.2* 107.0*  VITAMINB12 187  --   --   --   FOLATE 5.3*  --   --   --    < > = values in this interval not displayed.   Liver cirrhosis without ascites -noted on CT abd/pelvis in past -Ammonia level only slightly elevated  Chronic abdominal pain -Continue percocet   Hyperlipidemia -Statin  Essential hypertension -clonidine on hold secondary to soft blood pressure  Elevated troponin -Likely secondary to demand ischemia -EKG without concerning ischemic changes -Echocardiogram--EF 70-75%, mod LVH, no WMA  Mobility: PT eval appreciated Code Status:   Code Status: Full Code  Nutritional status: Body mass index is 31.77 kg/m.     Diet Order            Diet Carb Modified Fluid consistency: Thin; Room service appropriate? Yes  Diet effective now                 DVT prophylaxis: SCDs Start: 08/28/20 1842   Antimicrobials:  IV ciprofloxacin, IV Flagyl Fluid: Continue normal saline at 75 mill per hour Consultants: None Family Communication:  None at bedside  Status is: Inpatient  Remains inpatient appropriate because continues to diarrhea, severe electrolyte abnormality, need of IV antibiotics  Dispo: The patient is from: Home              Anticipated d/c is to: SNF likely              Anticipated d/c date is: More than 3 days              Patient currently is  not medically stable to d/c.  Infusions:  . 0.9 % NaCl with KCl 40 mEq / L 75 mL/hr at 09/01/20 1602  . cefTRIAXone (ROCEPHIN)  IV    . magnesium sulfate bolus IVPB    . metronidazole 500 mg (09/02/20 0615)  . potassium PHOSPHATE IVPB (in mmol)      Scheduled Meds: . folic acid  1 mg Intravenous Daily  . levothyroxine  75 mcg Intravenous Daily  . mouth rinse  15 mL Mouth Rinse BID  . nystatin   Topical TID  . oxyCODONE  20 mg Oral Q12H  . pantoprazole (PROTONIX) IV  40 mg Intravenous Q12H  . phosphorus  500 mg Oral Daily  . potassium chloride SA  40 mEq Oral BID  . thiamine  100 mg Oral Daily  . vitamin B-12  250 mcg Oral Daily    Antimicrobials: Anti-infectives (From admission, onward)   Start     Dose/Rate Route Frequency Ordered Stop   09/02/20 0900  cefTRIAXone (ROCEPHIN) 2 g in sodium chloride 0.9 % 100 mL IVPB  2 g 200 mL/hr over 30 Minutes Intravenous Every 24 hours 09/02/20 0858     08/30/20 1545  metroNIDAZOLE (FLAGYL) IVPB 500 mg        500 mg 100 mL/hr over 60 Minutes Intravenous Every 8 hours 08/30/20 1540     08/30/20 1030  ciprofloxacin (CIPRO) IVPB 400 mg  Status:  Discontinued        400 mg 200 mL/hr over 60 Minutes Intravenous Every 12 hours 08/30/20 1011 09/02/20 0858      PRN meds: acetaminophen **OR** acetaminophen, loperamide, loratadine, ondansetron **OR** ondansetron (ZOFRAN) IV, oxyCODONE-acetaminophen, sodium chloride   Objective: Vitals:   09/01/20 2200 09/02/20 0458  BP: 130/73 121/78  Pulse: 78 76  Resp: 18 19  Temp: 98.3 F (36.8 C) 98 F (36.7 C)  SpO2: 97% 97%    Intake/Output Summary (Last 24 hours) at 09/02/2020 1119 Last data filed at 09/02/2020 0100 Gross per 24 hour  Intake 2006.75 ml  Output 500 ml  Net 1506.75 ml   Filed Weights   08/28/20 1649 09/02/20 0458  Weight: 80.3 kg 86.6 kg   Weight change:  Body mass index is 31.77 kg/m.   Physical Exam: General exam: Appears calm and comfortable.  Continues to  remain weak Skin: No rashes, lesions or ulcers. HEENT: Atraumatic, normocephalic, supple neck, no obvious bleeding Lungs: Clear to auscultation bilaterally CVS: Regular rate and rhythm, no murmur GI/Abd soft, continues to have mild diffuse abdominal tenderness, nondistended, bowel sound present. Has a fecal bag with brown liquid stool CNS: Alert, awake, oriented to place Psychiatry: Depressed look Extremities: Trace bilateral pedal edema  Data Review: I have personally reviewed the laboratory data and studies available.  Recent Labs  Lab 08/28/20 0630 08/28/20 0654 08/29/20 0715 08/30/20 0745 08/31/20 0807 09/01/20 0719 09/02/20 0537  WBC 8.4   < > 7.8 7.3 4.9 5.7 5.4  NEUTROABS 6.4  --   --  4.8 3.4 3.9 3.5  HGB 17.6*   < > 14.1 14.0 12.1 11.6* 11.8*  HCT 50.4*   < > 41.6 43.2 37.9 35.9* 35.3*  MCV 103.5*   < > 104.5* 108.5* 108.6* 107.2* 107.0*  PLT 157   < > 123* 128* 85* 68* 62*   < > = values in this interval not displayed.   Recent Labs  Lab 08/29/20 0715 08/30/20 0745 08/31/20 0807 09/01/20 0719 09/02/20 0537  NA 139 144 136 135 136  K 2.1* 2.7* 2.6* 2.5* 2.8*  CL 103 111 108 107 111  CO2 25 22 20* 19* 20*  GLUCOSE 218* 170* 141* 150* 137*  BUN 16 13 13 9 8   CREATININE 0.73 0.75 0.76 0.73 0.74  CALCIUM 7.9* 7.9* 7.5* 7.1* 7.2*  MG 2.0 1.8 1.5* 1.8 1.7  PHOS  --   --  0.7* 1.6* 1.8*    F/u labs ordered  Signed, Terrilee Croak, MD [facility] 09/02/2020

## 2020-09-02 NOTE — Consult Note (Addendum)
[name], M.D. Gastroenterology & Hepatology                                           Patient Name: Deborah Russo Account #: @FLAACCTNO @   MRN: [id number] Admission Date: 08/28/2020 Date of Evaluation:  09/02/2020 Time of Evaluation: 3:12 PM   Referring Physician: Terrilee Croak, MD  Chief Complaint:  Diarrhea, colitis  HPI:  This is a 59 y.o. female with history of chronic abdominal pain, hypothyroidism, hypertension, GERD, prediabetes, OSA, stroke, substance abuse, history of C. difficile colitis, chronic abdominal pain, who was brought to the hospital after being found with altered mental status covered in feces and urine.  The patient cannot recall exactly the sequence of events.  Per medical record, the patient has a caregiver that was gone for the weekend, and while a second caregiver came to check on her she was found to be altered lying in a pool of urine and feces.  EMS was called to bring her to the hospital.  The patient states that for the last week or so she has presented multiple episodes of watery bowel movements, at least 5-6 times per day without any blood or melena.  She states having chronic upper abdominal pain described as cramping in nature but denies having any fever or chills.  She has had some nausea but no vomiting episodes. I attempted reaching her listed contact Amerah Puleo (son) but he did not answer the phone, left voice message to call back.  The patient reported that she only has had one family member which was her daughter but she "passed away".  Notably, patient was hospitalized due to a similar presentation in June 2019.  She was found to have pancolitis at that time on CT scan.  The patient underwent a flexible sigmoidoscopy at that time that you showed presence of decreased vascularity but no active inflammation.  She had extensive testing which was negative for C. difficile, GI stool pathogen, carcinoid (24 hour 5 HIAA), celiac sprue or gastrinoma.   She received 5-day course of Flagyl.  The patient was discharged home on Questran twice daily, Imodium twice daily, Lomotil 3 times daily.  In the ED, she was found to have borderline blood pressure of 95/75 with normal heart rate of 72, she was afebrile. Labs were remarkable for severe hypokalemia of less than 2, hypochloremia 87, increased glucose 208, normal renal function, normal liver function tests, increased lactic acid 2.3, CBC with increased hemoglobin 17.6 normal platelets 157 and white blood cell count 8.4, negative HIV test.  CT head without IV contrast showed subdural hematoma in the left frontal convexity and remote infarcts in the parietal cortex and right caudate head.  CT of the abdomen and pelvis with IV contrast showed extensive colonic wall thickening which is worse in the transverse colon and possibly the terminal ileum, there was presence of small volume ascites.  Hepatic steatosis.  During her hospitalization she had a negative C. difficile testing in stool and GI pathogen was negative.  She has had aggressive repletion of her electrolytes but has remained hypokalemic and hypomagnesemic.  She was found to have borderline B12 and folate levels.  Blood cultures have been negative x2.  Urine tox was only positive for benzodiazepines.  Last EGD: 07/17/2014 -mild portal gastropathy and antral gastritis, no presence of esophageal or duodenal pathology. Last Colonoscopy: Per the  patient this was performed 20 years ago.  Last one report on file in 2003, which was normal., last flexible sigmoidoscopy in 06/16/2018 -found to have perianal rash, rectum tenderness found on digital exam, presence of congested and decreased vascular pattern in the rectum, sigmoid, and descending colon which was biopsied.  Biopsies were unremarkable.  Past Medical History: SEE CHRONIC ISSSUES: Past Medical History:  Diagnosis Date  . Chronic abdominal pain    since approximately 1991  . Chronic migraine   .  Chronic pain   . Hypothyroidism   . Nausea, vomiting, and diarrhea    recurrent, chronic  . Pain management   . Sleep apnea    has CPAP, but doesn't wear it bc she says "i can't sleep with it on"  . Stroke [facility])    Past Surgical History:  Past Surgical History:  Procedure Laterality Date  . ABDOMINAL HYSTERECTOMY    . ABDOMINAL SURGERY    . BIOPSY N/A 07/17/2014   Procedure: BIOPSY;  Surgeon: Rogene Houston, MD;  Location: [facility];  Service: Endoscopy;  Laterality: N/A;  . BIOPSY  06/16/2018   Procedure: BIOPSY;  Surgeon: [name], MD;  Location: [place];  Service: Endoscopy;;  . BREAST SURGERY Left    removed nipple  . CESAREAN SECTION    . CHOLECYSTECTOMY    . ESOPHAGOGASTRODUODENOSCOPY N/A 07/17/2014   Procedure: ESOPHAGOGASTRODUODENOSCOPY (EGD);  Surgeon: Rogene Houston, MD;  Location: [facility];  Service: Endoscopy;  Laterality: N/A;  . FLEXIBLE SIGMOIDOSCOPY N/A 06/16/2018   Procedure: FLEXIBLE SIGMOIDOSCOPY;  Surgeon: [name], MD;  [facility] ENDOSCOPY;  Service: Endoscopy;  Laterality: N/A;   Family History:  Family History  Problem Relation Age of Onset  . Asthma Mother   . Cancer Father    Social History:  Social History   Tobacco Use  . Smoking status: Current Every Day Smoker    Packs/day: 1.00    Years: 41.00    Pack years: 41.00    Types: Cigarettes  . Smokeless tobacco: Never Used  Vaping Use  . Vaping Use: Never used  Substance Use Topics  . Alcohol use: No  . Drug use: Yes    Frequency: 2.0 times per week    Types: Marijuana, Oxycodone    Home Medications:  Prior to Admission medications   Medication Sig Start Date End Date Taking? Authorizing Provider  cloNIDine (CATAPRES) 0.2 MG tablet Take 1 tablet (0.2 mg total) by mouth 2 (two) times daily. Needs to be seen for further refills. 05/20/20 08/28/20 Yes Stacks, Cletus Gash, MD  diazepam (VALIUM) 5 MG tablet Take 5 mg by mouth every 8 (eight) hours as needed for anxiety.   Yes [provider]  levothyroxine (SYNTHROID) 150 MCG tablet Take 1 tablet (150 mcg total) by mouth daily. (Needs to be seen before next refill) 02/11/20  Yes Rakes, Connye Burkitt, FNP  oxyCODONE (OXYCONTIN) 80 mg 12 hr tablet Take 1 tablet by mouth every 12 (twelve) hours. 08/09/20  Yes [provider]  oxyCODONE-acetaminophen (PERCOCET/ROXICET) 5-325 MG tablet Take 1 tablet by mouth every 8 (eight) hours as needed for severe pain. 06/23/18  Yes Sheikh, Omair Latif, DO  aspirin 325 MG tablet Take 1 tablet (325 mg total) by mouth daily. 02/06/19   Baruch Gouty, FNP  pravastatin (PRAVACHOL) 20 MG tablet Take 1 tablet (20 mg total) by mouth daily. (Needs to be seen before next refill) Patient not taking: Reported on 08/28/2020 02/11/20   Darla Lesches  M, FNP  SUMAtriptan (IMITREX) 50 MG tablet TAKE 1 TABLET AT ONSET OF HEADACHE, MAY REPEAT ONCE IN 2 HOURS. MAX 4PER DAY Patient not taking: TAKE 1 TABLET AT ONSET OF HEADACHE, MAY REPEAT ONCE IN 2 HOURS. MAX 4PER DAY 11/20/19   Baruch Gouty, FNP    Inpatient Medications:  Current Facility-Administered Medications:  .  0.9 % NaCl with KCl 40 mEq / L  infusion, , Intravenous, Continuous, Dahal, Binaya, MD, Last Rate: 100 mL/hr at 09/02/20 1335, Rate Change at 09/02/20 1335 .  acetaminophen (TYLENOL) tablet 650 mg, 650 mg, Oral, Q6H PRN, 650 mg at 08/29/20 1023 **OR** acetaminophen (TYLENOL) suppository 650 mg, 650 mg, Rectal, Q6H PRN, Tat, David, MD .  cefTRIAXone (ROCEPHIN) 2 g in sodium chloride 0.9 % 100 mL IVPB, 2 g, Intravenous, Q24H, Dahal, Binaya, MD .  Chlorhexidine Gluconate Cloth 2 % PADS 6 each, 6 each, Topical, Daily, Dahal, Binaya, MD, 6 each at 09/02/20 1354 .  folic acid injection 1 mg, 1 mg, Intravenous, Daily, Tat, David, MD, 1 mg at 09/02/20 0841 .  levothyroxine (SYNTHROID, LEVOTHROID) injection 75 mcg, 75 mcg, Intravenous, Daily, Tat, David, MD, 75 mcg at 09/02/20 0841 .  liver oil-zinc oxide (DESITIN) 40 % ointment, , Topical, PRN, Dahal,  Binaya, MD .  loperamide (IMODIUM) capsule 2 mg, 2 mg, Oral, Q6H PRN, Dahal, Binaya, MD, 2 mg at 09/02/20 0855 .  loratadine (CLARITIN) tablet 10 mg, 10 mg, Oral, Daily PRN, Dahal, Marlowe Aschoff, MD .  MEDLINE mouth rinse, 15 mL, Mouth Rinse, BID, Tat, David, MD, 15 mL at 09/02/20 0850 .  metroNIDAZOLE (FLAGYL) IVPB 500 mg, 500 mg, Intravenous, Q8H, Dahal, Binaya, MD, Last Rate: 100 mL/hr at 09/02/20 1441, 500 mg at 09/02/20 1441 .  ondansetron (ZOFRAN) tablet 4 mg, 4 mg, Oral, Q6H PRN, 4 mg at 08/30/20 1034 **OR** ondansetron (ZOFRAN) injection 4 mg, 4 mg, Intravenous, Q6H PRN, Tat, David, MD, 4 mg at 08/30/20 1743 .  oxyCODONE (OXYCONTIN) 12 hr tablet 20 mg, 20 mg, Oral, Q12H, Dahal, Binaya, MD, 20 mg at 09/02/20 1353 .  oxyCODONE-acetaminophen (PERCOCET/ROXICET) 5-325 MG per tablet 1 tablet, 1 tablet, Oral, Q8H PRN, Terrilee Croak, MD, 1 tablet at 09/02/20 0616 .  pantoprazole (PROTONIX) injection 40 mg, 40 mg, Intravenous, Q12H, Tat, Shanon Brow, MD, 40 mg at 09/02/20 0840 .  phosphorus (K PHOS NEUTRAL) tablet 500 mg, 500 mg, Oral, Daily, Dahal, Binaya, MD, 500 mg at 09/02/20 0840 .  potassium chloride 10 mEq in 100 mL IVPB, 10 mEq, Intravenous, Q1 Hr x 2, Dahal, Binaya, MD, Last Rate: 100 mL/hr at 09/02/20 1440, 10 mEq at 09/02/20 1440 .  potassium chloride SA (KLOR-CON) CR tablet 40 mEq, 40 mEq, Oral, BID, Dahal, Binaya, MD .  potassium PHOSPHATE 30 mmol in dextrose 5 % 500 mL infusion, 30 mmol, Intravenous, Once, Dahal, Binaya, MD, Last Rate: 85 mL/hr at 09/02/20 1340, 30 mmol at 09/02/20 1340 .  sodium chloride (OCEAN) 0.65 % nasal spray 1 spray, 1 spray, Each Nare, PRN, Dahal, Binaya, MD, 1 spray at 09/02/20 1353 .  thiamine tablet 100 mg, 100 mg, Oral, Daily, Tat, David, MD, 100 mg at 09/02/20 0850 .  vitamin B-12 (CYANOCOBALAMIN) tablet 250 mcg, 250 mcg, Oral, Daily, Tat, David, MD, 250 mcg at 09/02/20 3888 Allergies: Penicillins and Sulfa antibiotics  Complete Review of Systems: GENERAL: negative  for malaise, night sweats HEENT: No changes in hearing or vision, no nose bleeds or other nasal problems. NECK: Negative for lumps, goiter, pain and  significant neck swelling RESPIRATORY: Negative for cough, wheezing CARDIOVASCULAR: Negative for chest pain, leg swelling, palpitations, orthopnea GI: SEE HPI MUSCULOSKELETAL: Negative for joint pain or swelling, back pain, and muscle pain. SKIN: Negative for lesions, rash PSYCH: Negative for sleep disturbance, mood disorder and recent psychosocial stressors. HEMATOLOGY Negative for prolonged bleeding, bruising easily, and swollen nodes. ENDOCRINE: Negative for cold or heat intolerance, polyuria, polydipsia and goiter. NEURO: negative for tremor, gait imbalance, syncope and seizures. The remainder of the review of systems is noncontributory.  Physical Exam: BP 118/80   Pulse 78   Temp 98.5 F (36.9 C) (Oral)   Resp 20   Ht 5' 5"  (1.651 m)   Wt 86.6 kg   SpO2 100%   BMI 31.77 kg/m  GENERAL: The patient is AO x3 but is somnolent, in no acute distress. HEENT: Head is normocephalic and atraumatic. EOMI are intact. Mouth is well hydrated and without lesions. NECK: Supple. No masses LUNGS: Clear to auscultation. No presence of rhonchi/wheezing/rales. Adequate chest expansion HEART: RRR, normal s1 and s2. ABDOMEN: Mildly tender to palpation in the epigastric area but no guarding, no peritoneal signs, and nondistended. BS +. No masses. EXTREMITIES: Without any cyanosis, clubbing, rash, lesions or edema. NEUROLOGIC: AOx3, no focal motor deficit.  No asterixis. SKIN: no jaundice, no rashes  Laboratory Data CBC:     Component Value Date/Time   WBC 5.4 09/02/2020 0537   RBC 3.30 (L) 09/02/2020 0537   HGB 11.8 (L) 09/02/2020 0537   HGB 15.3 02/06/2019 1632   HCT 35.3 (L) 09/02/2020 0537   HCT 45.0 02/06/2019 1632   PLT 62 (L) 09/02/2020 0537   PLT 256 02/06/2019 1632   MCV 107.0 (H) 09/02/2020 0537   MCV 88 02/06/2019 1632   MCH 35.8  (H) 09/02/2020 0537   MCHC 33.4 09/02/2020 0537   RDW 17.5 (H) 09/02/2020 0537   RDW 14.9 02/06/2019 1632   LYMPHSABS 1.6 09/02/2020 0537   LYMPHSABS 3.0 02/06/2019 1632   MONOABS 0.3 09/02/2020 0537   EOSABS 0.1 09/02/2020 0537   EOSABS 0.1 02/06/2019 1632   BASOSABS 0.0 09/02/2020 0537   BASOSABS 0.1 02/06/2019 1632   COAG:  Lab Results  Component Value Date   INR 1.22 06/11/2018   INR 0.95 06/16/2017   INR 0.96 03/29/2017    BMP:  BMP Latest Ref Rng & Units 09/02/2020 09/01/2020 08/31/2020  Glucose 70 - 99 mg/dL 137(H) 150(H) 141(H)  BUN 8 - 23 mg/dL 8 9 13   Creatinine 0.44 - 1.00 mg/dL 0.74 0.73 0.76  BUN/Creat Ratio 9 - 23 - - -  Sodium 135 - 145 mmol/L 136 135 136  Potassium 3.5 - 5.1 mmol/L 2.8(L) 2.5(LL) 2.6(LL)  Chloride 98 - 111 mmol/L 111 107 108  CO2 22 - 32 mmol/L 20(L) 19(L) 20(L)  Calcium 8.9 - 10.3 mg/dL 7.2(L) 7.1(L) 7.5(L)    HEPATIC:  Hepatic Function Latest Ref Rng & Units 08/30/2020 08/29/2020 08/28/2020  Total Protein 6.5 - 8.1 g/dL 6.0(L) 6.2(L) -  Albumin 3.5 - 5.0 g/dL 2.9(L) 3.1(L) -  AST 15 - 41 U/L 16 18 -  ALT 0 - 44 U/L 16 17 -  Alk Phosphatase 38 - 126 U/L 69 73 -  Total Bilirubin 0.3 - 1.2 mg/dL 1.3(H) 1.8(H) 2.1(H)  Bilirubin, Direct 0.0 - 0.2 mg/dL - - 0.7(H)    CARDIAC:  Lab Results  Component Value Date   CKTOTAL 177 08/28/2020   TROPONINI 1.20 ([place]) 06/12/2018     Imaging: I personally  reviewed and interpreted the available imaging.  Assessment & Plan: Deborah Russo is a  59 y.o. female with history of chronic abdominal pain, hypothyroidism, hypertension, GERD, prediabetes, OSA, stroke, substance abuse, history of C. difficile colitis, chronic abdominal pain, who was admitted to the hospital after presenting altered mental status and persistent diarrhea.  The patient has presented major electrolyte abnormalities with presence of significant diarrhea throughout her hospital stay.  She had negative testing for infectious causes of  diarrhea.  However, she was noted since admission to have diffuse pancolitis, which has been seen in a previous hospitalization.  She underwent previously extensive investigation rule out carcinoid syndrome, gastrinoma and celiac disease.  At this time, it would be important to repeat that endoscopic evaluation of her pancolitis as she has presented significant electrolyte derangements.  We'll proceed with a flexible sigmoidoscopy for evaluation of this once her electrolytes have been corrected.  On the other hand, the patient has some abnormalities that raise a concern for liver cirrhosis as she is presenting new onset ascites, as well as hypoalbuminemia and thrombocytopenia.  Her pancolitis seen on CT scan could be related to hypoalbuminemia but will need to evaluate inflammatory causes first.  # Pancolitis # Chronic diarrhea # New onset ascites - NPO after midnight - Possible flex sig tomorrow - Aggressive electrolyte correction (hypokalemia, hypomagnesemia) - Will try to reach family members again tomorrow - Check hepatitis A/B/C serologies, iron panel, ANA, AMA, ASMA, IgG  Harvel Quale, MD Gastroenterology and Hepatology [facility]   Note: Occasional unusual wording and randomly placed punctuation marks may result from the use of speech recognition technology to transcribe this document

## 2020-09-03 ENCOUNTER — Inpatient Hospital Stay (HOSPITAL_COMMUNITY): Payer: Medicare HMO | Admitting: Anesthesiology

## 2020-09-03 ENCOUNTER — Other Ambulatory Visit: Payer: Self-pay

## 2020-09-03 ENCOUNTER — Encounter (HOSPITAL_COMMUNITY): Payer: Self-pay | Admitting: Internal Medicine

## 2020-09-03 ENCOUNTER — Encounter (HOSPITAL_COMMUNITY)
Admission: EM | Disposition: A | Discharge status: Skilled Nursing Facility | Payer: Self-pay | Source: Home / Self Care | Attending: Internal Medicine

## 2020-09-03 DIAGNOSIS — R197 Diarrhea, unspecified: Secondary | ICD-10-CM

## 2020-09-03 DIAGNOSIS — K6289 Other specified diseases of anus and rectum: Secondary | ICD-10-CM

## 2020-09-03 DIAGNOSIS — K7460 Unspecified cirrhosis of liver: Secondary | ICD-10-CM

## 2020-09-03 HISTORY — PX: COLONOSCOPY WITH PROPOFOL: SHX5780

## 2020-09-03 HISTORY — PX: BIOPSY: SHX5522

## 2020-09-03 LAB — CBC WITH DIFFERENTIAL/PLATELET
Abs Immature Granulocytes: 0.04 10*3/uL (ref 0.00–0.07)
Basophils Absolute: 0 10*3/uL (ref 0.0–0.1)
Basophils Relative: 1 %
Eosinophils Absolute: 0.1 10*3/uL (ref 0.0–0.5)
Eosinophils Relative: 2 %
HCT: 37.2 % (ref 36.0–46.0)
Hemoglobin: 12.1 g/dL (ref 12.0–15.0)
Immature Granulocytes: 1 %
Lymphocytes Relative: 34 %
Lymphs Abs: 1.6 10*3/uL (ref 0.7–4.0)
MCH: 34.6 pg — ABNORMAL HIGH (ref 26.0–34.0)
MCHC: 32.5 g/dL (ref 30.0–36.0)
MCV: 106.3 fL — ABNORMAL HIGH (ref 80.0–100.0)
Monocytes Absolute: 0.3 10*3/uL (ref 0.1–1.0)
Monocytes Relative: 6 %
Neutro Abs: 2.8 10*3/uL (ref 1.7–7.7)
Neutrophils Relative %: 56 %
Platelets: 58 10*3/uL — ABNORMAL LOW (ref 150–400)
RBC: 3.5 MIL/uL — ABNORMAL LOW (ref 3.87–5.11)
RDW: 17.4 % — ABNORMAL HIGH (ref 11.5–15.5)
WBC: 4.9 10*3/uL (ref 4.0–10.5)
nRBC: 0 % (ref 0.0–0.2)

## 2020-09-03 LAB — BASIC METABOLIC PANEL
Anion gap: 7 (ref 5–15)
BUN: 6 mg/dL — ABNORMAL LOW (ref 8–23)
CO2: 18 mmol/L — ABNORMAL LOW (ref 22–32)
Calcium: 7.5 mg/dL — ABNORMAL LOW (ref 8.9–10.3)
Chloride: 111 mmol/L (ref 98–111)
Creatinine, Ser: 0.61 mg/dL (ref 0.44–1.00)
GFR calc Af Amer: >60 mL/min (ref >60)
GFR calc non Af Amer: >60 mL/min (ref >60)
Glucose, Bld: 160 mg/dL — ABNORMAL HIGH (ref 70–99)
Potassium: 4 mmol/L (ref 3.5–5.1)
Sodium: 136 mmol/L (ref 135–145)

## 2020-09-03 LAB — HEPATITIS B SURFACE ANTIBODY,QUALITATIVE: Hep B S Ab: REACTIVE — AB

## 2020-09-03 LAB — HEPATITIS PANEL, ACUTE
HCV Ab: NONREACTIVE
Hep A IgM: NONREACTIVE
Hep B C IgM: NONREACTIVE
Hepatitis B Surface Ag: NONREACTIVE

## 2020-09-03 LAB — PROTIME-INR
INR: 1.1 (ref 0.8–1.2)
Prothrombin Time: 13.9 seconds (ref 11.4–15.2)

## 2020-09-03 LAB — MAGNESIUM: Magnesium: 2 mg/dL (ref 1.7–2.4)

## 2020-09-03 LAB — HEPATITIS B CORE ANTIBODY, TOTAL: Hep B Core Total Ab: REACTIVE — AB

## 2020-09-03 LAB — HEPATITIS A ANTIBODY, TOTAL: hep A Total Ab: NONREACTIVE

## 2020-09-03 LAB — PHOSPHORUS: Phosphorus: 1.8 mg/dL — ABNORMAL LOW (ref 2.5–4.6)

## 2020-09-03 SURGERY — COLONOSCOPY WITH PROPOFOL
Anesthesia: General

## 2020-09-03 MED ORDER — PROPOFOL 500 MG/50ML IV EMUL
INTRAVENOUS | Status: DC | PRN
  Administered 2020-09-03: 100 ug/kg/min via INTRAVENOUS

## 2020-09-03 MED ORDER — PHENYLEPHRINE HCL (PRESSORS) 10 MG/ML IV SOLN
INTRAVENOUS | Status: DC | PRN
  Administered 2020-09-03 (×2): 100 ug via INTRAVENOUS

## 2020-09-03 MED ORDER — COLESTIPOL HCL 1 G PO TABS
1.0000 g | ORAL_TABLET | Freq: Two times a day (BID) | ORAL | Status: DC
  Administered 2020-09-03 – 2020-09-09 (×13): 1 g via ORAL
  Filled 2020-09-03 (×15): qty 1

## 2020-09-03 MED ORDER — PROPOFOL 10 MG/ML IV BOLUS
INTRAVENOUS | Status: AC
  Filled 2020-09-03: qty 40

## 2020-09-03 MED ORDER — POTASSIUM CHLORIDE CRYS ER 20 MEQ PO TBCR
40.0000 meq | EXTENDED_RELEASE_TABLET | Freq: Every day | ORAL | Status: DC
  Administered 2020-09-04: 40 meq via ORAL
  Filled 2020-09-03: qty 2

## 2020-09-03 MED ORDER — STERILE WATER FOR IRRIGATION IR SOLN
Status: DC | PRN
  Administered 2020-09-03: 2.5 mL

## 2020-09-03 MED ORDER — K PHOS MONO-SOD PHOS DI & MONO 155-852-130 MG PO TABS
500.0000 mg | ORAL_TABLET | Freq: Two times a day (BID) | ORAL | Status: DC
  Administered 2020-09-03 – 2020-09-09 (×12): 500 mg via ORAL
  Filled 2020-09-03 (×13): qty 2

## 2020-09-03 MED ORDER — SODIUM CHLORIDE 0.9 % IV SOLN: INTRAVENOUS | Status: DC | PRN

## 2020-09-03 MED ORDER — PANTOPRAZOLE SODIUM 40 MG PO TBEC
40.0000 mg | DELAYED_RELEASE_TABLET | Freq: Every day | ORAL | Status: DC
  Administered 2020-09-04 – 2020-09-09 (×6): 40 mg via ORAL
  Filled 2020-09-03 (×6): qty 1

## 2020-09-03 MED ORDER — SODIUM CHLORIDE 0.9 % IV SOLN: INTRAVENOUS | Status: DC

## 2020-09-03 NOTE — Brief Op Note (Signed)
08/28/2020 - 09/03/2020  1:12 PM  PATIENT:  Deborah Russo  59 y.o. female  PRE-OPERATIVE DIAGNOSIS:  pancolitis  POST-OPERATIVE DIAGNOSIS:  6 AVM's in cecum; flattened folds in colon;flattened, villi; congested, colon;  PROCEDURE:  Procedure(s): COLONOSCOPY WITH PROPOFOL  SURGEON:  Surgeon(s) and Role:    * Harvel Quale, MD - Primary  Initially, the patient was scheduled to perform an flexible sigmoidoscopy but given the fact the patient had completely clean bowel, full colonoscopy was performed.  The patient was found to have extensive skin breakage in her perianal area.  Her terminal ileum looked atrophic and there was presence of 5 emboli, this was biopsied.  Her colon had presence of diffuse congestion characterized by significant edema with presence of focal areas of erythema.  Also, it was noticed that the colon lacked the usual haustrations seen in normal colon, he had a tubular appearance.  Multiple biopsies were taken from throughout the colon.  Also there was presence of thickened anorectal junction mucosa, which looked hyperkeratotic.  This was biopsied.  RECOMMENDATIONS: - Return patient to hospital ward for ongoing care.  - Resume previous diet.  - Await pathology results. - Start Imodium as needed and Colestid 1 g BID.  Marland Kitchen [name], MD Gastroenterology and Hepatology [facility]

## 2020-09-03 NOTE — Anesthesia Preprocedure Evaluation (Signed)
Anesthesia Evaluation  Patient identified by MRN, date of birth, ID band Patient awake and Patient confused    Reviewed: Allergy & Precautions, NPO status , Patient's Chart, lab work & pertinent test results  Airway Mallampati: III  TM Distance: >3 FB Neck ROM: Full    Dental  (+) Poor Dentition, Dental Advisory Given, Missing, Loose, Edentulous Upper,    Pulmonary sleep apnea and Continuous Positive Airway Pressure Ventilation , Current Smoker,    Pulmonary exam normal breath sounds clear to auscultation       Cardiovascular Exercise Tolerance: Poor hypertension, Pt. on medications Normal cardiovascular exam Rhythm:Regular Rate:Normal     Neuro/Psych  Headaches, CVA, Residual Symptoms    GI/Hepatic GERD  Medicated,(+)     substance abuse  marijuana use,   Endo/Other  Hypothyroidism   Renal/GU negative Renal ROS     Musculoskeletal  (+) narcotic dependentChronic pain   Abdominal   Peds  Hematology  (+) Blood dyscrasia (thrombocytopenia), anemia ,   Anesthesia Other Findings   Reproductive/Obstetrics negative OB ROS                             Anesthesia Physical Anesthesia Plan  ASA: IV  Anesthesia Plan: General   Post-op Pain Management:    Induction: Intravenous  PONV Risk Score and Plan: TIVA  Airway Management Planned: Nasal Cannula, Natural Airway and Simple Face Mask  Additional Equipment:   Intra-op Plan:   Post-operative Plan:   Informed Consent: I have reviewed the patients History and Physical, chart, labs and discussed the procedure including the risks, benefits and alternatives for the proposed anesthesia with the patient or authorized representative who has indicated his/her understanding and acceptance.     Dental advisory given  Plan Discussed with: CRNA and Surgeon  Anesthesia Plan Comments:         Anesthesia Quick Evaluation

## 2020-09-03 NOTE — Transfer of Care (Signed)
Immediate Anesthesia Transfer of Care Note  Patient: [name]  Procedure(s) Performed: COLONOSCOPY WITH PROPOFOL  Patient Location: PACU  Anesthesia Type:General  Level of Consciousness: awake, alert , oriented and patient cooperative  Airway & Oxygen Therapy: Patient Spontanous Breathing  Post-op Assessment: Report given to RN, Post -op Vital signs reviewed and stable and Patient moving all extremities X 4  Post vital signs: Reviewed and stable  Last Vitals:  Vitals Value Taken Time  BP    Temp    Pulse 93 09/03/20 1311  Resp 24 09/03/20 1311  SpO2      Last Pain:  Vitals:   09/03/20 1311  TempSrc: Oral  PainSc:       Patients Stated Pain Goal: 3 (07/57/32 2567)  Complications: No complications documented.

## 2020-09-03 NOTE — Anesthesia Postprocedure Evaluation (Signed)
Anesthesia Post Note  Patient: Deborah Russo  Procedure(s) Performed: COLONOSCOPY WITH PROPOFOL  Patient location during evaluation: Phase II Anesthesia Type: General Level of consciousness: awake, oriented and patient cooperative Pain management: satisfactory to patient Vital Signs Assessment: post-procedure vital signs reviewed and stable Respiratory status: spontaneous breathing, respiratory function stable and nonlabored ventilation Cardiovascular status: stable Postop Assessment: no apparent nausea or vomiting Anesthetic complications: no   No complications documented.   Last Vitals:  Vitals:   09/03/20 1158 09/03/20 1311  BP: 129/86   Pulse: 83 93  Resp: 18 (!) 24  Temp: 36.8 C   SpO2: 96%     Last Pain:  Vitals:   09/03/20 1311  TempSrc: Oral  PainSc:                  Willa Rough

## 2020-09-03 NOTE — Progress Notes (Signed)
GI Inpatient Follow-up Note  Patient Identification: Deborah Russo is a 59 y.o. female w/ PMHX  chronic abdominal pain, hypothyroidism, hypertension, GERD, prediabetes, OSA, stroke, substance abuse, history of C. difficile colitis, chronic abdominal pain. Admitted on 08/28/20 after being found lying in pool of urine and feces, found to have severe hypokalemia, CT as below.   Cdiff and GI pathogen panel negative. Hx of pancolitis on CT June 2019 as well, had flex sig and work up w/ carcinoid syndrome, gastrinoma, and celiac in 2019  Also found on CT was ascites w/ low albumin and platelets concerning for underlying liver disease.    Subjective: Patient continues to complain of very mild abdominal pain which is a chronic issue, she denies any worsening recently.  She is n.p.o. since midnight but did not have any vomiting yesterday.  Continues to have liquid brown stool in FMS (about 59m).  [facility] but is not able to give much specific symptom history. She denies alcohol use. States she smokes 1/2 PPD. She denies any recent weight loss. Unsure how long diarrhea was going on before admitted to hospital.   Scheduled Inpatient Medications:  . Chlorhexidine Gluconate Cloth  6 each Topical Daily  . folic acid  1 mg Intravenous Daily  . mouth rinse  15 mL Mouth Rinse BID  . oxyCODONE  20 mg Oral Q12H  . pantoprazole  40 mg Oral Daily  . phosphorus  500 mg Oral BID  . potassium chloride SA  40 mEq Oral Daily  . thiamine  100 mg Oral Daily  . vitamin B-12  250 mcg Oral Daily    Continuous Inpatient Infusions:   . sodium chloride    . cefTRIAXone (ROCEPHIN)  IV 2 g (09/02/20 1811)  . metronidazole 500 mg (09/03/20 0507)    PRN Inpatient Medications:  acetaminophen **OR** acetaminophen, liver oil-zinc oxide, loperamide, loratadine, ondansetron **OR** ondansetron (ZOFRAN) IV, oxyCODONE-acetaminophen,  sodium chloride  Review of Systems: Constitutional: Weight is stable.  Eyes: No changes in vision. ENT: No oral lesions, sore throat.  GI: see HPI.  Heme/Lymph: No easy bruising.  CV: No chest pain.  GU: No hematuria.  Integumentary: No rashes.  Neuro: No headaches.  Psych: No depression/anxiety.  Endocrine: No heat/cold intolerance.  Allergic/Immunologic: No urticaria.  Resp: No cough, SOB.  Musculoskeletal: No joint swelling.    Physical Examination: BP 100/80 (BP Location: Left Arm)   Pulse 81   Temp 97.7 F (36.5 C) (Oral)   Resp 18   Ht 5' 5"  (1.651 m)   Wt 86.6 kg   SpO2 99%   BMI 31.77 kg/m  Gen: NAD, alert and oriented x 4 HEENT: PEERLA, EOMI, Neck: supple, no JVD or thyromegaly Chest: CTA bilaterally, no wheezes, crackles, or other adventitious sounds CV: RRR, no m/g/c/r Abd: soft, fullness, mild TTP, ND, +BS in all four quadrants; no HSM, guarding, ridigity, or rebound tenderness Ext: no edema, well perfused with 2+ pulses, Skin: no rash or lesions noted Lymph: no LAD  Data: Lab Results  Component Value Date   WBC 4.9 09/03/2020   HGB 12.1 09/03/2020   HCT 37.2 09/03/2020   MCV 106.3 (H) 09/03/2020   PLT 58 (L) 09/03/2020   Recent Labs  Lab 09/01/20 0719 09/02/20 0537 09/03/20 0659  HGB 11.6* 11.8* 12.1   Lab Results  Component Value Date   NA 136 09/03/2020   K 4.0  09/03/2020   CL 111 09/03/2020   CO2 18 (L) 09/03/2020   BUN 6 (L) 09/03/2020   CREATININE 0.61 09/03/2020   Lab Results  Component Value Date   ALT 16 08/30/2020   AST 16 08/30/2020   ALKPHOS 69 08/30/2020   BILITOT 1.3 (H) 08/30/2020    Admission labs - albumin 4.0, Tbili 2.3, normal AST/ALT. Hgb 17.6, platelet 157. TSH 20.018  Ferritin 112  Mag-2.0 normal.    Phos - 1.8 (2.5-4.6) No results for input(s): APTT, INR, PTT in the last 168 hours.   Last EGD: 07/17/2014 -mild portal gastropathy and antral gastritis, no presence of esophageal or duodenal  pathology. Last Colonoscopy: Per the patient this was performed 20 years ago.  Last one report on file in 2003, which was normal., last flexible sigmoidoscopy in 06/16/2018 -found to have perianal rash, rectum tenderness found on digital exam, presence of congested and decreased vascular pattern in the rectum, sigmoid, and descending colon which was biopsied.  Biopsies were unremarkable.  Assessment/Plan: Ms. Seif is a 59 y.o. female admitted for diarrhea and confusion.  1.  Diarrhea-associated with severe electrolyte abnormalities on admission which have improved.  C. difficile negative and GI pathogen panel negative.  CT showed extensive colon wall thickening throughout the transverse with fluid in the distal colon and rectum, TI may also be involved.  She is scheduled for a flex sig later today for evaluation.   2.  Thrombocytopenia and hypoalbuminemia with small volume ascites on CT-concern for chronic underlying liver disease.  Serologic evaluation for liver disease was ordered yesterday which is pending.  We will check an INR today prior to flex sig.  She denies any heavy alcohol use in the past and states she currently does not drink alcohol.  Further recommendations pending.  Recommendations: -Check INR  -Flex sig this afternoon w/ tap water enema prior  -Follow up pending labs (hepatitis A/B/C, autoimmune markers, etc)  Case discussed w/ Dr Laural Golden and plan discussed w/ RN  Please call with questions or concerns.    Ronney Asters, PA-C [facility]astrointestinal [facility]

## 2020-09-03 NOTE — Progress Notes (Signed)
PROGRESS NOTE  Deborah Russo  DOB: Nov 11, 1961  PCP: Baruch Gouty, FNP MAU:[id number]  DOA: 08/28/2020  LOS: 6 days   Chief Complaint  Patient presents with  . Altered Mental Status    Brief narrative: Deborah Russo is a 59 y.o. female with PMH of HTN, HLD, prediabetes, stroke, hypothyroidism, vitamin B12 deficiency, thrombocytopenia, GERD, chronic migraine, chronic pain, chronic nausea, vomiting, abdominal pain. Patient presented to the ED on 08/28/2020 with altered mental status.  Caregiver found her confused, lying in her urine and feces. EMS brought her to ED.    In the ED, patient had a low-grade temperature of 99.4, initially hypotensive with systolic blood pressure in 70s, blood pressure improved with IV fluids to 90s. Labs showed potassium level less than 2, WBC normal.  Lactic acid level is elevated to 2.3.  CT of the brain showed a very small subdural hematoma on the left lateral convexity.  Chest x-ray showed mild increased interstitial markings without consolidation.  Urinalysis was negative for pyuria.   Patient was admitted to hospitalist service for further evaluation management. CT abdomen and pelvis obtained subsequently showed an extensive colonic wall thickening throughout, most conspicuous in the transverse colon, and there is fluid in the distal colon and rectum. The terminal ileum may also be involved. Findings are consistent with nonspecific infectious, inflammatory, or ischemic colitis, differential considerations generally including inflammatory bowel disease given distribution and recurrent nature.  Subjective: Patient was seen and examined this morning. Complains of chills and this morning.  Continues to have loose stool from fecal bag.  More than a 1 L in last 24 hours. Blood work continues to show low electrolytes level.  Assessment/Plan: Severe sepsis - poa -secondary to acute gastroenteritis -Brought in with diarrhea, dehydration,  lethargy, confusion -Initial blood pressure was low in 70s resulting in elevated lactic acid 2.3. -Blood cultures negative to date. -With IV antibiotics, fluid resuscitation, sepsis parameters and lactic acid level have improved. Recent Labs  Lab 08/28/20 0630 08/28/20 1009 08/29/20 0715 08/30/20 0745 08/31/20 0807 09/01/20 0719 09/02/20 0537 09/03/20 0659  WBC 8.4  --    < > 7.3 4.9 5.7 5.4 4.9  LATICACIDVEN 2.3*  --   --  1.2  --   --   --   --   PROCALCITON  --  <0.10  --   --   --   --   --   --    < > = values in this interval not displayed.   Acute gastroenteritis -CT scan of abdomen pelvis as above showed extensive colonic wall thickening suggestive of colitis. -GI pathogen panel and C. difficile negative. -Continues to make liquidy stool.  Has a fecal bag in place. -Currently on IV Rocephin and IV Flagyl.  -GI consult appreciated.   -Noted a plan for a flex sigmoidoscopy this afternoon.   -May benefit from scheduled antidiarrheals.  Hypokalemia/hypomagnesemia/hypophosphatemia -Electrolyte levels are low because of significant loss from diarrhea. -Levels are being checked and aggressive replacement being given daily.   -This morning potassium is 4, magnesium is 2 and phosphorus was low at 1.8.  Phosphorus replacement ordered.  Continue daily potassium supplement as well. Recent Labs  Lab 08/30/20 0745 08/31/20 0807 09/01/20 0719 09/02/20 0537 09/03/20 0659  K 2.7* 2.6* 2.5* 2.8* 4.0  MG 1.8 1.5* 1.8 1.7 2.0  PHOS  --  0.7* 1.6* 1.8* 1.8*   Acute metabolic encephalopathy -Multifactorial including sepsis, dehydration, electrolyte derangements, hypothyroidism, subdural hematoma, elevated TSH and benzodiazepine  in urine. -Mental status gradually improving but not back to baseline. Subdural hematoma -Previous hospitalist spoke with neurosurgery, Dr. Merlyn Albert conservative/nonoperative management -Recommended to hold aspirin for a week  Impaired mobility,  generalized weakness -Care dependent at home. -PT eval recommended SNF.  Hypothyroidism -Significantly elevated TSH to 20. -Unclear if she was compliant to Synthroid at home. -Continue Synthroid at current dose. Recent Labs    08/28/20 1009  TSH 20.018*   Macrocytosis  Vitamin B12 deficiency -Vitamin B12 low at 187, MCV elevated to 107. Not sure if patient was compliant to supplement at home.  Continue vitamin B12 supplement. Recent Labs    08/28/20 1009 08/29/20 0715 09/02/20 0537 09/03/20 0659  MCV  --    < > 107.0* 106.3*  VITAMINB12 187  --   --   --   FOLATE 5.3*  --   --   --    < > = values in this interval not displayed.   Liver cirrhosis without ascites Thrombocytopenia/hypoalbuminemia -noted on CT abd/pelvis in past -Ammonia level was only slightly elevated -Thrombocytopenia and hypoalbuminemia could be secondary to liver cirrhosis.  Pending INR level. Recent Labs  Lab 08/28/20 0630 08/28/20 0630 08/28/20 1009 08/29/20 0715 08/29/20 0715 08/30/20 0745 08/31/20 0807 09/01/20 0719 09/02/20 0537 09/03/20 0659 09/03/20 1059  PLT 157   < >  --  123*   < > 128* 85* 68* 62* 58*  --   INR  --   --   --   --   --   --   --   --   --   --  1.1  AMMONIA  --   --  43* 35  --   --   --   --   --   --   --   ALBUMIN 4.0  --   --  3.1*  --  2.9*  --   --   --   --   --    < > = values in this interval not displayed.   Chronic abdominal pain -Continue percocet   Hyperlipidemia -Statin  Essential hypertension -clonidine on hold secondary to soft blood pressure. -Blood pressure remains in 100s.  Elevated troponin -Likely secondary to demand ischemia -EKG without concerning ischemic changes -Echocardiogram - EF 70-75%, mod LVH, no WMA  Mobility: PT eval appreciated Code Status:   Code Status: Full Code  Nutritional status: Body mass index is 31.77 kg/m.     Diet Order            Diet NPO time specified  Diet effective midnight                  DVT prophylaxis: SCDs Start: 08/28/20 1842   Antimicrobials:  IV Rocephin, IV Flagyl Fluid: Continue normal saline at 75 mill per hour Consultants: GI Family Communication:  None at bedside  Status is: Inpatient  Remains inpatient appropriate because continues to have diarrhea, severe electrolyte abnormality, need of IV antibiotics  Dispo: The patient is from: Home              Anticipated d/c is to: SNF likely              Anticipated d/c date is: More than 3 days              Patient currently is not medically stable to d/c.  Infusions:  . sodium chloride 75 mL/hr at 09/03/20 1118  . [MAR Hold] cefTRIAXone (ROCEPHIN)  IV 2  g (09/03/20 1120)  . [MAR Hold] metronidazole 500 mg (09/03/20 0507)    Scheduled Meds: . [MAR Hold] Chlorhexidine Gluconate Cloth  6 each Topical Daily  . [MAR Hold] folic acid  1 mg Intravenous Daily  . [MAR Hold] mouth rinse  15 mL Mouth Rinse BID  . [MAR Hold] oxyCODONE  20 mg Oral Q12H  . [MAR Hold] pantoprazole  40 mg Oral Daily  . [MAR Hold] phosphorus  500 mg Oral BID  . [MAR Hold] potassium chloride SA  40 mEq Oral Daily  . [MAR Hold] thiamine  100 mg Oral Daily  . [MAR Hold] vitamin B-12  250 mcg Oral Daily    Antimicrobials: Anti-infectives (From admission, onward)   Start     Dose/Rate Route Frequency Ordered Stop   09/02/20 0900  [MAR Hold]  cefTRIAXone (ROCEPHIN) 2 g in sodium chloride 0.9 % 100 mL IVPB        (MAR Hold since Wed 09/03/2020 at 1142.Hold Reason: Transfer to a Procedural area.)   2 g 200 mL/hr over 30 Minutes Intravenous Every 24 hours 09/02/20 0858     08/30/20 1545  [MAR Hold]  metroNIDAZOLE (FLAGYL) IVPB 500 mg        (MAR Hold since Wed 09/03/2020 at 1142.Hold Reason: Transfer to a Procedural area.)   500 mg 100 mL/hr over 60 Minutes Intravenous Every 8 hours 08/30/20 1540     08/30/20 1030  ciprofloxacin (CIPRO) IVPB 400 mg  Status:  Discontinued        400 mg 200 mL/hr over 60 Minutes Intravenous Every 12  hours 08/30/20 1011 09/02/20 0858      PRN meds: [MAR Hold] acetaminophen **OR** [MAR Hold] acetaminophen, [MAR Hold] liver oil-zinc oxide, [MAR Hold] loperamide, [MAR Hold] loratadine, [MAR Hold] ondansetron **OR** [MAR Hold] ondansetron (ZOFRAN) IV, [MAR Hold] oxyCODONE-acetaminophen, [MAR Hold] sodium chloride   Objective: Vitals:   09/02/20 2054 09/03/20 0459  BP: 108/73 100/80  Pulse: 76 81  Resp: 18 18  Temp: 97.9 F (36.6 C) 97.7 F (36.5 C)  SpO2: 100% 99%    Intake/Output Summary (Last 24 hours) at 09/03/2020 1158 Last data filed at 09/03/2020 1005 Gross per 24 hour  Intake 3233.52 ml  Output 1500 ml  Net 1733.52 ml   Filed Weights   08/28/20 1649 09/02/20 0458  Weight: 80.3 kg 86.6 kg   Weight change:  Body mass index is 31.77 kg/m.   Physical Exam: General exam: Appears calm and comfortable.  Continues to have weakness Skin: No rashes, lesions or ulcers. HEENT: Atraumatic, normocephalic, supple neck, no obvious bleeding Lungs: Clear to auscultation bilaterally CVS: Regular rate and rhythm, no murmur GI/Abd soft, continues to have mild diffuse abdominal tenderness, nondistended, bowel sound present. Has a fecal bag with brown liquid stool CNS: Alert, awake, oriented to place Psychiatry: Depressed look Extremities: Trace bilateral pedal edema  Data Review: I have personally reviewed the laboratory data and studies available.  Recent Labs  Lab 08/30/20 0745 08/31/20 0807 09/01/20 0719 09/02/20 0537 09/03/20 0659  WBC 7.3 4.9 5.7 5.4 4.9  NEUTROABS 4.8 3.4 3.9 3.5 2.8  HGB 14.0 12.1 11.6* 11.8* 12.1  HCT 43.2 37.9 35.9* 35.3* 37.2  MCV 108.5* 108.6* 107.2* 107.0* 106.3*  PLT 128* 85* 68* 62* 58*   Recent Labs  Lab 08/30/20 0745 08/31/20 0807 09/01/20 0719 09/02/20 0537 09/03/20 0659  NA 144 136 135 136 136  K 2.7* 2.6* 2.5* 2.8* 4.0  CL 111 108 107 111 111  CO2  22 20* 19* 20* 18*  GLUCOSE 170* 141* 150* 137* 160*  BUN 13 13 9 8  6*    CREATININE 0.75 0.76 0.73 0.74 0.61  CALCIUM 7.9* 7.5* 7.1* 7.2* 7.5*  MG 1.8 1.5* 1.8 1.7 2.0  PHOS  --  0.7* 1.6* 1.8* 1.8*    F/u labs ordered  Signed, Terrilee Croak, MD [facility] 09/03/2020

## 2020-09-04 DIAGNOSIS — D696 Thrombocytopenia, unspecified: Secondary | ICD-10-CM

## 2020-09-04 DIAGNOSIS — R4182 Altered mental status, unspecified: Secondary | ICD-10-CM

## 2020-09-04 DIAGNOSIS — R197 Diarrhea, unspecified: Secondary | ICD-10-CM

## 2020-09-04 DIAGNOSIS — E876 Hypokalemia: Secondary | ICD-10-CM

## 2020-09-04 LAB — CBC WITH DIFFERENTIAL/PLATELET
Abs Immature Granulocytes: 0.03 10*3/uL (ref 0.00–0.07)
Basophils Absolute: 0 10*3/uL (ref 0.0–0.1)
Basophils Relative: 1 %
Eosinophils Absolute: 0.1 10*3/uL (ref 0.0–0.5)
Eosinophils Relative: 2 %
HCT: 35.7 % — ABNORMAL LOW (ref 36.0–46.0)
Hemoglobin: 11.8 g/dL — ABNORMAL LOW (ref 12.0–15.0)
Immature Granulocytes: 1 %
Lymphocytes Relative: 45 %
Lymphs Abs: 1.9 10*3/uL (ref 0.7–4.0)
MCH: 35.3 pg — ABNORMAL HIGH (ref 26.0–34.0)
MCHC: 33.1 g/dL (ref 30.0–36.0)
MCV: 106.9 fL — ABNORMAL HIGH (ref 80.0–100.0)
Monocytes Absolute: 0.3 10*3/uL (ref 0.1–1.0)
Monocytes Relative: 6 %
Neutro Abs: 1.9 10*3/uL (ref 1.7–7.7)
Neutrophils Relative %: 45 %
Platelets: 64 10*3/uL — ABNORMAL LOW (ref 150–400)
RBC: 3.34 MIL/uL — ABNORMAL LOW (ref 3.87–5.11)
RDW: 17.1 % — ABNORMAL HIGH (ref 11.5–15.5)
WBC: 4.2 10*3/uL (ref 4.0–10.5)
nRBC: 0 % (ref 0.0–0.2)

## 2020-09-04 LAB — BASIC METABOLIC PANEL
Anion gap: 7 (ref 5–15)
BUN: <5 mg/dL — ABNORMAL LOW (ref 8–23)
CO2: 20 mmol/L — ABNORMAL LOW (ref 22–32)
Calcium: 7.5 mg/dL — ABNORMAL LOW (ref 8.9–10.3)
Chloride: 108 mmol/L (ref 98–111)
Creatinine, Ser: 0.6 mg/dL (ref 0.44–1.00)
GFR calc Af Amer: >60 mL/min (ref >60)
GFR calc non Af Amer: >60 mL/min (ref >60)
Glucose, Bld: 118 mg/dL — ABNORMAL HIGH (ref 70–99)
Potassium: 3 mmol/L — ABNORMAL LOW (ref 3.5–5.1)
Sodium: 135 mmol/L (ref 135–145)

## 2020-09-04 LAB — MITOCHONDRIAL ANTIBODIES: Mitochondrial M2 Ab, IgG: <20 Units (ref 0.0–20.0)

## 2020-09-04 LAB — MAGNESIUM: Magnesium: 1.7 mg/dL (ref 1.7–2.4)

## 2020-09-04 LAB — PHOSPHORUS: Phosphorus: 2.4 mg/dL — ABNORMAL LOW (ref 2.5–4.6)

## 2020-09-04 LAB — IGG: IgG (Immunoglobin G), Serum: 698 mg/dL (ref 586–1602)

## 2020-09-04 LAB — ANA

## 2020-09-04 LAB — ANTI-SMOOTH MUSCLE ANTIBODY, IGG: F-Actin IgG: 9 Units (ref 0–19)

## 2020-09-04 MED ORDER — CYANOCOBALAMIN 1000 MCG/ML IJ SOLN
1000.0000 ug | Freq: Once | INTRAMUSCULAR | Status: AC
  Administered 2020-09-04: 1000 ug via INTRAMUSCULAR
  Filled 2020-09-04: qty 1

## 2020-09-04 MED ORDER — MAGNESIUM SULFATE 2 GM/50ML IV SOLN
2.0000 g | Freq: Once | INTRAVENOUS | Status: AC
  Administered 2020-09-04: 2 g via INTRAVENOUS
  Filled 2020-09-04: qty 50

## 2020-09-04 MED ORDER — LOPERAMIDE HCL 2 MG PO CAPS
2.0000 mg | ORAL_CAPSULE | Freq: Three times a day (TID) | ORAL | Status: DC
  Administered 2020-09-04 – 2020-09-08 (×10): 2 mg via ORAL
  Filled 2020-09-04 (×10): qty 1

## 2020-09-04 MED ORDER — POTASSIUM CHLORIDE CRYS ER 20 MEQ PO TBCR
40.0000 meq | EXTENDED_RELEASE_TABLET | Freq: Once | ORAL | Status: AC
  Administered 2020-09-04: 40 meq via ORAL
  Filled 2020-09-04: qty 2

## 2020-09-04 MED ORDER — FOLIC ACID 1 MG PO TABS
1.0000 mg | ORAL_TABLET | Freq: Every day | ORAL | Status: DC
  Administered 2020-09-05 – 2020-09-09 (×5): 1 mg via ORAL
  Filled 2020-09-04 (×5): qty 1

## 2020-09-04 NOTE — Progress Notes (Signed)
PROGRESS NOTE  Deborah Russo  DOB: 04-Sep-1961  PCP: Baruch Gouty, FNP (Inactive) VEH:[id number]  DOA: 08/28/2020  LOS: 7 days   Chief Complaint  Patient presents with  . Altered Mental Status    Brief narrative: Deborah Russo is a 59 y.o. female with PMH of HTN, HLD, prediabetes, stroke, hypothyroidism, vitamin B12 deficiency, thrombocytopenia, GERD, chronic migraine, chronic pain, chronic nausea, vomiting, abdominal pain. Patient presented to the ED on 08/28/2020 with altered mental status.  Caregiver found her confused, lying in her urine and feces. EMS brought her to ED.    In the ED, patient had a low-grade temperature of 99.4, initially hypotensive with systolic blood pressure in 70s, blood pressure improved with IV fluids to 90s. Labs showed potassium level less than 2, WBC normal.  Lactic acid level is elevated to 2.3.  CT of the brain showed a very small subdural hematoma on the left lateral convexity.  Chest x-ray showed mild increased interstitial markings without consolidation.  Urinalysis was negative for pyuria.   Patient was admitted to hospitalist service for further evaluation management. CT abdomen and pelvis obtained subsequently showed an extensive colonic wall thickening throughout, most conspicuous in the transverse colon, and there is fluid in the distal colon and rectum. The terminal ileum may also be involved. Findings are consistent with nonspecific infectious, inflammatory, or ischemic colitis, differential considerations generally including inflammatory bowel disease given distribution and recurrent nature.  Subjective: Patient was seen and examined this morning. Lying on bed.  Feels the same. 1500 mL of liquid stool documented in last 24 hours. Labs this morning with potassium low at 3, phosphorus low at 2.4 and magnesium low at 1.7.  Assessment/Plan: Severe sepsis - poa -secondary to acute gastroenteritis -Brought in with diarrhea,  dehydration, lethargy, confusion -Initial blood pressure was low in 70s resulting in elevated lactic acid 2.3. -Blood cultures negative to date. -With IV antibiotics, fluid resuscitation, sepsis parameters and lactic acid level have improved.  Acute gastroenteritis -CT scan of abdomen pelvis as above showed extensive colonic wall thickening suggestive of colitis. -GI pathogen panel and C. difficile negative. -Continues to make liquidy stool.  -GI consult appreciated.   -9/15, patient underwent colonoscopy.  Noted to have diffusely congested colon.  Biopsies taken. -Currently on IV Rocephin and IV Flagyl.  GI and start the patient on scheduled colestipol and as needed Imodium. -Continues to have liquid stool.  Will benefit from reinsertion of Flexi-Seal.  Hypokalemia/hypomagnesemia/hypophosphatemia -Electrolyte levels are low because of significant loss from diarrhea. -Levels are being checked and aggressive replacement being given daily.   -This morning potassium is 3, magnesium is 1.7 and phosphorus was low at 2.4.   -Patient is already getting daily potassium and phosphorus replacement.  More replacements ordered this morning.   Recent Labs  Lab 08/31/20 0807 09/01/20 0719 09/02/20 0537 09/03/20 0659 09/04/20 0256  K 2.6* 2.5* 2.8* 4.0 3.0*  MG 1.5* 1.8 1.7 2.0 1.7  PHOS 0.7* 1.6* 1.8* 1.8* 2.4*   Acute metabolic encephalopathy -Multifactorial including sepsis, dehydration, electrolyte derangements, hypothyroidism, subdural hematoma, elevated TSH and benzodiazepine in urine. -Mental status gradually improving but not back to baseline. Subdural hematoma -Previous hospitalist spoke with neurosurgery, Dr. Merlyn Albert conservative/nonoperative management -Recommended to hold aspirin for a week  Impaired mobility, generalized weakness -Care dependent at home. -PT eval recommended SNF.  Hypothyroidism -Significantly elevated TSH to 20. -Unclear if she was compliant to  Synthroid at home. -Continue Synthroid at current dose.  Macrocytosis  Vitamin B12  deficiency -Vitamin B12 low at 187, MCV elevated to 107. Not sure if patient was compliant to supplement at home.  Continue vitamin B12 supplement. Recent Labs    08/28/20 1009 08/29/20 0715 09/03/20 0659 09/04/20 0256  MCV  --    < > 106.3* 106.9*  VITAMINB12 187  --   --   --   FOLATE 5.3*  --   --   --    < > = values in this interval not displayed.   Liver cirrhosis without ascites Thrombocytopenia/hypoalbuminemia -noted on CT abd/pelvis in past -Ammonia level was only slightly elevated -Thrombocytopenia and hypoalbuminemia could be secondary to liver cirrhosis. Recent Labs  Lab 08/29/20 0715 08/29/20 0715 08/30/20 0745 08/30/20 0745 08/31/20 3329 09/01/20 0719 09/02/20 0537 09/03/20 0659 09/03/20 1059 09/04/20 0256  PLT 123*   < > 128*   < > 85* 68* 62* 58*  --  64*  INR  --   --   --   --   --   --   --   --  1.1  --   AMMONIA 35  --   --   --   --   --   --   --   --   --   ALBUMIN 3.1*  --  2.9*  --   --   --   --   --   --   --    < > = values in this interval not displayed.   Chronic abdominal pain -Continue percocet   Hyperlipidemia -Statin  Essential hypertension -clonidine on hold secondary to soft blood pressure. -Blood pressure remains in 100s.  Elevated troponin -Likely secondary to demand ischemia -EKG without concerning ischemic changes -Echocardiogram - EF 70-75%, mod LVH, no WMA  Mobility: PT eval appreciated Code Status:   Code Status: Full Code  Nutritional status: Body mass index is 35.2 kg/m.     Diet Order            Diet Carb Modified Fluid consistency: Thin; Room service appropriate? Yes  Diet effective now                 DVT prophylaxis: SCDs Start: 08/28/20 1842   Antimicrobials:  IV Rocephin, IV Flagyl Fluid: Continue normal saline at 75 mill per hour Consultants: GI Family Communication:  None at bedside  Status is:  Inpatient  Remains inpatient appropriate because continues to have diarrhea, severe electrolyte abnormality, need of IV antibiotics  Dispo: The patient is from: Home              Anticipated d/c is to: SNF likely              Anticipated d/c date is: More than 3 days              Patient currently is not medically stable to d/c.  Infusions:  . sodium chloride 75 mL/hr at 09/04/20 0948  . cefTRIAXone (ROCEPHIN)  IV 2 g (09/04/20 1052)  . metronidazole 500 mg (09/04/20 0558)    Scheduled Meds: . Chlorhexidine Gluconate Cloth  6 each Topical Daily  . colestipol  1 g Oral BID  . folic acid  1 mg Intravenous Daily  . mouth rinse  15 mL Mouth Rinse BID  . oxyCODONE  20 mg Oral Q12H  . pantoprazole  40 mg Oral Daily  . phosphorus  500 mg Oral BID  . potassium chloride SA  40 mEq Oral Daily  . thiamine  100  mg Oral Daily  . vitamin B-12  250 mcg Oral Daily    Antimicrobials: Anti-infectives (From admission, onward)   Start     Dose/Rate Route Frequency Ordered Stop   09/02/20 0900  cefTRIAXone (ROCEPHIN) 2 g in sodium chloride 0.9 % 100 mL IVPB        2 g 200 mL/hr over 30 Minutes Intravenous Every 24 hours 09/02/20 0858     08/30/20 1545  metroNIDAZOLE (FLAGYL) IVPB 500 mg        500 mg 100 mL/hr over 60 Minutes Intravenous Every 8 hours 08/30/20 1540     08/30/20 1030  ciprofloxacin (CIPRO) IVPB 400 mg  Status:  Discontinued        400 mg 200 mL/hr over 60 Minutes Intravenous Every 12 hours 08/30/20 1011 09/02/20 0858      PRN meds: acetaminophen **OR** acetaminophen, liver oil-zinc oxide, loperamide, loratadine, ondansetron **OR** ondansetron (ZOFRAN) IV, oxyCODONE-acetaminophen, sodium chloride   Objective: Vitals:   09/03/20 2219 09/04/20 0559  BP: 133/85 108/68  Pulse: 82 79  Resp:  16  Temp: 98.3 F (36.8 C) 97.8 F (36.6 C)  SpO2: 97% 100%    Intake/Output Summary (Last 24 hours) at 09/04/2020 1100 Last data filed at 09/04/2020 0800 Gross per 24 hour  Intake  1400 ml  Output 400 ml  Net 1000 ml   Filed Weights   08/28/20 1649 09/02/20 0458 09/04/20 0637  Weight: 80.3 kg 86.6 kg 87.3 kg   Weight change:  Body mass index is 35.2 kg/m.   Physical Exam: General exam: Appears calm and comfortable.  Continues to have weakness Skin: No rashes, lesions or ulcers. HEENT: Atraumatic, normocephalic, supple neck, no obvious bleeding Lungs: Clear to auscultation bilaterally CVS: Regular rate and rhythm, no murmur GI/Abd soft, continues to have mild diffuse abdominal tenderness, nondistended, bowel sound present. Has a fecal bag with brown liquid stool CNS: Alert, awake, oriented to place Psychiatry: Depressed look Extremities: Trace bilateral pedal edema  Data Review: I have personally reviewed the laboratory data and studies available.  Recent Labs  Lab 08/31/20 0807 09/01/20 0719 09/02/20 0537 09/03/20 0659 09/04/20 0256  WBC 4.9 5.7 5.4 4.9 4.2  NEUTROABS 3.4 3.9 3.5 2.8 1.9  HGB 12.1 11.6* 11.8* 12.1 11.8*  HCT 37.9 35.9* 35.3* 37.2 35.7*  MCV 108.6* 107.2* 107.0* 106.3* 106.9*  PLT 85* 68* 62* 58* 64*   Recent Labs  Lab 08/31/20 0807 09/01/20 0719 09/02/20 0537 09/03/20 0659 09/04/20 0256  NA 136 135 136 136 135  K 2.6* 2.5* 2.8* 4.0 3.0*  CL 108 107 111 111 108  CO2 20* 19* 20* 18* 20*  GLUCOSE 141* 150* 137* 160* 118*  BUN 13 9 8  6* <5*  CREATININE 0.76 0.73 0.74 0.61 0.60  CALCIUM 7.5* 7.1* 7.2* 7.5* 7.5*  MG 1.5* 1.8 1.7 2.0 1.7  PHOS 0.7* 1.6* 1.8* 1.8* 2.4*    F/u labs ordered  Signed, Terrilee Croak, MD [facility] 09/04/2020

## 2020-09-04 NOTE — Progress Notes (Signed)
Physical Therapy Treatment Patient Details Name: Deborah Russo MRN: [id number] DOB: March 13, 1961 Today's Date: 09/04/2020    History of Present Illness 59 year old female with a history of stroke, chronic nausea and vomiting and abdominal pain, hypothyroidism, low B12, thrombocytopenia, hyperlipidemia, hypertension, GERD, prediabetes presenting with altered mental status.  Unfortunately, the patient is unable to provide any significant history secondary to her altered mental status.  Attempts were made to contact the patient's family without any answer.  Apparently, the patient lives with caregivers that assist with her activities of daily living.  Apparently, one of the caregivers was gone for the weekend, and the other caregiver came by to check up on the patient and found the patient lying in her urine and feces.  She was confused.  EMS was activated.  The patient was awake and alert, but slow to respond and confused during my examination.  Review of systems was extremely limited.  She did deny any headache, visual disturbance, chest pain, shortness breath, abdominal pain.    PT Comments    Overall, pt appears lethargic and flat, agreeable to exercise but with minimal to no conversation throughout session. Pt more mobile this session, tolerates seated therapeutic exercise with cues for performance. Pt motivated to get into chair, but requests to get back into bed after sitting for ~3 minutes and therapist unable to encourage pt to remain up due to pt's abdominal pain in sitting. Pt able to take limited steps with RW and assist to steady. Pt assisted back to supine and nursing staff in room at [place]. Patient will benefit from continued physical therapy in hospital and recommendations below to increase strength, balance, endurance for safe ADLs and gait.    Follow Up Recommendations  SNF     Equipment Recommendations  None recommended by PT    Recommendations for Other Services        Precautions / Restrictions Precautions Precautions: Fall Restrictions Weight Bearing Restrictions: No    Mobility  Bed Mobility Overal bed mobility: Needs Assistance Bed Mobility: Rolling;Supine to Sit;Sit to Supine Rolling: Supervision   Supine to sit: Min assist;HOB elevated Sit to supine: Min assist   General bed mobility comments: pt able to roll into sidelying with verbal cues and use of bedrail, min A to upright trunk with HOB elevated and assist from therapist, min A to lift BLE back into bed to prevent them from falling off edge  Transfers Overall transfer level: Needs assistance Equipment used: Rolling walker (2 wheeled) Transfers: Sit to/from [facility] Sit to Stand: Min assist;From elevated surface;Mod assist Stand pivot transfers: Min assist  General transfer comment: min A to power up for elevated bed, mod A to power up from low chair in room, cues for hand placement and sequencing with pivoting over to bedside  Ambulation/Gait Ambulation/Gait assistance: Min assist  Assistive device: Rolling walker (2 wheeled) Gait Pattern/deviations: Shuffle Gait velocity: decreased   General Gait Details: slow, short steps at bedside forward, backwards and laterally, flexed over RW possibly due to pain, limited due to abdominal pain   Stairs             Wheelchair Mobility    Modified Rankin (Stroke Patients Only)       Balance Overall balance assessment: Needs assistance Sitting-balance support: Feet supported;Bilateral upper extremity supported Sitting balance-Leahy Scale: Fair Sitting balance - Comments: seated EOB   Standing balance support: During functional activity;Bilateral upper extremity supported Standing balance-Leahy Scale: Poor Standing balance comment: reliant on UE support  and assist from therapist         Cognition Arousal/Alertness: Lethargic Behavior During Therapy: Flat affect Overall Cognitive Status: No  family/caregiver present to determine baseline cognitive functioning  General Comments: Pt follows commands appropriately, responds with 1-2 word answers, pleasant but not conversational      Exercises General Exercises - Lower Extremity Long Arc Quad: AROM;Strengthening;Both;10 reps;Seated Hip Flexion/Marching: AROM;Strengthening;Both;10 reps;Seated Toe Raises: AROM;Strengthening;Both;10 reps;Seated Heel Raises: AROM;Strengthening;Both;10 reps;Seated    General Comments        Pertinent Vitals/Pain Pain Assessment: Faces Faces Pain Scale: Hurts little more Pain Location: abdomen Pain Descriptors / Indicators: Other (Comment) ("like I'm having a baby") Pain Intervention(s): Limited activity within patient's tolerance;Monitored during session;Repositioned    Home Living                      Prior Function            PT Goals (current goals can now be found in the care plan section) Acute Rehab PT Goals Patient Stated Goal: decrease stomach pain PT Goal Formulation: With patient Time For Goal Achievement: 09/12/20 Progress towards PT goals: Progressing toward goals    Frequency    Min 3X/week      PT Plan Current plan remains appropriate    Co-evaluation              AM-PAC PT "6 Clicks" Mobility   Outcome Measure  Help needed turning from your back to your side while in a flat bed without using bedrails?: A Little Help needed moving from lying on your back to sitting on the side of a flat bed without using bedrails?: A Little Help needed moving to and from a bed to a chair (including a wheelchair)?: A Lot Help needed standing up from a chair using your arms (e.g., wheelchair or bedside chair)?: A Lot Help needed to walk in hospital room?: A Lot Help needed climbing 3-5 steps with a railing? : Total 6 Click Score: 13    End of Session Equipment Utilized During Treatment: Gait belt Activity Tolerance: Patient limited by pain Patient left: in  bed;with call bell/phone within reach;with nursing/sitter in room Nurse Communication: Mobility status PT Visit Diagnosis: Unsteadiness on feet (R26.81);Other abnormalities of gait and mobility (R26.89);Muscle weakness (generalized) (M62.81)     Time: 3887-1959 PT Time Calculation (min) (ACUTE ONLY): 35 min  Charges:  $Therapeutic Exercise: 8-22 mins $Therapeutic Activity: 8-22 mins                      Talbot Grumbling PT, DPT 09/04/20, 12:59 PM [phone]

## 2020-09-04 NOTE — Progress Notes (Addendum)
Subjective:  Patient says she does not feel well.  She says she ate some of her breakfast and lunch.  According to nursing staff she ate less than 50%.  She already has had 3 loose stools.  Stools are moderate volume and watery.  Patient complains of epigastric pain. Patient says she does not have any close family members.  Both her children died of drug overdose at age 61 and 31.  She says she has been in assisted living  Current Medications:  Current Facility-Administered Medications:  .  0.9 %  sodium chloride infusion, , Intravenous, Continuous, Dahal, Binaya, MD, Last Rate: 75 mL/hr at 09/04/20 0948, New Bag at 09/04/20 0948 .  acetaminophen (TYLENOL) tablet 650 mg, 650 mg, Oral, Q6H PRN, 650 mg at 09/02/20 1814 **OR** acetaminophen (TYLENOL) suppository 650 mg, 650 mg, Rectal, Q6H PRN, Tat, David, MD .  cefTRIAXone (ROCEPHIN) 2 g in sodium chloride 0.9 % 100 mL IVPB, 2 g, Intravenous, Q24H, Dahal, Binaya, MD, Last Rate: 200 mL/hr at 09/04/20 1052, 2 g at 09/04/20 1052 .  Chlorhexidine Gluconate Cloth 2 % PADS 6 each, 6 each, Topical, Daily, Dahal, Binaya, MD, 6 each at 09/04/20 0945 .  colestipol (COLESTID) tablet 1 g, 1 g, Oral, BID, Montez Morita, Daniel, MD, 1 g at 09/04/20 0945 .  [START ON 7/42/5956] folic acid (FOLVITE) tablet 1 mg, 1 mg, Oral, Daily, Dahal, Binaya, MD .  liver oil-zinc oxide (DESITIN) 40 % ointment, , Topical, PRN, Terrilee Croak, MD, Given at 09/02/20 1815 .  loperamide (IMODIUM) capsule 2 mg, 2 mg, Oral, Q6H PRN, Dahal, Binaya, MD, 2 mg at 09/03/20 2131 .  loratadine (CLARITIN) tablet 10 mg, 10 mg, Oral, Daily PRN, Dahal, Binaya, MD, 10 mg at 09/02/20 1814 .  MEDLINE mouth rinse, 15 mL, Mouth Rinse, BID, Tat, David, MD, 15 mL at 09/04/20 0945 .  metroNIDAZOLE (FLAGYL) IVPB 500 mg, 500 mg, Intravenous, Q8H, Dahal, Binaya, MD, Last Rate: 100 mL/hr at 09/04/20 1404, 500 mg at 09/04/20 1404 .  ondansetron (ZOFRAN) tablet 4 mg, 4 mg, Oral, Q6H PRN, 4 mg at 08/30/20  1034 **OR** ondansetron (ZOFRAN) injection 4 mg, 4 mg, Intravenous, Q6H PRN, Tat, David, MD, 4 mg at 08/30/20 1743 .  oxyCODONE (OXYCONTIN) 12 hr tablet 20 mg, 20 mg, Oral, Q12H, Dahal, Binaya, MD, 20 mg at 09/04/20 0556 .  oxyCODONE-acetaminophen (PERCOCET/ROXICET) 5-325 MG per tablet 1 tablet, 1 tablet, Oral, Q8H PRN, Terrilee Croak, MD, 1 tablet at 09/03/20 2131 .  pantoprazole (PROTONIX) EC tablet 40 mg, 40 mg, Oral, Daily, Dahal, Binaya, MD, 40 mg at 09/04/20 0945 .  phosphorus (K PHOS NEUTRAL) tablet 500 mg, 500 mg, Oral, BID, Dahal, Binaya, MD, 500 mg at 09/04/20 0944 .  potassium chloride SA (KLOR-CON) CR tablet 40 mEq, 40 mEq, Oral, Daily, Dahal, Binaya, MD, 40 mEq at 09/04/20 0944 .  sodium chloride (OCEAN) 0.65 % nasal spray 1 spray, 1 spray, Each Nare, PRN, Dahal, Binaya, MD, 1 spray at 09/02/20 1353 .  thiamine tablet 100 mg, 100 mg, Oral, Daily, Tat, David, MD, 100 mg at 09/04/20 0944 .  vitamin B-12 (CYANOCOBALAMIN) tablet 250 mcg, 250 mcg, Oral, Daily, Tat, David, MD, 250 mcg at 09/04/20 0944   Objective: Blood pressure 130/80, pulse 80, temperature (!) 97.5 F (36.4 C), resp. rate 18, height 5' 2" (1.575 m), weight 87.3 kg, SpO2 99 %. Patient is alert and responds appropriately to questions. She appears to be chronically ill. She has small scab over tip of her  nose. Abdomen is protuberant.  Bowel sounds are normal.  On palpation abdomen is soft.  She has mild midepigastric tenderness.  No organomegaly or masses. She has changes of stasis dermatitis involving both legs with increased pigmentation and scaly skin but no edema at present.  Labs/studies Results:  CBC Latest Ref Rng & Units 09/04/2020 09/03/2020 09/02/2020  WBC 4.0 - 10.5 K/uL 4.2 4.9 5.4  Hemoglobin 12.0 - 15.0 g/dL 11.8(L) 12.1 11.8(L)  Hematocrit 36 - 46 % 35.7(L) 37.2 35.3(L)  Platelets 150 - 400 K/uL 64(L) 58(L) 62(L)    CMP Latest Ref Rng & Units 09/04/2020 09/03/2020 09/02/2020  Glucose 70 - 99 mg/dL 118(H)  160(H) 137(H)  BUN 8 - 23 mg/dL <5(L) 6(L) 8  Creatinine 0.44 - 1.00 mg/dL 0.60 0.61 0.74  Sodium 135 - 145 mmol/L 135 136 136  Potassium 3.5 - 5.1 mmol/L 3.0(L) 4.0 2.8(L)  Chloride 98 - 111 mmol/L 108 111 111  CO2 22 - 32 mmol/L 20(L) 18(L) 20(L)  Calcium 8.9 - 10.3 mg/dL 7.5(L) 7.5(L) 7.2(L)  Total Protein 6.5 - 8.1 g/dL - - -  Total Bilirubin 0.3 - 1.2 mg/dL - - -  Alkaline Phos 38 - 126 U/L - - -  AST 15 - 41 U/L - - -  ALT 0 - 44 U/L - - -    Hepatic Function Latest Ref Rng & Units 08/30/2020 08/29/2020 08/28/2020  Total Protein 6.5 - 8.1 g/dL 6.0(L) 6.2(L) -  Albumin 3.5 - 5.0 g/dL 2.9(L) 3.1(L) -  AST 15 - 41 U/L 16 18 -  ALT 0 - 44 U/L 16 17 -  Alk Phosphatase 38 - 126 U/L 69 73 -  Total Bilirubin 0.3 - 1.2 mg/dL 1.3(H) 1.8(H) 2.1(H)  Bilirubin, Direct 0.0 - 0.2 mg/dL - - 0.7(H)    Ileal and colonic biopsies will be out tomorrow.  Hepatitis A total antibody nonreactive Hepatitis B surface antigen negative. Hepatitis BC IgM antibody nonreactive. Hepatitis B surface antibody and B core total antibody reactive. HCV antibody nonreactive.  Serum IgG [phone])  ANA, smooth muscle antibody, antimitochondrial antibodies pending  GI pathogen panel is negative.    Assessment:  #1.  Chronic diarrhea.  GI pathogen panel is negative.  She underwent colonoscopy with ileoscopy by Dr. Jenetta Downer.  She had diffuse colonic edema and flattening to mucosa of terminal ileum.  It remains to be seen if biopsy will provide critical information.  I suspect these changes are due to hypoproteinemia and edema.  Need to rescreen for celiac disease. She has significant calcification into the origin of SMA and minimal calcification to the origin of celiac trunk.  If her symptom complex remains unexplained would consider CT abdomen and pelvis.  She has B12 deficiency and folate deficiency which would go along with malabsorptive diarrhea.  She is on oral B12 but I am afraid she may not absorb  it.  #2.  Hypokalemia secondary to GI losses.  #3.  Thrombocytopenia.  She has fatty liver.  She also has small amount of ascites raising possibility of cirrhosis.  Ascites could also be due to malnutrition and hypoalbuminemia. Elastography may help determine noninvasively if she has cirrhosis.  #4.  Mental status changes.  CT did reveal very small subdural hematoma at left frontal convexity measuring only 4 mm in maximal thickness.  Suspect mental status changes due to dehydration.   Plan:  Celiac antibody panel. Fecal elastase. Consider parenteral B12 as she may not absorb oral B12. Change loperamide to 2 mg  p.o. 3 times daily. Abdominal ultrasound with elastography.

## 2020-09-04 NOTE — Op Note (Signed)
[facility] Patient Name: Deborah Russo Procedure Date: 09/03/2020 11:47 AM MRN: [id number] Date of Birth: June 17, 1961 Attending MD: [name] ,  CSN: [id number] Age: 59 Admit Type: Inpatient Procedure:                Colonoscopy Indications:              Diarrhea, Pancolitis Providers:                [name], Lurline Del, RN, Casimer Bilis, Technician Referring MD:              Medicines:                Monitored Anesthesia Care Complications:            No immediate complications. Estimated Blood Loss:     Estimated blood loss: none. Procedure:                Pre-Anesthesia Assessment:                           - Prior to the procedure, a History and Physical                            was performed, and patient medications, allergies                            and sensitivities were reviewed. The patient's                            tolerance of previous anesthesia was reviewed.                           - The risks and benefits of the procedure and the                            sedation options and risks were discussed with the                            patient. All questions were answered and informed                            consent was obtained.                           - ASA Grade Assessment: III - A patient with severe                            systemic disease.                           After obtaining informed consent, the colonoscope                            was passed under direct vision. Throughout the  procedure, the patient's blood pressure, pulse, and                            oxygen saturations were monitored continuously. The                            [id number]-[id number] ([id number]) scope was introduced through                            the anus and advanced to the the terminal ileum.                            The flexible sigmoidoscopy was accomplished without                             difficulty. The patient tolerated the procedure                            well. After obtaining informed consent, the                            colonoscope was passed under direct vision.                            Throughout the procedure, the patient's blood                            pressure, pulse, and oxygen saturations were                            monitored continuously.Scope withdrawal time was 18                            minutes. The quality of the bowel preparation was                            excellent. Scope In: 12:30:18 PM Scope Out: 12:57:55 PM Scope Withdrawal Time: 0 hours 23 minutes 15 seconds  Total Procedure Duration: 0 hours 27 minutes 37 seconds  Findings:      The perianal exam findings include presence of extensive perianal       erythema and excoriated skin with breaks in perianal area.      The terminal ileum was diffusely atrophic, villi appeared to be       flattened. Biopsies were taken with a cold forceps for histology.      Diffusely congested mucosa was found in the entire colon, predominantly       pale in appearance with some localized areas of erythema. The colon       appeared to lack the normal hastrautions, it was rather tubular and       flattened. Biopsies were taken with a cold forceps for histology, also       for evaluation of amyloidosis, HSV, CMV.      Possible hyperkeratotic mucosa was found at the anorectal junction. This       was visualized in forward view and retroflexion. Biopsies  were taken       with a cold forceps for histology.      Six medium-sized localized angiodysplastic lesions without bleeding were       found in the cecum. Impression:               - Presence of extensive perianal erythema and                            excoriated skin breaks in found on perianal exam.                           - Atrophic mucosa with flattened villi in the                            terminal ileum. Biopsied.                            - Six non bleeding AVMs in cecum.                           - Congested mucosa in the entire examined colon.                            Biopsied.                           - Possible hyperkeratotic mucosa at the anorectal                            junction. Biopsied. Moderate Sedation:      Per Anesthesia Care Recommendation:           - Return patient to hospital ward for ongoing care.                           - Resume previous diet.                           - Await pathology results.                           - Start Imodium as needed and Colestid 1 g BID. Procedure Code(s):        --- Professional ---                           [phone], GC, Colonoscopy, flexible; with biopsy,                            single or multiple Diagnosis Code(s):        --- Professional ---                           K63.89, Other specified diseases of intestine                           R19.7, Diarrhea, unspecified CPT copyright 2019 [employer]. All rights reserved. The codes  documented in this report are preliminary and upon coder review may  be revised to meet current compliance requirements. [name], MD [name],  09/03/2020 1:09:19 PM This report has been signed electronically. Number of Addenda: 0

## 2020-09-05 ENCOUNTER — Encounter (HOSPITAL_COMMUNITY): Payer: Self-pay | Admitting: Gastroenterology

## 2020-09-05 ENCOUNTER — Inpatient Hospital Stay (HOSPITAL_COMMUNITY): Payer: Medicare HMO

## 2020-09-05 LAB — GLIADIN ANTIBODIES, SERUM
Antigliadin Abs, IgA: 4 units (ref 0–19)
Gliadin IgG: 2 units (ref 0–19)

## 2020-09-05 LAB — BASIC METABOLIC PANEL
Anion gap: 7 (ref 5–15)
BUN: 5 mg/dL — ABNORMAL LOW (ref 8–23)
CO2: 20 mmol/L — ABNORMAL LOW (ref 22–32)
Calcium: 6.9 mg/dL — ABNORMAL LOW (ref 8.9–10.3)
Chloride: 109 mmol/L (ref 98–111)
Creatinine, Ser: 0.54 mg/dL (ref 0.44–1.00)
GFR calc Af Amer: >60 mL/min (ref >60)
GFR calc non Af Amer: >60 mL/min (ref >60)
Glucose, Bld: 124 mg/dL — ABNORMAL HIGH (ref 70–99)
Potassium: 2.5 mmol/L — CL (ref 3.5–5.1)
Sodium: 136 mmol/L (ref 135–145)

## 2020-09-05 LAB — CBC WITH DIFFERENTIAL/PLATELET
Abs Immature Granulocytes: 0.03 10*3/uL (ref 0.00–0.07)
Basophils Absolute: 0 10*3/uL (ref 0.0–0.1)
Basophils Relative: 1 %
Eosinophils Absolute: 0 10*3/uL (ref 0.0–0.5)
Eosinophils Relative: 1 %
HCT: 31.3 % — ABNORMAL LOW (ref 36.0–46.0)
Hemoglobin: 10.4 g/dL — ABNORMAL LOW (ref 12.0–15.0)
Immature Granulocytes: 1 %
Lymphocytes Relative: 44 %
Lymphs Abs: 1.6 10*3/uL (ref 0.7–4.0)
MCH: 35.6 pg — ABNORMAL HIGH (ref 26.0–34.0)
MCHC: 33.2 g/dL (ref 30.0–36.0)
MCV: 107.2 fL — ABNORMAL HIGH (ref 80.0–100.0)
Monocytes Absolute: 0.2 10*3/uL (ref 0.1–1.0)
Monocytes Relative: 5 %
Neutro Abs: 1.7 10*3/uL (ref 1.7–7.7)
Neutrophils Relative %: 48 %
Platelets: 71 10*3/uL — ABNORMAL LOW (ref 150–400)
RBC: 2.92 MIL/uL — ABNORMAL LOW (ref 3.87–5.11)
RDW: 16.7 % — ABNORMAL HIGH (ref 11.5–15.5)
WBC: 3.6 10*3/uL — ABNORMAL LOW (ref 4.0–10.5)
nRBC: 0 % (ref 0.0–0.2)

## 2020-09-05 LAB — MAGNESIUM: Magnesium: 1.6 mg/dL — ABNORMAL LOW (ref 1.7–2.4)

## 2020-09-05 LAB — TISSUE TRANSGLUTAMINASE, IGA: Tissue Transglutaminase Ab, IgA: <2 U/mL (ref 0–3)

## 2020-09-05 LAB — TSH: TSH: 70.684 u[IU]/mL — ABNORMAL HIGH (ref 0.350–4.500)

## 2020-09-05 LAB — PHOSPHORUS: Phosphorus: 2.2 mg/dL — ABNORMAL LOW (ref 2.5–4.6)

## 2020-09-05 MED ORDER — POTASSIUM CHLORIDE CRYS ER 20 MEQ PO TBCR: 40.0000 meq | EXTENDED_RELEASE_TABLET | Freq: Two times a day (BID) | ORAL | Status: DC

## 2020-09-05 MED ORDER — POTASSIUM CHLORIDE CRYS ER 20 MEQ PO TBCR
40.0000 meq | EXTENDED_RELEASE_TABLET | Freq: Three times a day (TID) | ORAL | Status: DC
  Administered 2020-09-05 – 2020-09-09 (×14): 40 meq via ORAL
  Filled 2020-09-05 (×13): qty 2
  Filled 2020-09-05: qty 4

## 2020-09-05 MED ORDER — VITAMIN B-12 1000 MCG PO TABS
1000.0000 ug | ORAL_TABLET | Freq: Every day | ORAL | Status: DC
  Administered 2020-09-05 – 2020-09-09 (×5): 1000 ug via ORAL
  Filled 2020-09-05 (×5): qty 1

## 2020-09-05 MED ORDER — CYANOCOBALAMIN 1000 MCG/ML IJ SOLN: 1000.0000 ug | INTRAMUSCULAR | Status: DC

## 2020-09-05 MED ORDER — LEVOTHYROXINE SODIUM 75 MCG PO TABS: 150.0000 ug | ORAL_TABLET | Freq: Every day | ORAL | Status: DC

## 2020-09-05 MED ORDER — LEVOTHYROXINE SODIUM 100 MCG/5ML IV SOLN
75.0000 ug | Freq: Every day | INTRAVENOUS | Status: DC
  Administered 2020-09-05 – 2020-09-09 (×5): 75 ug via INTRAVENOUS
  Filled 2020-09-05 (×5): qty 5

## 2020-09-05 MED ORDER — MAGNESIUM SULFATE 4 GM/100ML IV SOLN
4.0000 g | Freq: Once | INTRAVENOUS | Status: AC
  Administered 2020-09-05: 4 g via INTRAVENOUS
  Filled 2020-09-05: qty 100

## 2020-09-05 MED ORDER — POTASSIUM PHOSPHATES 15 MMOLE/5ML IV SOLN
30.0000 mmol | Freq: Once | INTRAVENOUS | Status: AC
  Administered 2020-09-05: 30 mmol via INTRAVENOUS
  Filled 2020-09-05: qty 10

## 2020-09-05 NOTE — Progress Notes (Signed)
PROGRESS NOTE  Deborah Russo  DOB: 20-Mar-1961  PCP: Baruch Gouty, FNP (Inactive) PPJ:[id number]  DOA: 08/28/2020  LOS: 8 days   Chief Complaint  Patient presents with  . Altered Mental Status    Brief narrative: Deborah Russo is a 59 y.o. female with PMH of HTN, HLD, prediabetes, stroke, hypothyroidism, vitamin B12 deficiency, thrombocytopenia, GERD, chronic migraine, chronic pain, chronic nausea, vomiting, abdominal pain. Patient presented to the ED on 08/28/2020 with altered mental status.  Caregiver found her confused, lying in her urine and feces. EMS brought her to ED.    In the ED, patient had a low-grade temperature of 99.4, initially hypotensive with systolic blood pressure in 70s, blood pressure improved with IV fluids to 90s. Labs showed potassium level less than 2, WBC normal.  Lactic acid level is elevated to 2.3.  CT of the brain showed a very small subdural hematoma on the left lateral convexity.  Chest x-ray showed mild increased interstitial markings without consolidation.  Urinalysis was negative for pyuria.   Patient was admitted to hospitalist service for further evaluation management. CT abdomen and pelvis obtained subsequently showed an extensive colonic wall thickening throughout, most conspicuous in the transverse colon, and there is fluid in the distal colon and rectum. The terminal ileum may also be involved. Findings are consistent with nonspecific infectious, inflammatory, or ischemic colitis, differential considerations generally including inflammatory bowel disease given distribution and recurrent nature.  Her hospitalization is prolonged because of persistent diarrhea.  GI is involved.  Subjective: Patient was seen and examined this morning. Lying on bed.  Feels the same. Fecal bag with liquid stool output.  Documented output of 400 mL in last 24 hours which is less than before. Labs this morning with persistently low potassium, phosphorus  and magnesium level.  Assessment/Plan: Severe sepsis - poa -secondary to acute gastroenteritis -Brought in with diarrhea, dehydration, lethargy, confusion -Initial blood pressure was low in 70s resulting in elevated lactic acid 2.3. -Blood cultures negative to date. -With IV antibiotics, IV fluid resuscitation, sepsis parameters and lactic acid level have improved.  Acute gastroenteritis -CT scan of abdomen pelvis as above showed extensive colonic wall thickening suggestive of colitis. -GI pathogen panel and C. difficile negative. -Continues to make liquidy stool.  -GI consult appreciated.   -9/15, patient underwent colonoscopy.  Noted to have diffusely congested colon.  Biopsies taken. -Currently on IV Rocephin and IV Flagyl.  -Continue scheduled colestipol and as needed Imodium. -Continues to have liquid stool.  Has a Flexi-Seal on. -Further studies per GI.  Hypokalemia/hypomagnesemia/hypophosphatemia -Electrolyte levels are low because of significant loss from diarrhea. -Levels are being checked and aggressive replacement being given daily.   -This morning, potassium is 2.5, magnesium is 1.6 and phosphorus 2.2. -She has been getting oral scheduled potassium and phosphorus replacement.  Added more replacement this morning. Recent Labs  Lab 09/01/20 0719 09/02/20 0537 09/03/20 0659 09/04/20 0256 09/05/20 0740  K 2.5* 2.8* 4.0 3.0* 2.5*  MG 1.8 1.7 2.0 1.7 1.6*  PHOS 1.6* 1.8* 1.8* 2.4* 2.2*   Acute metabolic encephalopathy -Multifactorial including sepsis, dehydration, electrolyte derangements, hypothyroidism, subdural hematoma, elevated TSH and benzodiazepine in urine. -Mental status gradually improving but not back to baseline. Subdural hematoma -Previous hospitalist spoke with neurosurgery, Dr. Merlyn Albert conservative/nonoperative management -Recommended to hold aspirin for a week  Impaired mobility, generalized weakness -Care dependent at home. -PT eval  recommended SNF.  Hypothyroidism -TSH level was elevated to 20 on 9/9.  Unclear compliance to Synthroid 150  mcg at home. -Patient does not seem to be absorbing oral Synthroid and hence TSH level repeated on 9/17 is further elevated to 70. -9/17, switch to IV Synthroid 75 mcg daily. Recent Labs    08/28/20 1009 09/05/20 0740  TSH 20.018* 70.684*   Macrocytosis  Vitamin B12 deficiency -Vitamin B12 low at 187, MCV elevated to 107.  -IM vitamin B-12 injection given on 9/16 to be repeated weekly.  Also started on oral supplementation however seems to have poor absorption. Recent Labs    08/28/20 1009 08/29/20 0715 09/04/20 0256 09/05/20 0740  MCV  --    < > 106.9* 107.2*  VITAMINB12 187  --   --   --   FOLATE 5.3*  --   --   --    < > = values in this interval not displayed.   Liver cirrhosis without ascites Thrombocytopenia/hypoalbuminemia -noted on CT abd/pelvis in past -Ammonia level was only slightly elevated -Thrombocytopenia and hypoalbuminemia could be secondary to liver cirrhosis. Recent Labs  Lab 08/30/20 0745 08/31/20 0807 09/01/20 0719 09/02/20 0537 09/03/20 0659 09/03/20 1059 09/04/20 0256 09/05/20 0740  PLT 128*   < > 68* 62* 58*  --  64* 71*  INR  --   --   --   --   --  1.1  --   --   ALBUMIN 2.9*  --   --   --   --   --   --   --    < > = values in this interval not displayed.   Chronic abdominal pain -Continue percocet   Hyperlipidemia -Statin  Essential hypertension -clonidine on hold secondary to soft blood pressure. -Blood pressure remains in 100s.  Elevated troponin -Likely secondary to demand ischemia -EKG without concerning ischemic changes -Echocardiogram - EF 70-75%, mod LVH, no WMA  Mobility: PT eval appreciated Code Status:   Code Status: Full Code  Nutritional status: Body mass index is 35.2 kg/m.     Diet Order            Diet Carb Modified Fluid consistency: Thin; Room service appropriate? Yes  Diet effective  now                 DVT prophylaxis: SCDs Start: 08/28/20 1842   Antimicrobials:  IV Rocephin, IV Flagyl Fluid: Continue normal saline at 75 mill per hour Consultants: GI Family Communication:  None at bedside  Status is: Inpatient  Remains inpatient appropriate because continues to have diarrhea, severe electrolyte abnormality, need of IV antibiotics  Dispo: The patient is from: Home              Anticipated d/c is to: SNF               Anticipated d/c date is: More than 3 days              Patient currently is not medically stable to d/c.  Infusions:  . sodium chloride 75 mL/hr at 09/05/20 0300  . cefTRIAXone (ROCEPHIN)  IV Stopped (09/04/20 1123)  . magnesium sulfate bolus IVPB    . metronidazole 500 mg (09/05/20 0517)  . potassium PHOSPHATE IVPB (in mmol)      Scheduled Meds: . Chlorhexidine Gluconate Cloth  6 each Topical Daily  . colestipol  1 g Oral BID  . [START ON 09/11/2020] cyanocobalamin  1,000 mcg Intramuscular Weekly  . folic acid  1 mg Oral Daily  . levothyroxine  75 mcg Intravenous Daily  . loperamide  2 mg Oral TID AC  . mouth rinse  15 mL Mouth Rinse BID  . oxyCODONE  20 mg Oral Q12H  . pantoprazole  40 mg Oral Daily  . phosphorus  500 mg Oral BID  . potassium chloride SA  40 mEq Oral TID  . thiamine  100 mg Oral Daily  . vitamin B-12  1,000 mcg Oral Daily    Antimicrobials: Anti-infectives (From admission, onward)   Start     Dose/Rate Route Frequency Ordered Stop   09/02/20 0900  cefTRIAXone (ROCEPHIN) 2 g in sodium chloride 0.9 % 100 mL IVPB        2 g 200 mL/hr over 30 Minutes Intravenous Every 24 hours 09/02/20 0858     08/30/20 1545  metroNIDAZOLE (FLAGYL) IVPB 500 mg        500 mg 100 mL/hr over 60 Minutes Intravenous Every 8 hours 08/30/20 1540     08/30/20 1030  ciprofloxacin (CIPRO) IVPB 400 mg  Status:  Discontinued        400 mg 200 mL/hr over 60 Minutes Intravenous Every 12 hours 08/30/20 1011 09/02/20 0858      PRN  meds: acetaminophen **OR** acetaminophen, liver oil-zinc oxide, loratadine, ondansetron **OR** ondansetron (ZOFRAN) IV, oxyCODONE-acetaminophen, sodium chloride   Objective: Vitals:   09/04/20 2130 09/05/20 0506  BP: 124/85 117/68  Pulse: 80 79  Resp: 16 14  Temp: 97.8 F (36.6 C) 98 F (36.7 C)  SpO2: 100% 98%    Intake/Output Summary (Last 24 hours) at 09/05/2020 1107 Last data filed at 09/05/2020 0700 Gross per 24 hour  Intake 2134.47 ml  Output --  Net 2134.47 ml   Filed Weights   08/28/20 1649 09/02/20 0458 09/04/20 0637  Weight: 80.3 kg 86.6 kg 87.3 kg   Weight change:  Body mass index is 35.2 kg/m.   Physical Exam: General exam: Appears calm and comfortable.  Continues to have weakness. Skin: No rashes, lesions or ulcers. HEENT: Atraumatic, normocephalic, supple neck, no obvious bleeding Lungs: Clear to auscultation bilaterally. CVS: Regular rate and rhythm, no murmur GI/Abd soft, continues to have mild diffuse abdominal tenderness, nondistended, bowel sound present. Has a fecal bag with brown liquid stool CNS: Alert, awake, oriented to place Psychiatry: Depressed look Extremities: Trace bilateral pedal edema  Data Review: I have personally reviewed the laboratory data and studies available.  Recent Labs  Lab 09/01/20 0719 09/02/20 0537 09/03/20 0659 09/04/20 0256 09/05/20 0740  WBC 5.7 5.4 4.9 4.2 3.6*  NEUTROABS 3.9 3.5 2.8 1.9 1.7  HGB 11.6* 11.8* 12.1 11.8* 10.4*  HCT 35.9* 35.3* 37.2 35.7* 31.3*  MCV 107.2* 107.0* 106.3* 106.9* 107.2*  PLT 68* 62* 58* 64* 71*   Recent Labs  Lab 09/01/20 0719 09/02/20 0537 09/03/20 0659 09/04/20 0256 09/05/20 0740  NA 135 136 136 135 136  K 2.5* 2.8* 4.0 3.0* 2.5*  CL 107 111 111 108 109  CO2 19* 20* 18* 20* 20*  GLUCOSE 150* 137* 160* 118* 124*  BUN 9 8 6* <5* 5*  CREATININE 0.73 0.74 0.61 0.60 0.54  CALCIUM 7.1* 7.2* 7.5* 7.5* 6.9*  MG 1.8 1.7 2.0 1.7 1.6*  PHOS 1.6* 1.8* 1.8* 2.4* 2.2*   F/u labs  ordered  Signed, Terrilee Croak, MD [facility] 09/05/2020

## 2020-09-05 NOTE — Plan of Care (Signed)
  Problem: Education: Goal: Knowledge of General Education information will improve Description: Including pain rating scale, medication(s)/side effects and non-pharmacologic comfort measures Outcome: Progressing   Problem: Health Behavior/Discharge Planning: Goal: Ability to manage health-related needs will improve Outcome: Progressing   Problem: Clinical Measurements: Goal: Ability to maintain clinical measurements within normal limits will improve Outcome: Progressing Goal: Will remain free from infection Outcome: Progressing Goal: Diagnostic test results will improve Outcome: Progressing Goal: Respiratory complications will improve Outcome: Progressing Goal: Cardiovascular complication will be avoided Outcome: Progressing   Problem: Activity: Goal: Risk for activity intolerance will decrease Outcome: Progressing   Problem: Nutrition: Goal: Adequate nutrition will be maintained Outcome: Progressing   Problem: Coping: Goal: Level of anxiety will decrease Outcome: Progressing   Problem: Elimination: Goal: Will not experience complications related to bowel motility Outcome: Progressing Pt continues with liquid stool, rectal tube in place at this time. Goal: Will not experience complications related to urinary retention Outcome: Progressing   Problem: Pain Managment: Goal: General experience of comfort will improve Outcome: Progressing   Problem: Safety: Goal: Ability to remain free from injury will improve Outcome: Progressing   Problem: Skin Integrity: Goal: Risk for impaired skin integrity will decrease Outcome: Progressing -pt's MASD on buttocks less red and less irritated since Desitin use implemented for protection

## 2020-09-05 NOTE — TOC Progression Note (Signed)
Transition of Care [facility]) - Progression Note   Patient Details  Name: Deborah Russo MRN: [id number] Date of Birth: [birth year]  Transition of Care [facility]) CM/SW Jenkins, LCSW Phone Number: 09/05/2020, 12:02 PM  Clinical Narrative: CSW updated by hospitalist that patient will need a few more days. CSW spoke with [place] at [facility]. Lenna Sciara stated she will start insurance authorization today to allow for patient to discharge to the facility on either Sunday or Monday, depending on when patient is medically stable. TOC to follow  Expected Discharge Plan: [place] Barriers to Discharge: Continued Medical Work up  Expected Discharge Plan and Services Expected Discharge Plan: [place] arrangements for the past 2 months: Single Family Home  Readmission Risk Interventions Readmission Risk Prevention Plan 08/31/2020  Transportation Screening Complete  PCP or Specialist Appt within 5-7 Days Complete  Home Care Screening Complete  Medication Review (RN CM) Complete  Some recent data might be hidden

## 2020-09-05 NOTE — Progress Notes (Signed)
CRITICAL VALUE ALERT  Critical Value:  Potassium 2.5  Date & Time Notified: 09/05/20 @ 0825  Provider Notified: MD Dahal  Orders Received/Actions taken: supplement ordered per MD

## 2020-09-05 NOTE — Progress Notes (Addendum)
Subjective:  Patient says she is hungry.  She continues to complain of pain across upper abdomen.  She says she has had this pain for 30 years.  She says she has had diarrhea for at least 30 years.  She has never been told as to the diagnosis.  Current Medications:  Current Facility-Administered Medications:  .  0.9 %  sodium chloride infusion, , Intravenous, Continuous, Dahal, Binaya, MD, Last Rate: 75 mL/hr at 09/05/20 0300, New Bag at 09/05/20 0300 .  acetaminophen (TYLENOL) tablet 650 mg, 650 mg, Oral, Q6H PRN, 650 mg at 09/02/20 1814 **OR** acetaminophen (TYLENOL) suppository 650 mg, 650 mg, Rectal, Q6H PRN, Tat, David, MD .  cefTRIAXone (ROCEPHIN) 2 g in sodium chloride 0.9 % 100 mL IVPB, 2 g, Intravenous, Q24H, Dahal, Binaya, MD, Last Rate: 200 mL/hr at 09/05/20 1133, 2 g at 09/05/20 1133 .  Chlorhexidine Gluconate Cloth 2 % PADS 6 each, 6 each, Topical, Daily, Dahal, Binaya, MD, 6 each at 09/04/20 0945 .  colestipol (COLESTID) tablet 1 g, 1 g, Oral, BID, Montez Morita, Daniel, MD, 1 g at 09/05/20 1148 .  [START ON 09/11/2020] cyanocobalamin ((VITAMIN B-12)) injection 1,000 mcg, 1,000 mcg, Intramuscular, Weekly, Dahal, Binaya, MD .  folic acid (FOLVITE) tablet 1 mg, 1 mg, Oral, Daily, Dahal, Binaya, MD, 1 mg at 09/05/20 1146 .  levothyroxine (SYNTHROID, LEVOTHROID) injection 75 mcg, 75 mcg, Intravenous, Daily, Dahal, Binaya, MD, 75 mcg at 09/05/20 1136 .  liver oil-zinc oxide (DESITIN) 40 % ointment, , Topical, PRN, Terrilee Croak, MD, Given at 09/02/20 1815 .  loperamide (IMODIUM) capsule 2 mg, 2 mg, Oral, TID AC, Rei Contee U, MD, 2 mg at 09/05/20 1147 .  loratadine (CLARITIN) tablet 10 mg, 10 mg, Oral, Daily PRN, Dahal, Binaya, MD, 10 mg at 09/02/20 1814 .  magnesium sulfate IVPB 4 g 100 mL, 4 g, Intravenous, Once, Dahal, Binaya, MD, Last Rate: 50 mL/hr at 09/05/20 1136, 4 g at 09/05/20 1136 .  MEDLINE mouth rinse, 15 mL, Mouth Rinse, BID, Tat, David, MD, 15 mL at 09/04/20  2214 .  metroNIDAZOLE (FLAGYL) IVPB 500 mg, 500 mg, Intravenous, Q8H, Dahal, Binaya, MD, Last Rate: 100 mL/hr at 09/05/20 0517, 500 mg at 09/05/20 0517 .  ondansetron (ZOFRAN) tablet 4 mg, 4 mg, Oral, Q6H PRN, 4 mg at 08/30/20 1034 **OR** ondansetron (ZOFRAN) injection 4 mg, 4 mg, Intravenous, Q6H PRN, Tat, David, MD, 4 mg at 08/30/20 1743 .  oxyCODONE (OXYCONTIN) 12 hr tablet 20 mg, 20 mg, Oral, Q12H, Dahal, Binaya, MD, 20 mg at 09/05/20 0109 .  oxyCODONE-acetaminophen (PERCOCET/ROXICET) 5-325 MG per tablet 1 tablet, 1 tablet, Oral, Q8H PRN, Dahal, Binaya, MD, 1 tablet at 09/05/20 1205 .  pantoprazole (PROTONIX) EC tablet 40 mg, 40 mg, Oral, Daily, Dahal, Binaya, MD, 40 mg at 09/05/20 1147 .  phosphorus (K PHOS NEUTRAL) tablet 500 mg, 500 mg, Oral, BID, Dahal, Binaya, MD, 500 mg at 09/05/20 1147 .  potassium chloride SA (KLOR-CON) CR tablet 40 mEq, 40 mEq, Oral, TID, Dahal, Binaya, MD, 40 mEq at 09/05/20 1148 .  potassium PHOSPHATE 30 mmol in dextrose 5 % 500 mL infusion, 30 mmol, Intravenous, Once, Dahal, Binaya, MD .  sodium chloride (OCEAN) 0.65 % nasal spray 1 spray, 1 spray, Each Nare, PRN, Dahal, Binaya, MD, 1 spray at 09/02/20 1353 .  thiamine tablet 100 mg, 100 mg, Oral, Daily, Tat, David, MD, 100 mg at 09/05/20 1146 .  vitamin B-12 (CYANOCOBALAMIN) tablet 1,000 mcg, 1,000 mcg, Oral, Daily, Dahal, Binaya,  MD, 1,000 mcg at 09/05/20 1147   Objective: Blood pressure 117/68, pulse 79, temperature 98 F (36.7 C), temperature source Oral, resp. rate 14, height 5' 2"  (1.575 m), weight 87.3 kg, SpO2 98 %. Patient is alert and more responsive to questions today. Abdomen is full.  Bowel sounds are hyperactive.  Percussion note is markedly tympanic.  On palpation it is soft she has mild tenderness across upper abdomen.  Liver edge is indistinct. She has trace edema to right foot.  She has increased pigmentation of the skin with wrinkles suggesting resolution of edema.  Labs/studies  Results:   CBC Latest Ref Rng & Units 09/05/2020 09/04/2020 09/03/2020  WBC 4.0 - 10.5 K/uL 3.6(L) 4.2 4.9  Hemoglobin 12.0 - 15.0 g/dL 10.4(L) 11.8(L) 12.1  Hematocrit 36 - 46 % 31.3(L) 35.7(L) 37.2  Platelets 150 - 400 K/uL 71(L) 64(L) 58(L)    CMP Latest Ref Rng & Units 09/05/2020 09/04/2020 09/03/2020  Glucose 70 - 99 mg/dL 124(H) 118(H) 160(H)  BUN 8 - 23 mg/dL 5(L) <5(L) 6(L)  Creatinine 0.44 - 1.00 mg/dL 0.54 0.60 0.61  Sodium 135 - 145 mmol/L 136 135 136  Potassium 3.5 - 5.1 mmol/L 2.5(LL) 3.0(L) 4.0  Chloride 98 - 111 mmol/L 109 108 111  CO2 22 - 32 mmol/L 20(L) 20(L) 18(L)  Calcium 8.9 - 10.3 mg/dL 6.9(L) 7.5(L) 7.5(L)  Total Protein 6.5 - 8.1 g/dL - - -  Total Bilirubin 0.3 - 1.2 mg/dL - - -  Alkaline Phos 38 - 126 U/L - - -  AST 15 - 41 U/L - - -  ALT 0 - 44 U/L - - -    Hepatic Function Latest Ref Rng & Units 08/30/2020 08/29/2020 08/28/2020  Total Protein 6.5 - 8.1 g/dL 6.0(L) 6.2(L) -  Albumin 3.5 - 5.0 g/dL 2.9(L) 3.1(L) -  AST 15 - 41 U/L 16 18 -  ALT 0 - 44 U/L 16 17 -  Alk Phosphatase 38 - 126 U/L 69 73 -  Total Bilirubin 0.3 - 1.2 mg/dL 1.3(H) 1.8(H) 2.1(H)  Bilirubin, Direct 0.0 - 0.2 mg/dL - - 0.7(H)    Ileal and colonic biopsies will be out tomorrow.  Hepatitis A total antibody nonreactive Hepatitis B surface antigen negative. Hepatitis BC IgM antibody nonreactive. Hepatitis B surface antibody and B core total antibody reactive. HCV antibody nonreactive.  Serum IgG [phone])  ANA not completed.  Smooth muscle antibody is 9(normal up to 19)  GI pathogen panel is negative.  Assessment:  #1.  Chronic diarrhea.  GI pathogen panel is negative.  She underwent colonoscopy with ileoscopy by Dr. Jenetta Downer.  She had diffuse colonic edema and flattening to mucosa of terminal ileum.  It remains to be seen if biopsy will provide critical information.  I suspect these changes are due to hypoproteinemia and edema.  Need to rescreen for celiac disease. She has  significant calcification into the origin of SMA and minimal calcification to the origin of celiac trunk.  If her symptom complex remains unexplained would consider CT abdomen and pelvis.  She has B12 deficiency and folate deficiency which would go along with malabsorptive diarrhea.  She is on oral B12 but I am afraid she may not absorb it.  #2.  Hypokalemia secondary to GI losses.  #3.  Thrombocytopenia.  She has fatty liver.  She also has small amount of ascites raising possibility of cirrhosis.  Ascites could also be due to malnutrition and hypoalbuminemia. Elastography may help determine noninvasively if she has cirrhosis.  #  4.  Mental status changes.  CT did reveal very small subdural hematoma at left frontal convexity measuring only 4 mm in maximal thickness.  Suspect mental status changes due to dehydration.   Plan:  Celiac antibody panel. Fecal elastase. Consider parenteral B12 as she may not absorb oral B12. Change loperamide to 2 mg p.o. 3 times daily. Abdominal ultrasound with elastography.      Subjective:  Patient says she does not feel well.  She says she ate some of her breakfast and lunch.  According to nursing staff she ate less than 50%.  She already has had 3 loose stools.  Stools are moderate volume and watery.  Patient complains of epigastric pain. Patient says she does not have any close family members.  Both her children died of drug overdose at age 64 and 32.  She says she has been in assisted living  Current Medications:  Current Facility-Administered Medications:  .  0.9 %  sodium chloride infusion, , Intravenous, Continuous, Dahal, Binaya, MD, Last Rate: 75 mL/hr at 09/05/20 0300, New Bag at 09/05/20 0300 .  acetaminophen (TYLENOL) tablet 650 mg, 650 mg, Oral, Q6H PRN, 650 mg at 09/02/20 1814 **OR** acetaminophen (TYLENOL) suppository 650 mg, 650 mg, Rectal, Q6H PRN, Tat, David, MD .  cefTRIAXone (ROCEPHIN) 2 g in sodium chloride 0.9 % 100 mL IVPB, 2 g,  Intravenous, Q24H, Dahal, Binaya, MD, Last Rate: 200 mL/hr at 09/05/20 1133, 2 g at 09/05/20 1133 .  Chlorhexidine Gluconate Cloth 2 % PADS 6 each, 6 each, Topical, Daily, Dahal, Binaya, MD, 6 each at 09/04/20 0945 .  colestipol (COLESTID) tablet 1 g, 1 g, Oral, BID, Montez Morita, Daniel, MD, 1 g at 09/05/20 1148 .  [START ON 09/11/2020] cyanocobalamin ((VITAMIN B-12)) injection 1,000 mcg, 1,000 mcg, Intramuscular, Weekly, Dahal, Binaya, MD .  folic acid (FOLVITE) tablet 1 mg, 1 mg, Oral, Daily, Dahal, Binaya, MD, 1 mg at 09/05/20 1146 .  levothyroxine (SYNTHROID, LEVOTHROID) injection 75 mcg, 75 mcg, Intravenous, Daily, Dahal, Binaya, MD, 75 mcg at 09/05/20 1136 .  liver oil-zinc oxide (DESITIN) 40 % ointment, , Topical, PRN, Terrilee Croak, MD, Given at 09/02/20 1815 .  loperamide (IMODIUM) capsule 2 mg, 2 mg, Oral, TID AC, Taelyn Nemes U, MD, 2 mg at 09/05/20 1147 .  loratadine (CLARITIN) tablet 10 mg, 10 mg, Oral, Daily PRN, Dahal, Binaya, MD, 10 mg at 09/02/20 1814 .  magnesium sulfate IVPB 4 g 100 mL, 4 g, Intravenous, Once, Dahal, Binaya, MD, Last Rate: 50 mL/hr at 09/05/20 1136, 4 g at 09/05/20 1136 .  MEDLINE mouth rinse, 15 mL, Mouth Rinse, BID, Tat, David, MD, 15 mL at 09/04/20 2214 .  metroNIDAZOLE (FLAGYL) IVPB 500 mg, 500 mg, Intravenous, Q8H, Dahal, Binaya, MD, Last Rate: 100 mL/hr at 09/05/20 0517, 500 mg at 09/05/20 0517 .  ondansetron (ZOFRAN) tablet 4 mg, 4 mg, Oral, Q6H PRN, 4 mg at 08/30/20 1034 **OR** ondansetron (ZOFRAN) injection 4 mg, 4 mg, Intravenous, Q6H PRN, Tat, David, MD, 4 mg at 08/30/20 1743 .  oxyCODONE (OXYCONTIN) 12 hr tablet 20 mg, 20 mg, Oral, Q12H, Dahal, Binaya, MD, 20 mg at 09/05/20 0109 .  oxyCODONE-acetaminophen (PERCOCET/ROXICET) 5-325 MG per tablet 1 tablet, 1 tablet, Oral, Q8H PRN, Dahal, Binaya, MD, 1 tablet at 09/05/20 1205 .  pantoprazole (PROTONIX) EC tablet 40 mg, 40 mg, Oral, Daily, Dahal, Binaya, MD, 40 mg at 09/05/20 1147 .  phosphorus (K  PHOS NEUTRAL) tablet 500 mg, 500 mg, Oral, BID, Dahal, Marlowe Aschoff, MD,  500 mg at 09/05/20 1147 .  potassium chloride SA (KLOR-CON) CR tablet 40 mEq, 40 mEq, Oral, TID, Dahal, Binaya, MD, 40 mEq at 09/05/20 1148 .  potassium PHOSPHATE 30 mmol in dextrose 5 % 500 mL infusion, 30 mmol, Intravenous, Once, Dahal, Binaya, MD .  sodium chloride (OCEAN) 0.65 % nasal spray 1 spray, 1 spray, Each Nare, PRN, Dahal, Binaya, MD, 1 spray at 09/02/20 1353 .  thiamine tablet 100 mg, 100 mg, Oral, Daily, Tat, David, MD, 100 mg at 09/05/20 1146 .  vitamin B-12 (CYANOCOBALAMIN) tablet 1,000 mcg, 1,000 mcg, Oral, Daily, Dahal, Binaya, MD, 1,000 mcg at 09/05/20 1147   Objective: Blood pressure 117/68, pulse 79, temperature 98 F (36.7 C), temperature source Oral, resp. rate 14, height 5' 2"  (1.575 m), weight 87.3 kg, SpO2 98 %. Patient is alert and responds appropriately to questions. She appears to be chronically ill. She has small scab over tip of her nose. Abdomen is protuberant.  Bowel sounds are normal.  On palpation abdomen is soft.  She has mild midepigastric tenderness.  No organomegaly or masses. She has changes of stasis dermatitis involving both legs with increased pigmentation and scaly skin but no edema at present.  Labs/studies Results:   CBC Latest Ref Rng & Units 09/05/2020 09/04/2020 09/03/2020  WBC 4.0 - 10.5 K/uL 3.6(L) 4.2 4.9  Hemoglobin 12.0 - 15.0 g/dL 10.4(L) 11.8(L) 12.1  Hematocrit 36 - 46 % 31.3(L) 35.7(L) 37.2  Platelets 150 - 400 K/uL 71(L) 64(L) 58(L)    CMP Latest Ref Rng & Units 09/05/2020 09/04/2020 09/03/2020  Glucose 70 - 99 mg/dL 124(H) 118(H) 160(H)  BUN 8 - 23 mg/dL 5(L) <5(L) 6(L)  Creatinine 0.44 - 1.00 mg/dL 0.54 0.60 0.61  Sodium 135 - 145 mmol/L 136 135 136  Potassium 3.5 - 5.1 mmol/L 2.5(LL) 3.0(L) 4.0  Chloride 98 - 111 mmol/L 109 108 111  CO2 22 - 32 mmol/L 20(L) 20(L) 18(L)  Calcium 8.9 - 10.3 mg/dL 6.9(L) 7.5(L) 7.5(L)  Total Protein 6.5 - 8.1 g/dL - - -  Total  Bilirubin 0.3 - 1.2 mg/dL - - -  Alkaline Phos 38 - 126 U/L - - -  AST 15 - 41 U/L - - -  ALT 0 - 44 U/L - - -    Hepatic Function Latest Ref Rng & Units 08/30/2020 08/29/2020 08/28/2020  Total Protein 6.5 - 8.1 g/dL 6.0(L) 6.2(L) -  Albumin 3.5 - 5.0 g/dL 2.9(L) 3.1(L) -  AST 15 - 41 U/L 16 18 -  ALT 0 - 44 U/L 16 17 -  Alk Phosphatase 38 - 126 U/L 69 73 -  Total Bilirubin 0.3 - 1.2 mg/dL 1.3(H) 1.8(H) 2.1(H)  Bilirubin, Direct 0.0 - 0.2 mg/dL - - 0.7(H)    Terminal ileal biopsy shows mucosa with hyperemia but no inflammation or granulomas.  Random colonic biopsies show mucosal hyperemia but no granulomas or other abnormalities.  Anal biopsy shows inflamed squamous mucosa with reactive squamous atypia.  PAS stains pending.  Hepatitis A total antibody nonreactive Hepatitis B surface antigen negative. Hepatitis BC IgM antibody nonreactive. Hepatitis B surface antibody and B core total antibody reactive. HCV antibody nonreactive.  Serum IgG [phone]  ANA, smooth muscle antibody, antimitochondrial antibodies pending  GI pathogen panel is negative.  Fecal pancreatic elastase pending. Celiac antibody panel pending.  Ultrasound reviewed with Dr. Thornton Papas.  Very echogenic liver.  Elastography not consistent with cirrhosis.    Assessment:  #1.  Chronic diarrhea.  Work-up so far has been  negative.  Ileal and colonic biopsies are unremarkable.  Etiology felt to be malabsorptive diarrhea.  Celiac antibody panel and pancreatic elastase are pending.  We will proceed with further stool testing to include qualitative fecal fat, electrolytes and stool osmolality.  She has been on loperamide on schedule for 24 hours and so far there is no improvement.  If this pattern continues would consider octreotide.  When all the studies are completed we may also consider trial with antibiotic for possible intestinal bacterial overgrowth which may be primary or secondary.  #2.  Fatty liver disease.   Elastography not consistent with cirrhosis.  Viral markers suggest immunity to hepatitis B.  Test for a and C are negative.  #3.  Hypokalemia secondary to GI losses.  #4.  Mental status changes.  She was felt to have metabolic encephalopathy.  She appears to be back to baseline.  She had very small subdural which appears not to be causing any symptoms.  #5.  B12 and folate deficiency.  Patient now is on parenteral B12.  Doubt that she will absorb oral preparation.  Suspect B12 deficiency secondary to malabsorption rather than other way around.  #6.  Hypothyroidism.  Patient's TSH has increased dramatically.  I agree switching her to IV Synthroid as she is not absorbing oral preparation.  #7.  Anal biopsy reveals reactive changes possibly due to inflammation.  PAS stains pending.  She will need follow-up sigmoidoscopy in the next 3 to 6 months.  Plan:  We will send stool specimen for qualitative fecal fat, stool electrolytes and osmolality. Continue loperamide at current dose for the next 24 hours. Recheck ANA.

## 2020-09-06 DIAGNOSIS — R197 Diarrhea, unspecified: Secondary | ICD-10-CM

## 2020-09-06 DIAGNOSIS — E034 Atrophy of thyroid (acquired): Secondary | ICD-10-CM

## 2020-09-06 LAB — BASIC METABOLIC PANEL
Anion gap: 6 (ref 5–15)
BUN: <5 mg/dL — ABNORMAL LOW (ref 8–23)
CO2: 21 mmol/L — ABNORMAL LOW (ref 22–32)
Calcium: 7.9 mg/dL — ABNORMAL LOW (ref 8.9–10.3)
Chloride: 108 mmol/L (ref 98–111)
Creatinine, Ser: 0.62 mg/dL (ref 0.44–1.00)
GFR calc Af Amer: >60 mL/min (ref >60)
GFR calc non Af Amer: >60 mL/min (ref >60)
Glucose, Bld: 141 mg/dL — ABNORMAL HIGH (ref 70–99)
Potassium: 3.8 mmol/L (ref 3.5–5.1)
Sodium: 135 mmol/L (ref 135–145)

## 2020-09-06 LAB — CBC WITH DIFFERENTIAL/PLATELET
Abs Immature Granulocytes: 0.03 10*3/uL (ref 0.00–0.07)
Basophils Absolute: 0 10*3/uL (ref 0.0–0.1)
Basophils Relative: 1 %
Eosinophils Absolute: 0.1 10*3/uL (ref 0.0–0.5)
Eosinophils Relative: 1 %
HCT: 38 % (ref 36.0–46.0)
Hemoglobin: 12.4 g/dL (ref 12.0–15.0)
Immature Granulocytes: 1 %
Lymphocytes Relative: 36 %
Lymphs Abs: 1.5 10*3/uL (ref 0.7–4.0)
MCH: 34.8 pg — ABNORMAL HIGH (ref 26.0–34.0)
MCHC: 32.6 g/dL (ref 30.0–36.0)
MCV: 106.7 fL — ABNORMAL HIGH (ref 80.0–100.0)
Monocytes Absolute: 0.2 10*3/uL (ref 0.1–1.0)
Monocytes Relative: 5 %
Neutro Abs: 2.4 10*3/uL (ref 1.7–7.7)
Neutrophils Relative %: 56 %
Platelets: 90 10*3/uL — ABNORMAL LOW (ref 150–400)
RBC: 3.56 MIL/uL — ABNORMAL LOW (ref 3.87–5.11)
RDW: 16.1 % — ABNORMAL HIGH (ref 11.5–15.5)
WBC: 4.2 10*3/uL (ref 4.0–10.5)
nRBC: 0 % (ref 0.0–0.2)

## 2020-09-06 LAB — MAGNESIUM: Magnesium: 2.1 mg/dL (ref 1.7–2.4)

## 2020-09-06 LAB — RETICULIN ANTIBODIES, IGA W TITER: Reticulin Ab, IgA: NEGATIVE titer (ref <2.5)

## 2020-09-06 LAB — PHOSPHORUS: Phosphorus: 3.3 mg/dL (ref 2.5–4.6)

## 2020-09-06 NOTE — Progress Notes (Signed)
PROGRESS NOTE  Deborah Russo ZWC:[id number] DOB: 06/15/1961 DOA: 08/28/2020 PCP: Baruch Gouty, FNP (Inactive)  Brief History   Deborah Russo is a 59 y.o. female with PMH of HTN, HLD, prediabetes, stroke, hypothyroidism, vitamin B12 deficiency, thrombocytopenia, GERD, chronic migraine, chronic pain, chronic nausea, vomiting, abdominal pain. Patient presented to the ED on 08/28/2020 with altered mental status.  Caregiver found her confused, lying in her urine and feces. EMS brought her to ED.    In the ED, patient had a low-grade temperature of 99.4, initially hypotensive with systolic blood pressure in 70s, blood pressure improved with IV fluids to 90s. Labs showed potassium level less than 2, WBC normal.  Lactic acid level is elevated to 2.3.  CT of the brain showed a very small subdural hematoma on the left lateral convexity.  Chest x-ray showed mild increased interstitial markings without consolidation.  Urinalysis was negative for pyuria.   Patient was admitted to hospitalist service for further evaluation management. CT abdomen and pelvis obtained subsequently showed an extensive colonic wall thickening throughout, most conspicuous in the transverse colon, and there is fluid in the distal colon and rectum. The terminal ileum may also be involved. Findings are consistent with nonspecific infectious, inflammatory, or ischemic colitis, differential considerations generally including inflammatory bowel disease given distribution and recurrent nature.  Her hospitalization is prolonged because of persistent diarrhea.  GI is involved.  Consultants  . Gastroenterology  Procedures  . None  Antibiotics   Anti-infectives (From admission, onward)   Start     Dose/Rate Route Frequency Ordered Stop   09/02/20 0900  cefTRIAXone (ROCEPHIN) 2 g in sodium chloride 0.9 % 100 mL IVPB        2 g 200 mL/hr over 30 Minutes Intravenous Every 24 hours 09/02/20 0858     08/30/20 1545   metroNIDAZOLE (FLAGYL) IVPB 500 mg        500 mg 100 mL/hr over 60 Minutes Intravenous Every 8 hours 08/30/20 1540     08/30/20 1030  ciprofloxacin (CIPRO) IVPB 400 mg  Status:  Discontinued        400 mg 200 mL/hr over 60 Minutes Intravenous Every 12 hours 08/30/20 1011 09/02/20 0858    .  Marland Kitchen   Subjective  The patient is complaining of epigastric and right upper quadrant pain.  Objective   Vitals:  Vitals:   09/06/20 0501 09/06/20 1500  BP: 125/77 121/83  Pulse: 82 79  Resp: 18 17  Temp: (!) 97 F (36.1 C) 98.3 F (36.8 C)  SpO2: 98% 99%   Exam:  Constitutional:  . The patient is awake, alert, and oriented x 3. No acute distress. Respiratory:  . No increased work of breathing. . No wheezes, rales, or rhonchi . No tactile fremitus Cardiovascular:  . Regular rate and rhythm . No murmurs, ectopy, or gallups. . No lateral PMI. No thrills. Abdomen:  . Abdomen is soft, non-distended . Positive for tenderness in epigastrum and right upper quadrant . No hernias, masses, or organomegaly . Normoactive bowel sounds.  Musculoskeletal:  . No cyanosis, clubbing, or edema Skin:  . No rashes, lesions, ulcers . palpation of skin: no induration or nodules Neurologic:  . CN 2-12 intact . Sensation all 4 extremities intact Psychiatric:  . Mental status o Mood, affect appropriate o Orientation to person, place, time  . judgment and insight appear intact  I have personally reviewed the following:   Today's Data  . Vitals, BMP, CBC   Imaging  .  Abdominal ultrasound/elastrography  Scheduled Meds: . Chlorhexidine Gluconate Cloth  6 each Topical Daily  . colestipol  1 g Oral BID  . [START ON 09/11/2020] cyanocobalamin  1,000 mcg Intramuscular Weekly  . folic acid  1 mg Oral Daily  . levothyroxine  75 mcg Intravenous Daily  . loperamide  2 mg Oral TID AC  . mouth rinse  15 mL Mouth Rinse BID  . oxyCODONE  20 mg Oral Q12H  . pantoprazole  40 mg Oral Daily  . phosphorus   500 mg Oral BID  . potassium chloride SA  40 mEq Oral TID  . thiamine  100 mg Oral Daily  . vitamin B-12  1,000 mcg Oral Daily   Continuous Infusions: . sodium chloride 75 mL/hr at 09/06/20 1519  . cefTRIAXone (ROCEPHIN)  IV 2 g (09/06/20 1102)  . metronidazole 500 mg (09/06/20 1518)    Active Problems:   Hypothyroidism   Hypokalemia   HTN (hypertension)   Polypharmacy   Cirrhosis of liver without ascites (HCC)   Prediabetes   Thrombocytopenia (HCC)   Acute metabolic encephalopathy   LOS: 9 days   A & P  Severe sepsis (POA): Resolved. Due to acute gastroenteritis. Brought in with diarrhea, dehydration, lethargy, confusion. Initial blood pressure was low in 70s resulting in elevated lactic acid 2.3. Blood cultures have had no growth. She is receiving IV antibiotics, IV fluid resuscitation, sepsis parameters and lactic acid level have resolved.   Acute gastroenteritis: CT scan of abdomen pelvis as above showed extensive colonic wall thickening suggestive of colitis. GI pathogen panel and C. difficile negative. Liquid stools are becoming more solid. Continues to make liquidy stool. GI consult appreciated.   9/15, patient underwent colonoscopy.  Noted to have diffusely congested colon.  Biopsies taken. She continues to receive IV Rocephin and IV Flagyl. Continue scheduled colestipol and as needed Imodium.  Epigstric/Right upper quadrant abdominal pain: Check LFT's and Right upper quadrant ultrasound.   Hypokalemia/hypomagnesemia/hypophosphatemia: Due to GI losses. Monitor and supplement as necessary.   Acute metabolic encephalopathy: Pt appears to be nearing baseline. Likely multifactorial including sepsis, dehydration, electrolyte derangements, hypothyroidism, subdural hematoma, elevated TSH and benzodiazepine in urine.  Subdural Hematoma: Previous hospitalist spoke with neurosurgery, Dr. Merlyn Albert conservative/nonoperative management: Recommended to hold aspirin for a  week.  Impaired mobility, generalized weakness: The patient is care dependent at home. PT has evaluated the patient recommended SNF.  Hypothyroidism: TSH 20 on admission, 70.684 on 9/17 after taking 150 mcg daily. Will check T3 and T4. She is currently receiving IV synthroid out of concern that she is not absorbing synthroid enterally.  Macrocytosis /Vitamin B12 deficiency: Vitamin B12 low at 187, MCV elevated to 107. IM vitamin B-12 injection given on 9/16 to be repeated weekly.  Also started on oral supplementation however seems to have poor absorption.  Liver cirrhosis without ascites, but with thrombocytopenia/hypoalbuminemia: Thrombocytopenia and hypoalbuminemia likely secondary to liver cirrhosis.  Chronic abdominal pain: Noted. Continue percocet.  Hyperlipidemia: Continue: Statin  Essential hypertension: Pt is normotensive on no antihypertensives.   Elevated troponin: Likely secondary to demand ischemia. EKG without concerning ischemic changes. Echocardiogram - EF 70-75%, mod LVH, no WMA  I have seen and examined this patient myself. I have spent 35 minutes in her evaluation and care.  Code Status:  Code Status: Full Code  DVT prophylaxis: SCDs Start: 08/28/20 1842 Family Communication: None at bedside Disposition:  Status is: Inpatient  Remains inpatient appropriate because continues to have diarrhea, severe electrolyte abnormality, need of  IV antibiotics  Dispo: The patient is from: Home  Anticipated d/c is to: SNF   Anticipated d/c date is: More than 3 days  Patient currently is not medically stable to d/c.   Romney Compean, DO [facility] Direct contact: see [email or web address]  7PM-7AM contact night coverage as above 09/06/2020, 5:34 PM  LOS: 9 days

## 2020-09-06 NOTE — TOC Progression Note (Signed)
Transition of Care [facility]) - Progression Note   Patient Details  Name: Deborah Russo MRN: [id number] Date of Birth: 1961-02-27  [facility] CM/SW [place], LCSW Phone Number: 09/06/2020, 9:49 AM  Clinical Narrative: Lenna Sciara with [facility] obtained insurance authorization on 09/05/20 with a potential start date of 09/07/20. Patient can discharge to facility on 09/07/20 if medically stable. TOC to follow.  Expected Discharge Plan: Skilled Nursing Facility Barriers to Discharge: Continued Medical Work up  Expected Discharge Plan and Services Expected Discharge Plan: [facility] arrangements for the past 2 months: Single Family Home  Readmission Risk Interventions Readmission Risk Prevention Plan 08/31/2020  Transportation Screening Complete  PCP or Specialist Appt within 5-7 Days Complete  Home Care Screening Complete  Medication Review (RN CM) Complete  Some recent data might be hidden

## 2020-09-06 NOTE — Plan of Care (Signed)

## 2020-09-06 NOTE — Progress Notes (Signed)
Patient states she is hungry.   Breakfast tray in the corner of the room.  Untouched.  Patient states she could not get to it.  Says she needs assistance with meals  Discussed progress with nursing staff. Flexi-Seal changed overnight.  Soft semiformed stool in bag.  Vital signs in last 24 hours: Temp:  [97 F (36.1 C)-98.1 F (36.7 C)] 97 F (36.1 C) (09/18 0501) Pulse Rate:  [74-82] 82 (09/18 0501) Resp:  [18] 18 (09/18 0501) BP: (118-139)/(77-82) 125/77 (09/18 0501) SpO2:  [98 %-99 %] 98 % (09/18 0501) Last BM Date: 09/05/20 (pt has flexiseal and multiple bms)   General: Chronically ill-appearing lady.  She is otherwise alert conversant appears to be in no acute distress.  Abdomen: Nondistended.  Positive bowel sounds.  Very minimal epigastric tenderness to palpation.   Intake/Output from previous day: 09/17 0701 - 09/18 0700 In: 2120.1 [P.O.:240; I.V.:1392.2; IV Piggyback:287.9] Out: 1500 [Urine:1250; Stool:250] Intake/Output this shift: No intake/output data recorded.  Lab Results: Recent Labs    09/04/20 0256 09/05/20 0740 09/06/20 0723  WBC 4.2 3.6* 4.2  HGB 11.8* 10.4* 12.4  HCT 35.7* 31.3* 38.0  PLT 64* 71* 90*   BMET Recent Labs    09/04/20 0256 09/05/20 0740 09/06/20 0723  NA 135 136 135  K 3.0* 2.5* 3.8  CL 108 109 108  CO2 20* 20* 21*  GLUCOSE 118* 124* 141*  BUN <5* 5* <5*  CREATININE 0.60 0.54 0.62  CALCIUM 7.5* 6.9* 7.9*   LFT No results for input(s): PROT, ALBUMIN, AST, ALT, ALKPHOS, BILITOT, BILIDIR, IBILI in the last 72 hours. PT/INR Recent Labs    09/03/20 1059  LABPROT 13.9  INR 1.1   Hepatitis Panel No results for input(s): HEPBSAG, HCVAB, HEPAIGM, HEPBIGM in the last 72 hours. C-Diff No results for input(s): CDIFFTOX in the last 72 hours.   Impression: 59 year old lady with chronic debilitating diarrhea with multiple electrolyte abnormalities. Associated thrombocytopenia and hypoalbuminemia.  She is already had a fairly  extensive evaluation for diarrhea. Diarrhea managed with AC Imodium and Flexi-Seal.  Some formed stool overnight. Colon biopsies nonspecific.  Fecal elastase and stool electrolytes are pending.  She needs more aggressive nutritional support.  Recommendations: Discussed bedside assistance with meals with nursing staff Continue present support. Await completion of stool studies. Her TTG IgA assay less than two.  I do not see a total serum IgA assay in her lab record. For completion sake, we will go ahead and draw a total serum IgA just to make sure that she does not have an isolated IgA deficiency.

## 2020-09-06 NOTE — Plan of Care (Signed)

## 2020-09-07 ENCOUNTER — Inpatient Hospital Stay (HOSPITAL_COMMUNITY): Payer: Medicare HMO

## 2020-09-07 LAB — CBC WITH DIFFERENTIAL/PLATELET
Abs Immature Granulocytes: 0.03 10*3/uL (ref 0.00–0.07)
Basophils Absolute: 0 10*3/uL (ref 0.0–0.1)
Basophils Relative: 1 %
Eosinophils Absolute: 0.1 10*3/uL (ref 0.0–0.5)
Eosinophils Relative: 1 %
HCT: 37.2 % (ref 36.0–46.0)
Hemoglobin: 12.1 g/dL (ref 12.0–15.0)
Immature Granulocytes: 1 %
Lymphocytes Relative: 36 %
Lymphs Abs: 1.7 10*3/uL (ref 0.7–4.0)
MCH: 34.8 pg — ABNORMAL HIGH (ref 26.0–34.0)
MCHC: 32.5 g/dL (ref 30.0–36.0)
MCV: 106.9 fL — ABNORMAL HIGH (ref 80.0–100.0)
Monocytes Absolute: 0.2 10*3/uL (ref 0.1–1.0)
Monocytes Relative: 4 %
Neutro Abs: 2.6 10*3/uL (ref 1.7–7.7)
Neutrophils Relative %: 57 %
Platelets: 103 10*3/uL — ABNORMAL LOW (ref 150–400)
RBC: 3.48 MIL/uL — ABNORMAL LOW (ref 3.87–5.11)
RDW: 16.1 % — ABNORMAL HIGH (ref 11.5–15.5)
WBC: 4.6 10*3/uL (ref 4.0–10.5)
nRBC: 0 % (ref 0.0–0.2)

## 2020-09-07 LAB — BASIC METABOLIC PANEL
Anion gap: 7 (ref 5–15)
BUN: <5 mg/dL — ABNORMAL LOW (ref 8–23)
CO2: 22 mmol/L (ref 22–32)
Calcium: 8.4 mg/dL — ABNORMAL LOW (ref 8.9–10.3)
Chloride: 105 mmol/L (ref 98–111)
Creatinine, Ser: 0.68 mg/dL (ref 0.44–1.00)
GFR calc Af Amer: >60 mL/min (ref >60)
GFR calc non Af Amer: >60 mL/min (ref >60)
Glucose, Bld: 131 mg/dL — ABNORMAL HIGH (ref 70–99)
Potassium: 4.2 mmol/L (ref 3.5–5.1)
Sodium: 134 mmol/L — ABNORMAL LOW (ref 135–145)

## 2020-09-07 LAB — MAGNESIUM: Magnesium: 1.8 mg/dL (ref 1.7–2.4)

## 2020-09-07 LAB — TSH: TSH: 78.985 u[IU]/mL — ABNORMAL HIGH (ref 0.350–4.500)

## 2020-09-07 LAB — T4, FREE: Free T4: 0.48 ng/dL — ABNORMAL LOW (ref 0.61–1.12)

## 2020-09-07 NOTE — Progress Notes (Signed)
Midlevel notified of bp 147/104. No new orders at this time. Awaiting response.

## 2020-09-07 NOTE — Progress Notes (Addendum)
Patient tells me this morning her diarrhea is better and she is eating.  Wants to go to the nursing home.  Multiple nursing staff members tell me she is not eating well and has refused to eat.  She continues to have loose stools she is removed rectal tube multiple times.  Stool for electrolytes and fecal fat have not been collected.  Reportedly, stool for fecal elastase has been submitted  Vital signs in last 24 hours: Temp:  [97.4 F (36.3 C)-98.3 F (36.8 C)] 97.4 F (36.3 C) (09/19 0500) Pulse Rate:  [77-81] 77 (09/19 0500) Resp:  [16-17] 16 (09/19 0500) BP: (121-140)/(82-95) 140/95 (09/19 0500) SpO2:  [98 %-99 %] 98 % (09/19 0500) Last BM Date: 09/07/20 General: Frail, chronically ill appearing; conversant appears in no acute distress Abdomen: Nondistended.  Positive bowel sounds.  Very minimal diffuse abdominal tenderness without mass.   Extremities:  Without clubbing or edema.    Intake/Output from previous day: 09/18 0701 - 09/19 0700 In: 720 [P.O.:720] Out: 2000 [Urine:2000] Intake/Output this shift: No intake/output data recorded.  Lab Results: Recent Labs    09/05/20 0740 09/06/20 0723 09/07/20 0811  WBC 3.6* 4.2 4.6  HGB 10.4* 12.4 12.1  HCT 31.3* 38.0 37.2  PLT 71* 90* 103*   BMET Recent Labs    09/05/20 0740 09/06/20 0723 09/07/20 0811  NA 136 135 134*  K 2.5* 3.8 4.2  CL 109 108 105  CO2 20* 21* 22  GLUCOSE 124* 141* 131*  BUN 5* <5* <5*  CREATININE 0.54 0.62 0.68  CALCIUM 6.9* 7.9* 8.4*    Impression: 59 year old lady with chronic diarrhea and associated electrolyte abnormalities which have improved with treatment.  Complete stool evaluation impeded by patient's lack of cooperation.  Oral intake appears to be relatively poor at this point  Elastography results suggest patient does not have advanced chronic liver disease.    Recommendations: Continue supportive measures.  Continue Imodium.              Follow-up on total IgA assay to  complete screening for celiac                 Disease as well as fecal elastase.    Overall, prognosis somewhat guarded.

## 2020-09-07 NOTE — Progress Notes (Signed)
PROGRESS NOTE  Deborah Russo DOB: 1961/11/12 DOA: 08/28/2020 PCP: Baruch Gouty, FNP (Inactive)  Brief History   Deborah Russo is a 59 y.o. female with PMH of HTN, HLD, prediabetes, stroke, hypothyroidism, vitamin B12 deficiency, thrombocytopenia, GERD, chronic migraine, chronic pain, chronic nausea, vomiting, abdominal pain. Patient presented to the ED on 08/28/2020 with altered mental status.  Caregiver found her confused, lying in her urine and feces. EMS brought her to ED.    In the ED, patient had a low-grade temperature of 99.4, initially hypotensive with systolic blood pressure in 70s, blood pressure improved with IV fluids to 90s. Labs showed potassium level less than 2, WBC normal.  Lactic acid level is elevated to 2.3.  CT of the brain showed a very small subdural hematoma on the left lateral convexity. Chest x-ray showed mild increased interstitial markings without consolidation. Urinalysis was negative for pyuria.   Patient was admitted to hospitalist service for further evaluation management.  CT abdomen and pelvis obtained subsequently showed an extensive colonic wall thickening throughout, most conspicuous in the transverse colon, and there is fluid in the distal colon and rectum. The terminal ileum may also be involved. Findings are consistent with nonspecific infectious, inflammatory, or ischemic colitis, differential considerations generally including inflammatory bowel disease given distribution and recurrent nature.  Her hospitalization is prolonged because of persistent diarrhea. GI is involved.  Consultants  . Gastroenterology  Procedures  . None  Antibiotics   Anti-infectives (From admission, onward)   Start     Dose/Rate Route Frequency Ordered Stop   09/02/20 0900  cefTRIAXone (ROCEPHIN) 2 g in sodium chloride 0.9 % 100 mL IVPB        2 g 200 mL/hr over 30 Minutes Intravenous Every 24 hours 09/02/20 0858     08/30/20 1545   metroNIDAZOLE (FLAGYL) IVPB 500 mg        500 mg 100 mL/hr over 60 Minutes Intravenous Every 8 hours 08/30/20 1540     08/30/20 1030  ciprofloxacin (CIPRO) IVPB 400 mg  Status:  Discontinued        400 mg 200 mL/hr over 60 Minutes Intravenous Every 12 hours 08/30/20 1011 09/02/20 0858     .   Subjective  The patient is resting comfortably. No new complaints.  Objective   Vitals:  Vitals:   09/06/20 2100 09/07/20 0500  BP: 137/82 (!) 140/95  Pulse: 81 77  Resp: 16 16  Temp: 98.2 F (36.8 C) (!) 97.4 F (36.3 C)  SpO2: 98% 98%   Exam:  Constitutional:  . The patient is awake, alert, and oriented x 3. No acute distress. Respiratory:  . No increased work of breathing. . No wheezes, rales, or rhonchi . No tactile fremitus Cardiovascular:  . Regular rate and rhythm . No murmurs, ectopy, or gallups. . No lateral PMI. No thrills. Abdomen:  . Abdomen is soft, non-distended . Non-tender . No hernias, masses, or organomegaly . Normoactive bowel sounds.  Musculoskeletal:  . No cyanosis, clubbing, or edema Skin:  . No rashes, lesions, ulcers . palpation of skin: no induration or nodules Neurologic:  . CN 2-12 intact . Sensation all 4 extremities intact Psychiatric:  . Mental status o Mood, affect appropriate o Orientation to person, place, time  . judgment and insight appear intact  I have personally reviewed the following:   Today's Data  . Vitals, BMP, CBC   Imaging  . Abdominal ultrasound/elastrography  Scheduled Meds: . Chlorhexidine Gluconate Cloth  6 each  Topical Daily  . colestipol  1 g Oral BID  . [START ON 09/11/2020] cyanocobalamin  1,000 mcg Intramuscular Weekly  . folic acid  1 mg Oral Daily  . levothyroxine  75 mcg Intravenous Daily  . loperamide  2 mg Oral TID AC  . mouth rinse  15 mL Mouth Rinse BID  . oxyCODONE  20 mg Oral Q12H  . pantoprazole  40 mg Oral Daily  . phosphorus  500 mg Oral BID  . potassium chloride SA  40 mEq Oral TID  .  thiamine  100 mg Oral Daily  . vitamin B-12  1,000 mcg Oral Daily   Continuous Infusions: . sodium chloride 75 mL/hr at 09/06/20 1519  . cefTRIAXone (ROCEPHIN)  IV 2 g (09/07/20 0858)  . metronidazole 500 mg (09/07/20 0503)    Active Problems:   Hypothyroidism   Hypokalemia   HTN (hypertension)   Polypharmacy   Cirrhosis of liver without ascites (HCC)   Prediabetes   Thrombocytopenia (HCC)   Acute metabolic encephalopathy   LOS: 10 days   A & P  Severe sepsis (POA): Resolved. Due to acute gastroenteritis. Brought in with diarrhea, dehydration, lethargy, confusion. Initial blood pressure was low in 70s resulting in elevated lactic acid 2.3. Blood cultures have had no growth. She is receiving IV antibiotics, IV fluid resuscitation, sepsis parameters and lactic acid level have resolved.   Acute gastroenteritis: CT scan of abdomen pelvis as above showed extensive colonic wall thickening suggestive of colitis. GI pathogen panel and C. difficile negative. Liquid stools are becoming more solid. Continues to make liquidy stool. GI consult appreciated.   9/15, patient underwent colonoscopy.  Noted to have diffusely congested colon.  Biopsies taken. She continues to receive IV Rocephin and IV Flagyl. Continue scheduled colestipol and as needed Imodium. Even so, she has had 5 stools on 09/06/2020.  Epigstric/Right upper quadrant abdominal pain: Check LFT's and Right upper quadrant ultrasound.   Hypokalemia/hypomagnesemia/hypophosphatemia: Due to GI losses. Monitor and supplement as necessary.   Acute metabolic encephalopathy: Pt appears to be nearing baseline. Likely multifactorial including sepsis, dehydration, electrolyte derangements, hypothyroidism, subdural hematoma, elevated TSH and benzodiazepine in urine.  Subdural Hematoma: Previous hospitalist spoke with neurosurgery, Dr. Ellene Route. Recommendation is for continued conservative/nonoperative management: Recommended to hold aspirin for a  week.  Impaired mobility, generalized weakness: The patient is care dependent at home. PT has evaluated the patient recommended SNF.  Hypothyroidism: TSH 20 on admission, 70.684 on 9/17 after taking 150 mcg daily.TSH 78.985 today.  Will check T3 and T4. She is currently receiving IV synthroid out of concern that she is not absorbing synthroid enterally.  Macrocytosis /Vitamin B12 deficiency: Vitamin B12 low at 187, MCV elevated to 107. IM vitamin B-12 injection given on 9/16 to be repeated weekly.  Also started on oral supplementation however seems to have poor absorption.  Liver cirrhosis without ascites, but with thrombocytopenia/hypoalbuminemia: Thrombocytopenia and hypoalbuminemia likely secondary to liver cirrhosis.  Chronic abdominal pain: Noted. Continue percocet.  Hyperlipidemia: Continue: Statin  Essential hypertension: Pt is normotensive on no antihypertensives.   Elevated troponin: Likely secondary to demand ischemia. EKG without concerning ischemic changes. Echocardiogram - EF 70-75%, mod LVH, no WMA  I have seen and examined this patient myself. I have spent 32 minutes in her evaluation and care.  Code Status:Code Status: Full Code  DVT prophylaxis: SCDs Start: 08/28/20 1842 Family Communication: None at bedside Disposition: SNF Status is: Inpatient  Remains inpatient appropriate because continues to have diarrhea, severe electrolyte abnormality,  need of IV antibiotics  Dispo: The patient is from: Home  Anticipated d/c is to: SNF   Anticipated d/c date is: More than 2 days  Patient currently is not medically stable to d/c.   Tassie Pollett, DO [facility] Direct contact: see [email or web address]  7PM-7AM contact night coverage as above 09/07/2020,11:57 PM  LOS: 9 days

## 2020-09-08 ENCOUNTER — Other Ambulatory Visit: Payer: Self-pay

## 2020-09-08 LAB — CBC WITH DIFFERENTIAL/PLATELET
Abs Immature Granulocytes: 0.02 10*3/uL (ref 0.00–0.07)
Basophils Absolute: 0 10*3/uL (ref 0.0–0.1)
Basophils Relative: 1 %
Eosinophils Absolute: 0.1 10*3/uL (ref 0.0–0.5)
Eosinophils Relative: 2 %
HCT: 33.1 % — ABNORMAL LOW (ref 36.0–46.0)
Hemoglobin: 10.8 g/dL — ABNORMAL LOW (ref 12.0–15.0)
Immature Granulocytes: 1 %
Lymphocytes Relative: 39 %
Lymphs Abs: 1.6 10*3/uL (ref 0.7–4.0)
MCH: 35.5 pg — ABNORMAL HIGH (ref 26.0–34.0)
MCHC: 32.6 g/dL (ref 30.0–36.0)
MCV: 108.9 fL — ABNORMAL HIGH (ref 80.0–100.0)
Monocytes Absolute: 0.2 10*3/uL (ref 0.1–1.0)
Monocytes Relative: 4 %
Neutro Abs: 2.2 10*3/uL (ref 1.7–7.7)
Neutrophils Relative %: 53 %
Platelets: 100 10*3/uL — ABNORMAL LOW (ref 150–400)
RBC: 3.04 MIL/uL — ABNORMAL LOW (ref 3.87–5.11)
RDW: 16 % — ABNORMAL HIGH (ref 11.5–15.5)
WBC: 4 10*3/uL (ref 4.0–10.5)
nRBC: 0 % (ref 0.0–0.2)

## 2020-09-08 LAB — ANTINUCLEAR ANTIBODIES, IFA: ANA Ab, IFA: NEGATIVE

## 2020-09-08 LAB — BASIC METABOLIC PANEL
Anion gap: 7 (ref 5–15)
BUN: <5 mg/dL — ABNORMAL LOW (ref 8–23)
CO2: 19 mmol/L — ABNORMAL LOW (ref 22–32)
Calcium: 7.2 mg/dL — ABNORMAL LOW (ref 8.9–10.3)
Chloride: 110 mmol/L (ref 98–111)
Creatinine, Ser: 0.64 mg/dL (ref 0.44–1.00)
GFR calc Af Amer: >60 mL/min (ref >60)
GFR calc non Af Amer: >60 mL/min (ref >60)
Glucose, Bld: 101 mg/dL — ABNORMAL HIGH (ref 70–99)
Potassium: 3.6 mmol/L (ref 3.5–5.1)
Sodium: 136 mmol/L (ref 135–145)

## 2020-09-08 LAB — IGG, IGA, IGM
IgA: 406 mg/dL — ABNORMAL HIGH (ref 87–352)
IgG (Immunoglobin G), Serum: 1199 mg/dL (ref 586–1602)
IgM (Immunoglobulin M), Srm: 499 mg/dL — ABNORMAL HIGH (ref 26–217)

## 2020-09-08 LAB — IGA: IgA: 398 mg/dL — ABNORMAL HIGH (ref 87–352)

## 2020-09-08 LAB — OSMOLALITY, STOOL: Osmolality,Stl: 278 mOsmol/kg

## 2020-09-08 LAB — MAGNESIUM: Magnesium: 1.4 mg/dL — ABNORMAL LOW (ref 1.7–2.4)

## 2020-09-08 LAB — T3, FREE: T3, Free: 1 pg/mL — ABNORMAL LOW (ref 2.0–4.4)

## 2020-09-08 LAB — SURGICAL PATHOLOGY

## 2020-09-08 MED ORDER — ZINC OXIDE 40 % EX OINT: TOPICAL_OINTMENT | CUTANEOUS | 0 refills | Status: DC | PRN

## 2020-09-08 MED ORDER — CYANOCOBALAMIN 1000 MCG PO TABS: 1000.0000 ug | ORAL_TABLET | Freq: Every day | ORAL | 0 refills | Status: AC

## 2020-09-08 MED ORDER — COLESTIPOL HCL 1 G PO TABS: 1.0000 g | ORAL_TABLET | Freq: Two times a day (BID) | ORAL | 0 refills | Status: DC

## 2020-09-08 MED ORDER — DIAZEPAM 5 MG PO TABS: 5.0000 mg | ORAL_TABLET | Freq: Three times a day (TID) | ORAL | 0 refills | Status: DC | PRN

## 2020-09-08 MED ORDER — OXYCODONE HCL ER 20 MG PO T12A
20.0000 mg | EXTENDED_RELEASE_TABLET | Freq: Two times a day (BID) | ORAL | 0 refills | Status: DC
Start: 2020-09-08 — End: 2021-07-13

## 2020-09-08 MED ORDER — CYANOCOBALAMIN 1000 MCG/ML IJ SOLN: 1000.0000 ug | INTRAMUSCULAR | 0 refills | Status: DC

## 2020-09-08 MED ORDER — LOPERAMIDE HCL 2 MG PO CAPS: 2.0000 mg | ORAL_CAPSULE | Freq: Four times a day (QID) | ORAL | 0 refills | Status: DC

## 2020-09-08 MED ORDER — K PHOS MONO-SOD PHOS DI & MONO 155-852-130 MG PO TABS
500.0000 mg | ORAL_TABLET | Freq: Two times a day (BID) | ORAL | 0 refills | Status: DC
Start: 2020-09-08 — End: 2021-04-09

## 2020-09-08 MED ORDER — THIAMINE HCL 100 MG PO TABS
100.0000 mg | ORAL_TABLET | Freq: Every day | ORAL | 0 refills | Status: AC
Start: 2020-09-08 — End: ?

## 2020-09-08 MED ORDER — PANTOPRAZOLE SODIUM 40 MG PO TBEC
40.0000 mg | DELAYED_RELEASE_TABLET | Freq: Every day | ORAL | 0 refills | Status: DC
Start: 2020-09-09 — End: 2021-07-13

## 2020-09-08 MED ORDER — POTASSIUM CHLORIDE CRYS ER 20 MEQ PO TBCR: 40.0000 meq | EXTENDED_RELEASE_TABLET | Freq: Two times a day (BID) | ORAL | 0 refills | Status: DC

## 2020-09-08 MED ORDER — MAGNESIUM SULFATE 4 GM/100ML IV SOLN
4.0000 g | Freq: Once | INTRAVENOUS | Status: AC
  Administered 2020-09-08: 4 g via INTRAVENOUS
  Filled 2020-09-08: qty 100

## 2020-09-08 MED ORDER — FOLIC ACID 1 MG PO TABS: 1.0000 mg | ORAL_TABLET | Freq: Every day | ORAL | 0 refills | Status: AC

## 2020-09-08 MED ORDER — OXYCODONE-ACETAMINOPHEN 5-325 MG PO TABS
1.0000 | ORAL_TABLET | Freq: Three times a day (TID) | ORAL | 0 refills | Status: DC | PRN
Start: 2020-09-08 — End: 2021-07-13

## 2020-09-08 MED ORDER — LOPERAMIDE HCL 2 MG PO CAPS
2.0000 mg | ORAL_CAPSULE | Freq: Four times a day (QID) | ORAL | Status: DC
  Administered 2020-09-08 – 2020-09-09 (×4): 2 mg via ORAL
  Filled 2020-09-08 (×4): qty 1

## 2020-09-08 MED ORDER — SALINE SPRAY 0.65 % NA SOLN: 1.0000 | NASAL | 0 refills | Status: DC | PRN

## 2020-09-08 NOTE — TOC Progression Note (Signed)
Transition of Care [facility]) - Progression Note   Patient Details  Name: Deborah Russo MRN: [id number] Date of Birth: Aug 21, 1961  Transition of Care [facility]) CM/SW [place], LCSW Phone Number: 09/08/2020, 2:02 PM  Clinical Narrative: Patient not ready to discharge per GI recommendations. CSW updated Melissa in admissions with [facility]. TOC to follow.  Expected Discharge Plan: [place] Barriers to Discharge: Continued Medical Work up  Expected Discharge Plan and Services Expected Discharge Plan: [place] arrangements for the past 2 months: Single Family Home Expected Discharge Date: 09/09/20                 Readmission Risk Interventions Readmission Risk Prevention Plan 08/31/2020  Transportation Screening Complete  PCP or Specialist Appt within 5-7 Days Complete  Home Care Screening Complete  Medication Review (RN CM) Complete  Some recent data might be hidden

## 2020-09-08 NOTE — Progress Notes (Signed)
PT Cancellation Note  Patient Details Name: Deborah Russo MRN: [id number] DOB: 25-Mar-1961   Cancelled Treatment:    Reason Eval/Treat Not Completed: Patient declined, no reason specified. Upon arrival, pt laying in bed with eyes closed repeating "please help me". Therapist introduced self and offers assistance, pt declines therapy and states "I'm wet all over". Pt requests for nursing to assist her; nurse tech notified.  Talbot Grumbling PT, DPT 09/08/20, 2:17 PM [phone]

## 2020-09-08 NOTE — Progress Notes (Signed)
PROGRESS NOTE  Deborah Russo BZM:[id number] DOB: 1961/10/27 DOA: 08/28/2020 PCP: Baruch Gouty, FNP (Inactive)  Brief History   Deborah Russo is a 59 y.o. female with PMH of HTN, HLD, prediabetes, stroke, hypothyroidism, vitamin B12 deficiency, thrombocytopenia, GERD, chronic migraine, chronic pain, chronic nausea, vomiting, abdominal pain. Patient presented to the ED on 08/28/2020 with altered mental status.  Caregiver found her confused, lying in her urine and feces. EMS brought her to ED.    In the ED, patient had a low-grade temperature of 99.4, initially hypotensive with systolic blood pressure in 70s, blood pressure improved with IV fluids to 90s. Labs showed potassium level less than 2, WBC normal.  Lactic acid level is elevated to 2.3.  CT of the brain showed a very small subdural hematoma on the left lateral convexity. Chest x-ray showed mild increased interstitial markings without consolidation. Urinalysis was negative for pyuria.   Patient was admitted to hospitalist service for further evaluation management.  CT abdomen and pelvis obtained subsequently showed an extensive colonic wall thickening throughout, most conspicuous in the transverse colon, and there is fluid in the distal colon and rectum. The terminal ileum may also be involved. Findings are consistent with nonspecific infectious, inflammatory, or ischemic colitis, differential considerations generally including inflammatory bowel disease given distribution and recurrent nature.  Her hospitalization is prolonged because of persistent diarrhea. GI is involved and has ordered fecal elastase, celiac antibody, and abdominal ultrasound with elastography. Biopsies remain pending.  Consultants  . Gastroenterology  Procedures  . None  Antibiotics   Anti-infectives (From admission, onward)   Start     Dose/Rate Route Frequency Ordered Stop   09/02/20 0900  cefTRIAXone (ROCEPHIN) 2 g in sodium chloride 0.9 %  100 mL IVPB        2 g 200 mL/hr over 30 Minutes Intravenous Every 24 hours 09/02/20 0858     08/30/20 1545  metroNIDAZOLE (FLAGYL) IVPB 500 mg        500 mg 100 mL/hr over 60 Minutes Intravenous Every 8 hours 08/30/20 1540     08/30/20 1030  ciprofloxacin (CIPRO) IVPB 400 mg  Status:  Discontinued        400 mg 200 mL/hr over 60 Minutes Intravenous Every 12 hours 08/30/20 1011 09/02/20 0858     .   Subjective  The patient is resting comfortably. No new complaints.  Objective   Vitals:  Vitals:   09/07/20 2300 09/08/20 0612  BP: (!) 147/104 121/78  Pulse: 73 89  Resp: 20 16  Temp: 97.7 F (36.5 C) 98.5 F (36.9 C)  SpO2: 100% 100%   Exam:  Constitutional:  . The patient is awake, alert, and oriented x 3. No acute distress. Respiratory:  . No increased work of breathing. . No wheezes, rales, or rhonchi . No tactile fremitus Cardiovascular:  . Regular rate and rhythm . No murmurs, ectopy, or gallups. . No lateral PMI. No thrills. Abdomen:  . Abdomen is soft, non-distended . Non-tender . No hernias, masses, or organomegaly . Normoactive bowel sounds.  Musculoskeletal:  . No cyanosis, clubbing, or edema Skin:  . No rashes, lesions, ulcers . palpation of skin: no induration or nodules Neurologic:  . CN 2-12 intact . Sensation all 4 extremities intact Psychiatric:  . Mental status o Mood, affect appropriate o Orientation to person, place, time  . judgment and insight appear intact  I have personally reviewed the following:   Today's Data  . Vitals, BMP, CBC  Imaging  .  Abdominal ultrasound/elastrography - pending  Scheduled Meds: . Chlorhexidine Gluconate Cloth  6 each Topical Daily  . colestipol  1 g Oral BID  . [START ON 09/11/2020] cyanocobalamin  1,000 mcg Intramuscular Weekly  . folic acid  1 mg Oral Daily  . levothyroxine  75 mcg Intravenous Daily  . loperamide  2 mg Oral QID  . mouth rinse  15 mL Mouth Rinse BID  . oxyCODONE  20 mg Oral Q12H   . pantoprazole  40 mg Oral Daily  . phosphorus  500 mg Oral BID  . potassium chloride SA  40 mEq Oral TID  . thiamine  100 mg Oral Daily  . vitamin B-12  1,000 mcg Oral Daily   Continuous Infusions: . sodium chloride 75 mL/hr at 09/07/20 2247  . cefTRIAXone (ROCEPHIN)  IV 2 g (09/08/20 0817)  . metronidazole 500 mg (09/08/20 0615)    Active Problems:   Hypothyroidism   Hypokalemia   HTN (hypertension)   Polypharmacy   Cirrhosis of liver without ascites (HCC)   Prediabetes   Thrombocytopenia (HCC)   Acute metabolic encephalopathy   LOS: 11 days   A & P  Severe sepsis (POA): Resolved. Due to acute gastroenteritis. Brought in with diarrhea, dehydration, lethargy, confusion. Initial blood pressure was low in 70s resulting in elevated lactic acid 2.3. Blood cultures have had no growth. She is receiving IV antibiotics, IV fluid resuscitation, sepsis parameters and lactic acid level have resolved.   Acute gastroenteritis: CT scan of abdomen pelvis as above showed extensive colonic wall thickening suggestive of colitis. GI pathogen panel and C. difficile negative. Liquid stools are becoming more solid. Continues to make liquidy stool. GI consult appreciated.   9/15, patient underwent colonoscopy.  Noted to have diffusely congested colon.  Biopsies taken. She continues to receive IV Rocephin and IV Flagyl. Continue scheduled colestipol and as needed Imodium. Even so, she has had 3 09/07/2020. GI posits possible etiologies such as bowel edema, hypoproteinemia, celiac disease, and malabsorption. He has ordered celiac antibodies, fecal elastase, and abdominal ultrasound with elastrography.  Epigstric/Right upper quadrant abdominal pain: Check LFT's and Right upper quadrant ultrasound.   Hypokalemia/hypomagnesemia/hypophosphatemia: Due to GI losses. Monitor and supplement as necessary.   Acute metabolic encephalopathy: Pt appears to be nearing baseline. Likely multifactorial including sepsis,  dehydration, electrolyte derangements, hypothyroidism, subdural hematoma, elevated TSH and benzodiazepine in urine.  Subdural Hematoma: Previous hospitalist spoke with neurosurgery, Dr. Ellene Route. Recommendation is for continued conservative/nonoperative management: Recommended to hold aspirin for a week.  Impaired mobility, generalized weakness: The patient is care dependent at home. PT has evaluated the patient recommended SNF.  Hypothyroidism: TSH 20 on admission, 70.684 on 9/17 after taking 150 mcg daily.TSH 78.985 today.  Will check T3 and T4. She is currently receiving IV synthroid out of concern that she is not absorbing synthroid enterally.  Macrocytosis /Vitamin B12 deficiency: Vitamin B12 low at 187, MCV elevated to 107. IM vitamin B-12 injection given on 9/16 to be repeated weekly.  Also started on oral supplementation however seems to have poor absorption.  Liver cirrhosis without ascites, but with thrombocytopenia/hypoalbuminemia: Thrombocytopenia and hypoalbuminemia likely secondary to liver cirrhosis.  Chronic abdominal pain: Noted. Continue percocet.  Hyperlipidemia: Continue: Statin  Essential hypertension: Pt is normotensive on no antihypertensives.   Elevated troponin: Likely secondary to demand ischemia. EKG without concerning ischemic changes. Echocardiogram - EF 70-75%, mod LVH, no WMA  I have seen and examined this patient myself. I have spent 38 minutes in her evaluation and  care.  Code Status:Code Status: Full Code  DVT prophylaxis: SCDs Start: 08/28/20 1842 Family Communication: None at bedside Disposition: SNF Status is: Inpatient  Remains inpatient appropriate because continues to have diarrhea, severe electrolyte abnormality, need of IV antibiotics  Dispo: The patient is from: Home  Anticipated d/c is to: SNF   Anticipated d/c date is: 2 days  Patient currently is not medically stable to d/c.   Sidney Silberman,  DO [facility] Direct contact: see [email or web address]  7PM-7AM contact night coverage as above 09/08/2020,2:53 PM  LOS: 9 days

## 2020-09-08 NOTE — Progress Notes (Addendum)
Subjective:  Patient wants to go back to nursing home.  She denies nausea or vomiting.  According to nursing staff she has had 2 bowel movements today.  Oral intake is poor.  Patient continues to complain of epigastric pain.  Current Medications:  Current Facility-Administered Medications:  .  0.9 %  sodium chloride infusion, , Intravenous, Continuous, Dahal, Binaya, MD, Last Rate: 75 mL/hr at 09/07/20 2247, New Bag at 09/07/20 2247 .  acetaminophen (TYLENOL) tablet 650 mg, 650 mg, Oral, Q6H PRN, 650 mg at 09/02/20 1814 **OR** acetaminophen (TYLENOL) suppository 650 mg, 650 mg, Rectal, Q6H PRN, Tat, David, MD .  cefTRIAXone (ROCEPHIN) 2 g in sodium chloride 0.9 % 100 mL IVPB, 2 g, Intravenous, Q24H, Dahal, Binaya, MD, Last Rate: 200 mL/hr at 09/08/20 0817, 2 g at 09/08/20 0817 .  Chlorhexidine Gluconate Cloth 2 % PADS 6 each, 6 each, Topical, Daily, Dahal, Marlowe Aschoff, MD, 6 each at 09/08/20 0819 .  colestipol (COLESTID) tablet 1 g, 1 g, Oral, BID, Montez Morita, Daniel, MD, 1 g at 09/08/20 0818 .  [START ON 09/11/2020] cyanocobalamin ((VITAMIN B-12)) injection 1,000 mcg, 1,000 mcg, Intramuscular, Weekly, Dahal, Binaya, MD .  folic acid (FOLVITE) tablet 1 mg, 1 mg, Oral, Daily, Dahal, Binaya, MD, 1 mg at 09/08/20 0817 .  levothyroxine (SYNTHROID, LEVOTHROID) injection 75 mcg, 75 mcg, Intravenous, Daily, Dahal, Binaya, MD, 75 mcg at 09/08/20 0818 .  liver oil-zinc oxide (DESITIN) 40 % ointment, , Topical, PRN, Terrilee Croak, MD, Given at 09/02/20 1815 .  loperamide (IMODIUM) capsule 2 mg, 2 mg, Oral, TID AC, Dorena Dorfman U, MD, 2 mg at 09/08/20 0818 .  loratadine (CLARITIN) tablet 10 mg, 10 mg, Oral, Daily PRN, Dahal, Binaya, MD, 10 mg at 09/02/20 1814 .  MEDLINE mouth rinse, 15 mL, Mouth Rinse, BID, Tat, David, MD, 15 mL at 09/08/20 0819 .  metroNIDAZOLE (FLAGYL) IVPB 500 mg, 500 mg, Intravenous, Q8H, Dahal, Binaya, MD, Last Rate: 100 mL/hr at 09/08/20 0615, 500 mg at 09/08/20 0615 .   ondansetron (ZOFRAN) tablet 4 mg, 4 mg, Oral, Q6H PRN, 4 mg at 08/30/20 1034 **OR** ondansetron (ZOFRAN) injection 4 mg, 4 mg, Intravenous, Q6H PRN, Tat, David, MD, 4 mg at 08/30/20 1743 .  oxyCODONE (OXYCONTIN) 12 hr tablet 20 mg, 20 mg, Oral, Q12H, Dahal, Binaya, MD, 20 mg at 09/08/20 0032 .  oxyCODONE-acetaminophen (PERCOCET/ROXICET) 5-325 MG per tablet 1 tablet, 1 tablet, Oral, Q8H PRN, Terrilee Croak, MD, 1 tablet at 09/08/20 0826 .  pantoprazole (PROTONIX) EC tablet 40 mg, 40 mg, Oral, Daily, Dahal, Binaya, MD, 40 mg at 09/08/20 0818 .  phosphorus (K PHOS NEUTRAL) tablet 500 mg, 500 mg, Oral, BID, Dahal, Binaya, MD, 500 mg at 09/08/20 0817 .  potassium chloride SA (KLOR-CON) CR tablet 40 mEq, 40 mEq, Oral, TID, Dahal, Binaya, MD, 40 mEq at 09/08/20 0817 .  sodium chloride (OCEAN) 0.65 % nasal spray 1 spray, 1 spray, Each Nare, PRN, Dahal, Binaya, MD, 1 spray at 09/02/20 1353 .  thiamine tablet 100 mg, 100 mg, Oral, Daily, Tat, David, MD, 100 mg at 09/08/20 0817 .  vitamin B-12 (CYANOCOBALAMIN) tablet 1,000 mcg, 1,000 mcg, Oral, Daily, Dahal, Binaya, MD, 1,000 mcg at 09/08/20 0817   Objective: Blood pressure 121/78, pulse 89, temperature 98.5 F (36.9 C), resp. rate 16, height 5' 2"  (1.575 m), weight 87.3 kg, SpO2 100 %. Patient is alert and responds appropriate to questions. Abdomen is full.  Bowel sounds are hyperactive.  Percussion note is very sympathetic. On palpation  abdomen is soft.  She has mild midepigastric tenderness. No LE edema noted.  Persistent discoloration and wrinkling to skin indicating resolution of edema.  Labs/studies Results:   CBC Latest Ref Rng & Units 09/07/2020 09/06/2020 09/05/2020  WBC 4.0 - 10.5 K/uL 4.6 4.2 3.6(L)  Hemoglobin 12.0 - 15.0 g/dL 12.1 12.4 10.4(L)  Hematocrit 36 - 46 % 37.2 38.0 31.3(L)  Platelets 150 - 400 K/uL 103(L) 90(L) 71(L)    CMP Latest Ref Rng & Units 09/07/2020 09/06/2020 09/05/2020  Glucose 70 - 99 mg/dL 131(H) 141(H) 124(H)  BUN 8 -  23 mg/dL <5(L) <5(L) 5(L)  Creatinine 0.44 - 1.00 mg/dL 0.68 0.62 0.54  Sodium 135 - 145 mmol/L 134(L) 135 136  Potassium 3.5 - 5.1 mmol/L 4.2 3.8 2.5(LL)  Chloride 98 - 111 mmol/L 105 108 109  CO2 22 - 32 mmol/L 22 21(L) 20(L)  Calcium 8.9 - 10.3 mg/dL 8.4(L) 7.9(L) 6.9(L)  Total Protein 6.5 - 8.1 g/dL - - -  Total Bilirubin 0.3 - 1.2 mg/dL - - -  Alkaline Phos 38 - 126 U/L - - -  AST 15 - 41 U/L - - -  ALT 0 - 44 U/L - - -    Hepatic Function Latest Ref Rng & Units 08/30/2020 08/29/2020 08/28/2020  Total Protein 6.5 - 8.1 g/dL 6.0(L) 6.2(L) -  Albumin 3.5 - 5.0 g/dL 2.9(L) 3.1(L) -  AST 15 - 41 U/L 16 18 -  ALT 0 - 44 U/L 16 17 -  Alk Phosphatase 38 - 126 U/L 69 73 -  Total Bilirubin 0.3 - 1.2 mg/dL 1.3(H) 1.8(H) 2.1(H)  Bilirubin, Direct 0.0 - 0.2 mg/dL - - 0.7(H)    Ileal and colonic biopsies will be out tomorrow.  Hepatitis A total antibody nonreactive Hepatitis B surface antigen negative. Hepatitis BC IgM antibody nonreactive. Hepatitis B surface antibody and B core total antibody reactive. HCV antibody nonreactive.  Serum IgG [phone])  ANA not completed.  Smooth muscle antibody is 9(normal up to 19)  GI pathogen panel is negative.  Assessment:  #1.  Chronic diarrhea.  GI pathogen panel is negative.  She underwent colonoscopy with ileoscopy by Dr. Jenetta Downer.  She had diffuse colonic edema and flattening to mucosa of terminal ileum.  It remains to be seen if biopsy will provide critical information.  I suspect these changes are due to hypoproteinemia and edema.  Need to rescreen for celiac disease. She has significant calcification into the origin of SMA and minimal calcification to the origin of celiac trunk.  If her symptom complex remains unexplained would consider CT abdomen and pelvis.  She has B12 deficiency and folate deficiency which would go along with malabsorptive diarrhea.  She is on oral B12 but I am afraid she may not absorb it.  #2.  Hypokalemia  secondary to GI losses.  #3.  Thrombocytopenia.  She has fatty liver.  She also has small amount of ascites raising possibility of cirrhosis.  Ascites could also be due to malnutrition and hypoalbuminemia. Elastography may help determine noninvasively if she has cirrhosis.  #4.  Mental status changes.  CT did reveal very small subdural hematoma at left frontal convexity measuring only 4 mm in maximal thickness.  Suspect mental status changes due to dehydration.   Plan:  Celiac antibody panel. Fecal elastase. Consider parenteral B12 as she may not absorb oral B12. Change loperamide to 2 mg p.o. 3 times daily. Abdominal ultrasound with elastography.      Subjective:  Patient says she  does not feel well.  She says she ate some of her breakfast and lunch.  According to nursing staff she ate less than 50%.  She already has had 3 loose stools.  Stools are moderate volume and watery.  Patient complains of epigastric pain. Patient says she does not have any close family members.  Both her children died of drug overdose at age 7 and 8.  She says she has been in assisted living  Current Medications:  Current Facility-Administered Medications:  .  0.9 %  sodium chloride infusion, , Intravenous, Continuous, Dahal, Binaya, MD, Last Rate: 75 mL/hr at 09/07/20 2247, New Bag at 09/07/20 2247 .  acetaminophen (TYLENOL) tablet 650 mg, 650 mg, Oral, Q6H PRN, 650 mg at 09/02/20 1814 **OR** acetaminophen (TYLENOL) suppository 650 mg, 650 mg, Rectal, Q6H PRN, Tat, David, MD .  cefTRIAXone (ROCEPHIN) 2 g in sodium chloride 0.9 % 100 mL IVPB, 2 g, Intravenous, Q24H, Dahal, Binaya, MD, Last Rate: 200 mL/hr at 09/08/20 0817, 2 g at 09/08/20 0817 .  Chlorhexidine Gluconate Cloth 2 % PADS 6 each, 6 each, Topical, Daily, Dahal, Marlowe Aschoff, MD, 6 each at 09/08/20 0819 .  colestipol (COLESTID) tablet 1 g, 1 g, Oral, BID, Montez Morita, Daniel, MD, 1 g at 09/08/20 0818 .  [START ON 09/11/2020] cyanocobalamin  ((VITAMIN B-12)) injection 1,000 mcg, 1,000 mcg, Intramuscular, Weekly, Dahal, Binaya, MD .  folic acid (FOLVITE) tablet 1 mg, 1 mg, Oral, Daily, Dahal, Binaya, MD, 1 mg at 09/08/20 0817 .  levothyroxine (SYNTHROID, LEVOTHROID) injection 75 mcg, 75 mcg, Intravenous, Daily, Dahal, Binaya, MD, 75 mcg at 09/08/20 0818 .  liver oil-zinc oxide (DESITIN) 40 % ointment, , Topical, PRN, Terrilee Croak, MD, Given at 09/02/20 1815 .  loperamide (IMODIUM) capsule 2 mg, 2 mg, Oral, TID AC, Ethylene Reznick U, MD, 2 mg at 09/08/20 0818 .  loratadine (CLARITIN) tablet 10 mg, 10 mg, Oral, Daily PRN, Dahal, Binaya, MD, 10 mg at 09/02/20 1814 .  MEDLINE mouth rinse, 15 mL, Mouth Rinse, BID, Tat, David, MD, 15 mL at 09/08/20 0819 .  metroNIDAZOLE (FLAGYL) IVPB 500 mg, 500 mg, Intravenous, Q8H, Dahal, Binaya, MD, Last Rate: 100 mL/hr at 09/08/20 0615, 500 mg at 09/08/20 0615 .  ondansetron (ZOFRAN) tablet 4 mg, 4 mg, Oral, Q6H PRN, 4 mg at 08/30/20 1034 **OR** ondansetron (ZOFRAN) injection 4 mg, 4 mg, Intravenous, Q6H PRN, Tat, David, MD, 4 mg at 08/30/20 1743 .  oxyCODONE (OXYCONTIN) 12 hr tablet 20 mg, 20 mg, Oral, Q12H, Dahal, Binaya, MD, 20 mg at 09/08/20 0032 .  oxyCODONE-acetaminophen (PERCOCET/ROXICET) 5-325 MG per tablet 1 tablet, 1 tablet, Oral, Q8H PRN, Terrilee Croak, MD, 1 tablet at 09/08/20 0826 .  pantoprazole (PROTONIX) EC tablet 40 mg, 40 mg, Oral, Daily, Dahal, Binaya, MD, 40 mg at 09/08/20 0818 .  phosphorus (K PHOS NEUTRAL) tablet 500 mg, 500 mg, Oral, BID, Dahal, Binaya, MD, 500 mg at 09/08/20 0817 .  potassium chloride SA (KLOR-CON) CR tablet 40 mEq, 40 mEq, Oral, TID, Dahal, Binaya, MD, 40 mEq at 09/08/20 0817 .  sodium chloride (OCEAN) 0.65 % nasal spray 1 spray, 1 spray, Each Nare, PRN, Dahal, Binaya, MD, 1 spray at 09/02/20 1353 .  thiamine tablet 100 mg, 100 mg, Oral, Daily, Tat, David, MD, 100 mg at 09/08/20 0817 .  vitamin B-12 (CYANOCOBALAMIN) tablet 1,000 mcg, 1,000 mcg, Oral, Daily, Dahal,  Binaya, MD, 1,000 mcg at 09/08/20 0817   Objective: Blood pressure 121/78, pulse 89, temperature 98.5 F (36.9 C),  resp. rate 16, height 5' 2"  (1.575 m), weight 87.3 kg, SpO2 100 %. Patient is alert and responds appropriately to questions. She appears to be chronically ill. She has small scab over tip of her nose. Abdomen is protuberant.  Bowel sounds are normal.  On palpation abdomen is soft.  She has mild midepigastric tenderness.  No organomegaly or masses. She has changes of stasis dermatitis involving both legs with increased pigmentation and scaly skin but no edema at present.  Labs/studies Results:   CBC Latest Ref Rng & Units 09/07/2020 09/06/2020 09/05/2020  WBC 4.0 - 10.5 K/uL 4.6 4.2 3.6(L)  Hemoglobin 12.0 - 15.0 g/dL 12.1 12.4 10.4(L)  Hematocrit 36 - 46 % 37.2 38.0 31.3(L)  Platelets 150 - 400 K/uL 103(L) 90(L) 71(L)    CMP Latest Ref Rng & Units 09/07/2020 09/06/2020 09/05/2020  Glucose 70 - 99 mg/dL 131(H) 141(H) 124(H)  BUN 8 - 23 mg/dL <5(L) <5(L) 5(L)  Creatinine 0.44 - 1.00 mg/dL 0.68 0.62 0.54  Sodium 135 - 145 mmol/L 134(L) 135 136  Potassium 3.5 - 5.1 mmol/L 4.2 3.8 2.5(LL)  Chloride 98 - 111 mmol/L 105 108 109  CO2 22 - 32 mmol/L 22 21(L) 20(L)  Calcium 8.9 - 10.3 mg/dL 8.4(L) 7.9(L) 6.9(L)  Total Protein 6.5 - 8.1 g/dL - - -  Total Bilirubin 0.3 - 1.2 mg/dL - - -  Alkaline Phos 38 - 126 U/L - - -  AST 15 - 41 U/L - - -  ALT 0 - 44 U/L - - -    Hepatic Function Latest Ref Rng & Units 08/30/2020 08/29/2020 08/28/2020  Total Protein 6.5 - 8.1 g/dL 6.0(L) 6.2(L) -  Albumin 3.5 - 5.0 g/dL 2.9(L) 3.1(L) -  AST 15 - 41 U/L 16 18 -  ALT 0 - 44 U/L 16 17 -  Alk Phosphatase 38 - 126 U/L 69 73 -  Total Bilirubin 0.3 - 1.2 mg/dL 1.3(H) 1.8(H) 2.1(H)  Bilirubin, Direct 0.0 - 0.2 mg/dL - - 0.7(H)    TSH was 70.6843 days ago and yesterday 78.985   Terminal ileal biopsy shows mucosa with hyperemia but no inflammation or granulomas.  Random colonic biopsies show  mucosal hyperemia but no granulomas or other abnormalities.  Anal biopsy shows inflamed squamous mucosa with reactive squamous atypia.  PAS stains pending.  Hepatitis A total antibody nonreactive Hepatitis B surface antigen negative. Hepatitis BC IgM antibody nonreactive. Hepatitis B surface antibody and B core total antibody reactive. HCV antibody nonreactive.  Serum IgG [phone])  ANA, smooth muscle antibody, antimitochondrial antibodies pending  GI pathogen panel is negative.  Qualitative fecal fat pending. Stool electrolytes and osmolality pending. Fecal pancreatic elastase pending. Celiac antibody panel Tissue transglutaminase antibody IgA less than 2 Gliadin IgG 2 Antigliadin antibody IgA 4 Reticulin antibody IgA negative. Serum IgA still pending. ANA and serum immunoglobulins pending.  Assessment:  #1.  Chronic diarrhea.  Patient has undergone extensive work-up and up to this point without specific diagnosis.  Ileal and colonic biopsies revealed hyperemia but no colitis.  GI pathogen panel and C. difficile tests negative.  Celiac disease panel negative ending IgA level.  Given multiple deficiencies it is clear that she has malabsorptive syndrome.  Question remains whether this will turn out to be primary disorder or secondary.  Patient is on IV antibiotics for possible sepsis but antibiotics have not had any impact on her diarrhea. It is incredible that her H&H is normal despite all other abnormalities.  #2.  Fatty liver disease.  Elastography not consistent with cirrhosis.  Viral markers suggest immunity to hepatitis B.  Test for a and C are negative.  #3.  Electrolyte abnormalities have improved with IV replacement.  #4.  Metabolic encephalopathy on admission.  Patient appears to be back at baseline.  #5.  B12 and folate deficiency.  Patient is on oral folate and parenteral as well as oral B12.  #6.  Hypothyroidism.  TSH is still climbing even though patient is  receiving parenteral levothyroxine.  Further dose adjustment per Dr. Benny Lennert.  #7.  Thrombocytopenia.  Thrombocytopenia has improved which may indicate marrow dysfunction due to B12 and folate deficiency.   Plan:  Wait for the results of pending stool studies. Change loperamide to 2 mg p.o. 4 times daily. Check vitamin a and D levels.

## 2020-09-09 DIAGNOSIS — K909 Intestinal malabsorption, unspecified: Secondary | ICD-10-CM | POA: Diagnosis not present

## 2020-09-09 DIAGNOSIS — L89322 Pressure ulcer of left buttock, stage 2: Secondary | ICD-10-CM | POA: Diagnosis not present

## 2020-09-09 DIAGNOSIS — R197 Diarrhea, unspecified: Secondary | ICD-10-CM | POA: Diagnosis not present

## 2020-09-09 DIAGNOSIS — I69351 Hemiplegia and hemiparesis following cerebral infarction affecting right dominant side: Secondary | ICD-10-CM | POA: Diagnosis not present

## 2020-09-09 DIAGNOSIS — K529 Noninfective gastroenteritis and colitis, unspecified: Secondary | ICD-10-CM

## 2020-09-09 DIAGNOSIS — G9341 Metabolic encephalopathy: Secondary | ICD-10-CM | POA: Diagnosis not present

## 2020-09-09 DIAGNOSIS — R7303 Prediabetes: Secondary | ICD-10-CM | POA: Diagnosis not present

## 2020-09-09 DIAGNOSIS — F419 Anxiety disorder, unspecified: Secondary | ICD-10-CM | POA: Diagnosis not present

## 2020-09-09 DIAGNOSIS — K7460 Unspecified cirrhosis of liver: Secondary | ICD-10-CM | POA: Diagnosis not present

## 2020-09-09 DIAGNOSIS — D696 Thrombocytopenia, unspecified: Secondary | ICD-10-CM | POA: Diagnosis not present

## 2020-09-09 DIAGNOSIS — E034 Atrophy of thyroid (acquired): Secondary | ICD-10-CM | POA: Diagnosis not present

## 2020-09-09 DIAGNOSIS — R739 Hyperglycemia, unspecified: Secondary | ICD-10-CM | POA: Diagnosis not present

## 2020-09-09 DIAGNOSIS — K6289 Other specified diseases of anus and rectum: Secondary | ICD-10-CM | POA: Diagnosis not present

## 2020-09-09 DIAGNOSIS — S065X9A Traumatic subdural hemorrhage with loss of consciousness of unspecified duration, initial encounter: Secondary | ICD-10-CM | POA: Diagnosis not present

## 2020-09-09 DIAGNOSIS — I10 Essential (primary) hypertension: Secondary | ICD-10-CM | POA: Diagnosis not present

## 2020-09-09 DIAGNOSIS — R52 Pain, unspecified: Secondary | ICD-10-CM | POA: Diagnosis not present

## 2020-09-09 DIAGNOSIS — Z79899 Other long term (current) drug therapy: Secondary | ICD-10-CM | POA: Diagnosis not present

## 2020-09-09 DIAGNOSIS — R4182 Altered mental status, unspecified: Secondary | ICD-10-CM | POA: Diagnosis not present

## 2020-09-09 DIAGNOSIS — R531 Weakness: Secondary | ICD-10-CM | POA: Diagnosis not present

## 2020-09-09 DIAGNOSIS — Z7401 Bed confinement status: Secondary | ICD-10-CM | POA: Diagnosis not present

## 2020-09-09 DIAGNOSIS — A419 Sepsis, unspecified organism: Secondary | ICD-10-CM | POA: Diagnosis not present

## 2020-09-09 DIAGNOSIS — E538 Deficiency of other specified B group vitamins: Secondary | ICD-10-CM | POA: Diagnosis not present

## 2020-09-09 DIAGNOSIS — E559 Vitamin D deficiency, unspecified: Secondary | ICD-10-CM | POA: Diagnosis not present

## 2020-09-09 DIAGNOSIS — E876 Hypokalemia: Secondary | ICD-10-CM | POA: Diagnosis not present

## 2020-09-09 DIAGNOSIS — E039 Hypothyroidism, unspecified: Secondary | ICD-10-CM | POA: Diagnosis not present

## 2020-09-09 DIAGNOSIS — E785 Hyperlipidemia, unspecified: Secondary | ICD-10-CM | POA: Diagnosis not present

## 2020-09-09 DIAGNOSIS — R188 Other ascites: Secondary | ICD-10-CM | POA: Diagnosis not present

## 2020-09-09 DIAGNOSIS — A09 Infectious gastroenteritis and colitis, unspecified: Secondary | ICD-10-CM | POA: Diagnosis not present

## 2020-09-09 LAB — MAGNESIUM: Magnesium: 2.5 mg/dL — ABNORMAL HIGH (ref 1.7–2.4)

## 2020-09-09 LAB — SARS CORONAVIRUS 2 BY RT PCR (HOSPITAL ORDER, PERFORMED IN ~~LOC~~ HOSPITAL LAB): SARS Coronavirus 2: NEGATIVE

## 2020-09-09 LAB — VITAMIN D 25 HYDROXY (VIT D DEFICIENCY, FRACTURES): Vit D, 25-Hydroxy: 17.39 ng/mL — ABNORMAL LOW (ref 30–100)

## 2020-09-09 NOTE — Progress Notes (Signed)
Attempted to call [place], no response. Will try again.

## 2020-09-09 NOTE — Progress Notes (Signed)
PT assisted patient OOB to chair. Patient began yelling "help me, someone help me." Nursing staff went in to check on patient and she told staff "my back hurts, I want to get back in the bed." Nursing staff and Dr. Benny Lennert explained benefits of sitting up in chair and getting OOB. Patient refused to allow staff to attempt to make her comfortable in the chair. When staff asked patient to slide back in the chair to attempt to make her comfortable, patient replies "I said no. Help me back in the chair." Patient stood unassisted and put herself back into the bed with nursing staff in the room. Purewick replaced, bed alarm on. Will continue to monitor.

## 2020-09-09 NOTE — Progress Notes (Signed)
Deborah Russo, M.D. Gastroenterology & Hepatology   Interval History:  No acute events overnight. Patient states that she ate small amount of food yesterday but does not have too much appetite.  She had 1 bowel movement today in the morning, without any blood or melena.  Denies having any abdominal pain different from her usual discomfort.  No fever, chills, nausea or vomiting. Biopsy staining came back negative for amyloidosis or viral staining.  Inpatient Medications:  Current Facility-Administered Medications:  .  0.9 %  sodium chloride infusion, , Intravenous, Continuous, Dahal, Binaya, MD, Last Rate: 75 mL/hr at 09/09/20 0454, New Bag at 09/09/20 0454 .  acetaminophen (TYLENOL) tablet 650 mg, 650 mg, Oral, Q6H PRN, 650 mg at 09/02/20 1814 **OR** acetaminophen (TYLENOL) suppository 650 mg, 650 mg, Rectal, Q6H PRN, Tat, David, MD .  Chlorhexidine Gluconate Cloth 2 % PADS 6 each, 6 each, Topical, Daily, Dahal, Marlowe Aschoff, MD, 6 each at 09/09/20 0903 .  colestipol (COLESTID) tablet 1 g, 1 g, Oral, BID, Montez Morita, Kerron Sedano, MD, 1 g at 09/09/20 0855 .  [START ON 09/11/2020] cyanocobalamin ((VITAMIN B-12)) injection 1,000 mcg, 1,000 mcg, Intramuscular, Weekly, Dahal, Binaya, MD .  folic acid (FOLVITE) tablet 1 mg, 1 mg, Oral, Daily, Dahal, Binaya, MD, 1 mg at 09/09/20 0854 .  levothyroxine (SYNTHROID, LEVOTHROID) injection 75 mcg, 75 mcg, Intravenous, Daily, Dahal, Binaya, MD, 75 mcg at 09/09/20 0857 .  liver oil-zinc oxide (DESITIN) 40 % ointment, , Topical, PRN, Terrilee Croak, MD, 1 application at 53/29/92 0407 .  loperamide (IMODIUM) capsule 2 mg, 2 mg, Oral, QID, Rehman, Najeeb U, MD, 2 mg at 09/09/20 0854 .  loratadine (CLARITIN) tablet 10 mg, 10 mg, Oral, Daily PRN, Dahal, Binaya, MD, 10 mg at 09/02/20 1814 .  MEDLINE mouth rinse, 15 mL, Mouth Rinse, BID, Tat, David, MD, 15 mL at 09/08/20 0819 .  ondansetron (ZOFRAN) tablet 4 mg, 4 mg, Oral, Q6H PRN, 4 mg at 09/08/20 2146 **OR**  ondansetron (ZOFRAN) injection 4 mg, 4 mg, Intravenous, Q6H PRN, Tat, David, MD, 4 mg at 08/30/20 1743 .  oxyCODONE (OXYCONTIN) 12 hr tablet 20 mg, 20 mg, Oral, Q12H, Dahal, Binaya, MD, 20 mg at 09/09/20 0347 .  oxyCODONE-acetaminophen (PERCOCET/ROXICET) 5-325 MG per tablet 1 tablet, 1 tablet, Oral, Q8H PRN, Terrilee Croak, MD, 1 tablet at 09/09/20 0905 .  pantoprazole (PROTONIX) EC tablet 40 mg, 40 mg, Oral, Daily, Dahal, Binaya, MD, 40 mg at 09/09/20 0855 .  phosphorus (K PHOS NEUTRAL) tablet 500 mg, 500 mg, Oral, BID, Dahal, Binaya, MD, 500 mg at 09/09/20 0855 .  potassium chloride SA (KLOR-CON) CR tablet 40 mEq, 40 mEq, Oral, TID, Dahal, Binaya, MD, 40 mEq at 09/09/20 0854 .  sodium chloride (OCEAN) 0.65 % nasal spray 1 spray, 1 spray, Each Nare, PRN, Dahal, Binaya, MD, 1 spray at 09/02/20 1353 .  thiamine tablet 100 mg, 100 mg, Oral, Daily, Tat, David, MD, 100 mg at 09/09/20 0855 .  vitamin B-12 (CYANOCOBALAMIN) tablet 1,000 mcg, 1,000 mcg, Oral, Daily, Dahal, Binaya, MD, 1,000 mcg at 09/09/20 0854   I/O    Intake/Output Summary (Last 24 hours) at 09/09/2020 1106 Last data filed at 09/08/2020 2100 Gross per 24 hour  Intake 5623.48 ml  Output 1050 ml  Net 4573.48 ml     Physical Exam: Temp:  [97.7 F (36.5 C)-97.9 F (36.6 C)] 97.9 F (36.6 C) (09/21 0539) Pulse Rate:  [81-82] 81 (09/21 0539) Resp:  [18] 18 (09/21 0539) BP: (126-154)/(82-98) 126/82 (09/21 0539) SpO2:  [  100 %] 100 % (09/21 0539)  Temp (24hrs), Avg:97.8 F (36.6 C), Min:97.7 F (36.5 C), Max:97.9 F (36.6 C) GENERAL: The patient is AO x3t, in no acute distress. HEENT: Head is normocephalic and atraumatic. EOMI are intact. Mouth is well hydrated and without lesions. NECK: Supple. No masses LUNGS: Clear to auscultation. No presence of rhonchi/wheezing/rales. Adequate chest expansion HEART: RRR, normal s1 and s2. ABDOMEN: Mildly tender to palpation in the epigastric area but no guarding, no peritoneal signs, and  nondistended. BS +. No masses. EXTREMITIES: Without any cyanosis, clubbing, rash, lesions or edema. NEUROLOGIC: AOx3, no focal motor deficit.  No asterixis. SKIN: no jaundice, no rashes  Laboratory Data: CBC:     Component Value Date/Time   WBC 4.0 09/08/2020 0658   RBC 3.04 (L) 09/08/2020 0658   HGB 10.8 (L) 09/08/2020 0658   HGB 15.3 02/06/2019 1632   HCT 33.1 (L) 09/08/2020 0658   HCT 45.0 02/06/2019 1632   PLT 100 (L) 09/08/2020 0658   PLT 256 02/06/2019 1632   MCV 108.9 (H) 09/08/2020 0658   MCV 88 02/06/2019 1632   MCH 35.5 (H) 09/08/2020 0658   MCHC 32.6 09/08/2020 0658   RDW 16.0 (H) 09/08/2020 0658   RDW 14.9 02/06/2019 1632   LYMPHSABS 1.6 09/08/2020 0658   LYMPHSABS 3.0 02/06/2019 1632   MONOABS 0.2 09/08/2020 0658   EOSABS 0.1 09/08/2020 0658   EOSABS 0.1 02/06/2019 1632   BASOSABS 0.0 09/08/2020 0658   BASOSABS 0.1 02/06/2019 1632   COAG:  Lab Results  Component Value Date   INR 1.1 09/03/2020   INR 1.22 06/11/2018   INR 0.95 06/16/2017    BMP:  BMP Latest Ref Rng & Units 09/08/2020 09/07/2020 09/06/2020  Glucose 70 - 99 mg/dL 101(H) 131(H) 141(H)  BUN 8 - 23 mg/dL <5(L) <5(L) <5(L)  Creatinine 0.44 - 1.00 mg/dL 0.64 0.68 0.62  BUN/Creat Ratio 9 - 23 - - -  Sodium 135 - 145 mmol/L 136 134(L) 135  Potassium 3.5 - 5.1 mmol/L 3.6 4.2 3.8  Chloride 98 - 111 mmol/L 110 105 108  CO2 22 - 32 mmol/L 19(L) 22 21(L)  Calcium 8.9 - 10.3 mg/dL 7.2(L) 8.4(L) 7.9(L)    HEPATIC:  Hepatic Function Latest Ref Rng & Units 08/30/2020 08/29/2020 08/28/2020  Total Protein 6.5 - 8.1 g/dL 6.0(L) 6.2(L) -  Albumin 3.5 - 5.0 g/dL 2.9(L) 3.1(L) -  AST 15 - 41 U/L 16 18 -  ALT 0 - 44 U/L 16 17 -  Alk Phosphatase 38 - 126 U/L 69 73 -  Total Bilirubin 0.3 - 1.2 mg/dL 1.3(H) 1.8(H) 2.1(H)  Bilirubin, Direct 0.0 - 0.2 mg/dL - - 0.7(H)    CARDIAC:  Lab Results  Component Value Date   CKTOTAL 177 08/28/2020   TROPONINI 1.20 ([place]) 06/12/2018      Imaging: I personally reviewed  and interpreted the available labs, imaging and endoscopic files.   Assessment/Plan: Deborah Russo is a  59 y.o. female with history of chronic abdominal pain, hypothyroidism, hypertension, GERD, prediabetes, OSA, stroke, substance abuse, history of C. difficile colitis, chronic abdominal pain, who was admitted to the hospital after presenting altered mental status and persistent diarrhea.  The patient has presented major electrolyte abnormalities with presence of significant diarrhea throughout her hospital stay.  She had negative testing for infectious causes of diarrhea.  She underwent previously extensive investigation rule out carcinoid syndrome, gastrinoma and celiac disease.  Patient presented with pancolitis on imaging, colonoscopy was performed which  showed presence of flattening of her villi in the terminal ileum and lack of haustrations in the colon, with biopsies only showing diffuse edema but no other alterations, negative staining for amyloidosis or viruses.  The patient has had a thorough evaluation during this hospitalization it which includes negative TTG IgA.  Was found to have elevated TSH 78.  At this point, the etiology of her malabsorption is unclear but her diarrhea has improved with the use of standing loperamide and Colestid, which she should continue.  We will follow result of pancreatic elastase and fecal fat.  Patient had liver elastography which was negative for cirrhosis.  Was found to have ascites on imaging with unclear etiology.  If this were to worsen she will need to have a paracentesis in the future to characterize the fluid.  Patient will need to follow in the GI clinic for her current conditions.  Diet can be advanced as tolerated.  # Chronic diarrhea # Malabsorption # Ascites # Fatty liver disease - Follow up pancreatic elastase and repeat stool gap - Continue with loperamide TID and Colestid 1 g BID - Continue oral folate and parenteral/oral Vitamin B12 -  Follow up in GI clinic in 1-2 weeks  Deborah Peppers, MD Gastroenterology and Hepatology [facility]  Note: Occasional unusual wording and randomly placed punctuation marks may result from the use of speech recognition technology to transcribe this document

## 2020-09-09 NOTE — Care Management Important Message (Signed)
Important Message  Patient Details  Name: Deborah Russo MRN: [id number] Date of Birth: 30-Dec-1960   Medicare Important Message Given:  Yes     Tommy Medal 09/09/2020, 3:23 PM

## 2020-09-09 NOTE — TOC Transition Note (Addendum)
Transition of Care [facility]) - CM/SW Discharge Note   Patient Details  Name: Deborah Russo MRN: [id number] Date of Birth: 17-Jan-1961  Transition of Care [facility] CM/SW Contact:  Shade Flood, LCSW Phone Number: 09/09/2020, 2:20 PM   Clinical Narrative:     Pt stable for dc today per MD. Plan remains for transfer to [facility] for SNF rehab. Updated Melissa at [facility] and they are prepared to take pt today. DC clinical will be sent electronically. RN to call report. TOC arranged EMS.  There are no other TOC needs for dc.  1500: Notified Melissa at [facility] that MD added to her the dc summary. Melissa stated she would pull it from Epic and review the changes.  Final next level of care: Skilled Nursing Facility Barriers to Discharge: Barriers Resolved   Patient Goals and CMS Choice Patient states their goals for this hospitalization and ongoing recovery are:: Rehab with SNF   Choice offered to / list presented to : Patient, Adult Children  Discharge Placement   Existing PASRR number confirmed : 08/31/20          Patient chooses bed at: [facility] Patient to be transferred to facility by: EMS Name of family member notified: Darnell Level (son) via HIPPA compliant Voicemail message Patient and family notified of of transfer: 09/09/20  Discharge Plan and Services                                     Social Determinants of Health (SDOH) Interventions     Readmission Risk Interventions Readmission Risk Prevention Plan 08/31/2020  Transportation Screening Complete  PCP or Specialist Appt within 5-7 Days Complete  Home Care Screening Complete  Medication Review (RN CM) Complete  Some recent data might be hidden

## 2020-09-09 NOTE — Discharge Summary (Addendum)
Physician Discharge Summary  PERRI ARAGONES EXB:[id number] DOB: 1961-01-17 DOA: 08/28/2020  PCP: Baruch Gouty, FNP (Inactive)  Admit date: 08/28/2020 Discharge date: 09/09/2020  Recommendations for Outpatient Follow-up:  1. Discharge to SNF for rehab. 2. She will need to have a chemistry with magnesium and phosphorus drawn in one week. Report results to facility physician. 3. Follow up with PCP in 7-10 days after discharge from facility. 4. Follow up with GI in clinic in 1-2 weeks. 5. Needs follow up with endocrinology due to elevated TSH despite treatment.   Contact information for after-discharge care    Destination    [facility] .   Service: Skilled Nursing Contact information: [place] [phone]                   Discharge Diagnoses: Principal diagnosis is #1 1. Chronic diarrhea 2. Hypokalemia/hypomagnesemia/hypophosphatemia 3. Debility 4. Thrombocytopenia 5. Severe sepsis: Resolved 6. Chronic abdominal pain 7. Acute metabolic encephalopathy 8. Subdural hematoma 9. Hypothyroidism 10. Impaired mobility 11. Macrocytosis due to Vitamin B12 deficiency 12. Hepatic cirrhosis, NASH with thrombocytopenia and hypoalbuminemia 13. Hyperlipidemia 14. Essential Hypertension 15. Elevated troponin  Discharge Condition: Fair  Disposition: SNF  Diet recommendation: Heart healthy  Filed Weights   08/28/20 1649 09/02/20 0458 09/04/20 0637  Weight: 80.3 kg 86.6 kg 87.3 kg    History of present illness: SANTITA HUNSBERGER is a 59 y.o. female with medical history of 59 year old female with a history of stroke, chronic nausea and vomiting and abdominal pain, hypothyroidism, low B12, thrombocytopenia, hyperlipidemia, hypertension, GERD, prediabetes presenting with altered mental status.  Unfortunately, the patient is unable to provide any significant history secondary to her altered mental status.  Attempts were made to  contact the patient's family without any answer.  Apparently, the patient lives with caregivers that assist with her activities of daily living.  Apparently, one of the caregivers was gone for the weekend, and the other caregiver came by to check up on the patient and found the patient lying in her urine and feces.  She was confused.  EMS was activated.  The patient was awake and alert, but slow to respond and confused during my examination.  Review of systems was extremely limited.  She did deny any headache, visual disturbance, chest pain, shortness breath, abdominal pain. In the emergency department, the patient had low-grade temperature 9 9.4 F.  She was initially hypotensive with systolic blood pressure in the 70s.  She was fluid resuscitated with improvement of her blood pressure into the low 90s.  BMP showed a potassium less than 2.  Serum creatinine was 1.08.  LFTs revealed AST 32, ALT 23, alk phosphatase 107, total bilirubin 2.3, WBC 8.4, globin 17.6, platelets 157,000.  The patient was given lactated Ringer's x1 L.  CT of the brain showed a very small subdural hematoma on the left lateral convexity.  Chest x-ray showed mild increased interstitial markings without consolidation.  Urinalysis was negative for pyuria.  Lactic acid 2.3, CK 177, troponin 34.  EKG shows sinus rhythm with nonspecific ST-T wave changes.  Hospital Course: Tunisha Ruland Conawayis a 59 y.o.femalewith PMH of HTN, HLD, prediabetes, stroke, hypothyroidism, vitamin B12 deficiency, thrombocytopenia, GERD, chronic migraine, chronic pain, chronic nausea, vomiting, abdominal pain. Patient presented to the ED on9/9/2021with altered mental status. Caregiver found her confused, lying in her urine and feces. EMS brought her to ED.   In the ED, patient had a low-grade temperature of 99.4, initially hypotensive  with systolic blood pressure in 70s, blood pressure improved with IV fluids to 90s. Labs showed potassium level less than 2,  WBC normal. Lactic acid level is elevated to 2.3.  CT of the brain showed a very small subdural hematoma on the left lateral convexity. Chest x-ray showed mild increased interstitial markings without consolidation. Urinalysis was negative for pyuria.   Patient was admitted to hospitalist service for further evaluation management.  CT abdomen and pelvis obtained subsequently showed an extensive colonic wall thickening throughout, most conspicuous in the transverse colon, and there is fluid in the distal colon and rectum. The terminal ileum may also be involved. Findings are consistent with nonspecific infectious, inflammatory, or ischemic colitis, differential considerations generally including inflammatory bowel disease given distribution and recurrent nature.  Her hospitalization is prolonged because of persistent diarrhea. GI is involved and has ordered fecal elastase, celiac antibody, and abdominal ultrasound with elastography. Biopsies remain pending.  She has been cleared for discharge to SNF today by GI. They will follow up with her as outpatient.  Today's assessment: S: The patient is sitting up on the edge of the recliner at bedside. She is crying because she has been out of bed for 10 minutes and is demanding to get back in bed.  O: Vitals:  Vitals:   09/08/20 2129 09/09/20 0539  BP: (!) 154/98 126/82  Pulse: 82 81  Resp: 18 18  Temp: 97.7 F (36.5 C) 97.9 F (36.6 C)  SpO2: 100% 100%   Exam:  Constitutional:  The patient is awake, alert, and oriented x 3. She is distressed, because she is not in bed.  Respiratory:   No increased work of breathing.  No wheezes, rales, or rhonchi  No tactile fremitus Cardiovascular:   Regular rate and rhythm  No murmurs, ectopy, or gallups.  No lateral PMI. No thrills. Abdomen:   Abdomen is soft, non-distended  Non-tender  No hernias, masses, or organomegaly  Normoactive bowel sounds.  Musculoskeletal:   No  cyanosis, clubbing, or edema Skin:   No rashes, lesions, ulcers  palpation of skin: no induration or nodules Neurologic:   CN 2-12 intact  Sensation all 4 extremities intact Psychiatric:   Mental status: Distressed, crying. Unhappy with sitting up. ? Orientation to person, place, time   judgment and insight do not appear intact    Discharge Instructions  Discharge Instructions    Activity as tolerated - No restrictions   Complete by: As directed    Call MD for:  difficulty breathing, headache or visual disturbances   Complete by: As directed    Call MD for:  temperature >100.4   Complete by: As directed    Diet - low sodium heart healthy   Complete by: As directed    Discharge instructions   Complete by: As directed    Discharge to SNF for rehab. She will need to have a chemistry with magnesium and phosphorus drawn in one week. Report results to facility physician. Follow up with PCP in 7-10 days after discharge from facility.   Discharge wound care:   Complete by: As directed    Apply Desitin to sacrum as needed.   Increase activity slowly   Complete by: As directed      Allergies as of 09/09/2020      Reactions   Penicillins Hives   *Tolerates zosyn Has patient had a PCN reaction causing immediate rash, facial/tongue/throat swelling, SOB or lightheadedness with hypotension: No Has patient had a PCN reaction causing severe  rash involving mucus membranes or skin necrosis: No Has patient had a PCN reaction that required hospitalization No Has patient had a PCN reaction occurring within the last 10 years: No If all of the above answers are "NO", then may proceed with Cephalosporin use.   Sulfa Antibiotics Hives      Medication List    STOP taking these medications   cloNIDine 0.2 MG tablet Commonly known as: CATAPRES   pravastatin 20 MG tablet Commonly known as: PRAVACHOL   SUMAtriptan 50 MG tablet Commonly known as: IMITREX     TAKE these medications    aspirin 325 MG tablet Take 1 tablet (325 mg total) by mouth daily.   colestipol 1 g tablet Commonly known as: COLESTID Take 1 tablet (1 g total) by mouth 2 (two) times daily.   cyanocobalamin 1000 MCG tablet Take 1 tablet (1,000 mcg total) by mouth daily.   cyanocobalamin 1000 MCG/ML injection Commonly known as: (VITAMIN B-12) Inject 1 mL (1,000 mcg total) into the muscle once a week. Start taking on: September 11, 2020   diazepam 5 MG tablet Commonly known as: VALIUM Take 1 tablet (5 mg total) by mouth every 8 (eight) hours as needed for anxiety.   folic acid 1 MG tablet Commonly known as: FOLVITE Take 1 tablet (1 mg total) by mouth daily.   levothyroxine 150 MCG tablet Commonly known as: SYNTHROID Take 1 tablet (150 mcg total) by mouth daily. (Needs to be seen before next refill)   liver oil-zinc oxide 40 % ointment Commonly known as: DESITIN Apply topically as needed for irritation.   loperamide 2 MG capsule Commonly known as: IMODIUM Take 1 capsule (2 mg total) by mouth 4 (four) times daily.   oxyCODONE 20 mg 12 hr tablet Commonly known as: OXYCONTIN Take 1 tablet (20 mg total) by mouth every 12 (twelve) hours. What changed:   medication strength  how much to take   oxyCODONE-acetaminophen 5-325 MG tablet Commonly known as: PERCOCET/ROXICET Take 1 tablet by mouth every 8 (eight) hours as needed for severe pain.   pantoprazole 40 MG tablet Commonly known as: PROTONIX Take 1 tablet (40 mg total) by mouth daily.   phosphorus 155-852-130 MG tablet Commonly known as: K PHOS NEUTRAL Take 2 tablets (500 mg total) by mouth 2 (two) times daily.   potassium chloride SA 20 MEQ tablet Commonly known as: Klor-Con M20 Take 2 tablets (40 mEq total) by mouth 2 (two) times daily. What changed:   how much to take  when to take this   sodium chloride 0.65 % Soln nasal spray Commonly known as: OCEAN Place 1 spray into both nostrils as needed for congestion.     thiamine 100 MG tablet Take 1 tablet (100 mg total) by mouth daily.            Discharge Care Instructions  (From admission, onward)         Start     Ordered   09/08/20 0000  Discharge wound care:       Comments: Apply Desitin to sacrum as needed.   09/08/20 1350         Allergies  Allergen Reactions  . Penicillins Hives    *Tolerates zosyn Has patient had a PCN reaction causing immediate rash, facial/tongue/throat swelling, SOB or lightheadedness with hypotension: No Has patient had a PCN reaction causing severe rash involving mucus membranes or skin necrosis: No Has patient had a PCN reaction that required hospitalization No Has patient had a  PCN reaction occurring within the last 10 years: No If all of the above answers are "NO", then may proceed with Cephalosporin use.   . Sulfa Antibiotics Hives    The results of significant diagnostics from this hospitalization (including imaging, microbiology, ancillary and laboratory) are listed below for reference.    Significant Diagnostic Studies: CT HEAD WO CONTRAST  Result Date: 08/28/2020 CLINICAL DATA:  Mental status change.  Not performing ADLs EXAM: CT HEAD WITHOUT CONTRAST TECHNIQUE: Contiguous axial images were obtained from the base of the skull through the vertex without intravenous contrast. COMPARISON:  06/11/2018 FINDINGS: Brain: Remote left parietal infarction centered at the cortex, moderate size. Dilated perivascular space below the left putamen. Chronic lacune at the right caudate head. Best seen on coronal reformats is a thin hyper to isodense collection along the left cerebral convexity, frontotemporal. No hydrocephalus, acute infarct, or masslike finding. Vascular: No hyperdense vessel or unexpected calcification. Skull: Normal. Negative for fracture or focal lesion. Sinuses/Orbits: No acute finding. Critical Value/emergent results were called by telephone at the time of interpretation on 08/28/2020 at 7:09 am to  provider [facility] , who verbally acknowledged these results. IMPRESSION: 1. Thin subdural hematoma along the lateral left frontal convexity, only 4 mm in maximal thickness. 2. Remote infarcts at the left parietal cortex and right caudate head. Electronically Signed   By: Monte Fantasia M.D.   On: 08/28/2020 07:09   CT ABDOMEN PELVIS W CONTRAST  Result Date: 08/29/2020 CLINICAL DATA:  Chronic nausea, vomiting, abdominal pain, fever EXAM: CT ABDOMEN AND PELVIS WITH CONTRAST TECHNIQUE: Multidetector CT imaging of the abdomen and pelvis was performed using the standard protocol following bolus administration of intravenous contrast. CONTRAST:  179m OMNIPAQUE IOHEXOL 300 MG/ML  SOLN COMPARISON:  06/11/2018 FINDINGS: Lower chest: No acute abnormality. Hepatobiliary: No solid liver abnormality is seen. Hepatic steatosis. Status post cholecystectomy. Postoperative biliary ductal dilatation. Pancreas: Unremarkable. No pancreatic ductal dilatation or surrounding inflammatory changes. Spleen: Normal in size without significant abnormality. Adrenals/Urinary Tract: Adrenal glands are unremarkable. Tiny nonobstructive calculus of the inferior pole of the left kidney (series 4, image 70). No hydronephrosis. Bladder is unremarkable. Stomach/Bowel: Stomach is within normal limits. There is extensive colonic wall thickening throughout, most conspicuous in the transverse colon, and there is fluid in the distal colon and rectum. (Series 2, image 39). The terminal ileum may also be involved (series 2, image 49). Vascular/Lymphatic: Severe mixed aortic atherosclerosis. No enlarged abdominal or pelvic lymph nodes. Reproductive: Status post hysterectomy. Other: No abdominal wall hernia or abnormality. Small volume ascites throughout the abdomen and pelvis. Musculoskeletal: No acute or significant osseous findings. IMPRESSION: 1. There is extensive colonic wall thickening throughout, most conspicuous in the transverse  colon, and there is fluid in the distal colon and rectum. The terminal ileum may also be involved. Findings are consistent with nonspecific infectious, inflammatory, or ischemic colitis, differential considerations generally including inflammatory bowel disease given distribution and recurrent nature. 2. Small volume ascites throughout the abdomen and pelvis, likely reactive. 3. Hepatic steatosis. 4. Tiny nonobstructive calculus of the inferior pole of the left kidney. 5. Severe mixed aortic atherosclerosis. Aortic Atherosclerosis (ICD10-I70.0). Electronically Signed   By: AEddie CandleM.D.   On: 08/29/2020 16:42   DG Chest Port 1 View  Result Date: 08/28/2020 CLINICAL DATA:  Weakness.  Smoker. EXAM: PORTABLE CHEST 1 VIEW COMPARISON:  06/11/2018 FINDINGS: Cardiac enlargement. Central peribronchial thickening with diffuse interstitial pattern to the lungs. This could represent edema, pneumonia, or chronic fibrosis. No consolidation.  No pleural effusions. No pneumothorax. Mediastinal contours appear intact. Calcification of the aorta. IMPRESSION: Cardiac enlargement. Central peribronchial thickening with diffuse interstitial pattern to the lungs. Electronically Signed   By: Lucienne Capers M.D.   On: 08/28/2020 06:27   US ABDOMEN COMPLETE W/ELASTOGRAPHY  Result Date: 09/05/2020 CLINICAL DATA:  Fatty liver, ascites, thrombocytopenia EXAM: ULTRASOUND ABDOMEN ULTRASOUND HEPATIC ELASTOGRAPHY TECHNIQUE: Sonography of the upper abdomen was performed. In addition, ultrasound elastography evaluation of the liver was performed. A region of interest was placed within the right lobe of the liver. Following application of a compressive sonographic pulse, tissue compressibility was assessed. Multiple assessments were performed at the selected site. Median tissue compressibility was determined. Previously, hepatic stiffness was assessed by shear wave velocity. Based on recently published Society of Radiologists in  Ultrasound consensus article, reporting is now recommended to be performed in the SI units of pressure (kiloPascals) representing hepatic stiffness/elasticity. The obtained result is compared to the published reference standards. (cACLD = compensated Advanced Chronic Liver Disease) COMPARISON:  CT abdomen and pelvis 08/29/2020 FINDINGS: ULTRASOUND ABDOMEN Gallbladder: Surgically absent Common bile duct: Diameter: 11 mm Liver: Echogenic parenchyma, likely fatty infiltration though this can be seen with cirrhosis and certain infiltrative disorders. No definite focal mass or nodularity. Portal vein is patent on color Doppler imaging with normal direction of blood flow towards the liver. IVC: Limited visualization due to sound attenuation from the liver. Pancreas: Normal appearance Spleen: 12.3 cm length, calculated volume 634 mL Right Kidney: Length: 12.4 cm. Normal morphology without mass or hydronephrosis. Left Kidney: Length: 10.0 cm. Cortical thinning. Lower pole predominantly obscured by bowel gas. No gross mass or hydronephrosis. Abdominal aorta: Distal aorta and bifurcation obscured by bowel gas. Visualized portion normal appearance. Other findings: Trace ascites ULTRASOUND HEPATIC ELASTOGRAPHY Device: Siemens Helix VTQ Patient position: Supine Transducer 4V1 Number of measurements: 10 Hepatic segment:  8 Median kPa: 3.3 IQR: 0.8 IQR/Median kPa ratio: 0,2 Data quality:  Good Diagnostic category: < or = 5 kPa: high probability of being normal; please see notes in impression The use of hepatic elastography is applicable to patients with viral hepatitis and non-alcoholic fatty liver disease. At this time, there is insufficient data for the referenced cut-off values and use in other causes of liver disease, including alcoholic liver disease. Patients, however, may be assessed by elastography and serve as their own reference standard/baseline. In patients with non-alcoholic liver disease, the values suggesting  compensated advanced chronic liver disease (cACLD) may be lower, and patients may need additional testing with elasticity results of 7-9 kPa. Please note that abnormal hepatic elasticity and shear wave velocities may also be identified in clinical settings other than with hepatic fibrosis, such as: acute hepatitis, elevated right heart and central venous pressures including use of beta blockers, veno-occlusive disease (Budd-Chiari), infiltrative processes such as mastocytosis/amyloidosis/infiltrative tumor/lymphoma, extrahepatic cholestasis, with hyperemia in the post-prandial state, and with liver transplantation. Correlation with patient history, laboratory data, and clinical condition recommended. Diagnostic Categories: < or =5 kPa: high probability of being normal < or =9 kPa: in the absence of other known clinical signs, rules out cACLD >9 kPa and ?13 kPa: suggestive of cACLD, but needs further testing >13 kPa: highly suggestive of cACLD > or =17 kPa: highly suggestive of cACLD with an increased probability of clinically significant portal hypertension IMPRESSION: ULTRASOUND ABDOMEN: Probable fatty infiltration of liver as above. Post cholecystectomy. Borderline splenomegaly. ULTRASOUND HEPATIC ELASTOGRAPHY: Median kPa: 3.3 Diagnostic category: < or = 5 kPa: high probability of being normal. Nodes:  Liver is steatotic and demonstrates an nodular margin on the recent CT, suggesting cirrhosis. The current elastography results however do not support a diagnosis of cirrhosis. May consider follow-up/repeat imaging following discharge with patient at baseline state to reassess, if clinically indicated. Electronically Signed   By: Lavonia Dana M.D.   On: 09/05/2020 12:24   ECHOCARDIOGRAM COMPLETE  Result Date: 08/29/2020    ECHOCARDIOGRAM REPORT   Patient Name:   UNA YEOMANS Date of Exam: 08/29/2020 Medical Rec #:  [id number]           Height:       65.0 in Accession #:    [id number]          Weight:        177.0 lb Date of Birth:  Jul 19, 1961            BSA:          1.878 m Patient Age:    62 years            BP:           113/65 mmHg Patient Gender: F                   HR:           71 bpm. Exam Location:  [facility] Procedure: 2D Echo Indications:    Elevated Troponin  History:        Patient has prior history of Echocardiogram examinations, most                 recent 06/12/2018. Risk Factors:Hypertension, Current Smoker and                 Dyslipidemia. Polypharmacy, GERD.  Sonographer:    Leavy Cella RDCS (AE) Referring Phys: [phone] DAVID TAT IMPRESSIONS  1. Left ventricular ejection fraction, by estimation, is 70 to 75%. The left ventricle has hyperdynamic function. The left ventricle has no regional wall motion abnormalities. There is moderate to severe left ventricular hypertrophy. No significant LVOT  gradient. Left ventricular diastolic parameters are indeterminate.  2. Right ventricular systolic function is normal. The right ventricular size is normal. Tricuspid regurgitation signal is inadequate for assessing PA pressure.  3. The mitral valve is grossly normal. Trivial mitral valve regurgitation.  4. The aortic valve is tricuspid. Aortic valve regurgitation is not visualized.  5. The inferior vena cava is normal in size with greater than 50% respiratory variability, suggesting right atrial pressure of 3 mmHg.  6. Prominent pericardial fat pad. FINDINGS  Left Ventricle: Left ventricular ejection fraction, by estimation, is 70 to 75%. The left ventricle has hyperdynamic function. The left ventricle has no regional wall motion abnormalities. The left ventricular internal cavity size was normal in size. There is moderate left ventricular hypertrophy. Left ventricular diastolic parameters are indeterminate. Right Ventricle: The right ventricular size is normal. No increase in right ventricular wall thickness. Right ventricular systolic function is normal. Tricuspid regurgitation signal is inadequate for  assessing PA pressure. Left Atrium: Left atrial size was normal in size. Right Atrium: Right atrial size was normal in size. Pericardium: There is no evidence of pericardial effusion. Presence of pericardial fat pad. Mitral Valve: The mitral valve is grossly normal. Mild mitral annular calcification. Trivial mitral valve regurgitation. Tricuspid Valve: The tricuspid valve is grossly normal. Tricuspid valve regurgitation is trivial. Aortic Valve: The aortic valve is tricuspid. There is mild aortic valve annular calcification. Aortic valve regurgitation is not visualized. Pulmonic Valve: The  pulmonic valve was grossly normal. Pulmonic valve regurgitation is trivial. Aorta: The aortic root is normal in size and structure. Venous: The inferior vena cava is normal in size with greater than 50% respiratory variability, suggesting right atrial pressure of 3 mmHg. IAS/Shunts: No atrial level shunt detected by color flow Doppler.  LEFT VENTRICLE PLAX 2D LVIDd:         3.56 cm  Diastology LVIDs:         2.34 cm  LV e' medial:    3.81 cm/s LV PW:         1.74 cm  LV E/e' medial:  18.1 LV IVS:        1.17 cm  LV e' lateral:   2.92 cm/s LVOT diam:     2.00 cm  LV E/e' lateral: 23.6 LVOT Area:     3.14 cm  RIGHT VENTRICLE RV S prime:     14.10 cm/s TAPSE (M-mode): 1.9 cm LEFT ATRIUM             Index       RIGHT ATRIUM           Index LA diam:        3.90 cm 2.08 cm/m  RA Area:     12.50 cm LA Vol (A2C):   43.3 ml 23.05 ml/m RA Volume:   28.90 ml  15.39 ml/m LA Vol (A4C):   35.1 ml 18.69 ml/m LA Biplane Vol: 42.4 ml 22.58 ml/m   AORTA Ao Root diam: 2.80 cm MITRAL VALVE MV Area (PHT): 2.08 cm     SHUNTS MV Decel Time: 364 msec     Systemic Diam: 2.00 cm MV E velocity: 69.00 cm/s MV A velocity: 108.00 cm/s MV E/A ratio:  0.64 Rozann Lesches MD Electronically signed by Rozann Lesches MD Signature Date/Time: 08/29/2020/11:50:24 AM    Final    US Abdomen Limited RUQ  Result Date: 09/07/2020 CLINICAL DATA:  Epigastric  pain.  Previous cholecystectomy. EXAM: ULTRASOUND ABDOMEN LIMITED RIGHT UPPER QUADRANT COMPARISON:  CT scan August 29, 2020. FINDINGS: Gallbladder: Surgically absent. Common bile duct: Diameter: 9.3 mm Liver: Increased echogenicity with no focal mass. Portal vein is patent on color Doppler imaging with normal direction of blood flow towards the liver. Other: None. IMPRESSION: 1. The gallbladder is surgically absent. 2. The common bile duct measures 9.3 mm which is with in expected range given previous cholecystectomy. If labs are clinical picture suggest the possibility of biliary obstruction, an MRCP could better evaluate. 3. Hepatic steatosis. Electronically Signed   By: Dorise Bullion III M.D   On: 09/07/2020 17:08    Microbiology: Recent Results (from the past 240 hour(s))  SARS Coronavirus 2 by RT PCR (hospital order, performed in [facility] hospital lab) Nasopharyngeal Nasopharyngeal Swab     Status: None   Collection Time: 09/09/20 12:14 PM   Specimen: Nasopharyngeal Swab  Result Value Ref Range Status   SARS Coronavirus 2 NEGATIVE NEGATIVE Final    Comment: (NOTE) SARS-CoV-2 target nucleic acids are NOT DETECTED.  The SARS-CoV-2 RNA is generally detectable in upper and lower respiratory specimens during the acute phase of infection. The lowest concentration of SARS-CoV-2 viral copies this assay can detect is 250 copies / mL. A negative result does not preclude SARS-CoV-2 infection and should not be used as the sole basis for treatment or other patient management decisions.  A negative result may occur with improper specimen collection / handling, submission of specimen other than nasopharyngeal swab, presence of  viral mutation(s) within the areas targeted by this assay, and inadequate number of viral copies (<250 copies / mL). A negative result must be combined with clinical observations, patient history, and epidemiological information.  Fact Sheet for Patients:     StrictlyIdeas.no  Fact Sheet for Healthcare Providers: BankingDealers.co.za  This test is not yet approved or  cleared by the [place] FDA and has been authorized for detection and/or diagnosis of SARS-CoV-2 by FDA under an Emergency Use Authorization (EUA).  This EUA will remain in effect (meaning this test can be used) for the duration of the COVID-19 declaration under Section 564(b)(1) of the Act, 21 U.S.C. section 360bbb-3(b)(1), unless the authorization is terminated or revoked sooner.  Performed at [facility], [address], [place], [place] [address]      Labs: Basic Metabolic Panel: Recent Labs  Lab 09/03/20 [phone] 09/03/20 0659 09/04/20 0256 09/04/20 0256 09/05/20 0740 09/06/20 0723 09/07/20 0811 09/08/20 0658 09/09/20 0445  NA 136   < > 135  --  136 135 134* 136  --   K 4.0   < > 3.0*  --  2.5* 3.8 4.2 3.6  --   CL 111   < > 108  --  109 108 105 110  --   CO2 18*   < > 20*  --  20* 21* 22 19*  --   GLUCOSE 160*   < > 118*  --  124* 141* 131* 101*  --   BUN 6*   < > <5*  --  5* <5* <5* <5*  --   CREATININE 0.61   < > 0.60  --  0.54 0.62 0.68 0.64  --   CALCIUM 7.5*   < > 7.5*  --  6.9* 7.9* 8.4* 7.2*  --   MG 2.0   < > 1.7   < > 1.6* 2.1 1.8 1.4* 2.5*  PHOS 1.8*  --  2.4*  --  2.2* 3.3  --   --   --    < > = values in this interval not displayed.   Liver Function Tests: No results for input(s): AST, ALT, ALKPHOS, BILITOT, PROT, ALBUMIN in the last 168 hours. No results for input(s): LIPASE, AMYLASE in the last 168 hours. No results for input(s): AMMONIA in the last 168 hours. CBC: Recent Labs  Lab 09/04/20 0256 09/05/20 0740 09/06/20 0723 09/07/20 0811 09/08/20 0658  WBC 4.2 3.6* 4.2 4.6 4.0  NEUTROABS 1.9 1.7 2.4 2.6 2.2  HGB 11.8* 10.4* 12.4 12.1 10.8*  HCT 35.7* 31.3* 38.0 37.2 33.1*  MCV 106.9* 107.2* 106.7* 106.9* 108.9*  PLT 64* 71* 90* 103* 100*   Cardiac Enzymes: No results for  input(s): CKTOTAL, CKMB, CKMBINDEX, TROPONINI in the last 168 hours. BNP: BNP (last 3 results) No results for input(s): BNP in the last 8760 hours.  ProBNP (last 3 results) No results for input(s): PROBNP in the last 8760 hours.  CBG: No results for input(s): GLUCAP in the last 168 hours.  Active Problems:   Hypothyroidism   Hypokalemia   HTN (hypertension)   Polypharmacy   Cirrhosis of liver without ascites (HCC)   Prediabetes   Thrombocytopenia (HCC)   Acute metabolic encephalopathy   Time coordinating discharge: 38 minutes.  Signed:        Gracee Ratterree, DO [facility]  09/09/2020, 2:14 PM

## 2020-09-09 NOTE — Progress Notes (Signed)
Physical Therapy Treatment Patient Details Name: Deborah Russo MRN: [id number] DOB: 04-Aug-1961 Today's Date: 09/09/2020    History of Present Illness 59 year old female with a history of stroke, chronic nausea and vomiting and abdominal pain, hypothyroidism, low B12, thrombocytopenia, hyperlipidemia, hypertension, GERD, prediabetes presenting with altered mental status.  Unfortunately, the patient is unable to provide any significant history secondary to her altered mental status.  Attempts were made to contact the patient's family without any answer.  Apparently, the patient lives with caregivers that assist with her activities of daily living.  Apparently, one of the caregivers was gone for the weekend, and the other caregiver came by to check up on the patient and found the patient lying in her urine and feces.  She was confused.  EMS was activated.  The patient was awake and alert, but slow to respond and confused during my examination.  Review of systems was extremely limited.  She did deny any headache, visual disturbance, chest pain, shortness breath, abdominal pain.    PT Comments    Patient demonstrates increased endurance/distance for taking steps, able to transfer to commode in bathroom with frequent verbal/tactile cueing for proper hand placement, limited for ambulation due to c/o fatigue and severe low back pain.  Patient tolerated sitting up in chair after therapy - nursing staff notified.  Patient will benefit from continued physical therapy in hospital and recommended venue below to increase strength, balance, endurance for safe ADLs and gait.    Follow Up Recommendations  SNF     Equipment Recommendations  None recommended by PT    Recommendations for Other Services       Precautions / Restrictions Precautions Precautions: Fall Restrictions Weight Bearing Restrictions: No    Mobility  Bed Mobility Overal bed mobility: Needs Assistance Bed Mobility: Sit to  Supine     Supine to sit: Min assist;HOB elevated     General bed mobility comments: slow labored movement  Transfers Overall transfer level: Needs assistance Equipment used: Rolling walker (2 wheeled) Transfers: Sit to/from [facility] Sit to Stand: Min assist Stand pivot transfers: Min assist       General transfer comment: fair/good return for transferring to commode in bathroom requiring verbal/tactile cueing for proper hand placement  Ambulation/Gait Ambulation/Gait assistance: Min assist Gait Distance (Feet): 15 Feet Assistive device: Rolling walker (2 wheeled) Gait Pattern/deviations: Decreased step length - right;Decreased step length - left;Decreased stride length Gait velocity: decreased   General Gait Details: slow labored unsteady cadence without loss of balance, limited secondary to fatigue and low back pain   Stairs             Wheelchair Mobility    Modified Rankin (Stroke Patients Only)       Balance Overall balance assessment: Needs assistance Sitting-balance support: Feet supported;No upper extremity supported Sitting balance-Leahy Scale: Fair Sitting balance - Comments: fair/good seated at EOB   Standing balance support: During functional activity;Bilateral upper extremity supported Standing balance-Leahy Scale: Fair Standing balance comment: using RW                            Cognition Arousal/Alertness: Awake/alert Behavior During Therapy: WFL for tasks assessed/performed Overall Cognitive Status: No family/caregiver present to determine baseline cognitive functioning  Exercises General Exercises - Lower Extremity Ankle Circles/Pumps: Seated;AROM;Both;Strengthening;10 reps Long Arc Quad: AROM;Strengthening;Both;10 reps;Seated Hip Flexion/Marching: AROM;Strengthening;Both;10 reps;Seated    General Comments        Pertinent Vitals/Pain Pain  Assessment: Faces Faces Pain Scale: Hurts whole lot Pain Location: low back Pain Descriptors / Indicators: Aching;Sore;Grimacing Pain Intervention(s): Limited activity within patient's tolerance;Monitored during session;Premedicated before session;Patient requesting pain meds-RN notified    Home Living                      Prior Function            PT Goals (current goals can now be found in the care plan section) Acute Rehab PT Goals Patient Stated Goal: return home after rehab PT Goal Formulation: With patient Time For Goal Achievement: 09/12/20 Progress towards PT goals: Progressing toward goals    Frequency    Min 3X/week      PT Plan Current plan remains appropriate    Co-evaluation              AM-PAC PT "6 Clicks" Mobility   Outcome Measure  Help needed turning from your back to your side while in a flat bed without using bedrails?: A Little Help needed moving from lying on your back to sitting on the side of a flat bed without using bedrails?: A Little Help needed moving to and from a bed to a chair (including a wheelchair)?: A Little Help needed standing up from a chair using your arms (e.g., wheelchair or bedside chair)?: A Little Help needed to walk in hospital room?: A Lot Help needed climbing 3-5 steps with a railing? : A Lot 6 Click Score: 16    End of Session   Activity Tolerance: Patient tolerated treatment well;Patient limited by fatigue Patient left: in chair;with call bell/phone within reach;with chair alarm set Nurse Communication: Mobility status PT Visit Diagnosis: Unsteadiness on feet (R26.81);Other abnormalities of gait and mobility (R26.89);Muscle weakness (generalized) (M62.81)     Time: 3967-2897 PT Time Calculation (min) (ACUTE ONLY): 28 min  Charges:  $Therapeutic Exercise: 8-22 mins $Therapeutic Activity: 8-22 mins                     10:57 AM, 09/09/20 Lonell Grandchild, MPT Physical Therapist with [facility] [phone] office [phone] mobile phone

## 2020-09-10 ENCOUNTER — Telehealth: Payer: Self-pay

## 2020-09-10 ENCOUNTER — Other Ambulatory Visit: Payer: Self-pay

## 2020-09-10 LAB — POTASSIUM, STOOL: Potassium, Stl: 136 mmol/L

## 2020-09-10 LAB — SODIUM, STOOL: Sodium, Stl: <20 mmol/L

## 2020-09-10 NOTE — Telephone Encounter (Signed)
[place] called to get this patient scheduled here for elevated TSH. Do you want me to schedule her with [facility]? She was in [facility].  Lamount Cohen [phone]

## 2020-09-10 NOTE — Telephone Encounter (Signed)
I think I should see this patient.

## 2020-09-10 NOTE — Telephone Encounter (Signed)
Tried to call 3x , can not get through to speak with Arbie Cookey

## 2020-09-10 NOTE — Patient Outreach (Signed)
[facility]) Care Management  09/10/2020  DEBY ADGER 05/17/1961 [id number]   Referral Date: 09/10/20 Referral Source: [employer] Report Date of Discharge: 09/09/20 Facility:  [place]: [facility]   Referral received.  No outreach warranted at this time.  Transition of Care calls being completed via EMMI. RN CM will outreach patient for any red flags received.    Plan: RN CM will close case.    Jone Baseman, RN, MSN [facility] Care Management Care Management Coordinator Direct Line [phone] Toll Free: [phone]  Fax: [phone]

## 2020-09-11 ENCOUNTER — Other Ambulatory Visit: Payer: Self-pay

## 2020-09-11 ENCOUNTER — Ambulatory Visit (INDEPENDENT_AMBULATORY_CARE_PROVIDER_SITE_OTHER): Payer: Medicare HMO | Admitting: Gastroenterology

## 2020-09-11 ENCOUNTER — Encounter (INDEPENDENT_AMBULATORY_CARE_PROVIDER_SITE_OTHER): Payer: Self-pay | Admitting: Gastroenterology

## 2020-09-11 VITALS — BP 167/102 | HR 67 | Temp 96.7°F | Ht 62.0 in

## 2020-09-11 DIAGNOSIS — A09 Infectious gastroenteritis and colitis, unspecified: Secondary | ICD-10-CM

## 2020-09-11 DIAGNOSIS — E559 Vitamin D deficiency, unspecified: Secondary | ICD-10-CM | POA: Diagnosis not present

## 2020-09-11 DIAGNOSIS — K909 Intestinal malabsorption, unspecified: Secondary | ICD-10-CM

## 2020-09-11 DIAGNOSIS — K529 Noninfective gastroenteritis and colitis, unspecified: Secondary | ICD-10-CM | POA: Diagnosis not present

## 2020-09-11 LAB — FECAL FAT, QUALITATIVE
Fat Qual Neutral, Stl: NORMAL
Fat Qual Total, Stl: NORMAL

## 2020-09-11 MED ORDER — ERGOCALCIFEROL 50 MCG (2000 UT) PO CAPS: 1.0000 | ORAL_CAPSULE | Freq: Every day | ORAL | 5 refills | Status: DC

## 2020-09-11 NOTE — Patient Instructions (Signed)
Schedule CT enterography Perform blood workup Start Vitamin D supplementation Continue colestid 1 g BID Continue loperamide 2 mg q6h as needed for diarrhea Follow up with endocrinoilogy regarding high TSH Follow up with [facility] regarding chronic pain

## 2020-09-11 NOTE — Progress Notes (Signed)
[name], M.D. Gastroenterology & Hepatology [facility] [address] [place],  [address]  Primary Care Physician: Baruch Gouty, FNP (Inactive) No address on file  I will communicate my assessment and recommendations to the referring MD via EMR. "Note: Occasional unusual wording and randomly placed punctuation marks may result from the use of speech recognition technology to transcribe this document"  Problems: 1. Chronic diarrhea -unclear etiology 2. Malabsorption 3. New onset small ascites  History of Present Illness: Deborah Russo is a 59 y.o. female with history of chronic abdominal pain, hypothyroidism, hypertension, GERD, prediabetes, OSA, stroke, substance abuse, history of C. difficile colitis, chronic abdominal pain, who presents for follow up after recent hospitalization for severe diarrhea.  Patient was hospitalized on 08/28/2020 after being found by caregiver in a pool of urine and feces.  She was found to have major electrolyte derangements in the ER and borderline hypotensive.  Underwent an extensive evaluation during her hospitalization which included CT of the abdomen which showed presence of possible pancolitis, for which a colonoscopy was performed showing flattening of her villi in the terminal ileum and lack of ulcerations in the colon, biopsies showed edema of her mucosa but no major alterations, with negative staining for amyloidosis or virus.  She was found to have elevated TSH of 78 but had negative celiac testing.  Her diarrhea improved with the use of Colestid 1 g twice daily and loperamide 3 times daily.  A fecal fat testing was ordered but is in process.  The patient states that she has had significant d pain in his back.  Distress.  Denies having any episodes of nausea or vomiting but she has not been eating too much.  Since she was transferred to rehabilitation center, the patient has been  taking Colestid twice daily compliantly and Lomotil 2 mg every 6 hours standing with improvement in her symptoms.  She is having 1 bowel movement per day per day patient.  There is a caregiver at her bedside but she reports that she arrived to the nursing facility 2 days ago so she has only been taking care for 1 day.  Otherwise, no presence of blood in her stool, melena, rectal bleeding, abdominal bloating, fever or chills.   Past Medical History: Past Medical History:  Diagnosis Date  . Chronic abdominal pain    since approximately 1991  . Chronic migraine   . Chronic pain   . Hypothyroidism   . Nausea, vomiting, and diarrhea    recurrent, chronic  . Pain management   . Sleep apnea    has CPAP, but doesn't wear it bc she says "i can't sleep with it on"  . Stroke [facility])     Past Surgical History: Past Surgical History:  Procedure Laterality Date  . ABDOMINAL HYSTERECTOMY    . ABDOMINAL SURGERY    . BIOPSY N/A 07/17/2014   Procedure: BIOPSY;  Surgeon: Rogene Houston, MD;  Location: [facility] ORS;  Service: Endoscopy;  Laterality: N/A;  . BIOPSY  06/16/2018   Procedure: BIOPSY;  Surgeon: [name], MD;  Location: [facility] ENDOSCOPY;  Service: Endoscopy;;  . BIOPSY  09/03/2020   Procedure: BIOPSY;  Surgeon: Harvel Quale, MD;  Location: [facility] ENDO SUITE;  Service: Gastroenterology;;  terminal ileum, random,anal  . BREAST SURGERY Left    removed nipple  . CESAREAN SECTION    . CHOLECYSTECTOMY    . COLONOSCOPY WITH PROPOFOL  09/03/2020   Procedure: COLONOSCOPY WITH  PROPOFOL;  Surgeon: Montez Morita, Quillian Quince, MD;  Location: [facility] ENDO SUITE;  Service: Gastroenterology;;  . ESOPHAGOGASTRODUODENOSCOPY N/A 07/17/2014   Procedure: ESOPHAGOGASTRODUODENOSCOPY (EGD);  Surgeon: Rogene Houston, MD;  Location: [facility] ORS;  Service: Endoscopy;  Laterality: N/A;  . FLEXIBLE SIGMOIDOSCOPY N/A 06/16/2018   Procedure: FLEXIBLE SIGMOIDOSCOPY;  Surgeon: [name], MD;  Location: [facility] ENDOSCOPY;   Service: Endoscopy;  Laterality: N/A;    Family History: Family History  Problem Relation Age of Onset  . Asthma Mother   . Cancer Father     Social History: Social History   Tobacco Use  Smoking Status Current Every Day Smoker  . Packs/day: 1.00  . Years: 41.00  . Pack years: 41.00  . Types: Cigarettes  Smokeless Tobacco Never Used   Social History   Substance and Sexual Activity  Alcohol Use No   Social History   Substance and Sexual Activity  Drug Use Yes  . Frequency: 2.0 times per week  . Types: Oxycodone    Allergies: Allergies  Allergen Reactions  . Penicillins Hives    *Tolerates zosyn Has patient had a PCN reaction causing immediate rash, facial/tongue/throat swelling, SOB or lightheadedness with hypotension: No Has patient had a PCN reaction causing severe rash involving mucus membranes or skin necrosis: No Has patient had a PCN reaction that required hospitalization No Has patient had a PCN reaction occurring within the last 10 years: No If all of the above answers are "NO", then may proceed with Cephalosporin use.   . Sulfa Antibiotics Hives    Medications: Current Outpatient Medications  Medication Sig Dispense Refill  . Ascorbic Acid (VITAMIN C WITH ROSE HIPS) 500 MG tablet Take 500 mg by mouth 2 (two) times daily.    Marland Kitchen aspirin 325 MG tablet Take 1 tablet (325 mg total) by mouth daily. 100 tablet 0  . colestipol (COLESTID) 1 g tablet Take 1 tablet (1 g total) by mouth 2 (two) times daily. 60 tablet 0  . cyanocobalamin (,VITAMIN B-12,) 1000 MCG/ML injection Inject 1 mL (1,000 mcg total) into the muscle once a week. 1 mL 0  . diazepam (VALIUM) 5 MG tablet Take 1 tablet (5 mg total) by mouth every 8 (eight) hours as needed for anxiety. 30 tablet 0  . folic acid (FOLVITE) 1 MG tablet Take 1 tablet (1 mg total) by mouth daily. 30 tablet 0  . levothyroxine (SYNTHROID) 150 MCG tablet Take 1 tablet (150 mcg total) by mouth daily. (Needs to be seen before  next refill) 30 tablet 0  . liver oil-zinc oxide (DESITIN) 40 % ointment Apply topically as needed for irritation. 56.7 g 0  . loperamide (IMODIUM) 2 MG capsule Take 1 capsule (2 mg total) by mouth 4 (four) times daily. 30 capsule 0  . nystatin cream (MYCOSTATIN) Apply 1 application topically daily.    Marland Kitchen oxycodone (OXY-IR) 5 MG capsule Take 5 mg by mouth every 4 (four) hours as needed.    Marland Kitchen oxyCODONE (OXYCONTIN) 20 mg 12 hr tablet Take 1 tablet (20 mg total) by mouth every 12 (twelve) hours. 14 tablet 0  . oxyCODONE-acetaminophen (PERCOCET/ROXICET) 5-325 MG tablet Take 1 tablet by mouth every 8 (eight) hours as needed for severe pain. 30 tablet 0  . pantoprazole (PROTONIX) 40 MG tablet Take 1 tablet (40 mg total) by mouth daily. 30 tablet 0  . phosphorus (K PHOS NEUTRAL) 155-852-130 MG tablet Take 2 tablets (500 mg total) by mouth 2 (two) times daily. 60 tablet 0  .  potassium chloride SA (KLOR-CON M20) 20 MEQ tablet Take 2 tablets (40 mEq total) by mouth 2 (two) times daily. 60 tablet 0  . Skin Protectants, Misc. (MINERIN CREME) CREA Apply 1 application topically as needed.    . sodium chloride (OCEAN) 0.65 % SOLN nasal spray Place 1 spray into both nostrils as needed for congestion. 30 mL 0  . thiamine 100 MG tablet Take 1 tablet (100 mg total) by mouth daily. 30 tablet 0  . zinc gluconate 50 MG tablet Take 50 mg by mouth daily.    . Ergocalciferol 50 MCG (2000 UT) CAPS Take 1 each by mouth daily. 30 capsule 5  . vitamin B-12 1000 MCG tablet Take 1 tablet (1,000 mcg total) by mouth daily. (Patient not taking: Reported on 09/11/2020) 30 tablet 0   No current facility-administered medications for this visit.    Review of Systems: GENERAL: negative for malaise, night sweats HEENT: No changes in hearing or vision, no nose bleeds or other nasal problems. NECK: Negative for lumps, goiter, pain and significant neck swelling RESPIRATORY: Negative for cough, wheezing CARDIOVASCULAR: Negative for  chest pain, leg swelling, palpitations, orthopnea GI: SEE HPI MUSCULOSKELETAL: Negative for joint pain or swelling, back pain, and muscle pain. SKIN: Negative for lesions, rash PSYCH: Negative for sleep disturbance, mood disorder and recent psychosocial stressors. HEMATOLOGY Negative for prolonged bleeding, bruising easily, and swollen nodes. ENDOCRINE: Negative for cold or heat intolerance, polyuria, polydipsia and goiter. NEURO: negative for tremor, gait imbalance, syncope and seizures. The remainder of the review of systems is noncontributory.   Physical Exam: BP (!) 167/102 (BP Location: Right Arm, Patient Position: Sitting, Cuff Size: Normal) Comment: Patient has not taken her bp medications this morning.  Pulse 67   Temp (!) 96.7 F (35.9 C) (Temporal)   Ht 5' 2"  (1.575 m)   BMI 35.20 kg/m  GENERAL: The patient is AO x3 slightly somnolent but in no acute distress.  Patient sitting in wheelchair. HEENT: Head is normocephalic and atraumatic. EOMI are intact. Mouth is well hydrated and without lesions. NECK: Supple. No masses LUNGS: Clear to auscultation. No presence of rhonchi/wheezing/rales. Adequate chest expansion HEART: RRR, normal s1 and s2. ABDOMEN: Mildly tender upon palpation of the epigastric area, no guarding, no peritoneal signs, and nondistended. BS +. No masses. EXTREMITIES: Without any cyanosis, clubbing, rash, lesions or edema. NEUROLOGIC: AOx3, no focal motor deficit. SKIN: no jaundice, no rashes  Imaging/Labs: as above  I personally reviewed and interpreted the available labs, imaging and endoscopic files.  Impression and Plan: SABA GOMM is a 59 y.o. female with history of chronic abdominal pain, hypothyroidism, hypertension, GERD, prediabetes, OSA, stroke, substance abuse, history of C. difficile colitis, chronic abdominal pain, who presents for follow up after recent hospitalization for severe diarrhea.  The patient has presented severe symptoms of  diarrhea in the past of unclear etiology as she has underwent extensive investigations during the recent hospitalization but also in hospitalization in 2018 at [place].  Is unclear the etiology of her symptoms.  However, symptomatic management has proven to be beneficial as she is only having a bowel movement per day and is not present any major electrolyte derangements.  I suspect she is having a malabsorptive disorder For which repeat blood testing with a CBC, CMP, magnesium, phosphorus and Zn levels should be checked.  CT enterography will also be ordered.  Eventually if the work-up is negative and she is presenting recurrent symptoms, a repeat EGD will be performed to obtain small  bowel biopsies.  She has evidence of vitamin D deficiency which will be repleted.  She is to follow-up in 4 weeks as we'll need to assess if the current antidiarrheal regimen is working.  -Schedule CT enterography -Check CBC, CMP, magnesium, phosphorus and Zn levels -Start Vitamin D supplementation -Continue colestid 1 g BID -Continue loperamide 2 mg q6h as needed for diarrhea -Follow up with endocrinoilogy regarding high TSH -Follow up with [facility] regarding chronic pain -RTC 4 weeks  All questions were answered.      Harvel Quale, MD Gastroenterology and Hepatology [facility]

## 2020-09-16 DIAGNOSIS — A419 Sepsis, unspecified organism: Secondary | ICD-10-CM | POA: Diagnosis not present

## 2020-09-16 DIAGNOSIS — E876 Hypokalemia: Secondary | ICD-10-CM | POA: Diagnosis not present

## 2020-09-16 DIAGNOSIS — S065X9A Traumatic subdural hemorrhage with loss of consciousness of unspecified duration, initial encounter: Secondary | ICD-10-CM | POA: Diagnosis not present

## 2020-09-16 DIAGNOSIS — R4182 Altered mental status, unspecified: Secondary | ICD-10-CM | POA: Diagnosis not present

## 2020-09-17 DIAGNOSIS — E559 Vitamin D deficiency, unspecified: Secondary | ICD-10-CM | POA: Diagnosis not present

## 2020-09-17 DIAGNOSIS — E039 Hypothyroidism, unspecified: Secondary | ICD-10-CM | POA: Diagnosis not present

## 2020-09-18 ENCOUNTER — Other Ambulatory Visit (INDEPENDENT_AMBULATORY_CARE_PROVIDER_SITE_OTHER): Payer: Self-pay | Admitting: Internal Medicine

## 2020-09-18 LAB — VITAMIN A: Vitamin A (Retinoic Acid): <2.5 ug/dL — ABNORMAL LOW (ref 22.0–69.5)

## 2020-09-18 NOTE — Progress Notes (Unsigned)
Vit A

## 2020-09-19 ENCOUNTER — Ambulatory Visit: Payer: Medicare Other | Admitting: "Endocrinology

## 2020-09-19 DIAGNOSIS — E034 Atrophy of thyroid (acquired): Secondary | ICD-10-CM | POA: Diagnosis not present

## 2020-09-19 DIAGNOSIS — F419 Anxiety disorder, unspecified: Secondary | ICD-10-CM | POA: Diagnosis not present

## 2020-09-19 DIAGNOSIS — G8929 Other chronic pain: Secondary | ICD-10-CM | POA: Diagnosis not present

## 2020-09-19 DIAGNOSIS — L89322 Pressure ulcer of left buttock, stage 2: Secondary | ICD-10-CM | POA: Diagnosis not present

## 2020-09-19 DIAGNOSIS — R197 Diarrhea, unspecified: Secondary | ICD-10-CM | POA: Diagnosis not present

## 2020-09-19 DIAGNOSIS — I10 Essential (primary) hypertension: Secondary | ICD-10-CM | POA: Diagnosis not present

## 2020-09-19 DIAGNOSIS — I69351 Hemiplegia and hemiparesis following cerebral infarction affecting right dominant side: Secondary | ICD-10-CM | POA: Diagnosis not present

## 2020-09-19 DIAGNOSIS — E785 Hyperlipidemia, unspecified: Secondary | ICD-10-CM | POA: Diagnosis not present

## 2020-09-19 DIAGNOSIS — R52 Pain, unspecified: Secondary | ICD-10-CM | POA: Diagnosis not present

## 2020-09-19 DIAGNOSIS — Z79899 Other long term (current) drug therapy: Secondary | ICD-10-CM | POA: Diagnosis not present

## 2020-09-19 DIAGNOSIS — G43719 Chronic migraine without aura, intractable, without status migrainosus: Secondary | ICD-10-CM | POA: Diagnosis not present

## 2020-09-19 DIAGNOSIS — G9341 Metabolic encephalopathy: Secondary | ICD-10-CM | POA: Diagnosis not present

## 2020-09-19 DIAGNOSIS — K7460 Unspecified cirrhosis of liver: Secondary | ICD-10-CM | POA: Diagnosis not present

## 2020-09-19 DIAGNOSIS — E559 Vitamin D deficiency, unspecified: Secondary | ICD-10-CM | POA: Diagnosis not present

## 2020-09-19 DIAGNOSIS — E538 Deficiency of other specified B group vitamins: Secondary | ICD-10-CM | POA: Diagnosis not present

## 2020-09-19 DIAGNOSIS — Z1339 Encounter for screening examination for other mental health and behavioral disorders: Secondary | ICD-10-CM | POA: Diagnosis not present

## 2020-09-19 DIAGNOSIS — Z1331 Encounter for screening for depression: Secondary | ICD-10-CM | POA: Diagnosis not present

## 2020-09-19 DIAGNOSIS — D518 Other vitamin B12 deficiency anemias: Secondary | ICD-10-CM | POA: Diagnosis not present

## 2020-09-19 DIAGNOSIS — E039 Hypothyroidism, unspecified: Secondary | ICD-10-CM | POA: Diagnosis not present

## 2020-09-19 DIAGNOSIS — G9050 Complex regional pain syndrome I, unspecified: Secondary | ICD-10-CM | POA: Diagnosis not present

## 2020-09-22 DIAGNOSIS — G9050 Complex regional pain syndrome I, unspecified: Secondary | ICD-10-CM | POA: Diagnosis not present

## 2020-09-22 DIAGNOSIS — Z1331 Encounter for screening for depression: Secondary | ICD-10-CM | POA: Diagnosis not present

## 2020-09-22 DIAGNOSIS — G43719 Chronic migraine without aura, intractable, without status migrainosus: Secondary | ICD-10-CM | POA: Diagnosis not present

## 2020-09-22 DIAGNOSIS — Z1339 Encounter for screening examination for other mental health and behavioral disorders: Secondary | ICD-10-CM | POA: Diagnosis not present

## 2020-09-30 DIAGNOSIS — R52 Pain, unspecified: Secondary | ICD-10-CM | POA: Diagnosis not present

## 2020-09-30 DIAGNOSIS — F419 Anxiety disorder, unspecified: Secondary | ICD-10-CM | POA: Diagnosis not present

## 2020-09-30 DIAGNOSIS — Z79899 Other long term (current) drug therapy: Secondary | ICD-10-CM | POA: Diagnosis not present

## 2020-09-30 DIAGNOSIS — G8929 Other chronic pain: Secondary | ICD-10-CM | POA: Diagnosis not present

## 2020-10-03 ENCOUNTER — Encounter: Payer: Self-pay | Admitting: "Endocrinology

## 2020-10-03 ENCOUNTER — Other Ambulatory Visit: Payer: Self-pay

## 2020-10-03 ENCOUNTER — Ambulatory Visit (INDEPENDENT_AMBULATORY_CARE_PROVIDER_SITE_OTHER): Payer: Medicare HMO | Admitting: "Endocrinology

## 2020-10-03 VITALS — BP 90/62 | HR 68 | Ht 62.0 in | Wt 139.0 lb

## 2020-10-03 DIAGNOSIS — R197 Diarrhea, unspecified: Secondary | ICD-10-CM | POA: Diagnosis not present

## 2020-10-03 DIAGNOSIS — E034 Atrophy of thyroid (acquired): Secondary | ICD-10-CM

## 2020-10-03 DIAGNOSIS — E039 Hypothyroidism, unspecified: Secondary | ICD-10-CM | POA: Diagnosis not present

## 2020-10-03 DIAGNOSIS — E559 Vitamin D deficiency, unspecified: Secondary | ICD-10-CM | POA: Diagnosis not present

## 2020-10-03 DIAGNOSIS — D518 Other vitamin B12 deficiency anemias: Secondary | ICD-10-CM | POA: Diagnosis not present

## 2020-10-03 DIAGNOSIS — G9341 Metabolic encephalopathy: Secondary | ICD-10-CM | POA: Diagnosis not present

## 2020-10-03 MED ORDER — LEVOTHYROXINE SODIUM 125 MCG PO TABS
125.0000 ug | ORAL_TABLET | Freq: Every day | ORAL | 1 refills | Status: DC
Start: 2020-10-03 — End: 2021-07-13

## 2020-10-03 NOTE — Progress Notes (Signed)
Endocrinology Consult Note                                         10/03/2020, 12:34 PM   Deborah NUSSER is a 59 y.o.-year-old female patient being seen in consultation for hypothyroidism referred by Baruch Gouty, FNP (Inactive).   Past Medical History:  Diagnosis Date  . Chronic abdominal pain    since approximately 1991  . Chronic migraine   . Chronic pain   . Hypothyroidism   . Nausea, vomiting, and diarrhea    recurrent, chronic  . Pain management   . Sleep apnea    has CPAP, but doesn't wear it bc she says "i can't sleep with it on"  . Stroke [facility])     Past Surgical History:  Procedure Laterality Date  . ABDOMINAL HYSTERECTOMY    . ABDOMINAL SURGERY    . BIOPSY N/A 07/17/2014   Procedure: BIOPSY;  Surgeon: Rogene Houston, MD;  Location: [facility] ORS;  Service: Endoscopy;  Laterality: N/A;  . BIOPSY  06/16/2018   Procedure: BIOPSY;  Surgeon: [name], MD;  Location: [facility] ENDOSCOPY;  Service: Endoscopy;;  . BIOPSY  09/03/2020   Procedure: BIOPSY;  Surgeon: Harvel Quale, MD;  Location: [facility] ENDO SUITE;  Service: Gastroenterology;;  terminal ileum, random,anal  . BREAST SURGERY Left    removed nipple  . CESAREAN SECTION    . CHOLECYSTECTOMY    . COLONOSCOPY WITH PROPOFOL  09/03/2020   Procedure: COLONOSCOPY WITH PROPOFOL;  Surgeon: Harvel Quale, MD;  Location: [facility] ENDO SUITE;  Service: Gastroenterology;;  . ESOPHAGOGASTRODUODENOSCOPY N/A 07/17/2014   Procedure: ESOPHAGOGASTRODUODENOSCOPY (EGD);  Surgeon: Rogene Houston, MD;  Location: [facility] ORS;  Service: Endoscopy;  Laterality: N/A;  . FLEXIBLE SIGMOIDOSCOPY N/A 06/16/2018   Procedure: FLEXIBLE SIGMOIDOSCOPY;  Surgeon: [name], MD;  Location: [facility] ENDOSCOPY;  Service: Endoscopy;  Laterality: N/A;    Social History   Socioeconomic History  . Marital status: Divorced    Spouse name: Not on file  . Number of  children: Not on file  . Years of education: Not on file  . Highest education level: Not on file  Occupational History  . Not on file  Tobacco Use  . Smoking status: Current Every Day Smoker    Packs/day: 1.00    Years: 41.00    Pack years: 41.00    Types: Cigarettes  . Smokeless tobacco: Never Used  Vaping Use  . Vaping Use: Never used  Substance and Sexual Activity  . Alcohol use: No  . Drug use: Yes    Frequency: 2.0 times per week    Types: Oxycodone  . Sexual activity: Not Currently  Other Topics Concern  . Not on file  Social History Narrative  . Not on file   Social Determinants of Health   Financial Resource Strain:   . Difficulty of Paying Living Expenses: Not on file  Food Insecurity:   .  Worried About Charity fundraiser in the Last Year: Not on file  . Ran Out of Food in the Last Year: Not on file  Transportation Needs:   . Lack of Transportation (Medical): Not on file  . Lack of Transportation (Non-Medical): Not on file  Physical Activity:   . Days of Exercise per Week: Not on file  . Minutes of Exercise per Session: Not on file  Stress:   . Feeling of Stress : Not on file  Social Connections:   . Frequency of Communication with Friends and Family: Not on file  . Frequency of Social Gatherings with Friends and Family: Not on file  . Attends Religious Services: Not on file  . Active Member of Clubs or Organizations: Not on file  . Attends Archivist Meetings: Not on file  . Marital Status: Not on file    Family History  Problem Relation Age of Onset  . Asthma Mother   . Diabetes Mother   . Cancer Father     Outpatient Encounter Medications as of 10/03/2020  Medication Sig  . Beta Carotene (VITAMIN A) 25000 UNIT capsule Take 25,000 Units by mouth daily.  . Cholecalciferol (VITAMIN D3) 75 MCG (3000 UT) TABS Take 1 tablet by mouth daily.  . Emollient (EUCERIN PLUS) 5-5 % LOTN Apply topically.  Marland Kitchen levothyroxine (SYNTHROID) 125 MCG tablet  Take 1 tablet (125 mcg total) by mouth daily before breakfast.  . Multiple Vitamins-Minerals (CERTAGEN PO) Take 1 tablet by mouth daily.  . [DISCONTINUED] levothyroxine (SYNTHROID) 175 MCG tablet Take 175 mcg by mouth daily before breakfast.  . Ascorbic Acid (VITAMIN C WITH ROSE HIPS) 500 MG tablet Take 500 mg by mouth 2 (two) times daily.  Marland Kitchen aspirin 325 MG tablet Take 1 tablet (325 mg total) by mouth daily.  . colestipol (COLESTID) 1 g tablet Take 1 tablet (1 g total) by mouth 2 (two) times daily.  . cyanocobalamin (,VITAMIN B-12,) 1000 MCG/ML injection Inject 1 mL (1,000 mcg total) into the muscle once a week.  . diazepam (VALIUM) 5 MG tablet Take 1 tablet (5 mg total) by mouth every 8 (eight) hours as needed for anxiety.  . folic acid (FOLVITE) 1 MG tablet Take 1 tablet (1 mg total) by mouth daily.  Marland Kitchen liver oil-zinc oxide (DESITIN) 40 % ointment Apply topically as needed for irritation.  Marland Kitchen loperamide (IMODIUM) 2 MG capsule Take 1 capsule (2 mg total) by mouth 4 (four) times daily.  Marland Kitchen nystatin cream (MYCOSTATIN) Apply 1 application topically daily.  Marland Kitchen oxyCODONE (OXYCONTIN) 20 mg 12 hr tablet Take 1 tablet (20 mg total) by mouth every 12 (twelve) hours.  Marland Kitchen oxyCODONE-acetaminophen (PERCOCET/ROXICET) 5-325 MG tablet Take 1 tablet by mouth every 8 (eight) hours as needed for severe pain.  . pantoprazole (PROTONIX) 40 MG tablet Take 1 tablet (40 mg total) by mouth daily.  . phosphorus (K PHOS NEUTRAL) 155-852-130 MG tablet Take 2 tablets (500 mg total) by mouth 2 (two) times daily.  . potassium chloride SA (KLOR-CON M20) 20 MEQ tablet Take 2 tablets (40 mEq total) by mouth 2 (two) times daily.  . Skin Protectants, Misc. (MINERIN CREME) CREA Apply 1 application topically as needed.  . sodium chloride (OCEAN) 0.65 % SOLN nasal spray Place 1 spray into both nostrils as needed for congestion.  . thiamine 100 MG tablet Take 1 tablet (100 mg total) by mouth daily.  . vitamin B-12 1000 MCG tablet Take 1  tablet (1,000 mcg total) by mouth  daily. (Patient not taking: Reported on 09/11/2020)  . zinc gluconate 50 MG tablet Take 50 mg by mouth daily.  . [DISCONTINUED] Ergocalciferol 50 MCG (2000 UT) CAPS Take 1 each by mouth daily.  . [DISCONTINUED] levothyroxine (SYNTHROID) 150 MCG tablet Take 1 tablet (150 mcg total) by mouth daily. (Needs to be seen before next refill)  . [DISCONTINUED] oxycodone (OXY-IR) 5 MG capsule Take 5 mg by mouth every 4 (four) hours as needed.   No facility-administered encounter medications on file as of 10/03/2020.    ALLERGIES: Allergies  Allergen Reactions  . Penicillins Hives    *Tolerates zosyn Has patient had a PCN reaction causing immediate rash, facial/tongue/throat swelling, SOB or lightheadedness with hypotension: No Has patient had a PCN reaction causing severe rash involving mucus membranes or skin necrosis: No Has patient had a PCN reaction that required hospitalization No Has patient had a PCN reaction occurring within the last 10 years: No If all of the above answers are "NO", then may proceed with Cephalosporin use.   . Sulfa Antibiotics Hives   VACCINATION STATUS: There is no immunization history for the selected administration types on file for this patient.   HPI    Deborah Russo  is a patient with the above medical history. she was diagnosed  with hypothyroidism at approximate age of 66 years . She was treated with various dose of levothyroxine over the years.  When she was seen here last time in 2019 she was put on levothyroxine 125 mcg p.o. daily before breakfast.  She failed to show up for follow-up.  Recently was hospitalized, and was found to have high TSH of 78.  Her levothyroxine was increased to 175 mcg p.o. daily.  Patient however weight is much less than last time-30 pounds lighter.  She is still wheelchair-bound.  Currently in nursing home, accompanied by her aide.  -She describes fatigue, heat intolerance. Patient  denies  feeling nodules in neck, hoarseness, dysphagia/odynophagia, SOB with lying down.  she denies family history of  thyroid disorders .  No family history of thyroid cancer.  She denies any prior history of thyroid surgery no treatment with radioactive iodine.   I reviewed her chart and she also has a history of hypertension, hyperlipidemia, aortic atherosclerosis, type 2 diabetes, recurrent CVA, cirrhosis of the liver, chronic heavy smoking.   ROS:  Constitutional: +  weight loss, + fatigue, + subjective hyperthermia Eyes: no blurry vision, no xerophthalmia ENT: no sore throat, no nodules palpated in throat, no dysphagia/odynophagia, no hoarseness Cardiovascular: no Chest Pain, no Shortness of Breath, no palpitations, no leg swelling Respiratory: no cough, no SOB Gastrointestinal: no Nausea/Vomiting/Diarhhea Musculoskeletal:  + Wheelchair-bound, no muscle/joint aches Skin: no rashes Neurological: no tremors, no numbness, no tingling, no dizziness Psychiatric: no depression, no anxiety   Physical Exam: BP 90/62   Pulse 68   Ht 5' 2"  (1.575 m)   Wt 139 lb (63 kg)   BMI 25.42 kg/m  Wt Readings from Last 3 Encounters:  10/03/20 139 lb (63 kg)  09/04/20 192 lb 7.4 oz (87.3 kg)  02/06/19 191 lb 8 oz (86.9 kg)    Constitutional:  Body mass index is 25.42 kg/m., not in acute distress, normal state of mind Eyes: PERRLA, EOMI, no exophthalmos ENT: moist mucous membranes, no thyromegaly, no cervical lymphadenopathy Cardiovascular: normal precordial activity, Regular Rate and Rhythm, no Murmur/Rubs/Gallops Respiratory:  adequate breathing efforts, no gross chest deformity, Clear to auscultation bilaterally Gastrointestinal: abdomen soft, Non -tender, No distension,  Bowel Sounds present Musculoskeletal:  + Wheelchair-bound due to previous CVA, deconditioning, disequilibrium no gross deformities, strength intact in all four extremities Skin: moist, warm, no rashes Neurological: no tremor  with outstretched hands, Deep tendon reflexes normal in all four extremities.   CMP ( most recent) CMP     Component Value Date/Time   NA 136 09/08/2020 0658   NA 135 02/06/2019 1632   K 3.6 09/08/2020 0658   CL 110 09/08/2020 0658   CO2 19 (L) 09/08/2020 0658   GLUCOSE 101 (H) 09/08/2020 0658   BUN <5 (L) 09/08/2020 0658   BUN 13 02/06/2019 1632   CREATININE 0.64 09/08/2020 0658   CALCIUM 7.2 (L) 09/08/2020 0658   CALCIUM 7.2 (L) 06/14/2018 0417   PROT 6.0 (L) 08/30/2020 0745   PROT 7.9 02/06/2019 1632   ALBUMIN 2.9 (L) 08/30/2020 0745   ALBUMIN 4.7 02/06/2019 1632   AST 16 08/30/2020 0745   ALT 16 08/30/2020 0745   ALKPHOS 69 08/30/2020 0745   BILITOT 1.3 (H) 08/30/2020 0745   BILITOT 0.4 02/06/2019 1632   GFRNONAA >60 09/08/2020 0658   GFRAA >60 09/08/2020 0658     Diabetic Labs (most recent): Lab Results  Component Value Date   HGBA1C 5.6 08/28/2020   HGBA1C 6.1 (H) 06/19/2018   HGBA1C 5.0 03/29/2017     Lipid Panel ( most recent) Lipid Panel     Component Value Date/Time   CHOL 389 (H) 02/06/2019 1632   TRIG 283 (H) 02/06/2019 1632   HDL 63 02/06/2019 1632   CHOLHDL 6.2 (H) 02/06/2019 1632   CHOLHDL 4.3 03/30/2017 0431   VLDL 39 03/30/2017 0431   LDLCALC 269 (H) 02/06/2019 1632   LABVLDL 57 (H) 02/06/2019 1632       Lab Results  Component Value Date   TSH 78.985 (H) 09/07/2020   TSH 70.684 (H) 09/05/2020   TSH 20.018 (H) 08/28/2020   TSH 296.400 (H) 02/06/2019   TSH 23.50 (A) 08/22/2018   TSH 75.673 (H) 06/17/2018   TSH 80.278 (H) 06/13/2018   TSH 8.128 (H) 06/16/2017   TSH 47.236 (H) 03/30/2017   TSH >90.000 (H) 02/19/2015   FREET4 0.48 (L) 09/07/2020   FREET4 <0.11 (L) 02/06/2019   FREET4 0.59 (L) 06/22/2018   FREET4 0.72 06/16/2017       ASSESSMENT: 1. Hypothyroidism 2.  Chronic heavy smoking 3.  Prediabetes  PLAN:    Patient with long-standing hypothyroidism, on levothyroxine therapy. On physical exam , patient  does not   have  gross goiter, thyroid nodules, or neck compression symptoms. For her current weight, levothyroxine 175 is excessive.  I discussed and lowered her prescription for levothyroxine to 125 mcg p.o. daily before breakfast.  - We discussed about correct intake of levothyroxine, at fasting, with water, separated by at least 30 minutes from breakfast, and separated by more than 4 hours from calcium, iron, multivitamins, acid reflux medications (PPIs). -Patient is made aware of the fact that thyroid hormone replacement is needed for life, dose to be adjusted by periodic monitoring of thyroid function tests. -She will need lab work for thyroid function test, antithyroid antibodies and return visit in 4 weeks. -Due to absence of clinical goiter, no need for thyroid ultrasound.  - Time spent with the patient: 45 minutes, of which >50% was spent in obtaining information about her symptoms, reviewing her previous labs, evaluations, and treatments, counseling her about her longstanding hypothyroidism, and developing a plan to confirm the diagnosis and long term treatment  as necessary. Please refer to " Patient Self Inventory" in the Media  tab for reviewed elements of pertinent patient history.  [name] participated in the discussions, expressed understanding, and voiced agreement with the above plans.  All questions were answered to her satisfaction. she is encouraged to contact clinic should she have any questions or concerns prior to her return visit.  Return in about 4 weeks (around 10/31/2020) for F/U with Pre-visit Labs.  Glade Lloyd, MD [facility] [address] [place],  [address] Phone: [phone]  Fax: [phone]   10/03/2020, 12:34 PM  This note was partially dictated with voice recognition software. Similar sounding words can be transcribed inadequately or may not  be corrected upon review.

## 2020-10-06 ENCOUNTER — Ambulatory Visit (HOSPITAL_COMMUNITY): Payer: Medicare HMO

## 2020-10-06 DIAGNOSIS — G43719 Chronic migraine without aura, intractable, without status migrainosus: Secondary | ICD-10-CM | POA: Diagnosis not present

## 2020-10-06 DIAGNOSIS — G8929 Other chronic pain: Secondary | ICD-10-CM | POA: Diagnosis not present

## 2020-10-08 DIAGNOSIS — E876 Hypokalemia: Secondary | ICD-10-CM | POA: Diagnosis not present

## 2020-10-09 ENCOUNTER — Ambulatory Visit (INDEPENDENT_AMBULATORY_CARE_PROVIDER_SITE_OTHER): Payer: Medicare HMO | Admitting: Gastroenterology

## 2020-10-09 ENCOUNTER — Encounter (INDEPENDENT_AMBULATORY_CARE_PROVIDER_SITE_OTHER): Payer: Self-pay | Admitting: Gastroenterology

## 2020-10-09 ENCOUNTER — Other Ambulatory Visit: Payer: Self-pay

## 2020-10-09 VITALS — BP 103/70 | HR 89 | Temp 97.9°F | Ht 62.0 in | Wt 157.5 lb

## 2020-10-09 DIAGNOSIS — A09 Infectious gastroenteritis and colitis, unspecified: Secondary | ICD-10-CM

## 2020-10-09 DIAGNOSIS — K909 Intestinal malabsorption, unspecified: Secondary | ICD-10-CM

## 2020-10-09 DIAGNOSIS — K529 Noninfective gastroenteritis and colitis, unspecified: Secondary | ICD-10-CM

## 2020-10-09 DIAGNOSIS — R109 Unspecified abdominal pain: Secondary | ICD-10-CM | POA: Diagnosis not present

## 2020-10-09 DIAGNOSIS — E509 Vitamin A deficiency, unspecified: Secondary | ICD-10-CM | POA: Diagnosis not present

## 2020-10-09 DIAGNOSIS — E559 Vitamin D deficiency, unspecified: Secondary | ICD-10-CM

## 2020-10-09 DIAGNOSIS — G8929 Other chronic pain: Secondary | ICD-10-CM

## 2020-10-09 NOTE — Patient Instructions (Addendum)
Perform blood workup Check vitamin D, E and A in 3 months Schedule CT enterography Continue colestid 1 g BID Continue loperamide 2 mg q6h as needed for diarrhea

## 2020-10-09 NOTE — Progress Notes (Signed)
Deborah Russo, M.D. Gastroenterology & Hepatology [facility] [address] [place], [place] [address]  Primary Care Physician: Baruch Gouty, FNP (Inactive) No address on file  I will communicate my assessment and recommendations to the referring MD via EMR. "Note: Occasional unusual wording and randomly placed punctuation marks may result from the use of speech recognition technology to transcribe this document"  Problems: 1. Chronic diarrhea -unclear etiology, improved 2. Intestinal Malabsorption 3. Liposoluble vitamin deficiency  History of Present Illness: Deborah Russo is a 59 y.o. female with history of chronic abdominal pain, hypothyroidism, hypertension, GERD, prediabetes, OSA, stroke, substance abuse, history of C. difficile colitis, chronic abdominal pain, who presents for follow up of chronic diarrhea.  The patient was last seen on 09/11/2020. At that time, the patient was ordered testing for electrolytes, CBC, CMP, and CT enterography was ordered.  She was started on vitamin D supplementation and she was continued on standing Colestid with as needed loperamide.  The patient states that she has felt much better with the current regimen she is taking for her diarrhea.  She states that she has presented normal bowel movements, is having 1 formed bowel movement every day.  Denies having any nausea, vomiting, fever, chills, abdominal distention or changes in her weight.  She has been eating more than in the past which has her happy.  Notably, she endorses having chronic history of epigastric pain which has been managed in [facility], she states that she was started on medication yesterday to control her abdominal pain but she does not remember the name of the medication.  She has had this abdominal pain for the last 30 years.  Notably, the patient was started on vitamin A supplementation recently by [name].  Patient  brings lab reports from rehab facility which showed (09/16/2020), CBC with white cell count of 6.3, hemoglobin of 12.3, platelets 167, CMP with creatinine 1.05, normal sodium of 135, potassium 5.1, albumin 3.7 (improved compared to prior), AST 16, ALT 13, total bilirubin 0.5, alkaline phosphatase mildly increased 212, vitamin B12 1106, vitamin D 20.5  Notably, the patient reports that she has been walking to the bathroom on her own and it has been ambulating more than in the past.  Past Medical History: Past Medical History:  Diagnosis Date  . Chronic abdominal pain    since approximately 1991  . Chronic migraine   . Chronic pain   . Hypothyroidism   . Nausea, vomiting, and diarrhea    recurrent, chronic  . Pain management   . Sleep apnea    has CPAP, but doesn't wear it bc she says "i can't sleep with it on"  . Stroke [facility])     Past Surgical History: Past Surgical History:  Procedure Laterality Date  . ABDOMINAL HYSTERECTOMY    . ABDOMINAL SURGERY    . BIOPSY N/A 07/17/2014   Procedure: BIOPSY;  Surgeon: Rogene Houston, MD;  Location: [facility] ORS;  Service: Endoscopy;  Laterality: N/A;  . BIOPSY  06/16/2018   Procedure: BIOPSY;  Surgeon: [name], MD;  Location: [facility] ENDOSCOPY;  Service: Endoscopy;;  . BIOPSY  09/03/2020   Procedure: BIOPSY;  Surgeon: Harvel Quale, MD;  Location: [facility] ENDO SUITE;  Service: Gastroenterology;;  terminal ileum, random,anal  . BREAST SURGERY Left    removed nipple  . CESAREAN SECTION    . CHOLECYSTECTOMY    . COLONOSCOPY WITH PROPOFOL  09/03/2020   Procedure: COLONOSCOPY WITH PROPOFOL;  Surgeon: Montez Morita, Quillian Quince, MD;  Location: [facility] ENDO SUITE;  Service: Gastroenterology;;  . ESOPHAGOGASTRODUODENOSCOPY N/A 07/17/2014   Procedure: ESOPHAGOGASTRODUODENOSCOPY (EGD);  Surgeon: Rogene Houston, MD;  Location: [facility] ORS;  Service: Endoscopy;  Laterality: N/A;  . FLEXIBLE SIGMOIDOSCOPY N/A 06/16/2018   Procedure: FLEXIBLE SIGMOIDOSCOPY;   Surgeon: [name], MD;  Location: [facility] ENDOSCOPY;  Service: Endoscopy;  Laterality: N/A;    Family History: Family History  Problem Relation Age of Onset  . Asthma Mother   . Diabetes Mother   . Cancer Father     Social History: Social History   Tobacco Use  Smoking Status Current Every Day Smoker  . Packs/day: 1.00  . Years: 41.00  . Pack years: 41.00  . Types: Cigarettes  Smokeless Tobacco Never Used   Social History   Substance and Sexual Activity  Alcohol Use No   Social History   Substance and Sexual Activity  Drug Use Yes  . Frequency: 2.0 times per week  . Types: Oxycodone    Allergies: Allergies  Allergen Reactions  . Penicillins Hives    *Tolerates zosyn Has patient had a PCN reaction causing immediate rash, facial/tongue/throat swelling, SOB or lightheadedness with hypotension: No Has patient had a PCN reaction causing severe rash involving mucus membranes or skin necrosis: No Has patient had a PCN reaction that required hospitalization No Has patient had a PCN reaction occurring within the last 10 years: No If all of the above answers are "NO", then may proceed with Cephalosporin use.   . Sulfa Antibiotics Hives    Medications: Current Outpatient Medications  Medication Sig Dispense Refill  . Ascorbic Acid (VITAMIN C WITH ROSE HIPS) 500 MG tablet Take 500 mg by mouth 2 (two) times daily.    Marland Kitchen aspirin 325 MG tablet Take 1 tablet (325 mg total) by mouth daily. 100 tablet 0  . Beta Carotene (VITAMIN A) 25000 UNIT capsule Take 25,000 Units by mouth daily.    . Cholecalciferol (VITAMIN D3) 75 MCG (3000 UT) TABS Take 1 tablet by mouth daily.    . colestipol (COLESTID) 1 g tablet Take 1 tablet (1 g total) by mouth 2 (two) times daily. 60 tablet 0  . cyanocobalamin (,VITAMIN B-12,) 1000 MCG/ML injection Inject 1 mL (1,000 mcg total) into the muscle once a week. 1 mL 0  . Emollient (EUCERIN PLUS) 5-5 % LOTN Apply topically.    . folic acid (FOLVITE) 1  MG tablet Take 1 tablet (1 mg total) by mouth daily. 30 tablet 0  . levothyroxine (SYNTHROID) 125 MCG tablet Take 1 tablet (125 mcg total) by mouth daily before breakfast. 90 tablet 1  . loperamide (IMODIUM) 2 MG capsule Take 1 capsule (2 mg total) by mouth 4 (four) times daily. 30 capsule 0  . Multiple Vitamins-Minerals (CERTAGEN PO) Take 1 tablet by mouth daily.    Marland Kitchen nystatin cream (MYCOSTATIN) Apply 1 application topically daily.    Marland Kitchen oxyCODONE (OXYCONTIN) 20 mg 12 hr tablet Take 1 tablet (20 mg total) by mouth every 12 (twelve) hours. 14 tablet 0  . oxyCODONE-acetaminophen (PERCOCET/ROXICET) 5-325 MG tablet Take 1 tablet by mouth every 8 (eight) hours as needed for severe pain. 30 tablet 0  . pantoprazole (PROTONIX) 40 MG tablet Take 1 tablet (40 mg total) by mouth daily. 30 tablet 0  . phosphorus (K PHOS NEUTRAL) 155-852-130 MG tablet Take 2 tablets (500 mg total) by mouth 2 (two) times daily. 60 tablet 0  . potassium chloride SA (KLOR-CON M20)  20 MEQ tablet Take 2 tablets (40 mEq total) by mouth 2 (two) times daily. 60 tablet 0  . Skin Protectants, Misc. (MINERIN CREME) CREA Apply 1 application topically as needed.    . sodium chloride (OCEAN) 0.65 % SOLN nasal spray Place 1 spray into both nostrils as needed for congestion. 30 mL 0  . thiamine 100 MG tablet Take 1 tablet (100 mg total) by mouth daily. 30 tablet 0  . vitamin B-12 1000 MCG tablet Take 1 tablet (1,000 mcg total) by mouth daily. 30 tablet 0  . zinc gluconate 50 MG tablet Take 50 mg by mouth daily.    . diazepam (VALIUM) 5 MG tablet Take 1 tablet (5 mg total) by mouth every 8 (eight) hours as needed for anxiety. (Patient not taking: Reported on 10/09/2020) 30 tablet 0  . liver oil-zinc oxide (DESITIN) 40 % ointment Apply topically as needed for irritation. (Patient not taking: Reported on 10/09/2020) 56.7 g 0   No current facility-administered medications for this visit.    Review of Systems: GENERAL: negative for malaise,  night sweats HEENT: No changes in hearing or vision, no nose bleeds or other nasal problems. NECK: Negative for lumps, goiter, pain and significant neck swelling RESPIRATORY: Negative for cough, wheezing CARDIOVASCULAR: Negative for chest pain, leg swelling, palpitations, orthopnea GI: SEE HPI MUSCULOSKELETAL: Negative for joint pain or swelling, back pain, and muscle pain. SKIN: Negative for lesions, rash PSYCH: Negative for sleep disturbance, mood disorder and recent psychosocial stressors. HEMATOLOGY Negative for prolonged bleeding, bruising easily, and swollen nodes. ENDOCRINE: Negative for cold or heat intolerance, polyuria, polydipsia and goiter. NEURO: negative for tremor, gait imbalance, syncope and seizures. The remainder of the review of systems is noncontributory.   Physical Exam: BP 103/70 (BP Location: Left Arm, Patient Position: Sitting, Cuff Size: Normal)   Pulse 89   Temp 97.9 F (36.6 C) (Oral)   Ht 5' 2"  (1.575 m)   Wt 157 lb 8 oz (71.4 kg)   BMI 28.81 kg/m  GENERAL: The patient is AO x3, in no acute distress.  Patient is sitting in wheelchair. HEENT: Head is normocephalic and atraumatic. EOMI are intact. Mouth is well hydrated and without lesions. NECK: Supple. No masses LUNGS: Clear to auscultation. No presence of rhonchi/wheezing/rales. Adequate chest expansion HEART: RRR, normal s1 and s2. ABDOMEN: Mildly tender upon palpation of the epigastric area, no guarding, no peritoneal signs, and nondistended. BS +. No masses. EXTREMITIES: Without any cyanosis, clubbing, rash, lesions or edema. NEUROLOGIC: AOx3, no focal motor deficit. SKIN: no jaundice, no rashes  Imaging/Labs: as above  I personally reviewed and interpreted the available labs, imaging and endoscopic files.  Impression and Plan: Deborah Russo is a 59 y.o. female with history of chronic abdominal pain, hypothyroidism, hypertension, GERD, prediabetes, OSA, stroke, substance abuse, history of  C. difficile colitis, chronic abdominal pain, who presents for follow up of chronic diarrhea.  The patient has presented a chronic history of diarrhea of unclear source.  She has undergone an extensive investigation which has been negative for any specific etiology.  She had evidence of malabsorption based on the liposoluble vitamin deficiency.  It is possible that this is an episode of Brainerd's diarrhea which has been better controlled with her current regiment of Colestid and loperamide as needed.  She should continue this regimen for now.  I would like to perform the blood testing that was ordered in her previous encounter.  We will also schedule a CT enterography.  We will  also need to check vitamin D E and a levels in 3 months.  She will need to follow-up with [facility] regarding her chronic abdominal pain management.  Patient understood and agreed.  -Schedule CT enterography -Check CBC, CMP, magnesium, phosphorus and Zn levels - Check Vitamin D, E and A levels in 3 months -Continue colestid 1 g BID -Continue loperamide 2 mg q6h as needed for diarrhea -Follow up with [facility] regarding chronic pain -RTC 6 months  All questions were answered.      Harvel Quale, MD Gastroenterology and Hepatology [facility]

## 2020-10-13 LAB — COMPREHENSIVE METABOLIC PANEL
AG Ratio: 1.1 (calc) (ref 1.0–2.5)
ALT: 44 U/L — ABNORMAL HIGH (ref 6–29)
AST: 33 U/L (ref 10–35)
Albumin: 3.9 g/dL (ref 3.6–5.1)
Alkaline phosphatase (APISO): 213 U/L — ABNORMAL HIGH (ref 37–153)
BUN: 17 mg/dL (ref 7–25)
CO2: 27 mmol/L (ref 20–32)
Calcium: 9.9 mg/dL (ref 8.6–10.4)
Chloride: 101 mmol/L (ref 98–110)
Creat: 0.76 mg/dL (ref 0.50–0.99)
Globulin: 3.4 g/dL (calc) (ref 1.9–3.7)
Glucose, Bld: 109 mg/dL (ref 65–139)
Potassium: 5.8 mmol/L — ABNORMAL HIGH (ref 3.5–5.3)
Sodium: 139 mmol/L (ref 135–146)
Total Bilirubin: 0.6 mg/dL (ref 0.2–1.2)
Total Protein: 7.3 g/dL (ref 6.1–8.1)

## 2020-10-13 LAB — MAGNESIUM: Magnesium: 1.5 mg/dL (ref 1.5–2.5)

## 2020-10-13 LAB — CBC WITH DIFFERENTIAL/PLATELET
Absolute Monocytes: 415 cells/uL (ref 200–950)
Basophils Absolute: 54 cells/uL (ref 0–200)
Basophils Relative: 0.8 %
Eosinophils Absolute: 80 cells/uL (ref 15–500)
Eosinophils Relative: 1.2 %
HCT: 40.1 % (ref 35.0–45.0)
Hemoglobin: 13.4 g/dL (ref 11.7–15.5)
Lymphs Abs: 1943 cells/uL (ref 850–3900)
MCH: 32.7 pg (ref 27.0–33.0)
MCHC: 33.4 g/dL (ref 32.0–36.0)
MCV: 97.8 fL (ref 80.0–100.0)
MPV: 9.7 fL (ref 7.5–12.5)
Monocytes Relative: 6.2 %
Neutro Abs: 4208 cells/uL (ref 1500–7800)
Neutrophils Relative %: 62.8 %
Platelets: 170 10*3/uL (ref 140–400)
RBC: 4.1 10*6/uL (ref 3.80–5.10)
RDW: 12.5 % (ref 11.0–15.0)
Total Lymphocyte: 29 %
WBC: 6.7 10*3/uL (ref 3.8–10.8)

## 2020-10-13 LAB — VITAMIN E
Gamma-Tocopherol (Vit E): 2.1 mg/L (ref <=4.3)
Vitamin E (Alpha Tocopherol): 9.2 mg/L (ref 5.7–19.9)

## 2020-10-13 LAB — PHOSPHORUS: Phosphorus: 4.3 mg/dL (ref 2.5–4.5)

## 2020-10-13 LAB — ZINC: Zinc: 91 ug/dL (ref 60–130)

## 2020-10-15 DIAGNOSIS — Z79899 Other long term (current) drug therapy: Secondary | ICD-10-CM | POA: Diagnosis not present

## 2020-10-15 DIAGNOSIS — E039 Hypothyroidism, unspecified: Secondary | ICD-10-CM | POA: Diagnosis not present

## 2020-10-15 DIAGNOSIS — E559 Vitamin D deficiency, unspecified: Secondary | ICD-10-CM | POA: Diagnosis not present

## 2020-10-23 DIAGNOSIS — E559 Vitamin D deficiency, unspecified: Secondary | ICD-10-CM | POA: Diagnosis not present

## 2020-10-23 DIAGNOSIS — E039 Hypothyroidism, unspecified: Secondary | ICD-10-CM | POA: Diagnosis not present

## 2020-10-23 DIAGNOSIS — I69351 Hemiplegia and hemiparesis following cerebral infarction affecting right dominant side: Secondary | ICD-10-CM | POA: Diagnosis not present

## 2020-10-23 DIAGNOSIS — R197 Diarrhea, unspecified: Secondary | ICD-10-CM | POA: Diagnosis not present

## 2020-10-24 ENCOUNTER — Telehealth (INDEPENDENT_AMBULATORY_CARE_PROVIDER_SITE_OTHER): Payer: Self-pay | Admitting: Gastroenterology

## 2020-10-24 DIAGNOSIS — E875 Hyperkalemia: Secondary | ICD-10-CM

## 2020-10-24 DIAGNOSIS — R748 Abnormal levels of other serum enzymes: Secondary | ICD-10-CM

## 2020-10-24 NOTE — Telephone Encounter (Signed)
Hi Crystal,  I reviewed the faxed patient's labs from 10/03/2020 which showed a potassium of 4.2 and elevation of the alkaline phosphatase up to 249.  However, her labs that showed elevated alkaline phosphatase and potassium were done on 10/09/2020.  Due to this, we need to reach the facility to make sure they repeat the CMP.  I also added an order for fractionated alkaline phosphatase, IgM and anti mitochondrial antibodies.  Please send this to the rehab facility.  [name], MD Gastroenterology and Hepatology [facility]

## 2020-10-27 NOTE — Telephone Encounter (Signed)
I spoke with Denny Peon the supervisor at the nursing home and she states the patients labs were still pending. I asked if they stopped the patient K supplements she states the patient is still taking K phos. I advised again to stop this medication and any K supplements. I have asked that they add the IgM, Mitochondrial antibodies,and alkaline Phosphatase,Isoenzymes to the labs drawn on 10/23/2020,Per Denny Peon if unable to add they will redraw these other labs. They will forward the CMP once they are complete.

## 2020-10-28 DIAGNOSIS — R197 Diarrhea, unspecified: Secondary | ICD-10-CM | POA: Diagnosis not present

## 2020-10-28 DIAGNOSIS — D7589 Other specified diseases of blood and blood-forming organs: Secondary | ICD-10-CM | POA: Diagnosis not present

## 2020-10-28 DIAGNOSIS — E8809 Other disorders of plasma-protein metabolism, not elsewhere classified: Secondary | ICD-10-CM | POA: Diagnosis not present

## 2020-10-29 DIAGNOSIS — I69351 Hemiplegia and hemiparesis following cerebral infarction affecting right dominant side: Secondary | ICD-10-CM | POA: Diagnosis not present

## 2020-10-29 NOTE — Telephone Encounter (Signed)
I called the nursing home the phone rang until it disconnected the call twice.

## 2020-10-30 DIAGNOSIS — I69351 Hemiplegia and hemiparesis following cerebral infarction affecting right dominant side: Secondary | ICD-10-CM | POA: Diagnosis not present

## 2020-10-30 NOTE — Telephone Encounter (Signed)
Results received and placed on Dr. Sanjuana Letters desk for review. Per [facility] Rossetti floor nurse the K supplements and the K phos have been D/c'd

## 2020-10-31 ENCOUNTER — Telehealth (INDEPENDENT_AMBULATORY_CARE_PROVIDER_SITE_OTHER): Payer: Self-pay

## 2020-10-31 ENCOUNTER — Telehealth: Payer: Medicare HMO | Admitting: "Endocrinology

## 2020-10-31 DIAGNOSIS — I69351 Hemiplegia and hemiparesis following cerebral infarction affecting right dominant side: Secondary | ICD-10-CM | POA: Diagnosis not present

## 2020-11-03 DIAGNOSIS — I69351 Hemiplegia and hemiparesis following cerebral infarction affecting right dominant side: Secondary | ICD-10-CM | POA: Diagnosis not present

## 2020-11-04 DIAGNOSIS — I69351 Hemiplegia and hemiparesis following cerebral infarction affecting right dominant side: Secondary | ICD-10-CM | POA: Diagnosis not present

## 2020-11-05 ENCOUNTER — Ambulatory Visit (HOSPITAL_COMMUNITY): Payer: Medicare HMO

## 2020-11-05 ENCOUNTER — Other Ambulatory Visit (INDEPENDENT_AMBULATORY_CARE_PROVIDER_SITE_OTHER): Payer: Self-pay | Admitting: Gastroenterology

## 2020-11-05 DIAGNOSIS — I69351 Hemiplegia and hemiparesis following cerebral infarction affecting right dominant side: Secondary | ICD-10-CM | POA: Diagnosis not present

## 2020-11-05 DIAGNOSIS — R748 Abnormal levels of other serum enzymes: Secondary | ICD-10-CM

## 2020-11-05 MED ORDER — URSODIOL 250 MG PO TABS: 250.0000 mg | ORAL_TABLET | Freq: Three times a day (TID) | ORAL | 5 refills | Status: AC

## 2020-11-05 NOTE — Telephone Encounter (Signed)
I called Deborah Russo and was transferred to the Nurse line, which kept ringing no answer.

## 2020-11-05 NOTE — Telephone Encounter (Signed)
I tried reaching the nursing home to discuss the results of the findings with the staff.  However, nobody picked up the phone and I could not leave a voice message (phone on file is [phone]).  She has persistent elevation of her alkaline phosphatase with elevated IgM levels but negative AMA.  Given this elevation, it is possible that she is presenting seronegative PBC, however undergoing a liver biopsy given her frail status will not be an ideal option.  Due to this, I will prescribe ursodiol 300 mg 3 times daily.  Regarding her test, her vitamin D levels were normal, she also had elevated antibodies against her thyroid (I did not order these tests).  Finally, she is due to have her CT enterography performed.  [name], MD Gastroenterology and Hepatology [facility]

## 2020-11-06 DIAGNOSIS — I69351 Hemiplegia and hemiparesis following cerebral infarction affecting right dominant side: Secondary | ICD-10-CM | POA: Diagnosis not present

## 2020-11-06 NOTE — Telephone Encounter (Signed)
Tried calling no answer at the nursing facility.

## 2020-11-07 DIAGNOSIS — I69351 Hemiplegia and hemiparesis following cerebral infarction affecting right dominant side: Secondary | ICD-10-CM | POA: Diagnosis not present

## 2020-11-07 NOTE — Telephone Encounter (Signed)
Called nursing facility phone rang constantly until it disconnected.

## 2020-11-10 DIAGNOSIS — I69351 Hemiplegia and hemiparesis following cerebral infarction affecting right dominant side: Secondary | ICD-10-CM | POA: Diagnosis not present

## 2020-11-10 NOTE — Telephone Encounter (Signed)
I spoke with BJ Nurse at [facility] she is aware of all and took verbal for ursodiol. She was also made aware of the Ct due on 11/18/2020 for pt to be npo four hours prior to arrival time (12:30 pm) at [facility] radiology. She also asked that I fax this note to their atten at [phone].

## 2020-11-10 NOTE — Telephone Encounter (Signed)
I have tried numerous times today to reach the facility the phone rings and then disconnects.

## 2020-11-11 DIAGNOSIS — I69351 Hemiplegia and hemiparesis following cerebral infarction affecting right dominant side: Secondary | ICD-10-CM | POA: Diagnosis not present

## 2020-11-13 DIAGNOSIS — I69351 Hemiplegia and hemiparesis following cerebral infarction affecting right dominant side: Secondary | ICD-10-CM | POA: Diagnosis not present

## 2020-11-14 DIAGNOSIS — F419 Anxiety disorder, unspecified: Secondary | ICD-10-CM | POA: Diagnosis not present

## 2020-11-14 DIAGNOSIS — R0981 Nasal congestion: Secondary | ICD-10-CM | POA: Diagnosis not present

## 2020-11-14 DIAGNOSIS — I69351 Hemiplegia and hemiparesis following cerebral infarction affecting right dominant side: Secondary | ICD-10-CM | POA: Diagnosis not present

## 2020-11-14 DIAGNOSIS — K30 Functional dyspepsia: Secondary | ICD-10-CM | POA: Diagnosis not present

## 2020-11-15 DIAGNOSIS — I69351 Hemiplegia and hemiparesis following cerebral infarction affecting right dominant side: Secondary | ICD-10-CM | POA: Diagnosis not present

## 2020-11-17 DIAGNOSIS — I69351 Hemiplegia and hemiparesis following cerebral infarction affecting right dominant side: Secondary | ICD-10-CM | POA: Diagnosis not present

## 2020-11-18 ENCOUNTER — Other Ambulatory Visit: Payer: Self-pay

## 2020-11-18 ENCOUNTER — Ambulatory Visit (HOSPITAL_COMMUNITY)
Admission: RE | Admit: 2020-11-18 | Discharge: 2020-11-18 | Disposition: A | Discharge status: Home / Self Care | Payer: Medicare HMO | Source: Ambulatory Visit | Attending: Gastroenterology | Admitting: Gastroenterology

## 2020-11-18 DIAGNOSIS — I69351 Hemiplegia and hemiparesis following cerebral infarction affecting right dominant side: Secondary | ICD-10-CM | POA: Diagnosis not present

## 2020-11-18 DIAGNOSIS — K909 Intestinal malabsorption, unspecified: Secondary | ICD-10-CM

## 2020-11-18 DIAGNOSIS — K529 Noninfective gastroenteritis and colitis, unspecified: Secondary | ICD-10-CM

## 2020-11-18 DIAGNOSIS — K760 Fatty (change of) liver, not elsewhere classified: Secondary | ICD-10-CM | POA: Diagnosis not present

## 2020-11-18 DIAGNOSIS — A09 Infectious gastroenteritis and colitis, unspecified: Secondary | ICD-10-CM | POA: Diagnosis not present

## 2020-11-18 LAB — POCT I-STAT CREATININE: Creatinine, Ser: 0.7 mg/dL (ref 0.44–1.00)

## 2020-11-18 MED ORDER — IOHEXOL 300 MG/ML  SOLN
125.0000 mL | Freq: Once | INTRAMUSCULAR | Status: AC | PRN
  Administered 2020-11-18: 125 mL via INTRAVENOUS

## 2020-11-19 DIAGNOSIS — I69351 Hemiplegia and hemiparesis following cerebral infarction affecting right dominant side: Secondary | ICD-10-CM | POA: Diagnosis not present

## 2020-11-20 DIAGNOSIS — I69351 Hemiplegia and hemiparesis following cerebral infarction affecting right dominant side: Secondary | ICD-10-CM | POA: Diagnosis not present

## 2020-11-20 DIAGNOSIS — F32A Depression, unspecified: Secondary | ICD-10-CM | POA: Diagnosis not present

## 2020-11-20 DIAGNOSIS — G4701 Insomnia due to medical condition: Secondary | ICD-10-CM | POA: Diagnosis not present

## 2020-11-20 DIAGNOSIS — F419 Anxiety disorder, unspecified: Secondary | ICD-10-CM | POA: Diagnosis not present

## 2020-11-21 DIAGNOSIS — I69351 Hemiplegia and hemiparesis following cerebral infarction affecting right dominant side: Secondary | ICD-10-CM | POA: Diagnosis not present

## 2020-11-24 DIAGNOSIS — I69351 Hemiplegia and hemiparesis following cerebral infarction affecting right dominant side: Secondary | ICD-10-CM | POA: Diagnosis not present

## 2020-11-25 DIAGNOSIS — H6120 Impacted cerumen, unspecified ear: Secondary | ICD-10-CM | POA: Diagnosis not present

## 2020-11-25 DIAGNOSIS — H938X9 Other specified disorders of ear, unspecified ear: Secondary | ICD-10-CM | POA: Diagnosis not present

## 2020-11-27 DIAGNOSIS — F064 Anxiety disorder due to known physiological condition: Secondary | ICD-10-CM | POA: Diagnosis not present

## 2020-11-27 DIAGNOSIS — F331 Major depressive disorder, recurrent, moderate: Secondary | ICD-10-CM | POA: Diagnosis not present

## 2020-12-04 DIAGNOSIS — G4701 Insomnia due to medical condition: Secondary | ICD-10-CM | POA: Diagnosis not present

## 2020-12-04 DIAGNOSIS — F331 Major depressive disorder, recurrent, moderate: Secondary | ICD-10-CM | POA: Diagnosis not present

## 2020-12-04 DIAGNOSIS — F064 Anxiety disorder due to known physiological condition: Secondary | ICD-10-CM | POA: Diagnosis not present

## 2020-12-17 DIAGNOSIS — F064 Anxiety disorder due to known physiological condition: Secondary | ICD-10-CM | POA: Diagnosis not present

## 2020-12-17 DIAGNOSIS — G4701 Insomnia due to medical condition: Secondary | ICD-10-CM | POA: Diagnosis not present

## 2020-12-17 DIAGNOSIS — F331 Major depressive disorder, recurrent, moderate: Secondary | ICD-10-CM | POA: Diagnosis not present

## 2020-12-18 DIAGNOSIS — F064 Anxiety disorder due to known physiological condition: Secondary | ICD-10-CM | POA: Diagnosis not present

## 2020-12-18 DIAGNOSIS — F331 Major depressive disorder, recurrent, moderate: Secondary | ICD-10-CM | POA: Diagnosis not present

## 2020-12-22 DIAGNOSIS — Z03818 Encounter for observation for suspected exposure to other biological agents ruled out: Secondary | ICD-10-CM | POA: Diagnosis not present

## 2020-12-22 DIAGNOSIS — Z20828 Contact with and (suspected) exposure to other viral communicable diseases: Secondary | ICD-10-CM | POA: Diagnosis not present

## 2020-12-23 DIAGNOSIS — U071 COVID-19: Secondary | ICD-10-CM | POA: Diagnosis not present

## 2020-12-24 DIAGNOSIS — U071 COVID-19: Secondary | ICD-10-CM | POA: Diagnosis not present

## 2020-12-24 DIAGNOSIS — F419 Anxiety disorder, unspecified: Secondary | ICD-10-CM | POA: Diagnosis not present

## 2020-12-24 DIAGNOSIS — G4700 Insomnia, unspecified: Secondary | ICD-10-CM | POA: Diagnosis not present

## 2020-12-30 DIAGNOSIS — U071 COVID-19: Secondary | ICD-10-CM | POA: Diagnosis not present

## 2020-12-31 DIAGNOSIS — G4700 Insomnia, unspecified: Secondary | ICD-10-CM | POA: Diagnosis not present

## 2020-12-31 DIAGNOSIS — R0981 Nasal congestion: Secondary | ICD-10-CM | POA: Diagnosis not present

## 2021-01-02 DIAGNOSIS — E559 Vitamin D deficiency, unspecified: Secondary | ICD-10-CM | POA: Diagnosis not present

## 2021-01-07 DIAGNOSIS — U071 COVID-19: Secondary | ICD-10-CM | POA: Diagnosis not present

## 2021-01-08 DIAGNOSIS — F064 Anxiety disorder due to known physiological condition: Secondary | ICD-10-CM | POA: Diagnosis not present

## 2021-01-08 DIAGNOSIS — F331 Major depressive disorder, recurrent, moderate: Secondary | ICD-10-CM | POA: Diagnosis not present

## 2021-01-20 IMAGING — CT CT ENTEROGRAPHY (ABD-PELV W/ CM)
2 of 6 series · 11 of 46 positions shown, 12 images · IV contrast (omnipaque)
Comparison: August 29, 2020
COMPARISON: August 29, 2020

Addendum:
CLINICAL DATA: Infectious gastroenteritis

EXAM:
CT ABDOMEN AND PELVIS WITH CONTRAST (ENTEROGRAPHY)
TECHNIQUE: Multidetector CT of the abdomen and pelvis during bolus
administration of intravenous contrast. Negative oral contrast was
given.
CONTRAST:  125mL OMNIPAQUE IOHEXOL 300 MG/ML  SOLN

[Series 4: entero thins · axial · 0.83mm/px · z∈[+795,+1187]mm · 8 of 226 slices shown, 9 images]
[im 15/226  soft-tissue]
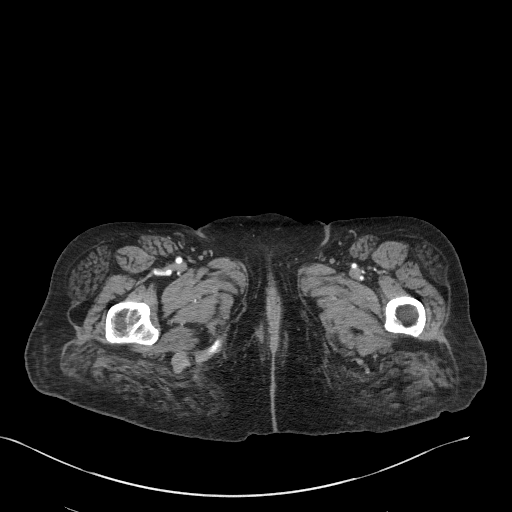
[im 15/226  bone]
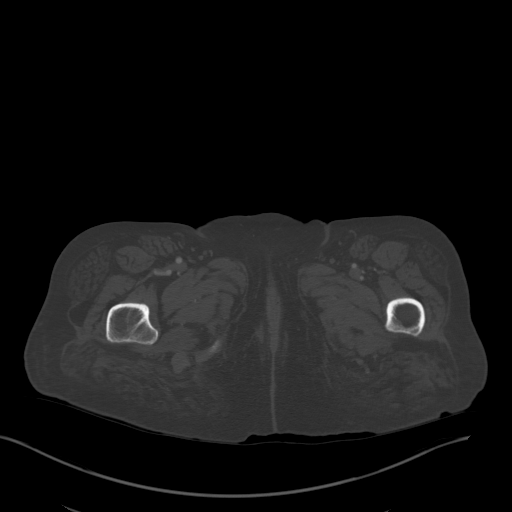
[im 43/226  soft-tissue]
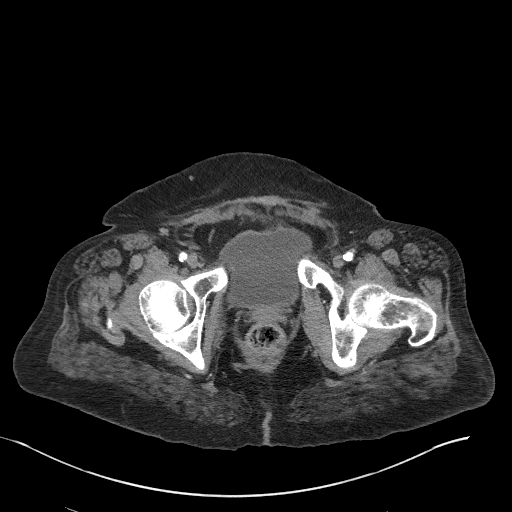
[im 71/226  soft-tissue]
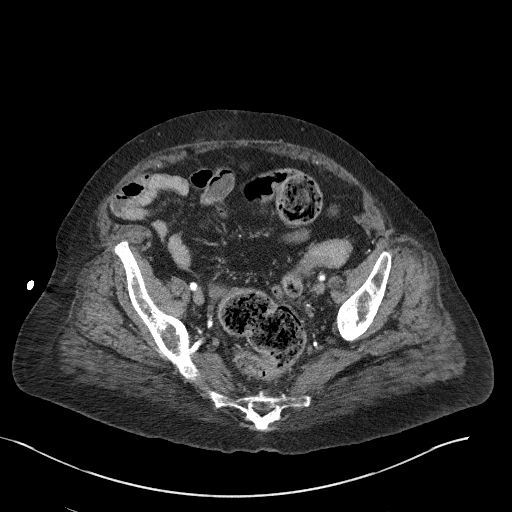
[im 99/226  soft-tissue]
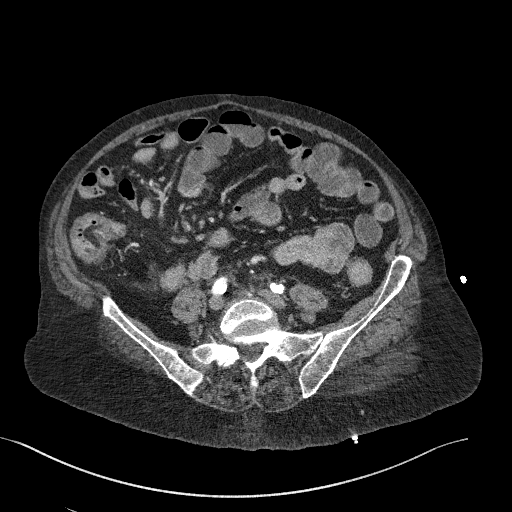
[im 127/226  soft-tissue]
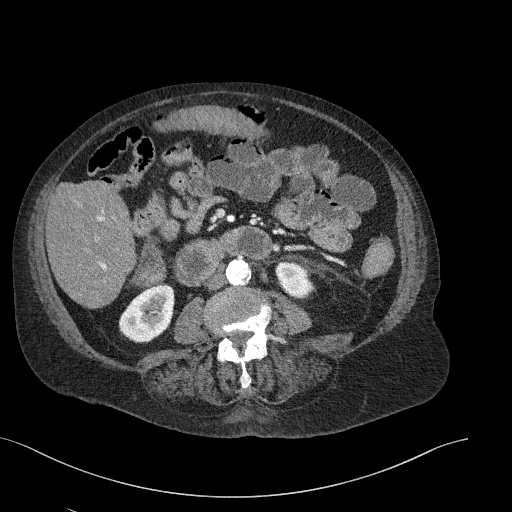
[im 155/226  soft-tissue]
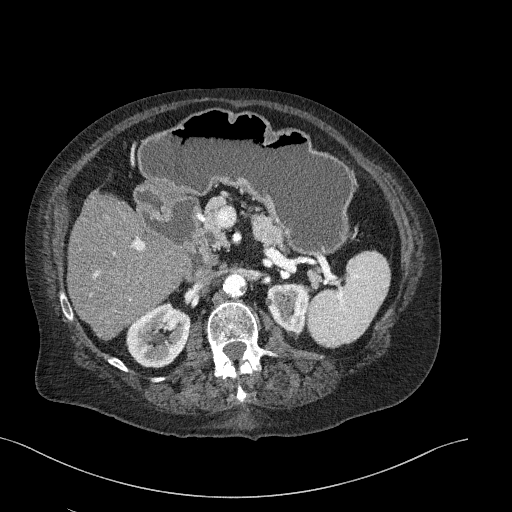
[im 183/226  soft-tissue]
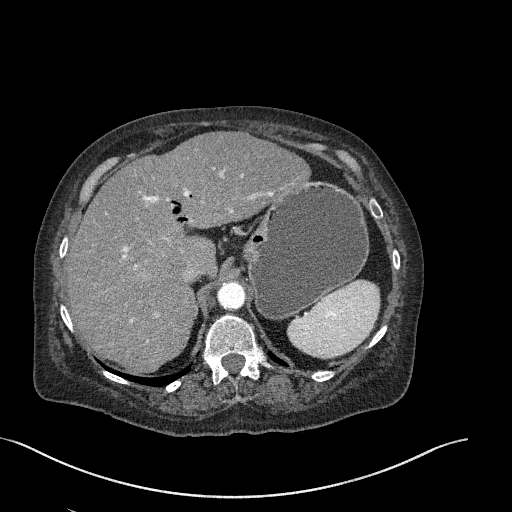
[im 211/226  soft-tissue]
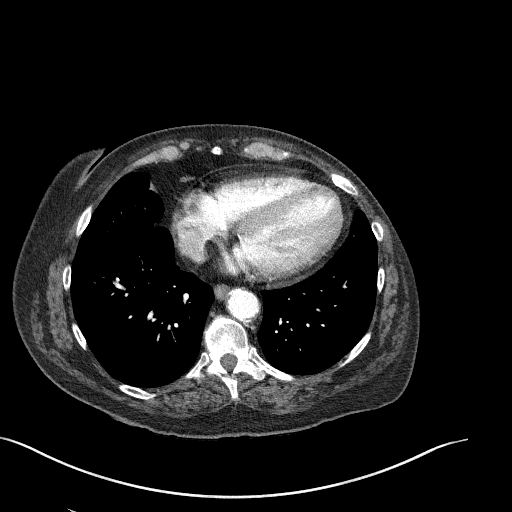

[Series 7: coronal · coronal · 0.70mm/px · 3 of 103 slices shown]
[im 35/103  soft-tissue]
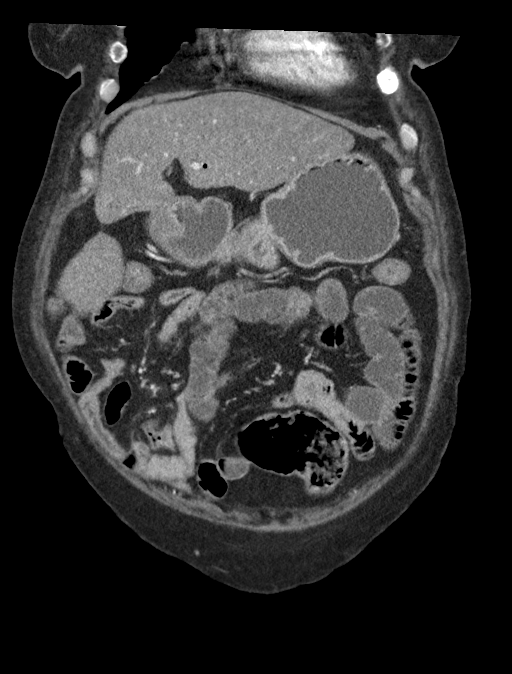
[im 46/103  soft-tissue]
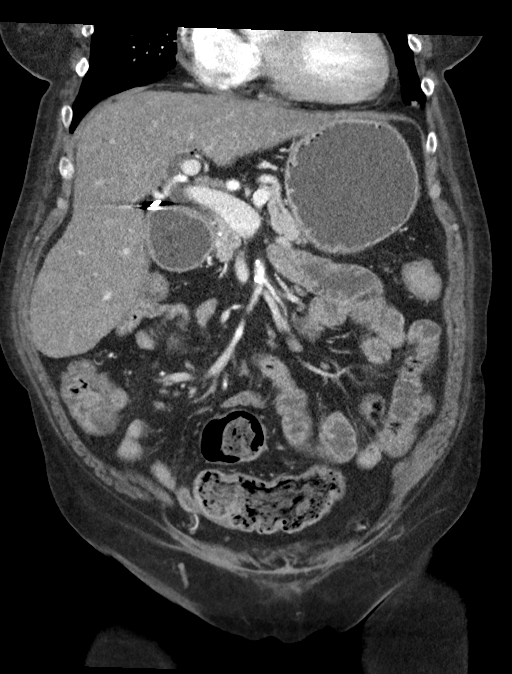
[im 57/103  soft-tissue]
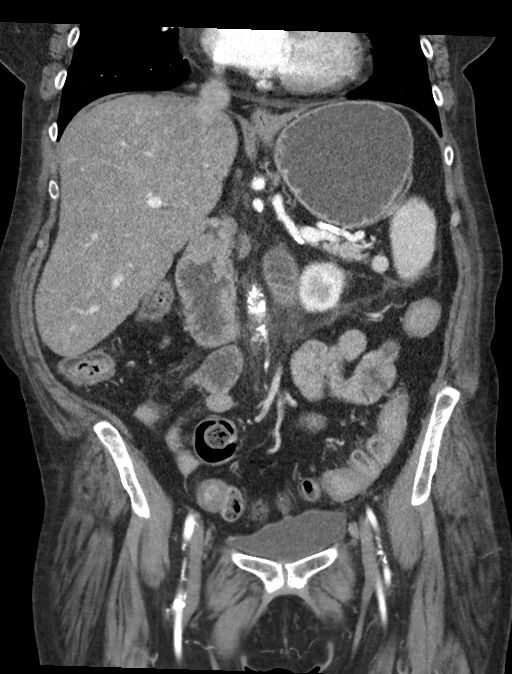

[11 of 46 positions shown; findings below may reference images not displayed]

FINDINGS: Lower chest: Minimal basilar atelectatic changes. No consolidation.
No pleural effusion.

Hepatobiliary: Lobular hepatic contours. Hepatic steatosis as
before. No focal, suspicious hepatic lesion. Similar degree of
hepatomegaly. Portal vein is patent. Hepatic veins are patent. Post
cholecystectomy. No biliary duct dilation.

Pancreas: Normal appearance of the pancreas, no ductal dilation or
inflammation.

Spleen: Normal

Adrenals/Urinary Tract: Adrenal glands are normal. Symmetric renal
enhancement. No hydronephrosis. Urinary bladder is unremarkable.

Stomach/Bowel: Stomach distended with ingested contrast material.
Adequate small bowel distension proximally, less distension
distally. Diminished perienteric inflammation.

Diminished pericolonic inflammation.

Persistent colonic thickening throughout the colon.

Persistent mural stratification of distal small bowel loops, ileum
in particular.

The appendix is normal.

Vascular/Lymphatic: Moderate to marked calcified and noncalcified
plaque in the abdominal aorta. No aneurysmal dilation. There is no
gastrohepatic or hepatoduodenal ligament lymphadenopathy. No
retroperitoneal or mesenteric lymphadenopathy.

No pelvic sidewall lymphadenopathy.

Reproductive: Post hysterectomy.  No adnexal mass.

Other: Trace fluid in the pelvis with diminished ascites in general,
near complete resolution of fluid in the abdomen and pelvis when
compared to the prior exam. No free air. No pneumatosis.

Musculoskeletal: Signs of lumbar compression fracture at the L1
level and at the L4 level. Interval increased loss of height at the
L1 level since the prior examination. Moderate increase in
retropulsion of fracture elements from posterior cortex into the
central canal approximately 40% canal narrowing and mild-to-moderate
kyphotic changes at this level, greater than 50% loss of height,
approaching 60-70% loss of height in the vertebral body. L4
compression fracture is similar to previous imaging.

Interval compression fracture with minimal retropulsion of posterior
cortical elements at L5 with approximately 30 % loss of height
compared to the prior study
IMPRESSION: 1. Persistent colonic thickening and mural stratification throughout
the colon with diminished pericolonic inflammation. Persistent mural
stratification of distal small bowel loops, in particular in the
ileum. Findings suggest improving colitis and enteritis.
2. Further compression of the L1 vertebral level now with kyphotic
angulation and and retropulsion of fracture fragments with increased
canal narrowing in the range of 40% narrowing, increased since the
prior exam. Neuro surgical consultation may be helpful.
3. Interval compression fracture with minimal retropulsion of
posterior cortical elements at L5 with approximately 30 % loss of
height compared to the prior study.
4. Hepatic steatosis with hepatomegaly as before.
5. Aortic atherosclerosis.

A call is out to the referring provider to further discuss findings
in the above case.

Aortic Atherosclerosis (UIMSS-75Y.Y).

ADDENDUM:
There is also pneumobilia on the current study which has been
present to varying degrees on previous imaging.

Findings of worsening of L1 compression fracture with retropulsion
and new L5 compression fracture were called by telephone at the time
of interpretation on 11/19/2020 at [DATE] to provider BLANK
MAMADOU SIEBER , who verbally acknowledged these results.

*** End of Addendum ***
FINDINGS: Lower chest: Minimal basilar atelectatic changes. No consolidation.
No pleural effusion.

Hepatobiliary: Lobular hepatic contours. Hepatic steatosis as
before. No focal, suspicious hepatic lesion. Similar degree of
hepatomegaly. Portal vein is patent. Hepatic veins are patent. Post
cholecystectomy. No biliary duct dilation.

Pancreas: Normal appearance of the pancreas, no ductal dilation or
inflammation.

Spleen: Normal

Adrenals/Urinary Tract: Adrenal glands are normal. Symmetric renal
enhancement. No hydronephrosis. Urinary bladder is unremarkable.

Stomach/Bowel: Stomach distended with ingested contrast material.
Adequate small bowel distension proximally, less distension
distally. Diminished perienteric inflammation.

Diminished pericolonic inflammation.

Persistent colonic thickening throughout the colon.

Persistent mural stratification of distal small bowel loops, ileum
in particular.

The appendix is normal.

Vascular/Lymphatic: Moderate to marked calcified and noncalcified
plaque in the abdominal aorta. No aneurysmal dilation. There is no
gastrohepatic or hepatoduodenal ligament lymphadenopathy. No
retroperitoneal or mesenteric lymphadenopathy.

No pelvic sidewall lymphadenopathy.

Reproductive: Post hysterectomy.  No adnexal mass.

Other: Trace fluid in the pelvis with diminished ascites in general,
near complete resolution of fluid in the abdomen and pelvis when
compared to the prior exam. No free air. No pneumatosis.

Musculoskeletal: Signs of lumbar compression fracture at the L1
level and at the L4 level. Interval increased loss of height at the
L1 level since the prior examination. Moderate increase in
retropulsion of fracture elements from posterior cortex into the
central canal approximately 40% canal narrowing and mild-to-moderate
kyphotic changes at this level, greater than 50% loss of height,
approaching 60-70% loss of height in the vertebral body. L4
compression fracture is similar to previous imaging.

Interval compression fracture with minimal retropulsion of posterior
cortical elements at L5 with approximately 30 % loss of height
compared to the prior study
IMPRESSION: 1. Persistent colonic thickening and mural stratification throughout
the colon with diminished pericolonic inflammation. Persistent mural
stratification of distal small bowel loops, in particular in the
ileum. Findings suggest improving colitis and enteritis.
2. Further compression of the L1 vertebral level now with kyphotic
angulation and and retropulsion of fracture fragments with increased
canal narrowing in the range of 40% narrowing, increased since the
prior exam. Neuro surgical consultation may be helpful.
3. Interval compression fracture with minimal retropulsion of
posterior cortical elements at L5 with approximately 30 % loss of
height compared to the prior study.
4. Hepatic steatosis with hepatomegaly as before.
5. Aortic atherosclerosis.

A call is out to the referring provider to further discuss findings
in the above case.

Aortic Atherosclerosis (UIMSS-75Y.Y).

## 2021-01-29 DIAGNOSIS — G4701 Insomnia due to medical condition: Secondary | ICD-10-CM | POA: Diagnosis not present

## 2021-01-29 DIAGNOSIS — F331 Major depressive disorder, recurrent, moderate: Secondary | ICD-10-CM | POA: Diagnosis not present

## 2021-01-29 DIAGNOSIS — F064 Anxiety disorder due to known physiological condition: Secondary | ICD-10-CM | POA: Diagnosis not present

## 2021-02-05 DIAGNOSIS — G9341 Metabolic encephalopathy: Secondary | ICD-10-CM | POA: Diagnosis not present

## 2021-02-05 DIAGNOSIS — M6281 Muscle weakness (generalized): Secondary | ICD-10-CM | POA: Diagnosis not present

## 2021-02-10 DIAGNOSIS — M6281 Muscle weakness (generalized): Secondary | ICD-10-CM | POA: Diagnosis not present

## 2021-02-10 DIAGNOSIS — G9341 Metabolic encephalopathy: Secondary | ICD-10-CM | POA: Diagnosis not present

## 2021-02-11 DIAGNOSIS — F064 Anxiety disorder due to known physiological condition: Secondary | ICD-10-CM | POA: Diagnosis not present

## 2021-02-11 DIAGNOSIS — G9341 Metabolic encephalopathy: Secondary | ICD-10-CM | POA: Diagnosis not present

## 2021-02-11 DIAGNOSIS — G4701 Insomnia due to medical condition: Secondary | ICD-10-CM | POA: Diagnosis not present

## 2021-02-11 DIAGNOSIS — F331 Major depressive disorder, recurrent, moderate: Secondary | ICD-10-CM | POA: Diagnosis not present

## 2021-02-11 DIAGNOSIS — M6281 Muscle weakness (generalized): Secondary | ICD-10-CM | POA: Diagnosis not present

## 2021-02-12 DIAGNOSIS — M6281 Muscle weakness (generalized): Secondary | ICD-10-CM | POA: Diagnosis not present

## 2021-02-12 DIAGNOSIS — F064 Anxiety disorder due to known physiological condition: Secondary | ICD-10-CM | POA: Diagnosis not present

## 2021-02-12 DIAGNOSIS — G9341 Metabolic encephalopathy: Secondary | ICD-10-CM | POA: Diagnosis not present

## 2021-02-12 DIAGNOSIS — F331 Major depressive disorder, recurrent, moderate: Secondary | ICD-10-CM | POA: Diagnosis not present

## 2021-02-13 DIAGNOSIS — G9341 Metabolic encephalopathy: Secondary | ICD-10-CM | POA: Diagnosis not present

## 2021-02-13 DIAGNOSIS — M6281 Muscle weakness (generalized): Secondary | ICD-10-CM | POA: Diagnosis not present

## 2021-02-16 DIAGNOSIS — M6281 Muscle weakness (generalized): Secondary | ICD-10-CM | POA: Diagnosis not present

## 2021-02-16 DIAGNOSIS — G9341 Metabolic encephalopathy: Secondary | ICD-10-CM | POA: Diagnosis not present

## 2021-02-25 LAB — VITAMIN B12: Vitamin B-12: 291

## 2021-02-25 LAB — TSH: TSH: 89.7 — AB (ref 0.41–5.90)

## 2021-02-25 LAB — BASIC METABOLIC PANEL
BUN: 24 — AB (ref 4–21)
Creatinine: 1 (ref 0.5–1.1)

## 2021-02-25 LAB — HEMOGLOBIN A1C: Hemoglobin A1C: 5.5

## 2021-03-03 ENCOUNTER — Other Ambulatory Visit: Payer: Self-pay | Admitting: Neurosurgery

## 2021-03-03 DIAGNOSIS — S32010A Wedge compression fracture of first lumbar vertebra, initial encounter for closed fracture: Secondary | ICD-10-CM

## 2021-04-02 ENCOUNTER — Ambulatory Visit
Admission: RE | Admit: 2021-04-02 | Discharge: 2021-04-02 | Disposition: A | Discharge status: Home / Self Care | Payer: Medicare Other | Source: Ambulatory Visit | Attending: Neurosurgery | Admitting: Neurosurgery

## 2021-04-02 DIAGNOSIS — S32010A Wedge compression fracture of first lumbar vertebra, initial encounter for closed fracture: Secondary | ICD-10-CM

## 2021-04-09 ENCOUNTER — Ambulatory Visit (INDEPENDENT_AMBULATORY_CARE_PROVIDER_SITE_OTHER): Payer: Medicare Other | Admitting: Gastroenterology

## 2021-04-09 ENCOUNTER — Encounter (INDEPENDENT_AMBULATORY_CARE_PROVIDER_SITE_OTHER): Payer: Self-pay | Admitting: Gastroenterology

## 2021-04-09 ENCOUNTER — Other Ambulatory Visit: Payer: Self-pay

## 2021-04-09 VITALS — BP 123/74 | HR 83 | Temp 97.9°F | Ht 62.0 in | Wt 207.0 lb

## 2021-04-09 DIAGNOSIS — K909 Intestinal malabsorption, unspecified: Secondary | ICD-10-CM | POA: Diagnosis not present

## 2021-04-09 DIAGNOSIS — K529 Noninfective gastroenteritis and colitis, unspecified: Secondary | ICD-10-CM

## 2021-04-09 LAB — VITAMIN D 25 HYDROXY (VIT D DEFICIENCY, FRACTURES): Vit D, 25-Hydroxy: 40 ng/mL (ref 30–100)

## 2021-04-09 NOTE — Progress Notes (Signed)
Deborah Russo, M.D. Gastroenterology & Hepatology [facility] [address] [place], [place] [address]  Primary Care Physician: Baruch Gouty, FNP No address on file  I will communicate my assessment and recommendations to the referring MD via EMR.  Problems: 1. Chronic diarrhea- resolved (possible Brainerd's diarrhea) 2. Intestinal Malabsorption 3. Liposoluble vitamin deficiency   History of Present Illness: Deborah Russo is a 60 y.o. female with history of chronic abdominal pain, hypothyroidism, hypertension, GERD, prediabetes, OSA, stroke, substance abuse, history of C. difficile colitis, chronic abdominal pain, who presents for follow up of chronic diarrhea.   The patient was last seen on 10/09/2020. At that time, the patient had vitamin D, esophagogastroduodenospy and they checked, also underwent a CT enterography for evaluation of abdominal pain and diarrhea.  She was advised to take Colestid and loperamide as needed for diarrhea.  CT enterography was performed on 11/19/2020 which showed presence of chronic thickening throughout the colon with improvement in inflammation, also presence of stratification of the distal small bowel loops consistent with colitis and enteritis.  There was also presence of pneumobilia which was present in previous imaging, presence of compression fracture at the level of L5 and worsening compression fracture at L1.  Patient reports she is not taking the antidiarrheals any more as she is having 1-2 BMs which are well formed. Denies having any complaints and states feeling well. The patient denies having any nausea, vomiting, fever, chills, hematochezia, melena, hematemesis, abdominal distention, abdominal pain, diarrhea, jaundice, pruritus or weight loss.  In fact, the patient has gained 50 pounds since he was seen in the clinic.  Currently she is seeing a physician for her back pain, but states  that the pain has not been controlled.  Last Colonoscopy: 09/03/20 - Presence of extensive perianal erythema and excoriated skin breaks in found on perianal exam. - Atrophic mucosa with flattened villi in the terminal ileum. Biopsied. - Six non bleeding AVMs in cecum. - Congested mucosa in the entire examined colon. Biopsied. - Possible hyperkeratotic mucosa at the anorectal junction. Biopsied.  Biopsy results . TERMINAL ILEUM, BIOPSY:  - Ileal mucosa with hyperemia.  - No active inflammation, chronic changes or granulomas.   B. COLON, RANDOM, BIOPSY:  - Unremarkable colonic mucosa with hyperemia.  - No microscopic colitis, active inflammation or chronic changes.   C. ANUS, BIOPSY:  - Inflamed squamous mucosa with reactive squamous atypia.   Congo red stain is performed on all three biopsies and no amyloid  deposits are identified. PAS stain is performed on the anus biopsy  (part C) and no fungi are identified.   Past Medical History: Past Medical History:  Diagnosis Date  . Chronic abdominal pain    since approximately 1991  . Chronic migraine   . Chronic pain   . Hypothyroidism   . Nausea, vomiting, and diarrhea    recurrent, chronic  . Pain management   . Sleep apnea    has CPAP, but doesn't wear it bc she says "i can't sleep with it on"  . Stroke [facility])     Past Surgical History: Past Surgical History:  Procedure Laterality Date  . ABDOMINAL HYSTERECTOMY    . ABDOMINAL SURGERY    . BIOPSY N/A 07/17/2014   Procedure: BIOPSY;  Surgeon: Rogene Houston, MD;  Location: [facility];  Service: Endoscopy;  Laterality: N/A;  . BIOPSY  06/16/2018   Procedure: BIOPSY;  Surgeon: [name], MD;  Location: [facility] ENDOSCOPY;  Service: Endoscopy;;  . BIOPSY  09/03/2020   Procedure: BIOPSY;  Surgeon: Harvel Quale, MD;  Location: [facility];  Service: Gastroenterology;;  terminal ileum, random,anal  . BREAST SURGERY Left    removed nipple  . CESAREAN SECTION    .  CHOLECYSTECTOMY    . COLONOSCOPY WITH PROPOFOL  09/03/2020   Procedure: COLONOSCOPY WITH PROPOFOL;  Surgeon: Harvel Quale, MD;  Location: [facility];  Service: Gastroenterology;;  . ESOPHAGOGASTRODUODENOSCOPY N/A 07/17/2014   Procedure: ESOPHAGOGASTRODUODENOSCOPY (EGD);  Surgeon: Rogene Houston, MD;  Location: [facility]S;  Service: Endoscopy;  Laterality: N/A;  . FLEXIBLE SIGMOIDOSCOPY N/A 06/16/2018   Procedure: FLEXIBLE SIGMOIDOSCOPY;  Surgeon: [name], MD;  Location: [facility] ENDOSCOPY;  Service: Endoscopy;  Laterality: N/A;    Family History: Family History  Problem Relation Age of Onset  . Asthma Mother   . Diabetes Mother   . Cancer Father     Social History: Social History   Tobacco Use  Smoking Status Current Every Day Smoker  . Packs/day: 0.50  . Years: 41.00  . Pack years: 20.50  . Types: Cigarettes  Smokeless Tobacco Never Used   Social History   Substance and Sexual Activity  Alcohol Use No   Social History   Substance and Sexual Activity  Drug Use Yes  . Frequency: 2.0 times per week  . Types: Oxycodone    Allergies: Allergies  Allergen Reactions  . Penicillins Hives    *Tolerates zosyn Has patient had a PCN reaction causing immediate rash, facial/tongue/throat swelling, SOB or lightheadedness with hypotension: No Has patient had a PCN reaction causing severe rash involving mucus membranes or skin necrosis: No Has patient had a PCN reaction that required hospitalization No Has patient had a PCN reaction occurring within the last 10 years: No If all of the above answers are "NO", then may proceed with Cephalosporin use.   . Sulfa Antibiotics Hives    Medications: Current Outpatient Medications  Medication Sig Dispense Refill  . aspirin 325 MG tablet Take 1 tablet (325 mg total) by mouth daily. 100 tablet 0  . atorvastatin (LIPITOR) 10 MG tablet Take 10 mg by mouth daily.    . Beta Carotene (VITAMIN A) 25000 UNIT capsule Take 25,000  Units by mouth daily.    . busPIRone (BUSPAR) 10 MG tablet Take 20 mg by mouth 2 (two) times daily.    . cetirizine (ZYRTEC) 10 MG tablet Take 10 mg by mouth daily.    . Cholecalciferol (VITAMIN D3) 75 MCG (3000 UT) TABS Take 1 tablet by mouth daily.    . Emollient (EUCERIN PLUS) 5-5 % LOTN Apply topically.    . fluticasone (FLONASE) 50 MCG/ACT nasal spray Place 1 spray into both nostrils daily. prn    . folic acid (FOLVITE) 1 MG tablet Take 1 tablet (1 mg total) by mouth daily. 30 tablet 0  . furosemide (LASIX) 20 MG tablet Take 20 mg by mouth daily.    . hydrOXYzine (ATARAX/VISTARIL) 25 MG tablet Take 25 mg by mouth at bedtime.    Marland Kitchen levothyroxine (SYNTHROID) 125 MCG tablet Take 1 tablet (125 mcg total) by mouth daily before breakfast. (Patient taking differently: Take 150 mcg by mouth daily before breakfast.) 90 tablet 1  . magnesium oxide (MAG-OX) 400 MG tablet Take 400 mg by mouth daily.    . Menthol-Zinc Oxide (CALMOSEPTINE) 0.44-20.6 % OINT Apply topically as needed.    Marland Kitchen oxyCODONE (OXYCONTIN) 20 mg 12 hr tablet Take 1 tablet (  20 mg total) by mouth every 12 (twelve) hours. 14 tablet 0  . pantoprazole (PROTONIX) 40 MG tablet Take 1 tablet (40 mg total) by mouth daily. (Patient taking differently: Take 20 mg by mouth daily.) 30 tablet 0  . pregabalin (LYRICA) 100 MG capsule Take 100 mg by mouth 2 (two) times daily.    . sertraline (ZOLOFT) 25 MG tablet Take 75 mg by mouth daily.    Marland Kitchen thiamine 100 MG tablet Take 1 tablet (100 mg total) by mouth daily. 30 tablet 0  . traZODone (DESYREL) 100 MG tablet Take 125 mg by mouth at bedtime.    . ursodiol (ACTIGALL) 250 MG tablet Take 1 tablet (250 mg total) by mouth 3 (three) times daily. 90 tablet 5  . vitamin B-12 1000 MCG tablet Take 1 tablet (1,000 mcg total) by mouth daily. 30 tablet 0  . Ascorbic Acid (VITAMIN C WITH ROSE HIPS) 500 MG tablet Take 500 mg by mouth 2 (two) times daily.    . colestipol (COLESTID) 1 g tablet Take 1 tablet (1 g total)  by mouth 2 (two) times daily. 60 tablet 0  . oxyCODONE-acetaminophen (PERCOCET/ROXICET) 5-325 MG tablet Take 1 tablet by mouth every 8 (eight) hours as needed for severe pain. (Patient not taking: Reported on 04/09/2021) 30 tablet 0   No current facility-administered medications for this visit.    Review of Systems: GENERAL: negative for malaise, night sweats HEENT: No changes in hearing or vision, no nose bleeds or other nasal problems. NECK: Negative for lumps, goiter, pain and significant neck swelling RESPIRATORY: Negative for cough, wheezing CARDIOVASCULAR: Negative for chest pain, leg swelling, palpitations, orthopnea GI: SEE HPI MUSCULOSKELETAL: Negative for joint pain or swelling, back pain, and muscle pain. SKIN: Negative for lesions, rash PSYCH: Negative for sleep disturbance, mood disorder and recent psychosocial stressors. HEMATOLOGY Negative for prolonged bleeding, bruising easily, and swollen nodes. ENDOCRINE: Negative for cold or heat intolerance, polyuria, polydipsia and goiter. NEURO: negative for tremor, gait imbalance, syncope and seizures. The remainder of the review of systems is noncontributory.   Physical Exam: BP 123/74 (BP Location: Left Arm, Patient Position: Sitting, Cuff Size: Large)   Pulse 83   Temp 97.9 F (36.6 C) (Oral)   Ht 5' 2"  (1.575 m)   Wt 207 lb (93.9 kg)   BMI 37.86 kg/m  GENERAL: The patient is AO x3, in no acute distress. Obese. HEENT: Head is normocephalic and atraumatic. EOMI are intact. Mouth is well hydrated and without lesions. NECK: Supple. No masses LUNGS: Clear to auscultation. No presence of rhonchi/wheezing/rales. Adequate chest expansion HEART: RRR, normal s1 and s2. ABDOMEN: Soft, nontender, no guarding, no peritoneal signs, and nondistended. BS +. No masses. EXTREMITIES: Without any cyanosis, clubbing, rash, lesions or edema. NEUROLOGIC: AOx3, no focal motor deficit. SKIN: no jaundice, no rashes  Imaging/Labs: as  above  I personally reviewed and interpreted the available labs, imaging and endoscopic files.  Impression and Plan: LOVIE ZARLING is a 60 y.o. female with history of chronic abdominal pain, hypothyroidism, hypertension, GERD, prediabetes, OSA, stroke, substance abuse, history of C. difficile colitis, chronic abdominal pain, who presents for follow up of chronic diarrhea.  The patient had an episode of severe diarrhea with evidence of significant malabsorption based on blood testing in the past.  However, this episode responded to the use of antidiarrheals andColestid.  In fact she is no longer taking any loperamide at the moment.  The etiology of the diarrhea is unclear but could be an  episode of Brainerd's diarrhea.  CT enterography showed presence of improving inflammation which I consider at this time has resolved completely.  I will check repeat blood testing to assess her current electrolyte status, but also will check for vitamin deficiency.  Fortunately, the patient has presented improvement in her bowel movement frequency and actually has gained weight since then.  I advised her to take again loperamide if she has recurrent diarrhea but will not add any other medication at this time.  - Check CBC,CMP, Zn, Mg, liposoluble vitamins and INR - Can restart Imodium and Colestid if you have recurrent episodes of diarrhea - Repeat colonoscopy in 2031  All questions were answered.      Harvel Quale, MD Gastroenterology and Hepatology [facility]

## 2021-04-09 NOTE — Patient Instructions (Signed)
Perform blood workup Can restart Imodium and Colestid if you have recurrent episodes of diarrhea Repeat colonoscopy in 2031

## 2021-04-12 LAB — CBC WITH DIFFERENTIAL/PLATELET
Absolute Monocytes: 523 cells/uL (ref 200–950)
Basophils Absolute: 78 cells/uL (ref 0–200)
Basophils Relative: 1 %
Eosinophils Absolute: 234 cells/uL (ref 15–500)
Eosinophils Relative: 3 %
HCT: 37.9 % (ref 35.0–45.0)
Hemoglobin: 12.6 g/dL (ref 11.7–15.5)
Lymphs Abs: 2356 cells/uL (ref 850–3900)
MCH: 30.4 pg (ref 27.0–33.0)
MCHC: 33.2 g/dL (ref 32.0–36.0)
MCV: 91.3 fL (ref 80.0–100.0)
MPV: 9.4 fL (ref 7.5–12.5)
Monocytes Relative: 6.7 %
Neutro Abs: 4610 cells/uL (ref 1500–7800)
Neutrophils Relative %: 59.1 %
Platelets: 184 10*3/uL (ref 140–400)
RBC: 4.15 10*6/uL (ref 3.80–5.10)
RDW: 11.8 % (ref 11.0–15.0)
Total Lymphocyte: 30.2 %
WBC: 7.8 10*3/uL (ref 3.8–10.8)

## 2021-04-12 LAB — COMPREHENSIVE METABOLIC PANEL
AG Ratio: 1.2 (calc) (ref 1.0–2.5)
ALT: 13 U/L (ref 6–29)
AST: 13 U/L (ref 10–35)
Albumin: 4.1 g/dL (ref 3.6–5.1)
Alkaline phosphatase (APISO): 138 U/L (ref 37–153)
BUN/Creatinine Ratio: 25 (calc) — ABNORMAL HIGH (ref 6–22)
BUN: 30 mg/dL — ABNORMAL HIGH (ref 7–25)
CO2: 35 mmol/L — ABNORMAL HIGH (ref 20–32)
Calcium: 9.5 mg/dL (ref 8.6–10.4)
Chloride: 96 mmol/L — ABNORMAL LOW (ref 98–110)
Creat: 1.21 mg/dL — ABNORMAL HIGH (ref 0.50–0.99)
Globulin: 3.3 g/dL (calc) (ref 1.9–3.7)
Glucose, Bld: 128 mg/dL — ABNORMAL HIGH (ref 65–99)
Potassium: 4.5 mmol/L (ref 3.5–5.3)
Sodium: 138 mmol/L (ref 135–146)
Total Bilirubin: 0.4 mg/dL (ref 0.2–1.2)
Total Protein: 7.4 g/dL (ref 6.1–8.1)

## 2021-04-12 LAB — ZINC: Zinc: 44 ug/dL — ABNORMAL LOW (ref 60–130)

## 2021-04-12 LAB — VITAMIN A: Vitamin A (Retinoic Acid): 56 ug/dL (ref 38–98)

## 2021-04-12 LAB — VITAMIN E
Gamma-Tocopherol (Vit E): 2.2 mg/L (ref <=4.3)
Vitamin E (Alpha Tocopherol): 13.5 mg/L (ref 5.7–19.9)

## 2021-04-12 LAB — PROTIME-INR
INR: 1
Prothrombin Time: 10 s (ref 9.0–11.5)

## 2021-04-12 LAB — PHOSPHORUS: Phosphorus: 4.5 mg/dL (ref 2.5–4.5)

## 2021-04-12 LAB — MAGNESIUM: Magnesium: 1.6 mg/dL (ref 1.5–2.5)

## 2021-04-13 ENCOUNTER — Other Ambulatory Visit (INDEPENDENT_AMBULATORY_CARE_PROVIDER_SITE_OTHER): Payer: Self-pay | Admitting: Gastroenterology

## 2021-04-13 DIAGNOSIS — E60 Dietary zinc deficiency: Secondary | ICD-10-CM

## 2021-04-13 MED ORDER — ZINC SULFATE 220 (50 ZN) MG PO TABS: 1.0000 | ORAL_TABLET | Freq: Every day | ORAL | 1 refills | Status: DC

## 2021-04-14 ENCOUNTER — Telehealth: Payer: Self-pay | Admitting: "Endocrinology

## 2021-04-14 NOTE — Telephone Encounter (Signed)
Received lab results, sch appt for may 2nd with Belinda, informed her a staff member had to be with her and medication list needed to be provided.

## 2021-04-14 NOTE — Telephone Encounter (Signed)
Deborah Russo called from [facility] in regards to sch this pt an appointment for high TSH, we have seen this patient for DM in the past but she has not made her follow up appts due to facility said she has been refusing. Informed them we would reach out to schedule once we receive the lab results from this since we will need those for this appointment as we have not treated her thyroid before.

## 2021-04-20 ENCOUNTER — Ambulatory Visit: Payer: Medicare HMO | Admitting: "Endocrinology

## 2021-07-13 ENCOUNTER — Ambulatory Visit (INDEPENDENT_AMBULATORY_CARE_PROVIDER_SITE_OTHER): Payer: Medicare Other | Admitting: Cardiology

## 2021-07-13 ENCOUNTER — Telehealth: Payer: Self-pay | Admitting: Cardiology

## 2021-07-13 ENCOUNTER — Encounter: Payer: Self-pay | Admitting: Cardiology

## 2021-07-13 ENCOUNTER — Other Ambulatory Visit: Payer: Self-pay

## 2021-07-13 VITALS — BP 100/68 | HR 79 | Ht 62.0 in | Wt 209.0 lb

## 2021-07-13 DIAGNOSIS — Z8673 Personal history of transient ischemic attack (TIA), and cerebral infarction without residual deficits: Secondary | ICD-10-CM | POA: Diagnosis not present

## 2021-07-13 DIAGNOSIS — I890 Lymphedema, not elsewhere classified: Secondary | ICD-10-CM

## 2021-07-13 DIAGNOSIS — R0602 Shortness of breath: Secondary | ICD-10-CM | POA: Diagnosis not present

## 2021-07-13 DIAGNOSIS — I10 Essential (primary) hypertension: Secondary | ICD-10-CM

## 2021-07-13 DIAGNOSIS — I110 Hypertensive heart disease with heart failure: Secondary | ICD-10-CM

## 2021-07-13 MED ORDER — METOPROLOL TARTRATE 25 MG PO TABS: 12.5000 mg | ORAL_TABLET | Freq: Two times a day (BID) | ORAL | 6 refills | Status: AC

## 2021-07-13 NOTE — Patient Instructions (Signed)
Medication Instructions:  Your physician has recommended you make the following change in your medication:  Increase metoprolol tartrate to 12.5 mg by mouth twice daily Continue other medications the same  Labwork: none  Testing/Procedures: Your physician has requested that you have an echocardiogram. Echocardiography is a painless test that uses sound waves to create images of your heart. It provides your doctor with information about the size and shape of your heart and how well your heart's chambers and valves are working. This procedure takes approximately one hour. There are no restrictions for this procedure.  Follow-Up: Your physician recommends that you schedule a follow-up appointment in: pending.  Any Other Special Instructions Will Be Listed Below (If Applicable).  If you need a refill on your cardiac medications before your next appointment, please call your pharmacy.

## 2021-07-13 NOTE — Progress Notes (Signed)
Cardiology Office Note  Date: 07/13/2021   ID: Deborah Russo, DOB 02/03/1961, MRN [id number]  PCP:  Baruch Gouty, FNP  Cardiologist:  Rozann Lesches, MD Electrophysiologist:  None   Chief Complaint  Patient presents with   Reported history of heart failure     History of Present Illness: Deborah Russo is a 60 y.o. female referred by Dr. Ninfa Meeker from [place] [facility] rehabilitation center with reported history of congestive heart failure, details not clear based on provided information.  She is here with an Environmental consultant.  I reviewed her history which is noted below.  She is not aware of any specific cardiac problems.  I note that she has compression wraps on her legs for treatment of lymphedema, also on diuretic therapy and low-dose beta-blocker.  Her weight is relatively stable in comparison to an earlier visit in April of this year per chart review.  She has a history of previous stroke with right-sided residual, is able to ambulate with a walker.  States that she has had some sinus congestion of the last 3 months which has affected her breathing, not using inhalers.  No chest pain with exertion, no palpitations.  I personally reviewed her ECG today which shows sinus rhythm with left atrial enlargement and increased voltage with repolarization changes.  Echocardiogram from September 2021 revealed vigorous LVEF at 70 to 75% with moderate to severe LVH but no LVOT gradient, normal RV contraction, and trivial mitral regurgitation.  Past Medical History:  Diagnosis Date   Anxiety    Atherosclerosis of aorta (HCC)    Chronic abdominal pain    Chronic migraine    Chronic pain    Cirrhosis (HCC)    COPD (chronic obstructive pulmonary disease) (HCC)    Depression    Essential hypertension    Hypothyroidism    Lumbar compression fracture (HCC)    Mixed hyperlipidemia    Nausea, vomiting, and diarrhea    Secondary hyperaldosteronism ([facility])    Sleep apnea    Stroke  [facility])     Past Surgical History:  Procedure Laterality Date   ABDOMINAL HYSTERECTOMY     ABDOMINAL SURGERY     BIOPSY N/A 07/17/2014   Procedure: BIOPSY;  Surgeon: Rogene Houston, MD;  Location: [facility];  Service: Endoscopy;  Laterality: N/A;   BIOPSY  06/16/2018   Procedure: BIOPSY;  Surgeon: [name], MD;  Location: [facility] ENDOSCOPY;  Service: Endoscopy;;   BIOPSY  09/03/2020   Procedure: BIOPSY;  Surgeon: Montez Morita, Quillian Quince, MD;  Location[facility];  Service: Gastroenterology;;  terminal ileum, random,anal   BREAST SURGERY Left    removed nipple   CESAREAN SECTION     CHOLECYSTECTOMY     COLONOSCOPY WITH PROPOFOL  09/03/2020   Procedure: COLONOSCOPY WITH PROPOFOL;  Surgeon: Harvel Quale, MD;  [facility];  Service: Gastroenterology;;   ESOPHAGOGASTRODUODENOSCOPY N/A 07/17/2014   Procedure: ESOPHAGOGASTRODUODENOSCOPY (EGD);  Surgeon: Rogene Houston, MD;  Location: [facility];  Service: Endoscopy;  Laterality: N/A;   FLEXIBLE SIGMOIDOSCOPY N/A 06/16/2018   Procedure: FLEXIBLE SIGMOIDOSCOPY;  Surgeon: [name], MD;  Location[facility] ENDOSCOPY;  Service: Endoscopy;  Laterality: N/A;    Current Outpatient Medications  Medication Sig Dispense Refill   acetaminophen (TYLENOL) 650 MG CR tablet Take 650 mg by mouth every 6 (six) hours as needed for pain.     albuterol (VENTOLIN HFA) 108 (90 Base) MCG/ACT inhaler Inhale 2 puffs into the lungs every  6 (six) hours as needed for wheezing or shortness of breath.     aspirin 325 MG tablet Take 1 tablet (325 mg total) by mouth daily. 100 tablet 0   atorvastatin (LIPITOR) 20 MG tablet Take 20 mg by mouth daily.     Beta Carotene (VITAMIN A) 25000 UNIT capsule Take 25,000 Units by mouth daily.     busPIRone (BUSPAR) 15 MG tablet Take 15 mg by mouth 2 (two) times daily.     Cholecalciferol (VITAMIN D3) 75 MCG (3000 UT) TABS Take 1 tablet by mouth daily.     Emollient (EUCERIN PLUS) 5-5 % LOTN Apply topically.      fluticasone (FLONASE) 50 MCG/ACT nasal spray Place 1 spray into both nostrils daily. prn     folic acid (FOLVITE) 1 MG tablet Take 1 tablet (1 mg total) by mouth daily. 30 tablet 0   furosemide (LASIX) 20 MG tablet Take 20 mg by mouth daily.     INCRUSE ELLIPTA 62.5 MCG/INH AEPB Inhale 1 puff into the lungs daily.     levothyroxine (SYNTHROID) 200 MCG tablet Take 200 mcg by mouth daily.     loratadine (CLARITIN) 10 MG tablet Take 10 mg by mouth daily.     magnesium oxide (MAG-OX) 400 MG tablet Take 400 mg by mouth daily.     nitroGLYCERIN (NITROSTAT) 0.4 MG SL tablet Place 0.4 mg under the tongue every 5 (five) minutes as needed for chest pain.     pantoprazole (PROTONIX) 20 MG tablet Take 20 mg by mouth 2 (two) times daily.     potassium chloride SA (KLOR-CON) 20 MEQ tablet Take 20 mEq by mouth daily.     pregabalin (LYRICA) 100 MG capsule Take 100 mg by mouth 2 (two) times daily.     senna (SENOKOT) 8.6 MG tablet Take 2 tablets by mouth daily.     sertraline (ZOLOFT) 50 MG tablet Take 150 mg by mouth daily.     thiamine 100 MG tablet Take 1 tablet (100 mg total) by mouth daily. 30 tablet 0   traZODone (DESYREL) 100 MG tablet Take 100 mg by mouth at bedtime.     traZODone (DESYREL) 50 MG tablet Take 25 mg by mouth at bedtime as needed.     ursodiol (ACTIGALL) 250 MG tablet Take 1 tablet (250 mg total) by mouth 3 (three) times daily. 90 tablet 5   vitamin B-12 1000 MCG tablet Take 1 tablet (1,000 mcg total) by mouth daily. 30 tablet 0   XTAMPZA ER 36 MG C12A Take 1 capsule by mouth 2 (two) times daily.     metoprolol tartrate (LOPRESSOR) 25 MG tablet Take 0.5 tablets (12.5 mg total) by mouth 2 (two) times daily. 30 tablet 6   No current facility-administered medications for this visit.   Allergies:  Penicillins and Sulfa antibiotics   Social History: The patient  reports that she has been smoking cigarettes. She has a 20.50 pack-year smoking history. She has never used smokeless tobacco. She  reports current drug use. Frequency: 2.00 times per week. Drug: Oxycodone. She reports that she does not drink alcohol.   Family History: The patient's family history includes Asthma in her mother; Cancer in her father; Diabetes in her mother.   ROS: No syncope.  Physical Exam: VS:  BP 100/68   Pulse 79   Ht 5' 2"  (1.575 m)   Wt 209 lb (94.8 kg)   SpO2 96%   BMI 38.23 kg/m , BMI Body mass index is  38.23 kg/m.  Wt Readings from Last 3 Encounters:  07/13/21 209 lb (94.8 kg)  04/09/21 207 lb (93.9 kg)  10/09/20 157 lb 8 oz (71.4 kg)    General: Patient appears comfortable at rest.  Uses a rolling walker. HEENT: Conjunctiva and lids normal, . Neck: Supple, no elevated JVP or carotid bruits, no thyromegaly. Lungs: Few rhonchi without wheezing, nonlabored breathing at rest. Cardiac: Regular rate and rhythm, no S3, 1/6 systolic murmur, no pericardial rub. Abdomen: Soft, nontender, bowel sounds present. Extremities: Lower legs wrapped to dorsum of feet bilaterally. Skin: Warm and dry. Musculoskeletal: No kyphosis. Neuropsychiatric: Alert and oriented x3, affect grossly appropriate.  ECG:  An ECG dated 08/28/2020 was personally reviewed today and demonstrated:  Sinus rhythm with left atrial enlargement, increased voltage with repolarization abnormalities.  Recent Labwork: 02/25/2021: TSH 89.70 04/09/2021: ALT 13; AST 13; BUN 30; Creat 1.21; Hemoglobin 12.6; Magnesium 1.6; Platelets 184; Potassium 4.5; Sodium 138     Component Value Date/Time   CHOL 389 (H) 02/06/2019 1632   TRIG 283 (H) 02/06/2019 1632   HDL 63 02/06/2019 1632   CHOLHDL 6.2 (H) 02/06/2019 1632   CHOLHDL 4.3 03/30/2017 0431   VLDL 39 03/30/2017 0431   LDLCALC 269 (H) 02/06/2019 1632    Other Studies Reviewed Today:  Echocardiogram 08/29/2020:  1. Left ventricular ejection fraction, by estimation, is 70 to 75%. The  left ventricle has hyperdynamic function. The left ventricle has no  regional wall motion  abnormalities. There is moderate to severe left  ventricular hypertrophy. No significant LVOT   gradient. Left ventricular diastolic parameters are indeterminate.   2. Right ventricular systolic function is normal. The right ventricular  size is normal. Tricuspid regurgitation signal is inadequate for assessing  PA pressure.   3. The mitral valve is grossly normal. Trivial mitral valve  regurgitation.   4. The aortic valve is tricuspid. Aortic valve regurgitation is not  visualized.   5. The inferior vena cava is normal in size with greater than 50%  respiratory variability, suggesting right atrial pressure of 3 mmHg.   6. Prominent pericardial fat pad.   Assessment and Plan:  1.  Lymphedema, currently with bilateral leg wraps in place and on Lasix.  2.  Reported history of congestive heart failure, details not clear.  Most likely does have diastolic dysfunction with moderate to severe LVH by her last echocardiogram in 2021 at which point LVEF was vigorous at 70 to 75%, no LVOT gradient noted, and RV contraction normal.  We will obtain a follow-up study for reassessment.  Blood pressure and heart rate are reasonable today.  Continue present diuretic course along with Lopressor.  3.  History of stroke.  She is on aspirin and Lipitor.  Medication Adjustments/Labs and Tests Ordered: Current medicines are reviewed at length with the patient today.  Concerns regarding medicines are outlined above.   Tests Ordered: Orders Placed This Encounter  Procedures   EKG 12-Lead   ECHOCARDIOGRAM COMPLETE     Medication Changes: Meds ordered this encounter  Medications   metoprolol tartrate (LOPRESSOR) 25 MG tablet    Sig: Take 0.5 tablets (12.5 mg total) by mouth 2 (two) times daily.    Dispense:  30 tablet    Refill:  6     Disposition:  Follow up  test results.  Signed, Satira Sark, MD, [facility] 07/13/2021 2:22 PM    [place] [facility] [place], [place], [place] [address] Phone: [phone];  Fax: [phone]

## 2021-07-13 NOTE — Telephone Encounter (Signed)
Checking percert on the following patient for testing scheduled at [facility].      [place] 07-28-2021

## 2021-07-28 ENCOUNTER — Other Ambulatory Visit: Payer: Self-pay

## 2021-07-28 ENCOUNTER — Ambulatory Visit (HOSPITAL_COMMUNITY)
Admission: RE | Admit: 2021-07-28 | Discharge: 2021-07-28 | Disposition: A | Discharge status: Home / Self Care | Payer: Medicare Other | Source: Ambulatory Visit | Attending: Cardiology | Admitting: Cardiology

## 2021-07-28 DIAGNOSIS — R0602 Shortness of breath: Secondary | ICD-10-CM | POA: Insufficient documentation

## 2021-07-28 LAB — ECHOCARDIOGRAM COMPLETE
Area-P 1/2: 3.58 cm2
S' Lateral: 2.3 cm

## 2021-07-28 MED ORDER — PERFLUTREN LIPID MICROSPHERE
1.0000 mL | INTRAVENOUS | Status: AC | PRN
  Administered 2021-07-28: 2 mL via INTRAVENOUS
  Filled 2021-07-28: qty 10

## 2021-08-03 ENCOUNTER — Telehealth: Payer: Self-pay | Admitting: *Deleted

## 2021-08-03 NOTE — Telephone Encounter (Signed)
-----   Message from Erma Heritage, [place] sent at 07/28/2021  5:06 PM EDT ----- Covering for Dr. Domenic Polite - Please let the patient know her echocardiogram shows normal pumping function of the heart with a preserved ejection fraction of 60-65%. No wall motion abnormalities. She does have mild thickness of the heart muscle and abnormal relaxation which is common to see with age and elevated BP. Would continue with Lasix for fluid management. No significant valve abnormalities.

## 2021-08-12 NOTE — Telephone Encounter (Signed)
Ronnie LPN informed and copy faxed to [phone] [facility])

## 2021-11-18 ENCOUNTER — Emergency Department (HOSPITAL_COMMUNITY): Payer: Medicare Other

## 2021-11-18 ENCOUNTER — Other Ambulatory Visit: Payer: Self-pay

## 2021-11-18 ENCOUNTER — Encounter (HOSPITAL_COMMUNITY): Payer: Self-pay

## 2021-11-18 ENCOUNTER — Inpatient Hospital Stay (HOSPITAL_COMMUNITY)
Admission: EM | Admit: 2021-11-18 | Discharge: 2021-12-20 | DRG: 871 | Disposition: E | Payer: Medicare Other | Source: Skilled Nursing Facility | Attending: Internal Medicine | Admitting: Internal Medicine

## 2021-11-18 DIAGNOSIS — E2740 Unspecified adrenocortical insufficiency: Secondary | ICD-10-CM | POA: Diagnosis present

## 2021-11-18 DIAGNOSIS — Z20822 Contact with and (suspected) exposure to covid-19: Secondary | ICD-10-CM | POA: Diagnosis present

## 2021-11-18 DIAGNOSIS — J440 Chronic obstructive pulmonary disease with acute lower respiratory infection: Secondary | ICD-10-CM | POA: Diagnosis present

## 2021-11-18 DIAGNOSIS — G9341 Metabolic encephalopathy: Secondary | ICD-10-CM | POA: Diagnosis present

## 2021-11-18 DIAGNOSIS — Z7982 Long term (current) use of aspirin: Secondary | ICD-10-CM

## 2021-11-18 DIAGNOSIS — Z6839 Body mass index (BMI) 39.0-39.9, adult: Secondary | ICD-10-CM | POA: Diagnosis not present

## 2021-11-18 DIAGNOSIS — R6510 Systemic inflammatory response syndrome (SIRS) of non-infectious origin without acute organ dysfunction: Secondary | ICD-10-CM | POA: Diagnosis not present

## 2021-11-18 DIAGNOSIS — J441 Chronic obstructive pulmonary disease with (acute) exacerbation: Secondary | ICD-10-CM | POA: Diagnosis present

## 2021-11-18 DIAGNOSIS — E871 Hypo-osmolality and hyponatremia: Secondary | ICD-10-CM | POA: Diagnosis present

## 2021-11-18 DIAGNOSIS — I110 Hypertensive heart disease with heart failure: Secondary | ICD-10-CM | POA: Diagnosis present

## 2021-11-18 DIAGNOSIS — Z79890 Hormone replacement therapy (postmenopausal): Secondary | ICD-10-CM

## 2021-11-18 DIAGNOSIS — I10 Essential (primary) hypertension: Secondary | ICD-10-CM | POA: Diagnosis present

## 2021-11-18 DIAGNOSIS — R0600 Dyspnea, unspecified: Secondary | ICD-10-CM

## 2021-11-18 DIAGNOSIS — E872 Acidosis, unspecified: Secondary | ICD-10-CM | POA: Diagnosis present

## 2021-11-18 DIAGNOSIS — J1000 Influenza due to other identified influenza virus with unspecified type of pneumonia: Secondary | ICD-10-CM | POA: Diagnosis present

## 2021-11-18 DIAGNOSIS — Z880 Allergy status to penicillin: Secondary | ICD-10-CM

## 2021-11-18 DIAGNOSIS — G8929 Other chronic pain: Secondary | ICD-10-CM | POA: Diagnosis present

## 2021-11-18 DIAGNOSIS — K7460 Unspecified cirrhosis of liver: Secondary | ICD-10-CM | POA: Diagnosis present

## 2021-11-18 DIAGNOSIS — E782 Mixed hyperlipidemia: Secondary | ICD-10-CM | POA: Diagnosis present

## 2021-11-18 DIAGNOSIS — R6520 Severe sepsis without septic shock: Secondary | ICD-10-CM | POA: Diagnosis present

## 2021-11-18 DIAGNOSIS — I5032 Chronic diastolic (congestive) heart failure: Secondary | ICD-10-CM | POA: Diagnosis present

## 2021-11-18 DIAGNOSIS — J189 Pneumonia, unspecified organism: Secondary | ICD-10-CM

## 2021-11-18 DIAGNOSIS — Z90710 Acquired absence of both cervix and uterus: Secondary | ICD-10-CM

## 2021-11-18 DIAGNOSIS — I69351 Hemiplegia and hemiparesis following cerebral infarction affecting right dominant side: Secondary | ICD-10-CM | POA: Diagnosis not present

## 2021-11-18 DIAGNOSIS — F419 Anxiety disorder, unspecified: Secondary | ICD-10-CM | POA: Diagnosis present

## 2021-11-18 DIAGNOSIS — Z833 Family history of diabetes mellitus: Secondary | ICD-10-CM

## 2021-11-18 DIAGNOSIS — J9601 Acute respiratory failure with hypoxia: Secondary | ICD-10-CM | POA: Diagnosis present

## 2021-11-18 DIAGNOSIS — J9602 Acute respiratory failure with hypercapnia: Secondary | ICD-10-CM | POA: Diagnosis present

## 2021-11-18 DIAGNOSIS — J101 Influenza due to other identified influenza virus with other respiratory manifestations: Secondary | ICD-10-CM | POA: Diagnosis present

## 2021-11-18 DIAGNOSIS — I471 Supraventricular tachycardia: Secondary | ICD-10-CM | POA: Diagnosis present

## 2021-11-18 DIAGNOSIS — E261 Secondary hyperaldosteronism: Secondary | ICD-10-CM | POA: Diagnosis present

## 2021-11-18 DIAGNOSIS — E039 Hypothyroidism, unspecified: Secondary | ICD-10-CM | POA: Diagnosis present

## 2021-11-18 DIAGNOSIS — Z79899 Other long term (current) drug therapy: Secondary | ICD-10-CM

## 2021-11-18 DIAGNOSIS — E1165 Type 2 diabetes mellitus with hyperglycemia: Secondary | ICD-10-CM | POA: Diagnosis present

## 2021-11-18 DIAGNOSIS — Z881 Allergy status to other antibiotic agents status: Secondary | ICD-10-CM

## 2021-11-18 DIAGNOSIS — F32A Depression, unspecified: Secondary | ICD-10-CM | POA: Diagnosis present

## 2021-11-18 DIAGNOSIS — A419 Sepsis, unspecified organism: Secondary | ICD-10-CM | POA: Diagnosis present

## 2021-11-18 DIAGNOSIS — Y95 Nosocomial condition: Secondary | ICD-10-CM | POA: Diagnosis present

## 2021-11-18 DIAGNOSIS — G4733 Obstructive sleep apnea (adult) (pediatric): Secondary | ICD-10-CM | POA: Diagnosis present

## 2021-11-18 DIAGNOSIS — Z66 Do not resuscitate: Secondary | ICD-10-CM | POA: Diagnosis present

## 2021-11-18 DIAGNOSIS — I959 Hypotension, unspecified: Secondary | ICD-10-CM | POA: Diagnosis not present

## 2021-11-18 DIAGNOSIS — Z9049 Acquired absence of other specified parts of digestive tract: Secondary | ICD-10-CM

## 2021-11-18 DIAGNOSIS — Z515 Encounter for palliative care: Secondary | ICD-10-CM

## 2021-11-18 DIAGNOSIS — F17210 Nicotine dependence, cigarettes, uncomplicated: Secondary | ICD-10-CM | POA: Diagnosis present

## 2021-11-18 DIAGNOSIS — J188 Other pneumonia, unspecified organism: Secondary | ICD-10-CM | POA: Diagnosis present

## 2021-11-18 DIAGNOSIS — Z825 Family history of asthma and other chronic lower respiratory diseases: Secondary | ICD-10-CM

## 2021-11-18 LAB — RESP PANEL BY RT-PCR (FLU A&B, COVID) ARPGX2
Influenza A by PCR: POSITIVE — AB
Influenza B by PCR: NEGATIVE
SARS Coronavirus 2 by RT PCR: NEGATIVE

## 2021-11-18 LAB — CBC WITH DIFFERENTIAL/PLATELET
Band Neutrophils: 14 %
Basophils Absolute: 0 10*3/uL (ref 0.0–0.1)
Basophils Relative: 0 %
Eosinophils Absolute: 0 10*3/uL (ref 0.0–0.5)
Eosinophils Relative: 0 %
HCT: 43.1 % (ref 36.0–46.0)
Hemoglobin: 13.9 g/dL (ref 12.0–15.0)
Lymphocytes Relative: 5 %
Lymphs Abs: 0.7 10*3/uL (ref 0.7–4.0)
MCH: 27.8 pg (ref 26.0–34.0)
MCHC: 32.3 g/dL (ref 30.0–36.0)
MCV: 86.2 fL (ref 80.0–100.0)
Monocytes Absolute: 0.4 10*3/uL (ref 0.1–1.0)
Monocytes Relative: 3 %
Neutro Abs: 13.1 10*3/uL — ABNORMAL HIGH (ref 1.7–7.7)
Neutrophils Relative %: 78 %
Platelets: 146 10*3/uL — ABNORMAL LOW (ref 150–400)
RBC: 5 MIL/uL (ref 3.87–5.11)
RDW: 15.2 % (ref 11.5–15.5)
WBC Morphology: >20
WBC: 14.2 10*3/uL — ABNORMAL HIGH (ref 4.0–10.5)
nRBC: 0 % (ref 0.0–0.2)

## 2021-11-18 LAB — COMPREHENSIVE METABOLIC PANEL
ALT: 19 U/L (ref 0–44)
AST: 21 U/L (ref 15–41)
Albumin: 3.5 g/dL (ref 3.5–5.0)
Alkaline Phosphatase: 97 U/L (ref 38–126)
Anion gap: 11 (ref 5–15)
BUN: 27 mg/dL — ABNORMAL HIGH (ref 8–23)
CO2: 22 mmol/L (ref 22–32)
Calcium: 8.9 mg/dL (ref 8.9–10.3)
Chloride: 93 mmol/L — ABNORMAL LOW (ref 98–111)
Creatinine, Ser: 1.24 mg/dL — ABNORMAL HIGH (ref 0.44–1.00)
GFR, Estimated: 49 mL/min — ABNORMAL LOW (ref >60)
Glucose, Bld: 384 mg/dL — ABNORMAL HIGH (ref 70–99)
Potassium: 4.7 mmol/L (ref 3.5–5.1)
Sodium: 126 mmol/L — ABNORMAL LOW (ref 135–145)
Total Bilirubin: 0.5 mg/dL (ref 0.3–1.2)
Total Protein: 7.5 g/dL (ref 6.5–8.1)

## 2021-11-18 LAB — BLOOD GAS, ARTERIAL
Acid-base deficit: 1.6 mmol/L (ref 0.0–2.0)
Acid-base deficit: 2.9 mmol/L — ABNORMAL HIGH (ref 0.0–2.0)
Bicarbonate: 21.9 mmol/L (ref 20.0–28.0)
Bicarbonate: 21 mmol/L (ref 20.0–28.0)
Drawn by: 41977
Drawn by: 21310
FIO2: 80
FIO2: 60
O2 Saturation: 91.6 %
O2 Saturation: 86.2 %
Patient temperature: 37
Patient temperature: 37.6
pCO2 arterial: 55.9 mmHg — ABNORMAL HIGH (ref 32.0–48.0)
pCO2 arterial: 51.3 mmHg — ABNORMAL HIGH (ref 32.0–48.0)
pH, Arterial: 7.264 — ABNORMAL LOW (ref 7.350–7.450)
pH, Arterial: 7.273 — ABNORMAL LOW (ref 7.350–7.450)
pO2, Arterial: 77.1 mmHg — ABNORMAL LOW (ref 83.0–108.0)
pO2, Arterial: 60.6 mmHg — ABNORMAL LOW (ref 83.0–108.0)

## 2021-11-18 LAB — URINALYSIS, ROUTINE W REFLEX MICROSCOPIC
Bilirubin Urine: NEGATIVE
Glucose, UA: 250 mg/dL — AB
Ketones, ur: NEGATIVE mg/dL
Leukocytes,Ua: NEGATIVE
Nitrite: NEGATIVE
Protein, ur: 30 mg/dL — AB
Specific Gravity, Urine: 1.02 (ref 1.005–1.030)
pH: 6 (ref 5.0–8.0)

## 2021-11-18 LAB — GLUCOSE, CAPILLARY
Glucose-Capillary: 338 mg/dL — ABNORMAL HIGH (ref 70–99)
Glucose-Capillary: 306 mg/dL — ABNORMAL HIGH (ref 70–99)

## 2021-11-18 LAB — URINALYSIS, MICROSCOPIC (REFLEX)

## 2021-11-18 LAB — BRAIN NATRIURETIC PEPTIDE: B Natriuretic Peptide: 177 pg/mL — ABNORMAL HIGH (ref 0.0–100.0)

## 2021-11-18 LAB — MRSA NEXT GEN BY PCR, NASAL: MRSA by PCR Next Gen: DETECTED — AB

## 2021-11-18 LAB — LACTIC ACID, PLASMA
Lactic Acid, Venous: 2.9 mmol/L (ref 0.5–1.9)
Lactic Acid, Venous: 4.1 mmol/L (ref 0.5–1.9)

## 2021-11-18 LAB — MAGNESIUM: Magnesium: 1.5 mg/dL — ABNORMAL LOW (ref 1.7–2.4)

## 2021-11-18 LAB — PROCALCITONIN: Procalcitonin: 5.23 ng/mL

## 2021-11-18 LAB — STREP PNEUMONIAE URINARY ANTIGEN: Strep Pneumo Urinary Antigen: NEGATIVE

## 2021-11-18 MED ORDER — METOPROLOL TARTRATE 5 MG/5ML IV SOLN: 5.0000 mg | INTRAVENOUS | Status: DC | PRN

## 2021-11-18 MED ORDER — ONDANSETRON HCL 4 MG/2ML IJ SOLN: 4.0000 mg | Freq: Four times a day (QID) | INTRAMUSCULAR | Status: DC | PRN

## 2021-11-18 MED ORDER — INSULIN ASPART 100 UNIT/ML IJ SOLN
0.0000 [IU] | INTRAMUSCULAR | Status: DC
  Administered 2021-11-18 – 2021-11-19 (×2): 11 [IU] via SUBCUTANEOUS
  Administered 2021-11-19: 8 [IU] via SUBCUTANEOUS

## 2021-11-18 MED ORDER — IPRATROPIUM-ALBUTEROL 0.5-2.5 (3) MG/3ML IN SOLN: 3.0000 mL | RESPIRATORY_TRACT | Status: DC | PRN

## 2021-11-18 MED ORDER — LORAZEPAM 2 MG/ML IJ SOLN
0.5000 mg | Freq: Once | INTRAMUSCULAR | Status: AC
  Administered 2021-11-18: 0.5 mg via INTRAVENOUS
  Filled 2021-11-18: qty 1

## 2021-11-18 MED ORDER — ONDANSETRON HCL 4 MG PO TABS: 4.0000 mg | ORAL_TABLET | Freq: Four times a day (QID) | ORAL | Status: DC | PRN

## 2021-11-18 MED ORDER — VANCOMYCIN HCL 1500 MG/300ML IV SOLN: 1500.0000 mg | INTRAVENOUS | Status: DC

## 2021-11-18 MED ORDER — SODIUM CHLORIDE 0.9 % IV BOLUS: 500.0000 mL | Freq: Once | INTRAVENOUS | Status: DC

## 2021-11-18 MED ORDER — METHYLPREDNISOLONE SODIUM SUCC 125 MG IJ SOLR: 60.0000 mg | Freq: Two times a day (BID) | INTRAMUSCULAR | Status: DC

## 2021-11-18 MED ORDER — NOREPINEPHRINE 4 MG/250ML-% IV SOLN
2.0000 ug/min | INTRAVENOUS | Status: DC
  Administered 2021-11-18: 4 ug/min via INTRAVENOUS
  Filled 2021-11-18: qty 250

## 2021-11-18 MED ORDER — POLYETHYLENE GLYCOL 3350 17 G PO PACK: 17.0000 g | PACK | Freq: Every day | ORAL | Status: DC | PRN

## 2021-11-18 MED ORDER — SODIUM CHLORIDE 0.9 % IV SOLN: INTRAVENOUS | Status: DC

## 2021-11-18 MED ORDER — SODIUM CHLORIDE 0.9 % IV BOLUS: 1000.0000 mL | Freq: Once | INTRAVENOUS | Status: DC

## 2021-11-18 MED ORDER — SODIUM CHLORIDE 0.9 % IV SOLN
2.0000 g | INTRAVENOUS | Status: DC
  Administered 2021-11-18: 2 g via INTRAVENOUS
  Filled 2021-11-18: qty 20

## 2021-11-18 MED ORDER — OSELTAMIVIR PHOSPHATE 75 MG PO CAPS: 75.0000 mg | ORAL_CAPSULE | Freq: Two times a day (BID) | ORAL | Status: DC

## 2021-11-18 MED ORDER — VANCOMYCIN HCL 2000 MG/400ML IV SOLN
2000.0000 mg | Freq: Once | INTRAVENOUS | Status: AC
  Administered 2021-11-18: 2000 mg via INTRAVENOUS
  Filled 2021-11-18: qty 400

## 2021-11-18 MED ORDER — METOPROLOL TARTRATE 5 MG/5ML IV SOLN
5.0000 mg | INTRAVENOUS | Status: DC | PRN
  Administered 2021-11-18: 5 mg via INTRAVENOUS
  Filled 2021-11-18: qty 5

## 2021-11-18 MED ORDER — ACETAMINOPHEN 650 MG RE SUPP: 650.0000 mg | Freq: Four times a day (QID) | RECTAL | Status: DC | PRN

## 2021-11-18 MED ORDER — ENOXAPARIN SODIUM 60 MG/0.6ML IJ SOSY: 0.5000 mg/kg | PREFILLED_SYRINGE | INTRAMUSCULAR | Status: DC

## 2021-11-18 MED ORDER — SODIUM CHLORIDE 0.9 % IV SOLN: 500.0000 mg | INTRAVENOUS | Status: DC

## 2021-11-18 MED ORDER — ACETAMINOPHEN 325 MG PO TABS: 650.0000 mg | ORAL_TABLET | Freq: Four times a day (QID) | ORAL | Status: DC | PRN

## 2021-11-18 MED ORDER — ENOXAPARIN SODIUM 40 MG/0.4ML IJ SOSY
40.0000 mg | PREFILLED_SYRINGE | INTRAMUSCULAR | Status: DC
  Administered 2021-11-18: 40 mg via SUBCUTANEOUS
  Filled 2021-11-18: qty 0.4

## 2021-11-18 MED ORDER — SODIUM CHLORIDE 0.9 % IV SOLN
2.0000 g | Freq: Once | INTRAVENOUS | Status: AC
  Administered 2021-11-18: 2 g via INTRAVENOUS
  Filled 2021-11-18: qty 2

## 2021-11-18 MED ORDER — SODIUM CHLORIDE 0.9 % IV SOLN
100.0000 mg | Freq: Two times a day (BID) | INTRAVENOUS | Status: DC
Start: 2021-11-18 — End: 2021-11-19
  Administered 2021-11-18: 100 mg via INTRAVENOUS
  Filled 2021-11-18 (×3): qty 100

## 2021-11-18 MED ORDER — SODIUM CHLORIDE 0.9 % IV SOLN: 250.0000 mL | INTRAVENOUS | Status: DC

## 2021-11-18 MED ORDER — HYDROCODONE-ACETAMINOPHEN 5-325 MG PO TABS: 1.0000 | ORAL_TABLET | Freq: Two times a day (BID) | ORAL | Status: DC | PRN

## 2021-11-18 MED ORDER — IPRATROPIUM-ALBUTEROL 0.5-2.5 (3) MG/3ML IN SOLN
3.0000 mL | Freq: Four times a day (QID) | RESPIRATORY_TRACT | Status: DC
  Administered 2021-11-18: 3 mL via RESPIRATORY_TRACT
  Filled 2021-11-18: qty 3

## 2021-11-18 MED ORDER — SODIUM CHLORIDE 0.9 % IV BOLUS
1000.0000 mL | Freq: Once | INTRAVENOUS | Status: AC
  Administered 2021-11-18: 1000 mL via INTRAVENOUS

## 2021-11-18 MED ORDER — SODIUM CHLORIDE 0.9 % IV SOLN
12.5000 mg | Freq: Three times a day (TID) | INTRAVENOUS | Status: DC | PRN
  Filled 2021-11-18: qty 0.5

## 2021-11-18 MED ORDER — MORPHINE SULFATE (PF) 2 MG/ML IV SOLN
2.0000 mg | Freq: Once | INTRAVENOUS | Status: AC
  Administered 2021-11-18: 2 mg via INTRAVENOUS
  Filled 2021-11-18: qty 1

## 2021-11-18 MED ORDER — DEXAMETHASONE SODIUM PHOSPHATE 10 MG/ML IJ SOLN
10.0000 mg | Freq: Once | INTRAMUSCULAR | Status: AC
  Administered 2021-11-18: 10 mg via INTRAVENOUS
  Filled 2021-11-18: qty 1

## 2021-11-18 MED ORDER — OSELTAMIVIR PHOSPHATE 30 MG PO CAPS: 30.0000 mg | ORAL_CAPSULE | Freq: Two times a day (BID) | ORAL | Status: DC

## 2021-11-18 MED ORDER — MAGNESIUM SULFATE 2 GM/50ML IV SOLN
2.0000 g | Freq: Once | INTRAVENOUS | Status: AC
  Administered 2021-11-18: 2 g via INTRAVENOUS
  Filled 2021-11-18: qty 50

## 2021-11-18 NOTE — Progress Notes (Signed)
Alyssa from ED called to report that she did not obtain a flu or COVID swab on the patient while the patient was in the ED, ICU RN Joellen Jersey made aware

## 2021-11-18 NOTE — Progress Notes (Signed)
BIPAP changes made due to blood gases and decreased sats on monitor  fio 2increased to 70% and ipap increased to 14

## 2021-11-18 NOTE — ED Provider Notes (Signed)
[facility] Provider Note   CSN: [id number] Arrival date & time: 11/04/2021  1349     History Chief Complaint  Patient presents with   Shortness of Breath    Deborah Russo is a 60 y.o. female.  HPI Patient presents in respiratory distress per nursing facility.  History is obtained by those individuals that the patient is having a difficult time speaking, is somnolent, but does awaken to stimuli, answer some questions.  Level 5 caveat secondary to acuity of condition. From nursing facility the patient reportedly has been dyspneic for the past 2 days, now with altered mental status, sleepiness. Patient has multiple medical issues on chart review including COPD, is on oxygen at home. EMS reports the patient was somnolent, hypoxic in route.    Past Medical History:  Diagnosis Date   Anxiety    Atherosclerosis of aorta (HCC)    Chronic abdominal pain    Chronic migraine    Chronic pain    Cirrhosis (HCC)    COPD (chronic obstructive pulmonary disease) ([place])    Depression    Essential hypertension    Hypothyroidism    Lumbar compression fracture (HCC)    Mixed hyperlipidemia    Nausea, vomiting, and diarrhea    Secondary hyperaldosteronism ([place])    Sleep apnea    Stroke [facility])     Patient Active Problem List   Diagnosis Date Noted   Vitamin A deficiency 10/09/2020   Malabsorption 87/57/9728   Acute metabolic encephalopathy 20/60/1561   SDH (subdural hematoma)    Hemiparesis of right dominant side as late effect of cerebral infarction ([place]) 02/06/2019   Vitamin D deficiency 02/06/2019   Mixed hyperlipidemia 02/06/2019   Gastroesophageal reflux disease without esophagitis 02/06/2019   B12 deficiency 02/06/2019   Thrombocytopenia ([place]) 02/06/2019   Adrenal insufficiency ([place]) 02/06/2019   Morbid obesity ([place]) 02/06/2019   Prediabetes 09/21/2018   Chronic diarrhea    Polypharmacy    DNR (do not resuscitate) discussion    Current smoker  03/29/2017   HTN (hypertension) 03/29/2017   Aortic atherosclerosis ([place]) 09/29/2016   Chronic migraine 09/29/2016   H/O Clostridium difficile infection 07/13/2014   Diarrhea 07/13/2014   FTT (failure to thrive) in adult 07/13/2014   Chronic abdominal pain    Hypothyroidism 06/19/2014   Hypokalemia 06/19/2014   Chronic pain 06/19/2014   Anemia 06/19/2014    Past Surgical History:  Procedure Laterality Date   ABDOMINAL HYSTERECTOMY     ABDOMINAL SURGERY     BIOPSY N/A 07/17/2014   Procedure: BIOPSY;  Surgeon: Rogene Houston, MD;  Location: [facility] ORS;  Service: Endoscopy;  Laterality: N/A;   BIOPSY  06/16/2018   Procedure: BIOPSY;  Surgeon: [name], MD;  Location: [facility] ENDOSCOPY;  Service: Endoscopy;;   BIOPSY  09/03/2020   Procedure: BIOPSY;  Surgeon: Montez Morita, Quillian Quince, MD;  Location: [facility] ENDO SUITE;  Service: Gastroenterology;;  terminal ileum, random,anal   BREAST SURGERY Left    removed nipple   CESAREAN SECTION     CHOLECYSTECTOMY     COLONOSCOPY WITH PROPOFOL  09/03/2020   Procedure: COLONOSCOPY WITH PROPOFOL;  Surgeon: Harvel Quale, MD;  Location: [facility] ENDO SU[facility];  Service: Gastroenterology;;   ESOPHAGOGASTRODUODENOSCOPY N/A 07/17/2014   Procedure: ESOPHAGOGASTRODUODENOSCOPY (EGD);  Surgeon: Rogene Houston, MD;  Location: [facility] OR[facility];  Service: Endoscopy;  Laterality: N/A;   FLEXIBLE SIGMOIDOSCOPY N/A 06/16/2018   Procedure: FLEXIBLE SIGMOIDOSCOPY;  Surgeon: [name], MD;  Location: [facility];  Service:  Endoscopy;  Laterality: N/A;     OB History     Gravida  2   Para  1   Term  1   Preterm      AB  1   Living         SAB      IAB  1   Ectopic      Multiple      Live Births              Family History  Problem Relation Age of Onset   Asthma Mother    Diabetes Mother    Cancer Father     Social History   Tobacco Use   Smoking status: Every Day    Packs/day: 0.50    Years: 41.00    Pack years: 20.50    Types:  Cigarettes   Smokeless tobacco: Never  Vaping Use   Vaping Use: Never used  Substance Use Topics   Alcohol use: No   Drug use: Yes    Frequency: 2.0 times per week    Types: Oxycodone    Home Medications Prior to Admission medications   Medication Sig Start Date End Date Taking? Authorizing Provider  acetaminophen (TYLENOL) 650 MG CR tablet Take 650 mg by mouth every 6 (six) hours as needed for pain.    [provider]  albuterol (VENTOLIN HFA) 108 (90 Base) MCG/ACT inhaler Inhale 2 puffs into the lungs every 6 (six) hours as needed for wheezing or shortness of breath.    [provider]  aspirin 325 MG tablet Take 1 tablet (325 mg total) by mouth daily. 02/06/19   Baruch Gouty, FNP  atorvastatin (LIPITOR) 20 MG tablet Take 20 mg by mouth daily. 06/12/21   [provider]  Beta Carotene (VITAMIN A) 25000 UNIT capsule Take 25,000 Units by mouth daily.    [provider]  busPIRone (BUSPAR) 15 MG tablet Take 15 mg by mouth 2 (two) times daily. 07/02/21   [provider]  Cholecalciferol (VITAMIN D3) 75 MCG (3000 UT) TABS Take 1 tablet by mouth daily.    [provider]  Emollient (EUCERIN PLUS) 5-5 % LOTN Apply topically.    [provider]  fluticasone (FLONASE) 50 MCG/ACT nasal spray Place 1 spray into both nostrils daily. prn    [provider]  folic acid (FOLVITE) 1 MG tablet Take 1 tablet (1 mg total) by mouth daily. 09/09/20   Swayze, Ava, DO  furosemide (LASIX) 20 MG tablet Take 20 mg by mouth daily.    [provider]  INCRUSE ELLIPTA 62.5 MCG/INH AEPB Inhale 1 puff into the lungs daily. 07/02/21   [provider]  levothyroxine (SYNTHROID) 200 MCG tablet Take 200 mcg by mouth daily. 06/12/21   [provider]  loratadine (CLARITIN) 10 MG tablet Take 10 mg by mouth daily.    [provider]  magnesium oxide (MAG-OX) 400 MG tablet Take 400 mg by mouth daily.    [provider]  metoprolol tartrate (LOPRESSOR) 25 MG tablet Take 0.5 tablets (12.5 mg total) by mouth 2 (two) times daily. 07/13/21   Satira Sark, MD  nitroGLYCERIN (NITROSTAT) 0.4 MG SL tablet Place 0.4 mg under the tongue every 5 (five) minutes as needed for chest pain.    [provider]  pantoprazole (PROTONIX) 20 MG tablet Take 20 mg by mouth 2 (two) times daily. 06/12/21   [provider]  potassium chloride SA (KLOR-CON) 20  MEQ tablet Take 20 mEq by mouth daily. 06/12/21   [provider]  pregabalin (LYRICA) 100 MG capsule Take 100 mg by mouth 2 (two) times daily.    [provider]  senna (SENOKOT) 8.6 MG tablet Take 2 tablets by mouth daily.    [provider]  sertraline (ZOLOFT) 50 MG tablet Take 150 mg by mouth daily.    [provider]  thiamine 100 MG tablet Take 1 tablet (100 mg total) by mouth daily. 09/08/20   Swayze, Ava, DO  traZODone (DESYREL) 100 MG tablet Take 100 mg by mouth at bedtime.    [provider]  traZODone (DESYREL) 50 MG tablet Take 25 mg by mouth at bedtime as needed. 06/12/21   [provider]  ursodiol (ACTIGALL) 250 MG tablet Take 1 tablet (250 mg total) by mouth 3 (three) times daily. 11/05/20   Harvel Quale, MD  vitamin B-12 1000 MCG tablet Take 1 tablet (1,000 mcg total) by mouth daily. 09/09/20   Swayze, Ava, DO  XTAMPZA ER 36 MG C12A Take 1 capsule by mouth 2 (two) times daily. 07/04/21   [provider]    Allergies    Penicillins and Sulfa antibiotics  Review of Systems   Review of Systems  Unable to perform ROS: Acuity of condition   Physical Exam Updated Vital Signs BP (!) 158/77   Pulse (!) 130   Temp (!) 102.1 F (38.9 C) (Rectal)   Resp (!) 31   Ht 5' 2"  (1.575 m)   Wt 99 kg   SpO2 100%   BMI 39.92 kg/m   Physical Exam Vitals and nursing note reviewed.  Constitutional:      General: She is in acute distress.     Appearance: She is  well-developed. She is obese. She is ill-appearing and diaphoretic.  HENT:     Head: Normocephalic and atraumatic.  Eyes:     Conjunctiva/sclera: Conjunctivae normal.  Cardiovascular:     Rate and Rhythm: Regular rhythm. Tachycardia present.  Pulmonary:     Effort: Tachypnea, accessory muscle usage and respiratory distress present.     Breath sounds: Decreased breath sounds and rhonchi present.  Abdominal:     General: There is no distension.  Skin:    General: Skin is warm.  Neurological:     Mental Status: She is alert.     Cranial Nerves: No cranial nerve deficit.     Comments: Sleepy, awakens easily, offered brief verbal responses, but then again is sleeping.  Patient does move all extremity spontaneously, has notable atrophy in spite of her obesity.  Psychiatric:        Cognition and Memory: Cognition is impaired.    ED Results / Procedures / Treatments   Labs (all labs ordered are listed, but only abnormal results are displayed) Labs Reviewed  COMPREHENSIVE METABOLIC PANEL - Abnormal; Notable for the following components:      Result Value   Sodium 126 (*)    Chloride 93 (*)    Glucose, Bld 384 (*)    BUN 27 (*)    Creatinine, Ser 1.24 (*)    GFR, Estimated 49 (*)    All other components within normal limits  CBC WITH DIFFERENTIAL/PLATELET - Abnormal; Notable for the following components:   WBC 14.2 (*)    Platelets 146 (*)    All other components within normal limits  BRAIN NATRIURETIC PEPTIDE - Abnormal; Notable for the following components:   B Natriuretic Peptide 177.0 (*)  All other components within normal limits  BLOOD GAS, ARTERIAL - Abnormal; Notable for the following components:   pH, Arterial 7.264 (*)    pCO2 arterial 55.9 (*)    pO2, Arterial 77.1 (*)    All other components within normal limits  RESP PANEL BY RT-PCR (FLU A&B, COVID) [id number]    EKG EKG Interpretation  Date/Time:  Wednesday November 18 2021 14:12:36 EST Ventricular Rate:   132 PR Interval:  134 QRS Duration: 99 QT Interval:  343 QTC Calculation: 509 R Axis:   68 Text Interpretation: Ectopic atrial tachycardia, unifocal Atrial premature complex Prolonged QT interval Baseline wander in lead(s) V1 V2 V5 Abnormal ECG Confirmed by Carmin Muskrat [phone]) on 10/22/2021 2:51:33 PM  Radiology DG Chest Port 1 View  Result Date: 11/17/2021 CLINICAL DATA:  Shortness of breath and respiratory distress. EXAM: PORTABLE CHEST 1 VIEW COMPARISON:  08/28/2020 FINDINGS: Lungs are suboptimally inflated. Heart size appears enlarged. Aortic atherosclerosis. Airspace opacities identified within the right lung base and left midlung. Visualized osseous structures appear intact. IMPRESSION: 1. Opacities within the right lung base and left midlung. Findings suspicious for multifocal pneumonia. Followup PA and lateral chest X-ray is recommended in 3-4 weeks following trial of antibiotic therapy to ensure resolution and exclude underlying malignancy. 2. Cardiac enlargement. 3.  Aortic Atherosclerosis (ICD10-I70.0). Electronically Signed   By: Kerby Moors M.D.   On: 11/16/2021 14:37    Procedures Procedures   Medications Ordered in ED Medications  aztreonam (AZACTAM) 2 g in sodium chloride 0.9 % 100 mL IVPB (has no administration in time range)  dexamethasone (DECADRON) injection 10 mg (has no administration in time range)    ED Course  I have reviewed the triage vital signs and the nursing notes.  Pertinent labs & imaging results that were available during my care of the patient were reviewed by me and considered in my medical decision making (see chart for details). With concern for COPD exacerbation, listlessness patient was started on BiPAP. Patient is noted to be DNR.  Pulse ox 94% with nonrebreather mask Cardiac 130 sinus tach abnormal 2:56 PM Patient moving BiPAP.  I reviewed her x-ray, labs, thus far patient found to have evidence for multifocal pneumonia, with fever,  tachycardia, tachypnea, meets sepsis criteria.  Patient is not hypotensive she is mentating appropriately, now has improved respiratory function after period of BiPAP.  With concern for sepsis, likely secondary to pneumonia, COVID and influenza both pending, patient will require admission.  She is DNR, stepdown admission reasonable.    MDM Rules/Calculators/A&P MDM Number of Diagnoses or Management Options HCAP (healthcare-associated pneumonia): new, needed workup Sepsis with acute hypercapnic respiratory failure without septic shock, due to unspecified organism [facility]): new, needed workup   Amount and/or Complexity of Data Reviewed Clinical lab tests: ordered and reviewed Tests in the radiology section of CPT: ordered and reviewed Tests in the medicine section of CPT: reviewed and ordered Decide to obtain previous medical records or to obtain history from someone other than the patient: yes Obtain history from someone other than the patient: yes Review and summarize past medical records: yes Discuss the patient with other providers: yes Independent visualization of images, tracings, or specimens: yes  Risk of Complications, Morbidity, and/or Mortality Presenting problems: high Diagnostic procedures: high Management options: high  Critical Care Total time providing critical care: 30-74 minutes (35)  Patient Progress Patient progress: stable   Final Clinical Impression(s) / ED Diagnoses Final diagnoses:  Sepsis with acute hypercapnic respiratory failure without septic  shock, due to unspecified organism ([place])  HCAP (healthcare-associated pneumonia)     Carmin Muskrat, MD 11/04/2021 1456

## 2021-11-18 NOTE — Progress Notes (Signed)
Patient arrived to unit on non-rebreather and with no Bipap machine from ED present. Order for continuous BiPAP placed at 1419. On arrival to unit pt tachycardic, tachypneic, shallow respirations, lethargic, and had generalized cyanosis. ED RN asked to go retrieve Bipap machine from ED. Bipap machine brought to unit and patient placed on previous settings. Symptoms noted above began to improve. Pt educated on importance of keeping mask on. Tolerating well at this time.

## 2021-11-18 NOTE — Progress Notes (Signed)
MD notified that pt's WOB of breathing has been increasing throughout shift w/ RR now in mid-30s, lungs have coarse crackles and rhonchi throughout. Pt observed to be abdominal breathing. 02 sats have been maintaining in mid-90s on 70% BiPap. Pt is becoming increasingly restless and has attempted to remove BiPap several times throughout shift. 1x dose of 0.18m ativan was administered @ 2300 w/ therapeutic effect.   Pt BP also trending down w/ MAP <65.   MD placed orders for CXR and levophed to maintain MAP >65%.   Will continue to monitor.

## 2021-11-18 NOTE — ED Triage Notes (Signed)
Pt arrived via EMS from [facility] with complaints of SOB. Per EMS pt O2 78% on 4L [place]. Pt O2 currently 91% on 15L Non rebreather. Two Albuterol tx at 1200. Staff at [employer] noticed pt was altered at [employer].

## 2021-11-18 NOTE — ED Notes (Addendum)
Pt continues to remove bipap. States that she does not want to wear it. Pt placed on non rebreather 15 L O2 93%. Adela Ports, RN notified RT. Pt educated on need for bipap due to work of breathing. Pt stated "I need to get up I am not wearing it." Pt stated that she would wear the non rebreather, but not the bipap. EDP aware.

## 2021-11-18 NOTE — H&P (Addendum)
History and Physical    Deborah Russo NTI:[id number] DOB: 01-Oct-1961 DOA: 10/28/2021  PCP: Baruch Gouty, FNP   Patient coming from: [facility]  I have personally briefly reviewed patient's old medical records in [place]  Chief Complaint: AMS  HPI: Deborah Russo is a 60 y.o. female with medical history significant for adrenal insufficiency, hypertension, liver cirrhosis, COPD, OSA, stroke. Patient was brought to the ED from [facility] with reports of altered mental status.  EMS reports that patient's O2 sats was 78% on 4 L, and she was placed on nonrebreather with sats improving to 91%.  Per chart review, patient had been dyspneic for about 2 days, with altered mental status and sleepiness.  At the time of my evaluation, patient is somnolent, would open her eyes to touch, she is on BiPAP.  Prior to my evaluation, patient's mental status had improved, was complaining of pain and requesting pain medications.  ED Course: Patient was placed on BiPAP with sats 91 to 95% . Febrile 102.1.  Tachycardic heart rate 130s.  Respiratory rate 26-33.  WBC 14.2.  Sodium 126.  BNP 177.  Chest x-ray shows multifocal pneumonia.  Influenza test positive.  pH 7.2, PCO2 55.  PO2 77.  EKG showing ectopic atrial tachycardia.  Patient was started on ceftriaxone and azithromycin, also concerning for COPD exacerbation with rhonchi present per ED provider notes.  Hospitalist to admit.   Review of Systems: As per HPI all other systems reviewed and negative.  Past Medical History:  Diagnosis Date   Anxiety    Atherosclerosis of aorta (HCC)    Chronic abdominal pain    Chronic migraine    Chronic pain    Cirrhosis (HCC)    COPD (chronic obstructive pulmonary disease) (HCC)    Depression    Essential hypertension    Hypothyroidism    Lumbar compression fracture (HCC)    Mixed hyperlipidemia    Nausea, vomiting, and diarrhea    Secondary hyperaldosteronism ([place])    Sleep apnea    Stroke  [facility])     Past Surgical History:  Procedure Laterality Date   ABDOMINAL HYSTERECTOMY     ABDOMINAL SURGERY     BIOPSY N/A 07/17/2014   Procedure: BIOPSY;  Surgeon: Rogene Houston, MD;  Location: [facility] ORS;  Service: Endoscopy;  Laterality: N/A;   BIOPSY  06/16/2018   Procedure: BIOPSY;  Surgeon: [name], MD;  Location: [facility] ENDOSCOPY;  Service: Endoscopy;;   BIOPSY  09/03/2020   Procedure: BIOPSY;  Surgeon: Montez Morita, Quillian Quince, MD;  Location: [facility] ENDO SUITE;  Service: Gastroenterology;;  terminal ileum, random,anal   BREAST SURGERY Left    removed nipple   CESAREAN SECTION     CHOLECYSTECTOMY     COLONOSCOPY WITH PROPOFOL  09/03/2020   Procedure: COLONOSCOPY WITH PROPOFOL;  Surgeon: Harvel Quale, MD;  Location: [facility] ENDO SUITE;  Service: Gastroenterology;;   ESOPHAGOGASTRODUODENOSCOPY N/A 07/17/2014   Procedure: ESOPHAGOGASTRODUODENOSCOPY (EGD);  Surgeon: Rogene Houston, MD;  Location: [facility] ORS;  Service: Endoscopy;  Laterality: N/A;   FLEXIBLE SIGMOIDOSCOPY N/A 06/16/2018   Procedure: FLEXIBLE SIGMOIDOSCOPY;  Surgeon: [name], MD;  Location: [facility] ENDOSCOPY;  Service: Endoscopy;  Laterality: N/A;     reports that she has been smoking cigarettes. She has a 20.50 pack-year smoking history. She has never used smokeless tobacco. She reports current drug use. Frequency: 2.00 times per week. Drug: Oxycodone. She reports that she does not drink alcohol.  Allergies  Allergen  Reactions   Penicillins Hives    *Tolerates zosyn Has patient had a PCN reaction causing immediate rash, facial/tongue/throat swelling, SOB or lightheadedness with hypotension: No Has patient had a PCN reaction causing severe rash involving mucus membranes or skin necrosis: No Has patient had a PCN reaction that required hospitalization No Has patient had a PCN reaction occurring within the last 10 years: No If all of the above answers are "NO", then may proceed with Cephalosporin use.    Sulfa  Antibiotics Hives    Family History  Problem Relation Age of Onset   Asthma Mother    Diabetes Mother    Cancer Father     Prior to Admission medications   Medication Sig Start Date End Date Taking? Authorizing Provider  albuterol (VENTOLIN HFA) 108 (90 Base) MCG/ACT inhaler Inhale 2 puffs into the lungs every 6 (six) hours as needed for wheezing or shortness of breath.   Yes [provider]  aspirin 325 MG tablet Take 1 tablet (325 mg total) by mouth daily. Patient taking differently: Take 81 mg by mouth daily. 02/06/19  Yes Rakes, Connye Burkitt, FNP  atorvastatin (LIPITOR) 20 MG tablet Take 20 mg by mouth daily. 06/12/21  Yes [provider]  Beta Carotene (VITAMIN A) 25000 UNIT capsule Take 25,000 Units by mouth daily.   Yes [provider]  busPIRone (BUSPAR) 15 MG tablet Take 15 mg by mouth 3 (three) times daily. 07/02/21  Yes [provider]  Cholecalciferol (VITAMIN D3) 75 MCG (3000 UT) TABS Take 1 tablet by mouth daily.   Yes [provider]  folic acid (FOLVITE) 1 MG tablet Take 1 tablet (1 mg total) by mouth daily. 09/09/20  Yes Swayze, Ava, DO  furosemide (LASIX) 20 MG tablet Take 20 mg by mouth daily.   Yes [provider]  HUMALOG 100 UNIT/ML injection Inject 5-8 Units into the skin. Sliding scale 150-250=5 units, 251=400=8 units subcutaneously before meals for diabetes. 09/07/21  Yes [provider]  INCRUSE ELLIPTA 62.5 MCG/INH AEPB Inhale 1 puff into the lungs daily. 07/02/21  Yes [provider]  ipratropium-albuterol (DUONEB) 0.5-2.5 (3) MG/3ML SOLN Take 3 mLs by nebulization once.   Yes [provider]  LANTUS 100 UNIT/ML injection Inject 20 Units into the skin daily. 09/07/21  Yes [provider]  levothyroxine (SYNTHROID) 200 MCG tablet Take 200 mcg by mouth daily. 06/12/21  Yes [provider]  LYRICA 75 MG capsule Take by mouth. 11/16/2021  Yes [provider]  magnesium oxide  (MAG-OX) 400 MG tablet Take 400 mg by mouth daily.   Yes [provider]  metFORMIN (GLUCOPHAGE) 500 MG tablet Take 500 mg by mouth 2 (two) times daily. 10/12/21  Yes [provider]  methylPREDNISolone sodium succinate (SOLU-MEDROL) 125 mg/2 mL injection Inject 125 mg into the vein once.   Yes [provider]  metoprolol tartrate (LOPRESSOR) 25 MG tablet Take 0.5 tablets (12.5 mg total) by mouth 2 (two) times daily. 07/13/21  Yes Satira Sark, MD  pantoprazole (PROTONIX) 20 MG tablet Take 20 mg by mouth 2 (two) times daily. 06/12/21  Yes [provider]  polyethylene glycol (MIRALAX / GLYCOLAX) 17 g packet Take 17 g by mouth daily.   Yes [provider]  potassium chloride SA (KLOR-CON) 20 MEQ tablet Take 20 mEq by mouth daily. 06/12/21  Yes [provider]  pregabalin (LYRICA) 100 MG capsule Take 50 mg by mouth 2 (two) times daily. For 1 week  Yes [provider]  senna (SENOKOT) 8.6 MG tablet Take 2 tablets by mouth daily.   Yes [provider]  sertraline (ZOLOFT) 100 MG tablet Take 100 mg by mouth daily. 10/12/21  Yes [provider]  spironolactone (ALDACTONE) 50 MG tablet Take 50 mg by mouth 2 (two) times daily. 10/13/21  Yes [provider]  thiamine 100 MG tablet Take 1 tablet (100 mg total) by mouth daily. 09/08/20  Yes Swayze, Ava, DO  traZODone (DESYREL) 150 MG tablet Take 150 mg by mouth at bedtime. 10/12/21  Yes [provider]  ursodiol (ACTIGALL) 250 MG tablet Take 1 tablet (250 mg total) by mouth 3 (three) times daily. 11/05/20  Yes Harvel Quale, MD  venlafaxine XR (EFFEXOR-XR) 75 MG 24 hr capsule Take 75 mg by mouth daily with breakfast.   Yes [provider]  vitamin B-12 1000 MCG tablet Take 1 tablet (1,000 mcg total) by mouth daily. 09/09/20  Yes Swayze, Ava, DO  XTAMPZA ER 36 MG C12A Take 1 capsule by mouth 2 (two) times daily. 07/04/21  Yes [provider]  acetaminophen (TYLENOL) 650 MG CR tablet Take 650 mg by mouth every 6 (six) hours as needed for pain.    [provider]  azelastine (ASTELIN) 0.1 % nasal spray Place into both nostrils. 10/12/21   [provider]  doxycycline (VIBRAMYCIN) 100 MG capsule Take 100 mg by mouth daily. Patient not taking: Reported on 11/13/2021 09/03/21   [provider]  Emollient (EUCERIN PLUS) 5-5 % LOTN Apply topically. Patient not taking: Reported on 11/09/2021    [provider]  fluticasone (FLONASE) 50 MCG/ACT nasal spray Place 1 spray into both nostrils daily. prn Patient not taking: Reported on 10/29/2021    [provider]  furosemide (LASIX) 40 MG tablet Take 40 mg by mouth daily. Patient not taking: Reported on 11/05/2021 10/12/21   [provider]  HYDROcodone-acetaminophen (NORCO/VICODIN) 5-325 MG tablet Take 1 tablet by mouth 2 (two) times daily as needed. Patient not taking: Reported on 10/28/2021 08/31/21   [provider]  loratadine (CLARITIN) 10 MG tablet Take 10 mg by mouth daily. Patient not taking: Reported on 10/30/2021    [provider]  nitroGLYCERIN (NITROSTAT) 0.4 MG SL tablet Place 0.4 mg under the tongue every 5 (five) minutes as needed for chest pain. Patient not taking: Reported on 11/06/2021    [provider]  sertraline (ZOLOFT) 50 MG tablet Take 150 mg by mouth daily. Patient not taking: Reported on 10/27/2021    [provider]  traZODone (DESYREL) 100 MG tablet Take 100 mg by mouth at bedtime. Patient not taking: Reported on 11/13/2021    [provider]  traZODone (DESYREL) 50 MG tablet Take 25 mg by mouth at bedtime as needed. Patient not taking: Reported on 11/17/2021 06/12/21   [provider]    Physical Exam: Vitals:   10/25/2021 1842 10/22/2021 1919 11/02/2021 1945 11/14/2021 2006  BP:      Pulse: (!) 129 (!) 128 (!) 129   Resp: (!) 28 (!) 31 (!) 30    Temp: 99.7 F (37.6 C)   98.7 F (37.1 C)  TempSrc: Axillary   Axillary  SpO2: 91% 92% (!) 89%   Weight:      Height:        Constitutional: On BiPAP, somnolent, opens eyes to voice, follows directions,  Vitals:   10/24/2021 1842 11/04/2021 1919 11/01/2021 1945 11/12/2021 2006  BP:  Pulse: (!) 129 (!) 128 (!) 129   Resp: (!) 28 (!) 31 (!) 30   Temp: 99.7 F (37.6 C)   98.7 F (37.1 C)  TempSrc: Axillary   Axillary  SpO2: 91% 92% (!) 89%   Weight:      Height:       Eyes: Pupils equal, lids and conjunctivae normal ENMT: Mucous membranes are dry  Neck: normal, supple, no masses, no thyromegaly Respiratory: On BiPAP, equal breath sounds bilaterally.    Cardiovascular: Tachycardic, regular.  No extremity edema. Abdomen: no tenderness, no masses palpated. No hepatosplenomegaly. Bowel sounds positive.  Musculoskeletal: no clubbing / cyanosis. No joint deformity upper and lower extremities. Good ROM, no contractures. Normal muscle tone.  Skin: no rashes, lesions, ulcers. No induration Neurologic:  Pupils equal, round able to move all extremities spontaneously. Psychiatric: Somnolent, but able to follow directions  Labs on Admission: I have personally reviewed following labs and imaging studies  CBC: Recent Labs  Lab 11/15/2021 1407  WBC 14.2*  NEUTROABS 13.1*  HGB 13.9  HCT 43.1  MCV 86.2  PLT 263*   Basic Metabolic Panel: Recent Labs  Lab 11/03/2021 1407  NA 126*  K 4.7  CL 93*  CO2 22  GLUCOSE 384*  BUN 27*  CREATININE 1.24*  CALCIUM 8.9   GFR: Estimated Creatinine Clearance: 51.8 mL/min (A) (by C-G formula based on SCr of 1.24 mg/dL (H)). Liver Function Tests: Recent Labs  Lab 10/29/2021 1407  AST 21  ALT 19  ALKPHOS 97  BILITOT 0.5  PROT 7.5  ALBUMIN 3.5   CBG: Recent Labs  Lab 10/23/2021 2004  GLUCAP 338*   Urine analysis:    Component Value Date/Time   COLORURINE YELLOW 11/12/2021 1836   APPEARANCEUR CLEAR 11/02/2021 1836   LABSPEC 1.020  10/27/2021 1836   PHURINE 6.0 10/31/2021 1836   GLUCOSEU 250 (A) 11/17/2021 1836   HGBUR TRACE (A) 11/15/2021 1836   BILIRUBINUR NEGATIVE 11/04/2021 1836   KETONESUR NEGATIVE 11/11/2021 1836   PROTEINUR 30 (A) 11/15/2021 1836   UROBILINOGEN 0.2 09/02/2015 1735   NITRITE NEGATIVE 11/15/2021 1836   LEUKOCYTESUR NEGATIVE 10/22/2021 1836    Radiological Exams on Admission: DG Chest Port 1 View  Result Date: 10/22/2021 CLINICAL DATA:  Shortness of breath and respiratory distress. EXAM: PORTABLE CHEST 1 VIEW COMPARISON:  08/28/2020 FINDINGS: Lungs are suboptimally inflated. Heart size appears enlarged. Aortic atherosclerosis. Airspace opacities identified within the right lung base and left midlung. Visualized osseous structures appear intact. IMPRESSION: 1. Opacities within the right lung base and left midlung. Findings suspicious for multifocal pneumonia. Followup PA and lateral chest X-ray is recommended in 3-4 weeks following trial of antibiotic therapy to ensure resolution and exclude underlying malignancy. 2. Cardiac enlargement. 3.  Aortic Atherosclerosis (ICD10-I70.0). Electronically Signed   By: Kerby Moors M.D.   On: 11/08/2021 14:37    EKG: Independently reviewed.  Ectopic atrial tachycardia rate, rate 132.  QTc 509.  Assessment/Plan Principal Problem:   Multifocal pneumonia Active Problems:   Acute metabolic encephalopathy   Sepsis ([place])   Influenza A   Hypothyroidism   Chronic pain   HTN (hypertension)   Adrenal insufficiency (HCC)   Morbid obesity (HCC)   Acute respiratory failure with hypoxia and hypercapnia (HCC)   Multifocal pneumonia 2/2 influenza infection with acute hypoxic and hypercarbic respiratory failure-chest x-ray showing right lung base and left midlung opacities suspicious for multifocal pneumonia.  Patient was transiently taken off BiPAP patient's O2 sats dropped to 82%.  ABG shows pH of 7.3.  PCO2 of 55, PO2 of 77. -Continue BiPAP, was initially not able  to tolerate BiPAP -Repeat ABG -Tamiflu -Check procalcitonin -Considering severity of respiratory failure, and presence of leukocytosis, will continue with CAP coverage ceftriaxone and doxycycline -prolonged QTC ( Vanc and aztreonam were given in the ED due to concerns for penicillin allergy).  SIRs-tachycardic to 130s, tachypneic 28-31, leukocytosis 14.2.  Likely driven by viral infection.  Blood pressure recordings of systolic in the 12Y likely spurious, on recheck systolic greater than 482.  Tachycardic, EKG suggests atrial tachycardia. -Check lactic acid- 2.9, trend - Obtain blood cultures, - Obtain UA, urine cultures -IV metoprolol 5 mg for heart rate greater than 120 with holding parameters. - 2 L bolus given, continue N/s 100cc/hr x 15 hrs  Acute metabolic encephalopathy-somnolent, likely due to viral infection, pneumonia, respiratory failure, SIRS.  No focal neurologic deficits. -  Antibiotics, IV fluids -NPO for now -Cautious with opioids for now, she is somnolent, but is requesting her pain medications for her chronic pain.  COPD exacerbation- with rhonchi, hypoxia and hypercarbia.  -DuoNebs as needed and scheduled -Dexamethasone 10 mg x 1 given, continue with IV Solu-Medrol 60 twice daily  Hyponatremia-126.  Baseline 136 - 138.  - Hydrate  Prolonged QTC-509.  Potassium 4.7.  She is on Zoloft.  EKGs from 08/2020- a year ago, showed prolonged QTC up to 500 - Check magnesium  Hypothyroidism-  -Hold Synthroid  Hypertension -Hold home metoprolol 12.5 mg twice daily in the setting of SIRS, and while n.p.o.  Liver cirrhosis- -Hold Lasix, spironolactone for now.   DM-random glucose 338. - Hold metformin - SSI- M - HgbA1c  Chronic pain -Hold Xtampza ER and home hydrocodone while n.p.o, somnolent  Chronic diastolic CHF.  Stable.  Last echo 07/2021 EF-60 to 65% with grade 2 diastolic dysfunction. -Hold Lasix  DVT prophylaxis: Lovenox Code Status: DNR- Universal DNR form  at bedside Family Communication: none at bedside Disposition Plan:  > 2 days.  Pending resolution of SIRS, and improvement in respiratory status. Consults called: None Admission status: Inpt , Step down I certify that at the point of admission it is my clinical judgment that the patient will require inpatient hospital care spanning beyond 2 midnights from the point of admission due to high intensity of service, high risk for further deterioration and high frequency of surveillance required. The following factors support the patient status of inpatient:    Bethena Roys MD [facility]  10/31/2021, 9:02 PM

## 2021-11-18 NOTE — Progress Notes (Signed)
Date and time results received: 11/12/2021 2056  Test: Lactic Acid  Critical Value: 2.9  Name of Provider Notified: Jenetta Downer, MD   Orders Received? Or Actions Taken?:  Given 1L bolus was still running during lactic draw, decision was made to continue to monitor and re-check lactic @ 2300.

## 2021-11-18 NOTE — Progress Notes (Signed)
Pharmacy Antibiotic Note  Deborah Russo is a 59 y.o. female admitted on 11/08/2021 with pneumonia.  Pharmacy has been consulted for Vancomycin dosing.  Plan: Vancomycin 2019m IV loading dose then 1500 mg IV Q 24 hrs. Goal AUC 400-550. Expected AUC: 492 SCr used: 1.24  F/U cxs and clinical progress Monitor V/S, labs and levels as indicated  Height: 5' 2"  (157.5 cm) Weight: 99 kg (218 lb 4.1 oz) IBW/kg (Calculated) : 50.1  Temp (24hrs), Avg:102.1 F (38.9 C), Min:102.1 F (38.9 C), Max:102.1 F (38.9 C)  Recent Labs  Lab 11/02/2021 1407  WBC 14.2*  CREATININE 1.24*    Estimated Creatinine Clearance: 51.8 mL/min (A) (by C-G formula based on SCr of 1.24 mg/dL (H)).    Allergies  Allergen Reactions   Penicillins Hives    *Tolerates zosyn Has patient had a PCN reaction causing immediate rash, facial/tongue/throat swelling, SOB or lightheadedness with hypotension: No Has patient had a PCN reaction causing severe rash involving mucus membranes or skin necrosis: No Has patient had a PCN reaction that required hospitalization No Has patient had a PCN reaction occurring within the last 10 years: No If all of the above answers are "NO", then may proceed with Cephalosporin use.    Sulfa Antibiotics Hives    Antimicrobials this admission: Vancomycin 11/30 >>  Aztreonam 2gm IV x 1 dose in ED, patient can take cephalosporins   Microbiology results: 11/30 BCx: pending 11/30 UCx: pending   MRSA PCR:   Thank you for allowing pharmacy to be a part of this patient's care.  LIsac Sarna BS PVena Austria BCaliforniaClinical Pharmacist Pager #[phone]/30/2022 3:27 PM

## 2021-11-19 ENCOUNTER — Inpatient Hospital Stay (HOSPITAL_COMMUNITY): Payer: Medicare Other

## 2021-11-19 DIAGNOSIS — I959 Hypotension, unspecified: Secondary | ICD-10-CM

## 2021-11-19 DIAGNOSIS — J9602 Acute respiratory failure with hypercapnia: Secondary | ICD-10-CM

## 2021-11-19 DIAGNOSIS — J9601 Acute respiratory failure with hypoxia: Secondary | ICD-10-CM

## 2021-11-19 LAB — CBC
HCT: 45.2 % (ref 36.0–46.0)
Hemoglobin: 14.1 g/dL (ref 12.0–15.0)
MCH: 26.9 pg (ref 26.0–34.0)
MCHC: 31.2 g/dL (ref 30.0–36.0)
MCV: 86.3 fL (ref 80.0–100.0)
Platelets: 166 10*3/uL (ref 150–400)
RBC: 5.24 MIL/uL — ABNORMAL HIGH (ref 3.87–5.11)
RDW: 14.9 % (ref 11.5–15.5)
WBC: 4.3 10*3/uL (ref 4.0–10.5)
nRBC: 0 % (ref 0.0–0.2)

## 2021-11-19 LAB — LEGIONELLA PNEUMOPHILA SEROGP 1 UR AG: L. pneumophila Serogp 1 Ur Ag: NEGATIVE

## 2021-11-19 LAB — BLOOD GAS, ARTERIAL
Acid-base deficit: 7.2 mmol/L — ABNORMAL HIGH (ref 0.0–2.0)
Bicarbonate: 18.2 mmol/L — ABNORMAL LOW (ref 20.0–28.0)
Drawn by: 21310
FIO2: 70
O2 Saturation: 86.1 %
Patient temperature: 38
pCO2 arterial: 42.5 mmHg (ref 32.0–48.0)
pH, Arterial: 7.265 — ABNORMAL LOW (ref 7.350–7.450)
pO2, Arterial: 61.4 mmHg — ABNORMAL LOW (ref 83.0–108.0)

## 2021-11-19 LAB — LACTIC ACID, PLASMA: Lactic Acid, Venous: 4.6 mmol/L (ref 0.5–1.9)

## 2021-11-19 LAB — BASIC METABOLIC PANEL
Anion gap: 11 (ref 5–15)
BUN: 30 mg/dL — ABNORMAL HIGH (ref 8–23)
CO2: 20 mmol/L — ABNORMAL LOW (ref 22–32)
Calcium: 8.2 mg/dL — ABNORMAL LOW (ref 8.9–10.3)
Chloride: 101 mmol/L (ref 98–111)
Creatinine, Ser: 1.43 mg/dL — ABNORMAL HIGH (ref 0.44–1.00)
GFR, Estimated: 41 mL/min — ABNORMAL LOW (ref >60)
Glucose, Bld: 304 mg/dL — ABNORMAL HIGH (ref 70–99)
Potassium: 4.4 mmol/L (ref 3.5–5.1)
Sodium: 132 mmol/L — ABNORMAL LOW (ref 135–145)

## 2021-11-19 LAB — GLUCOSE, CAPILLARY: Glucose-Capillary: 272 mg/dL — ABNORMAL HIGH (ref 70–99)

## 2021-11-19 LAB — HEMOGLOBIN A1C
Hgb A1c MFr Bld: 8.8 % — ABNORMAL HIGH (ref 4.8–5.6)
Mean Plasma Glucose: 205.86 mg/dL

## 2021-11-19 LAB — HIV ANTIBODY (ROUTINE TESTING W REFLEX): HIV Screen 4th Generation wRfx: NONREACTIVE

## 2021-11-19 MED ORDER — MORPHINE 100MG IN NS 100ML (1MG/ML) PREMIX INFUSION
1.0000 mg/h | INTRAVENOUS | Status: DC
  Administered 2021-11-19: 1 mg/h via INTRAVENOUS
  Filled 2021-11-19: qty 100

## 2021-11-19 MED ORDER — LORAZEPAM 2 MG/ML IJ SOLN
0.5000 mg | INTRAMUSCULAR | Status: DC | PRN
  Filled 2021-11-19: qty 1

## 2021-11-19 MED ORDER — MORPHINE SULFATE (PF) 2 MG/ML IV SOLN
1.0000 mg | Freq: Once | INTRAVENOUS | Status: AC
  Administered 2021-11-19: 1 mg via INTRAVENOUS
  Filled 2021-11-19: qty 1

## 2021-11-19 MED ORDER — FUROSEMIDE 10 MG/ML IJ SOLN
20.0000 mg | Freq: Once | INTRAMUSCULAR | Status: AC
  Administered 2021-11-19: 20 mg via INTRAVENOUS
  Filled 2021-11-19: qty 2

## 2021-11-19 MED ORDER — LORAZEPAM BOLUS VIA INFUSION: 0.5000 mg | INTRAVENOUS | Status: DC | PRN

## 2021-11-19 MED ORDER — LORAZEPAM 2 MG/ML IJ SOLN
0.5000 mg | INTRAMUSCULAR | Status: DC | PRN
  Administered 2021-11-19: 0.5 mg via INTRAVENOUS

## 2021-11-19 MED ORDER — LEVALBUTEROL HCL 0.63 MG/3ML IN NEBU
0.6300 mg | INHALATION_SOLUTION | RESPIRATORY_TRACT | Status: DC | PRN
  Administered 2021-11-19: 0.63 mg via RESPIRATORY_TRACT
  Filled 2021-11-19: qty 3

## 2021-11-19 MED ORDER — MORPHINE SULFATE (PF) 2 MG/ML IV SOLN
2.0000 mg | INTRAVENOUS | Status: DC | PRN
  Administered 2021-11-19: 2 mg via INTRAVENOUS
  Filled 2021-11-19: qty 1

## 2021-11-19 MED ORDER — SCOPOLAMINE 1 MG/3DAYS TD PT72
1.0000 | MEDICATED_PATCH | TRANSDERMAL | Status: DC
  Filled 2021-11-19 (×2): qty 1

## 2021-11-19 DEATH — deceased

## 2021-11-20 LAB — URINE CULTURE

## 2021-11-23 LAB — CULTURE, BLOOD (ROUTINE X 2)
Culture: NO GROWTH
Culture: NO GROWTH
Special Requests: ADEQUATE
Special Requests: ADEQUATE

## 2021-12-20 NOTE — ED Notes (Signed)
Hospitalist E. Emokpae aware that pt will not tolerate bipap and continues to pull bipap off. This nurse requested orders at this time for tylenol due to rectal temperature of 102.1 and pain medication per pt request.

## 2021-12-20 NOTE — Death Summary Note (Signed)
DEATH SUMMARY   Patient Details  Name: Deborah Russo MRN: [id number] DOB: 1961/11/13  Admission/Discharge Information   Admit Date:  07-Dec-2021  Date of Death: Date of Death: 12/08/21  Time of Death: Time of Death: 0548  Length of Stay: 1  Referring Physician: Baruch Gouty, FNP   Reason(s) for Hospitalization  Altered mental status  Diagnoses  Preliminary cause of death:  Secondary Diagnoses (including complications and co-morbidities):  Principal Problem:   Multifocal pneumonia Active Problems:   Acute metabolic encephalopathy   Sepsis ([place])   Influenza A   Hypothyroidism   Chronic pain   HTN (hypertension)   Adrenal insufficiency ([place])   Morbid obesity ([place])   Acute respiratory failure with hypoxia and hypercapnia [facility]   Brief Hospital Course (including significant findings, care, treatment, and services provided and events leading to death)  Deborah Russo is a 61 y.o. year old female with medical history significant for adrenal insufficiency, hypertension, liver cirrhosis, COPD, OSA, stroke.  Patient was brought to the ED from [facility] with reports of altered mental status.  EMS reports that patient's O2 sats was 78% on 4 L, and she was placed on nonrebreather with sats improving to 91%.  Per chart review, patient had been dyspneic for about 2 days, with altered mental status and sleepiness.  At the time of my evaluation, patient is somnolent, would open her eyes to touch, she is on BiPAP.  Prior to my evaluation, patient's mental status had improved, was complaining of pain and requesting pain medications. In the ED - patient was placed on BiPAP with sats 91 to 95% . Febrile 102.1.  Tachycardic heart rate 130s.  Respiratory rate 26-33.  WBC 14.2.  Sodium 126.  BNP 177.  Chest x-ray shows multifocal pneumonia.  Influenza test positive.  pH 7.2, PCO2 55.  PO2 77.  EKG showing ectopic atrial tachycardia.  Patient was started on ceftriaxone and azithromycin,  also concerning for COPD exacerbation with rhonchi present per ED provider notes.    Patient was admitted with a diagnosis of multifocal pneumonia 2/2 influenza infection, superimposed bacterial infection, with acute hypoxic and hypercarbic respiratory failure and Severe sepsis-   meeting SIRS criteria, with lactic acidosis 2.9 > 4.1 and evidence of endorgan dysfunction-respiratory failure, encephalopathy and later hypotension.  Patient's O2 sats 82% on room air.  ABG shows pH of 7.3.  PCO2 of 55, PO2 of 77.  With leukocytosis of 14.2, and elevated procalcitonin of 5.23, concern for bacterial super imposed on viral infection.  Patient was continued on antibiotics, with CAP coverage with ceftriaxone and doxycycline.  Started on Tamiflu.  1 L bolus was given, with worsening respiratory status chest x-ray was repeated and showed worsening bilateral pulmonary infiltrates, cardiomegaly with vascular congestion.  IV Lasix 20 mg x 1 was given.  Patient became hypotensive, was started on peripheral norepinephrine. PCCM was consulted remotely, optimizing patient's BiPAP settings, and encouraged goals of care discussion regarding comfort care.  Family was called, patient's daughter and daughter's husband were at bedside.  Decision was made to honor patient's DNR wishes, patient was made comfort care and BiPAP was taken off, pressors were discontinued.  Patient passed peacefully with daughter and son-in-law at bedside.     Pertinent Labs and Studies  Significant Diagnostic Studies DG CHEST PORT 1 VIEW  Result Date: 12-08-21 CLINICAL DATA:  Shortness of breath. EXAM: PORTABLE CHEST 1 VIEW COMPARISON:  Chest radiograph dated 12-07-2021. FINDINGS: Cardiomegaly with vascular congestion. Bilateral lower lobe predominant  airspace opacities likely combination of pleural effusions and associated infiltrate and worsened since the prior radiograph. Scattered confluent airspace opacities in the upper lung field are new or  worsened since the prior radiograph. No pneumothorax. Stable cardiomediastinal silhouette. No acute osseous pathology. IMPRESSION: 1. Worsening bilateral pulmonary infiltrates. 2. Cardiomegaly with vascular congestion. Electronically Signed   By: Anner Crete M.D.   On: December 12, 2021 01:02   DG Chest Port 1 View  Result Date: 11/01/2021 CLINICAL DATA:  Shortness of breath and respiratory distress. EXAM: PORTABLE CHEST 1 VIEW COMPARISON:  08/28/2020 FINDINGS: Lungs are suboptimally inflated. Heart size appears enlarged. Aortic atherosclerosis. Airspace opacities identified within the right lung base and left midlung. Visualized osseous structures appear intact. IMPRESSION: 1. Opacities within the right lung base and left midlung. Findings suspicious for multifocal pneumonia. Followup PA and lateral chest X-ray is recommended in 3-4 weeks following trial of antibiotic therapy to ensure resolution and exclude underlying malignancy. 2. Cardiac enlargement. 3.  Aortic Atherosclerosis (ICD10-I70.0). Electronically Signed   By: Kerby Moors M.D.   On: 11/16/2021 14:37    Microbiology Recent Results (from the past 240 hour(s))  MRSA Next Gen by PCR, Nasal     Status: Abnormal   Collection Time: 11/14/2021  3:38 PM   Specimen: Nasal Mucosa; Nasal Swab  Result Value Ref Range Status   MRSA by PCR Next Gen DETECTED (A) NOT DETECTED Final    Comment: RESULT CALLED TO, READ BACK BY AND VERIFIED WITH: LOOMIS,K AT 1808 ON 11.30.22 BY RUCINSKI, B (NOTE) The GeneXpert MRSA Assay (FDA approved for NASAL specimens only), is one component of a comprehensive MRSA colonization surveillance program. It is not intended to diagnose MRSA infection nor to guide or monitor treatment for MRSA infections. Test performance is not FDA approved in patients less than 15 years old. Performed at [facility], [address], [place], [place] [address]   Resp Panel by RT-PCR (Flu A&B, Covid)     Status: Abnormal   Collection  Time: 10/31/2021  6:10 PM  Result Value Ref Range Status   SARS Coronavirus 2 by RT PCR NEGATIVE NEGATIVE Final    Comment: (NOTE) SARS-CoV-2 target nucleic acids are NOT DETECTED.  The SARS-CoV-2 RNA is generally detectable in upper respiratory specimens during the acute phase of infection. The lowest concentration of SARS-CoV-2 viral copies this assay can detect is 138 copies/mL. A negative result does not preclude SARS-Cov-2 infection and should not be used as the sole basis for treatment or other patient management decisions. A negative result may occur with  improper specimen collection/handling, submission of specimen other than nasopharyngeal swab, presence of viral mutation(s) within the areas targeted by this assay, and inadequate number of viral copies(<138 copies/mL). A negative result must be combined with clinical observations, patient history, and epidemiological information. The expected result is Negative.  Fact Sheet for Patients:  EntrepreneurPulse.com.au  Fact Sheet for Healthcare Providers:  IncredibleEmployment.be  This test is no t yet approved or cleared by the [place] FDA and  has been authorized for detection and/or diagnosis of SARS-CoV-2 by FDA under an Emergency Use Authorization (EUA). This EUA will remain  in effect (meaning this test can be used) for the duration of the COVID-19 declaration under Section 564(b)(1) of the Act, 21 U.S.C.section 360bbb-3(b)(1), unless the authorization is terminated  or revoked sooner.       Influenza A by PCR POSITIVE (A) NEGATIVE Final   Influenza B by PCR NEGATIVE NEGATIVE Final    Comment: (  NOTE) The Xpert Xpress SARS-CoV-2/FLU/RSV plus assay is intended as an aid in the diagnosis of influenza from Nasopharyngeal swab specimens and should not be used as a sole basis for treatment. Nasal washings and aspirates are unacceptable for Xpert Xpress  SARS-CoV-2/FLU/RSV testing.  Fact Sheet for Patients: EntrepreneurPulse.com.au  Fact Sheet for Healthcare Providers: IncredibleEmployment.be  This test is not yet approved or cleared by the [place] FDA and has been authorized for detection and/or diagnosis of SARS-CoV-2 by FDA under an Emergency Use Authorization (EUA). This EUA will remain in effect (meaning this test can be used) for the duration of the COVID-19 declaration under Section 564(b)(1) of the Act, 21 U.S.C. section 360bbb-3(b)(1), unless the authorization is terminated or revoked.  Performed at [facility], [address] [place], [place] [address]   Culture, blood (routine x 2)     Status: None (Preliminary result)   Collection Time: 10/21/2021  7:03 PM   Specimen: BLOOD RIGHT HAND  Result Value Ref Range Status   Specimen Description BLOOD RIGHT HAND  Final   Special Requests   Final    BOTTLES DRAWN AEROBIC AND ANAEROBIC Blood Culture adequate volume   Culture   Final    NO GROWTH < 12 HOURS Performed at [facility], [address], [place], [place] [address]    Report Status PENDING  Incomplete  Culture, blood (routine x 2)     Status: None (Preliminary result)   Collection Time: 11/06/2021  7:03 PM   Specimen: BLOOD LEFT HAND  Result Value Ref Range Status   Specimen Description BLOOD LEFT HAND  Final   Special Requests   Final    BOTTLES DRAWN AEROBIC AND ANAEROBIC Blood Culture adequate volume   Culture   Final    NO GROWTH < 12 HOURS Performed at [facility], [address], [place], [place] [address]    Report Status PENDING  Incomplete    Lab Basic Metabolic Panel: Recent Labs  Lab 11/17/2021 1407 11/08/2021 1918 11-29-2021 0136  NA 126*  --  132*  K 4.7  --  4.4  CL 93*  --  101  CO2 22  --  20*  GLUCOSE 384*  --  304*  BUN 27*  --  30*  CREATININE 1.24*  --  1.43*  CALCIUM 8.9  --  8.2*  MG  --  1.5*  --    Liver Function Tests: Recent Labs  Lab  11/08/2021 1407  AST 21  ALT 19  ALKPHOS 97  BILITOT 0.5  PROT 7.5  ALBUMIN 3.5    CBC: Recent Labs  Lab 10/26/2021 1407 Nov 29, 2021 0136  WBC 14.2* 4.3  NEUTROABS 13.1*  --   HGB 13.9 14.1  HCT 43.1 45.2  MCV 86.2 86.3  PLT 146* 166   Sepsis Labs: Recent Labs  Lab 10/24/2021 1407 11/07/2021 1903 10/30/2021 1918 11/06/2021 2311 29-Nov-2021 0136  PROCALCITON  --   --  5.23  --   --   WBC 14.2*  --   --   --  4.3  LATICACIDVEN  --  2.9*  --  4.1* 4.6*    Procedures/Operations  None   Katasha Riga E Jett Kulzer 29-Nov-2021, 5:26 PM

## 2021-12-20 NOTE — Progress Notes (Signed)
Date and time results received: 12/15/21 2342  Test: Lactic Acid Critical Value: 4.1  Name of Provider Notified: Laurey Arrow, MD   Orders Received? Or Actions Taken?:  Awaiting CXR results, continue to trend lactic.

## 2021-12-20 NOTE — Progress Notes (Signed)
Pt transitioned to comfort care @ approx 0500 after pt continued to have respiratory and circulatory decompensation throughout the evening. This decision was made in accordance with the pt, her daughter, and hospitalist at bedside. Pt was taken off of BiPap and placed on nasal canula for comfort as well as being started on morphine gtt. Pt passed @ 0539 w/ family (daughter/Son-in-law) present. Pt died comfortably and was in no apparent distress. Family grieved appropriately at bedside before departing, all questions answered to their satisfaction.

## 2021-12-20 NOTE — ED Notes (Signed)
This nurse educated pt on need for bipap and asked if she would be willing to try it again due to work of breathing. Pt stated "no I don't want it I can't breathe with that on." Non rebreather remains in place.

## 2021-12-20 NOTE — Progress Notes (Addendum)
CRITICAL CARE NOTE  RN called due to patient having increased work of breathing.  She was admitted earlier in the evening yesterday due to multifocal pneumonia secondary to influenza and respiratory failure with hypoxia and hypercapnia.  Chest x-ray done on admission was suspicious for multifocal pneumonia. Patient was placed on BiPAP but she was still using accessory muscles to improve.  Patient was septic on admission.  BP did not respond to IV fluid and peripheral Levophed was started. Chest x-ray was repeated and showed worsening bilateral pulmonary infiltrates, cardiomegaly with vascular congestion. IV Lasix 20 mg x 1 was ordered to be given as directed by BP.  IV fluid was temporarily held. Patient continues to have increased work of breathing, she was tachycardic with HR in the 140s which impedes treating patient with DuoNeb, Xopenex was indicated. PCCM at [facility] was consulted and advised to optimize patient's BiPAP settings. It was also encouraged to discuss goals of care regarding comfort care with family considering a CODE STATUS being DNR and not being able to intubate.  Physical exam: BP (!) 110/47   Pulse (!) 146   Temp (!) 100.6 F (38.1 C) (Axillary)   Resp (!) 37   Ht 5' 2"  (1.575 m)   Wt 99 kg   SpO2 96%   BMI 39.92 kg/m   Gen:- Awake Alert, but in acute respiratory distress HEENT:- [place].AT, No sclera icterus Neck-Supple Neck,No JVD,.  Lungs-tachypnea.  CTAB and on BiPAP CV-tachycardia.  S1, S2 normal Abd-  +ve B.Sounds, Abd Soft, No tenderness,    Extremity/Skin:- No  edema,    Psych-affect is appropriate, oriented x3 Neuro-no new focal deficits, no tremors  Patient continues to use accessory muscles while on BiPAP and was in acute respiratory distress  Assessment and plan: Acute respiratory failure with hypoxia and hypercapnia secondary to multifocal pneumonia with superimposed influenza Patient continues to be in respiratory distress and continue to have  increased work of breathing with use of accessory muscles despite being on BiPAP PCCM was consulted and suggested optimizing BiPAP settings (this was done), patient continues to be in acute distress. Patient's daughter and daughter's husband was invited to bedside to explain patient's condition and poor prognosis.  ED physician was invited to bedside, daughter initially states that patient wants to change status to full code, ED physician was invited to bedside and in weakness of RN, daughter and this physician, patient decided to maintain her DNR status and wanted the BiPAP to be taken off and to be made comfortable. Patient's status was changed to comfort care and was placed on comfort care measures Palliative care will be consulted and we shall await further recommendation.  Hypotension in the setting of above Patient was initially started on peripheral IV Levophed.  This was discontinued after patient's status changed to comfort care.   Critical time: 72 minutes   Critical care personally provided  managing the patient due to high probability of clinically significant and life threatening deterioration. This critical care time included obtaining a history; examining the patient, pulse oximetry; ordering and review of studies; arranging urgent treatment with development of a management plan; evaluation of patient's response of treatment; frequent reassessment; and discussions with other providers.  This critical care time was performed to assess and manage the high probability of imminent and life threatening deterioration that could result in multi-organ failure.

## 2021-12-20 NOTE — Progress Notes (Signed)
Pt taken off BIPAP and placed on 2lpm cann, family at bedside and nurse in room, pt on Morphine drip

## 2021-12-20 DEATH — deceased

## 2023-05-09 NOTE — Telephone Encounter (Signed)
Erroneous encounter will close.
